# Patient Record
Sex: Male | Born: 1960 | Race: White | Hispanic: No | Smoking: Former smoker
Health system: Southern US, Community
[De-identification: ages and names within clinical notes are randomized; demographics above are authoritative.]

## PROBLEM LIST (undated history)

## (undated) DIAGNOSIS — R51 Headache: Secondary | ICD-10-CM

## (undated) DIAGNOSIS — I639 Cerebral infarction, unspecified: Secondary | ICD-10-CM

## (undated) DIAGNOSIS — M109 Gout, unspecified: Secondary | ICD-10-CM

## (undated) DIAGNOSIS — E119 Type 2 diabetes mellitus without complications: Secondary | ICD-10-CM

## (undated) DIAGNOSIS — R0600 Dyspnea, unspecified: Secondary | ICD-10-CM

## (undated) DIAGNOSIS — E039 Hypothyroidism, unspecified: Secondary | ICD-10-CM

## (undated) DIAGNOSIS — I10 Essential (primary) hypertension: Secondary | ICD-10-CM

## (undated) DIAGNOSIS — I749 Embolism and thrombosis of unspecified artery: Secondary | ICD-10-CM

## (undated) DIAGNOSIS — T8859XA Other complications of anesthesia, initial encounter: Secondary | ICD-10-CM

## (undated) DIAGNOSIS — I219 Acute myocardial infarction, unspecified: Secondary | ICD-10-CM

## (undated) DIAGNOSIS — M1990 Unspecified osteoarthritis, unspecified site: Secondary | ICD-10-CM

## (undated) DIAGNOSIS — Z9989 Dependence on other enabling machines and devices: Secondary | ICD-10-CM

## (undated) DIAGNOSIS — R519 Headache, unspecified: Secondary | ICD-10-CM

## (undated) DIAGNOSIS — G4733 Obstructive sleep apnea (adult) (pediatric): Secondary | ICD-10-CM

## (undated) DIAGNOSIS — I739 Peripheral vascular disease, unspecified: Secondary | ICD-10-CM

## (undated) DIAGNOSIS — I2699 Other pulmonary embolism without acute cor pulmonale: Secondary | ICD-10-CM

## (undated) DIAGNOSIS — T4145XA Adverse effect of unspecified anesthetic, initial encounter: Secondary | ICD-10-CM

## (undated) DIAGNOSIS — J40 Bronchitis, not specified as acute or chronic: Secondary | ICD-10-CM

## (undated) HISTORY — PX: SKIN GRAFT: SHX250

## (undated) HISTORY — PX: ROTATOR CUFF REPAIR: SHX139

---

## 2006-06-29 ENCOUNTER — Encounter: Payer: Self-pay | Admitting: Gastroenterology

## 2006-06-30 ENCOUNTER — Encounter: Payer: Self-pay | Admitting: Gastroenterology

## 2006-07-12 ENCOUNTER — Encounter: Payer: Self-pay | Admitting: Gastroenterology

## 2008-05-16 HISTORY — PX: IVC FILTER INSERTION: CATH118245

## 2009-03-06 ENCOUNTER — Encounter: Payer: Self-pay | Admitting: Gastroenterology

## 2009-07-01 ENCOUNTER — Encounter: Payer: Self-pay | Admitting: Gastroenterology

## 2009-07-20 ENCOUNTER — Encounter: Payer: Self-pay | Admitting: Gastroenterology

## 2009-07-30 ENCOUNTER — Encounter: Payer: Self-pay | Admitting: Gastroenterology

## 2009-08-13 ENCOUNTER — Encounter: Payer: Self-pay | Admitting: Gastroenterology

## 2009-08-17 ENCOUNTER — Encounter: Payer: Self-pay | Admitting: Gastroenterology

## 2009-08-20 ENCOUNTER — Encounter: Payer: Self-pay | Admitting: Gastroenterology

## 2009-08-24 ENCOUNTER — Encounter: Payer: Self-pay | Admitting: Gastroenterology

## 2009-08-24 DIAGNOSIS — R1319 Other dysphagia: Secondary | ICD-10-CM | POA: Insufficient documentation

## 2009-09-01 ENCOUNTER — Encounter: Payer: Self-pay | Admitting: Gastroenterology

## 2009-09-01 ENCOUNTER — Ambulatory Visit (HOSPITAL_COMMUNITY): Admission: RE | Admit: 2009-09-01 | Discharge: 2009-09-01 | Payer: Self-pay | Admitting: Gastroenterology

## 2009-09-02 ENCOUNTER — Encounter: Payer: Self-pay | Admitting: Gastroenterology

## 2009-09-10 ENCOUNTER — Ambulatory Visit: Payer: Self-pay | Admitting: Cardiology

## 2009-09-14 DIAGNOSIS — E049 Nontoxic goiter, unspecified: Secondary | ICD-10-CM

## 2009-09-14 DIAGNOSIS — R933 Abnormal findings on diagnostic imaging of other parts of digestive tract: Secondary | ICD-10-CM | POA: Insufficient documentation

## 2009-09-17 ENCOUNTER — Ambulatory Visit (HOSPITAL_COMMUNITY): Admission: RE | Admit: 2009-09-17 | Discharge: 2009-09-17 | Payer: Self-pay | Admitting: Gastroenterology

## 2009-09-17 ENCOUNTER — Ambulatory Visit: Payer: Self-pay | Admitting: Gastroenterology

## 2009-09-18 ENCOUNTER — Encounter: Payer: Self-pay | Admitting: Gastroenterology

## 2009-10-06 ENCOUNTER — Encounter: Payer: Self-pay | Admitting: Gastroenterology

## 2009-11-25 ENCOUNTER — Encounter: Payer: Self-pay | Admitting: Gastroenterology

## 2010-02-02 ENCOUNTER — Ambulatory Visit (HOSPITAL_COMMUNITY): Admission: RE | Admit: 2010-02-02 | Discharge: 2010-02-02 | Payer: Self-pay | Admitting: Surgery

## 2010-02-03 ENCOUNTER — Inpatient Hospital Stay (HOSPITAL_COMMUNITY): Admission: RE | Admit: 2010-02-03 | Discharge: 2010-02-06 | Payer: Self-pay | Admitting: Surgery

## 2010-03-02 ENCOUNTER — Ambulatory Visit (HOSPITAL_COMMUNITY): Admission: RE | Admit: 2010-03-02 | Discharge: 2010-03-02 | Payer: Self-pay | Admitting: Surgery

## 2010-05-16 DIAGNOSIS — I219 Acute myocardial infarction, unspecified: Secondary | ICD-10-CM

## 2010-05-16 HISTORY — DX: Acute myocardial infarction, unspecified: I21.9

## 2010-05-31 ENCOUNTER — Inpatient Hospital Stay (HOSPITAL_COMMUNITY)
Admission: EM | Admit: 2010-05-31 | Discharge: 2010-06-03 | Payer: Self-pay | Attending: Internal Medicine | Admitting: Internal Medicine

## 2010-06-01 ENCOUNTER — Encounter (INDEPENDENT_AMBULATORY_CARE_PROVIDER_SITE_OTHER): Payer: Self-pay | Admitting: Internal Medicine

## 2010-06-02 LAB — LIPID PANEL
Cholesterol: 76 mg/dL (ref 0–200)
HDL: 29 mg/dL — ABNORMAL LOW (ref >39)
LDL Cholesterol: 28 mg/dL (ref 0–99)
Total CHOL/HDL Ratio: 2.6 RATIO
Triglycerides: 94 mg/dL (ref <150)
VLDL: 19 mg/dL (ref 0–40)

## 2010-06-02 LAB — CBC
HCT: 39.9 % (ref 39.0–52.0)
HCT: 39.5 % (ref 39.0–52.0)
Hemoglobin: 13.2 g/dL (ref 13.0–17.0)
Hemoglobin: 13.4 g/dL (ref 13.0–17.0)
MCH: 31 pg (ref 26.0–34.0)
MCH: 31.5 pg (ref 26.0–34.0)
MCHC: 33.1 g/dL (ref 30.0–36.0)
MCHC: 33.9 g/dL (ref 30.0–36.0)
MCV: 93.7 fL (ref 78.0–100.0)
MCV: 92.7 fL (ref 78.0–100.0)
Platelets: 124 10*3/uL — ABNORMAL LOW (ref 150–400)
Platelets: 135 10*3/uL — ABNORMAL LOW (ref 150–400)
RBC: 4.26 MIL/uL (ref 4.22–5.81)
RBC: 4.26 MIL/uL (ref 4.22–5.81)
RDW: 13.8 % (ref 11.5–15.5)
RDW: 13.8 % (ref 11.5–15.5)
WBC: 10.1 10*3/uL (ref 4.0–10.5)
WBC: 9.3 10*3/uL (ref 4.0–10.5)

## 2010-06-02 LAB — BLOOD GAS, ARTERIAL
Acid-Base Excess: 1.5 mmol/L (ref 0.0–2.0)
Bicarbonate: 25.5 mEq/L — ABNORMAL HIGH (ref 20.0–24.0)
Drawn by: 318431
O2 Content: 6 L/min
O2 Saturation: 98.1 %
Patient temperature: 98.6
TCO2: 26.8 mmol/L (ref 0–100)
pCO2 arterial: 40.5 mmHg (ref 35.0–45.0)
pH, Arterial: 7.416 (ref 7.350–7.450)
pO2, Arterial: 102 mmHg — ABNORMAL HIGH (ref 80.0–100.0)

## 2010-06-02 LAB — COMPREHENSIVE METABOLIC PANEL
ALT: 24 U/L (ref 0–53)
AST: 21 U/L (ref 0–37)
Albumin: 3.3 g/dL — ABNORMAL LOW (ref 3.5–5.2)
Alkaline Phosphatase: 61 U/L (ref 39–117)
BUN: 10 mg/dL (ref 6–23)
CO2: 24 mEq/L (ref 19–32)
Calcium: 8.5 mg/dL (ref 8.4–10.5)
Chloride: 107 mEq/L (ref 96–112)
Creatinine, Ser: 0.9 mg/dL (ref 0.4–1.5)
GFR calc Af Amer: >60 mL/min (ref >60)
GFR calc non Af Amer: >60 mL/min (ref >60)
Glucose, Bld: 93 mg/dL (ref 70–99)
Potassium: 4.3 mEq/L (ref 3.5–5.1)
Sodium: 140 mEq/L (ref 135–145)
Total Bilirubin: 0.5 mg/dL (ref 0.3–1.2)
Total Protein: 6.1 g/dL (ref 6.0–8.3)

## 2010-06-02 LAB — HEPARIN LEVEL (UNFRACTIONATED)
Heparin Unfractionated: 0.37 IU/mL (ref 0.30–0.70)
Heparin Unfractionated: 0.25 IU/mL — ABNORMAL LOW (ref 0.30–0.70)

## 2010-06-02 LAB — CARDIAC PANEL(CRET KIN+CKTOT+MB+TROPI)
CK, MB: 1.8 ng/mL (ref 0.3–4.0)
Relative Index: INVALID (ref 0.0–2.5)
Total CK: 50 U/L (ref 7–232)
Troponin I: 0.18 ng/mL — ABNORMAL HIGH (ref 0.00–0.06)

## 2010-06-02 LAB — PROTIME-INR
INR: 1.65 — ABNORMAL HIGH (ref 0.00–1.49)
INR: 1.6 — ABNORMAL HIGH (ref 0.00–1.49)
Prothrombin Time: 19.7 seconds — ABNORMAL HIGH (ref 11.6–15.2)
Prothrombin Time: 19.2 seconds — ABNORMAL HIGH (ref 11.6–15.2)

## 2010-06-02 LAB — MRSA PCR SCREENING: MRSA by PCR: NEGATIVE

## 2010-06-02 LAB — TSH: TSH: 8.188 u[IU]/mL — ABNORMAL HIGH (ref 0.350–4.500)

## 2010-06-07 ENCOUNTER — Telehealth (INDEPENDENT_AMBULATORY_CARE_PROVIDER_SITE_OTHER): Payer: Self-pay | Admitting: *Deleted

## 2010-06-07 LAB — BASIC METABOLIC PANEL
BUN: 11 mg/dL (ref 6–23)
CO2: 27 mEq/L (ref 19–32)
Calcium: 9.1 mg/dL (ref 8.4–10.5)
Chloride: 103 mEq/L (ref 96–112)
Creatinine, Ser: 1 mg/dL (ref 0.4–1.5)
GFR calc Af Amer: >60 mL/min (ref >60)
GFR calc non Af Amer: >60 mL/min (ref >60)
Glucose, Bld: 112 mg/dL — ABNORMAL HIGH (ref 70–99)
Potassium: 4.1 mEq/L (ref 3.5–5.1)
Sodium: 142 mEq/L (ref 135–145)

## 2010-06-07 LAB — CBC
HCT: 39.2 % (ref 39.0–52.0)
Hemoglobin: 13.2 g/dL (ref 13.0–17.0)
MCH: 30.9 pg (ref 26.0–34.0)
MCHC: 33.7 g/dL (ref 30.0–36.0)
MCV: 91.8 fL (ref 78.0–100.0)
Platelets: 141 10*3/uL — ABNORMAL LOW (ref 150–400)
RBC: 4.27 MIL/uL (ref 4.22–5.81)
RDW: 13.6 % (ref 11.5–15.5)
WBC: 9.2 10*3/uL (ref 4.0–10.5)

## 2010-06-07 LAB — HEPARIN LEVEL (UNFRACTIONATED)
Heparin Unfractionated: 0.38 IU/mL (ref 0.30–0.70)
Heparin Unfractionated: 0.74 IU/mL — ABNORMAL HIGH (ref 0.30–0.70)

## 2010-06-07 LAB — PROTIME-INR
INR: 1.65 — ABNORMAL HIGH (ref 0.00–1.49)
INR: 2.24 — ABNORMAL HIGH (ref 0.00–1.49)
Prothrombin Time: 19.7 seconds — ABNORMAL HIGH (ref 11.6–15.2)
Prothrombin Time: 24.9 seconds — ABNORMAL HIGH (ref 11.6–15.2)

## 2010-06-07 NOTE — Discharge Summary (Addendum)
NAMEDWAYNE, BULKLEY                ACCOUNT NO.:  [id number]  MEDICAL RECORD NO.:  [id number]          PATIENT TYPE:  INP  LOCATION:  2009                         FACILITY:  [facility]  PHYSICIAN:  Tarry Kos, MD       DATE OF BIRTH:  1960-08-28  DATE OF ADMISSION:  05/31/2010 DATE OF DISCHARGE:  06/03/2010                              DISCHARGE SUMMARY   DISCHARGE DIAGNOSES: 1. Acute bilateral pulmonary emboli. 2. Obstructive sleep apnea, continuous positive airway pressure     dependent. 3. History of multiple ventricular tachycardias in the past since the     age of 50 years old. 4. Medical noncompliance. 5. Morbid obesity.  HOSPITAL COURSE:  Mr. 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  He was transferred here to be evaluated by Pulmonology and Hematology; however, his INR was subtherapeutic on admission at 1.5, he occasionally skips his Coumadin dosing.  Also, another issues that he did not his INR monitored for several months due to not being able to afford his $20 co-pay with lab draws.  I have stressed the importance to Mr. Araque and his wife that he should never skip a Coumadin dose and also the importance of him following up with the INR checks.  Once his dosing is optimized, he should only need mostly INR checks.  However, he recently had surgery and has had follow up with other physician and he has been having to pay multiple $20 co- pays, which he does not been able to afford, and so he just quit getting INR check, because that is also a $20 co-pay.  He normally alternates 20 mg of Coumadin with 15 mg of Coumadin.  This has been resumed here.  He was initially placed on heparin drip.  This has been converted over to Lovenox.  He is familiar with Lovenox injection.  He has been stable. He has been  weaned off of oxygen.  He does have chronic respiratory rate failure.  He has got obstructive sleep apnea, requires CPAP at night.  PHYSICAL EXAMINATION:  VITAL SIGNS:  He has been afebrile.  His vital signs have been stable. GENERAL:  His 2-D echo did not reveal any right heart strain.  He is alert and oriented x4, in no apparent distress constantly. CARDIAC:  Regular rate and rhythm without murmur or rub. CHEST:  Clear to auscultation bilaterally.  No rhonchi, wheeze, or rales. ABDOMEN:  Soft, nontender, and nondistended.  Positive bowel sounds.  No hepatosplenomegaly. EXTREMITIES:  No clubbing, cyanosis, or edema. PSYCH:  Normal affect. NEURO:  No focal neurologic deficits.  He is being discharged home.  He is to get his INR checked with his primary care physician in [place].  He is to get his INR checked tomorrow and make followup appointment with his primary care physician either tomorrow or Monday.  He is to continue his Lovenox full dosing until his INR is between 2 and 3 for 2 consecutive  days, and today is 2.6.  He will need his INR checked tomorrow, if it is between 2 and 3. Tomorrow can be his last day of Lovenox.  He will be following up with Pulmonology within next month.  Follow up again with his primary care physician and he will also need followup with Heme/Onc in the next 1-2 months.  However, I am not sure how much would be left offer.  Mr. Calandra due to the fact that this is probably the cause of some medical noncompliance.  DISCHARGE MEDICATIONS:  He is being discharged on the following medications; 1. Aspirin 81 mg a day. 2. Percocet 5/325 mg 1-2 every 6 hours as needed for pain. 3. Lovenox 160 mg subcu twice a day until his INR is between 2-3 for     two consecutive days while on Coumadin. 4. Coumadin 20 mg alternating with 15 mg, yesterday he received 20 mg,     today he received 15 mg. 5. Allopurinol 300 mg daily. 6. Meloxicam 15 mg daily as needed. 7.  Ranitidine 300 mg twice a day. 8. Fexofenadine 180 mg q.p.m. 9. Colchicine 0.6 mg q.3 hours as needed. 10.HCTZ 25 mg daily. 11.Ibuprofen 800 mg 2 times a day as needed. 12.Albuterol neb inhaler every 4 hours as needed. 13.Synthroid 100 mcg daily. 14.Nexium 40 mg twice a day. 15.ReQuip 2 mg at bedtime.  His INR today is 2.24, INR yesterday was 1.65.  INR on June 01, 2010, was 1.6.  Again INR checked tomorrow.          ______________________________ Tarry Kos, MD     RD/MEDQ  D:  06/03/2010  T:  06/04/2010  Job:  [id number]  Electronically Signed by Eldridge Dace MD on 06/07/2010 01:56:47 PM

## 2010-06-15 NOTE — Miscellaneous (Signed)
Summary: [facility] Referral  Clinical Lists Changes  Orders: Added new Test order of [facility] ([facility]) - Signed

## 2010-06-15 NOTE — Letter (Signed)
Summary: New Encounter/Doug Shiflett MD  New Encounter/Doug Shiflett MD   Imported By: Sherian Rein 09/01/2009 14:00:29  _____________________________________________________________________  External Attachment:    Type:   Image     Comment:   External Document

## 2010-06-15 NOTE — Procedures (Signed)
Summary: EGD/Dough Shiflett MD  EGD/Dough Shiflett MD   Imported By: Sherian Rein 09/01/2009 13:53:55  _____________________________________________________________________  External Attachment:    Type:   Image     Comment:   External Document

## 2010-06-15 NOTE — Medication Information (Signed)
Summary: Esophagram/Douglas Ricky Stabs MD  Esophagram/Douglas Ricky Stabs MD   Imported By: Lester Burke 09/16/2009 10:50:47  _____________________________________________________________________  External Attachment:    Type:   Image     Comment:   External Document

## 2010-06-15 NOTE — Procedures (Signed)
Summary: EGD/Doug Shiflett MD  EGD/Doug Shiflett MD   Imported By: Sherian Rein 09/01/2009 13:51:54  _____________________________________________________________________  External Attachment:    Type:   Image     Comment:   External Document

## 2010-06-15 NOTE — Letter (Signed)
Summary: GI Consult/Doug Shiflett MD  GI Consult/Doug Shiflett MD   Imported By: Sherian Rein 09/01/2009 13:55:07  _____________________________________________________________________  External Attachment:    Type:   Image     Comment:   External Document

## 2010-06-15 NOTE — Miscellaneous (Signed)
Summary: Manometry order  Clinical Lists Changes  Problems: Added new problem of OTHER DYSPHAGIA (ICD-787.29) Orders: Added new Test order of Manometry (Manometry) - Signed  Appended Document: Manometry order Manometry completed. has elevated LES pressure 66 (10-45 is normal) has high residual pressure 12.4 (<8 is normal) 92% peristaltic contraction but many of these were just barely paristaltic.  Probably has achalasia or at least early achalasia.  given weight loss a bit out of proportion to his dysphagia symptoms, fairly recent onset of symptoms I will set up CT scan chest, abd with IV and oral contast to check for pseudoachalasia (can be from malignancy).  IF that checks out ok, then will set him up with surgeon to discuss myotomy.

## 2010-06-15 NOTE — Letter (Signed)
Summary: [facility]   Imported By: Sherian Rein 12/10/2009 11:00:19  _____________________________________________________________________  External Attachment:    Type:   Image     Comment:   External Document

## 2010-06-15 NOTE — Procedures (Signed)
Summary: Endoscopic Ultrasound  Patient: Frank Shelton Note: All result statuses are Final unless otherwise noted.  Tests: (1) Endoscopic Ultrasound (EUS)  EUS Endoscopic Ultrasound                             DONE     [facility]     [address] [place], [place]  [address]           ENDOSCOPIC ULTRASOUND PROCEDURE REPORT           PATIENT:  [name], [name]  MR#:  [id number]     BIRTHDATE:  April 12, 1961  GENDER:  male     ENDOSCOPIST:  Rachael Fee, MD     PROCEDURE DATE:  09/17/2009     PROCEDURE:  Upper EUS     ASA CLASS:  Class III     INDICATIONS:  dysphagia (UGI and manometry suggest achalasia, CT     scan done to check for "pseudoachalasia" showed large goiter and     thick GE junction with nearby, small lymphnode.     MEDICATIONS:   Fentanyl 37.5 mcg IV, Versed 3.5 mg IV     DESCRIPTION OF PROCEDURE:   After the risks, benefits, and     alternatives of the procedure were thoroughly explained, informed     consent was obtained.  The  endoscope was introduced through the     mouth  and advanced to the distal stomach.           <<PROCEDUREIMAGES>>           Endoscopic findings:     1. GE junction was clearly snug but allowed passage of radial     echoendoscope with mild to moderate resistence.  The mucosa was     completely normal appearing.     2. Normal stomach.           EUS findings:     1. Normal echolayering of esophagus, and proximal stomach.  No     sign of discrete mass.     2. Irregular, indisinct, 6mm, heterogenous paraesophageal     lymphnode that appears benign, reactive.           Impression:     UGI, endosdocopy and manometry all suggest achalasia.  He will be     seeing Dr. Ardyth Harps tomorrow for large goiter.  It is unlikely     that the goiter is causing any esophageal symptoms and if Dr.     Evlyn Kanner agrees to that, then we will be setting up Lyda Jester to see a     surgeon to consider Heller myotomy.        ______________________________     Rachael Fee, MD           cc: Laurene Footman, MD           n.     Rosalie Doctor:   Rachael Fee at 09/17/2009 03:48 PM           [name], [id number]  Note: An exclamation mark (!) indicates a result that was not dispersed into the flowsheet. Document Creation Date: 09/17/2009 3:48 PM _______________________________________________________________________  (1) Order result status: Final Collection or observation date-time: 09/17/2009 15:40 Requested date-time:  Receipt date-time:  Reported date-time:  Referring Physician:   Ordering Physician: Rob Bunting [phone]) Specimen Source:  Source: Launa Grill Order Number: [id number] Lab site:   Appended  Document: Endoscopic Ultrasound received faxed note from Dr. Evlyn Kanner.  Doubtful that the goiter is causing his dysphagia (and his abnormal esophageal manometry).  Elnita Maxwell, can you set him up to see Dr. Wenda Low or Dr. Caryn Section about lap Heller myotomy.  Appended Document: Endoscopic Ultrasound Message left for [facility] to call me re: surgical consult.  Appended Document: Endoscopic Ultrasound Appt. scheduled for 11/25/09 with Dr.Martin  at 2:10 and pt. to be there at 1:45 p.m.Wife notified.

## 2010-06-15 NOTE — Miscellaneous (Signed)
Summary: CT order  Clinical Lists Changes  Orders: Added new Referral order of CT Chest/Abdomen w IV and Oral Contrast (CT CH/ABD w IV/Oral ) - Signed Added new Test order of T-CT Pelvis w/contrast ([address]) - Signed

## 2010-06-15 NOTE — Procedures (Signed)
Summary: EGD/Doug Shiflett MD  EGD/Doug Shiflett MD   Imported By: Sherian Rein 09/01/2009 13:59:02  _____________________________________________________________________  External Attachment:    Type:   Image     Comment:   External Document

## 2010-06-15 NOTE — Letter (Signed)
Summary: Dessie Coma MD  Dessie Coma MD   Imported By: Sherian Rein 09/01/2009 13:52:57  _____________________________________________________________________  External Attachment:    Type:   Image     Comment:   External Document

## 2010-06-15 NOTE — Procedures (Signed)
Summary: Manometry/[place]  Manometry/[place]   Imported By: Lester Upland 09/10/2009 07:19:20  _____________________________________________________________________  External Attachment:    Type:   Image     Comment:   External Document

## 2010-06-15 NOTE — Procedures (Signed)
Summary: Colonoscopy/Doug Shiflett MD  Colonoscopy/Doug Shiflett MD   Imported By: Sherian Rein 09/01/2009 13:57:38  _____________________________________________________________________  External Attachment:    Type:   Image     Comment:   External Document

## 2010-06-15 NOTE — Consult Note (Signed)
Summary: [facility]  [facility]   Imported By: Sherian Rein 10/13/2009 08:33:19  _____________________________________________________________________  External Attachment:    Type:   Image     Comment:   External Document

## 2010-06-17 NOTE — Progress Notes (Signed)
Summary: hfu sched 2/20 at 2:12  ---- Converted from flag ---- ---- 06/07/2010 7:06 AM, Coralyn Helling MD wrote: Can you schedule HFU for Frank Shelton in next 3 to 4 weeks.  Thanks. ------------------------------  Phone Note Outgoing Call   Call placed by: Carver Fila,  June 07, 2010 9:02 AM Call placed to: Patient Summary of Call: [email or web address] [name]  June 07, 2010 9:02 AM   Follow-up for Phone Call        pt callled back and states he will gets his wife to call and make the apt. Will await her call back. Went ahead and sheduled pt for 06/28/10 at 4:14 p.m. Pt canl reschedule if can't make this date. Carver Fila  June 07, 2010 11:22 AM   called and spoke with pt wife. Pt wife carpool's to work so due to that he couldn't come in until 2/20 at 2:15 p.m. with Dr. Craige Cotta for HFU.  Carver Fila  June 08, 2010 11:18 AM

## 2010-06-28 ENCOUNTER — Inpatient Hospital Stay: Payer: Self-pay | Admitting: Pulmonary Disease

## 2010-07-05 ENCOUNTER — Inpatient Hospital Stay: Payer: Self-pay | Admitting: Pulmonary Disease

## 2010-07-15 ENCOUNTER — Inpatient Hospital Stay: Payer: Self-pay | Admitting: Pulmonary Disease

## 2010-07-29 LAB — CBC
HCT: 37.2 % — ABNORMAL LOW (ref 39.0–52.0)
HCT: 41.1 % (ref 39.0–52.0)
HCT: 40.9 % (ref 39.0–52.0)
Hemoglobin: 13.8 g/dL (ref 13.0–17.0)
Hemoglobin: 14.7 g/dL (ref 13.0–17.0)
MCH: 32 pg (ref 26.0–34.0)
MCH: 32.1 pg (ref 26.0–34.0)
MCH: 32.3 pg (ref 26.0–34.0)
MCHC: 34.2 g/dL (ref 30.0–36.0)
MCV: 94.9 fL (ref 78.0–100.0)
MCV: 95.2 fL (ref 78.0–100.0)
MCV: 93.6 fL (ref 78.0–100.0)
Platelets: 153 10*3/uL (ref 150–400)
Platelets: 162 10*3/uL (ref 150–400)
RBC: 4.32 MIL/uL (ref 4.22–5.81)
RBC: 4.58 MIL/uL (ref 4.22–5.81)
RDW: 13.6 % (ref 11.5–15.5)
RDW: 13.8 % (ref 11.5–15.5)
WBC: 12.9 10*3/uL — ABNORMAL HIGH (ref 4.0–10.5)
WBC: 7.1 10*3/uL (ref 4.0–10.5)
WBC: 8.2 10*3/uL (ref 4.0–10.5)

## 2010-07-29 LAB — COMPREHENSIVE METABOLIC PANEL
ALT: 25 U/L (ref 0–53)
AST: 23 U/L (ref 0–37)
Albumin: 3.9 g/dL (ref 3.5–5.2)
Alkaline Phosphatase: 69 U/L (ref 39–117)
CO2: 26 mEq/L (ref 19–32)
Chloride: 105 mEq/L (ref 96–112)
Creatinine, Ser: 0.9 mg/dL (ref 0.4–1.5)
GFR calc Af Amer: >60 mL/min (ref >60)
GFR calc non Af Amer: >60 mL/min (ref >60)
Potassium: 3.9 mEq/L (ref 3.5–5.1)
Total Bilirubin: 0.9 mg/dL (ref 0.3–1.2)

## 2010-07-29 LAB — DIFFERENTIAL
Basophils Absolute: 0.1 10*3/uL (ref 0.0–0.1)
Basophils Absolute: 0 10*3/uL (ref 0.0–0.1)
Basophils Relative: 1 % (ref 0–1)
Eosinophils Absolute: 0 10*3/uL (ref 0.0–0.7)
Eosinophils Absolute: 0.2 10*3/uL (ref 0.0–0.7)
Eosinophils Absolute: 0.2 10*3/uL (ref 0.0–0.7)
Eosinophils Relative: 0 % (ref 0–5)
Eosinophils Relative: 3 % (ref 0–5)
Eosinophils Relative: 2 % (ref 0–5)
Lymphocytes Relative: 12 % (ref 12–46)
Lymphocytes Relative: 33 % (ref 12–46)
Lymphs Abs: 1.5 10*3/uL (ref 0.7–4.0)
Monocytes Absolute: 0.9 10*3/uL (ref 0.1–1.0)
Monocytes Absolute: 1.1 10*3/uL — ABNORMAL HIGH (ref 0.1–1.0)
Monocytes Absolute: 0.6 10*3/uL (ref 0.1–1.0)
Monocytes Relative: 9 % (ref 3–12)

## 2010-07-29 LAB — BASIC METABOLIC PANEL
BUN: 11 mg/dL (ref 6–23)
Chloride: 107 mEq/L (ref 96–112)
Creatinine, Ser: 0.89 mg/dL (ref 0.4–1.5)
GFR calc non Af Amer: >60 mL/min (ref >60)
Glucose, Bld: 87 mg/dL (ref 70–99)
Potassium: 4.2 mEq/L (ref 3.5–5.1)

## 2010-07-29 LAB — HEPATIC FUNCTION PANEL
Bilirubin, Direct: 0.1 mg/dL (ref 0.0–0.3)
Indirect Bilirubin: 0.5 mg/dL (ref 0.3–0.9)
Total Protein: 7.3 g/dL (ref 6.0–8.3)

## 2010-07-29 LAB — APTT: aPTT: 34 seconds (ref 24–37)

## 2010-07-29 LAB — PROTIME-INR: Prothrombin Time: 14.8 seconds (ref 11.6–15.2)

## 2010-07-29 LAB — FACTOR 5 LEIDEN

## 2010-07-29 LAB — SURGICAL PCR SCREEN
MRSA, PCR: NEGATIVE
Staphylococcus aureus: NEGATIVE

## 2010-07-29 LAB — ANTITHROMBIN III: AntiThromb III Func: 105 % (ref 76–126)

## 2010-07-30 DIAGNOSIS — J449 Chronic obstructive pulmonary disease, unspecified: Secondary | ICD-10-CM | POA: Insufficient documentation

## 2010-07-30 DIAGNOSIS — G4733 Obstructive sleep apnea (adult) (pediatric): Secondary | ICD-10-CM | POA: Insufficient documentation

## 2010-07-30 DIAGNOSIS — I10 Essential (primary) hypertension: Secondary | ICD-10-CM | POA: Insufficient documentation

## 2010-07-30 DIAGNOSIS — K219 Gastro-esophageal reflux disease without esophagitis: Secondary | ICD-10-CM | POA: Insufficient documentation

## 2010-07-30 DIAGNOSIS — M109 Gout, unspecified: Secondary | ICD-10-CM | POA: Insufficient documentation

## 2010-07-30 DIAGNOSIS — J309 Allergic rhinitis, unspecified: Secondary | ICD-10-CM | POA: Insufficient documentation

## 2010-07-30 DIAGNOSIS — E039 Hypothyroidism, unspecified: Secondary | ICD-10-CM | POA: Insufficient documentation

## 2010-07-30 DIAGNOSIS — Z86718 Personal history of other venous thrombosis and embolism: Secondary | ICD-10-CM | POA: Insufficient documentation

## 2010-07-30 DIAGNOSIS — G2581 Restless legs syndrome: Secondary | ICD-10-CM | POA: Insufficient documentation

## 2010-07-30 DIAGNOSIS — J4489 Other specified chronic obstructive pulmonary disease: Secondary | ICD-10-CM | POA: Insufficient documentation

## 2010-08-02 ENCOUNTER — Encounter: Payer: Self-pay | Admitting: Pulmonary Disease

## 2010-08-02 ENCOUNTER — Inpatient Hospital Stay (INDEPENDENT_AMBULATORY_CARE_PROVIDER_SITE_OTHER): Payer: Medicare PPO | Admitting: Pulmonary Disease

## 2010-08-02 DIAGNOSIS — G4733 Obstructive sleep apnea (adult) (pediatric): Secondary | ICD-10-CM

## 2010-08-02 DIAGNOSIS — R209 Unspecified disturbances of skin sensation: Secondary | ICD-10-CM | POA: Insufficient documentation

## 2010-08-02 DIAGNOSIS — Z86718 Personal history of other venous thrombosis and embolism: Secondary | ICD-10-CM

## 2010-08-02 DIAGNOSIS — J45909 Unspecified asthma, uncomplicated: Secondary | ICD-10-CM | POA: Insufficient documentation

## 2010-08-12 NOTE — Assessment & Plan Note (Signed)
Summary: post hosp f/u per [name]   Primary Provider/Referring Provider:  Dr. Byrd Hesselbach in [place]  CC:  HFU. Pt states he feels some better. Still gets sob w/ activity. Pt states he has very little cough w/ occas dark brown phlem. Frank Shelton  History of Present Illness: 50 yo male with OSA, PE, Morbid obesity.  His breathing better.  He is no longer having chest pain.  He gets occasional cough with clear sputum.  He has chronic leg swelling, but no worse than usual.  He has noticed tingling in his hands.  He is using his CPAP every night.  His coumadin level is followed by primary care in [place].  Preventive Screening-Counseling & Management  Alcohol-Tobacco     Smoking Status: quit  Current Medications (verified): 1)  Requip 2 Mg Tabs (Ropinirole Hcl) .Frank Shelton.. 1 Once Daily 2)  Nexium 40 Mg Cpdr (Esomeprazole Magnesium) .Frank Shelton.. 1 Two Times A Day 3)  Synthroid 50 Mcg Tabs (Levothyroxine Sodium) .... Once Daily At Breakfast 4)  Ibuprofen 800 Mg Tabs (Ibuprofen) .Frank Shelton.. 1 Three Times A Day As Needed 5)  Hydrochlorothiazide 25 Mg Tabs (Hydrochlorothiazide) .Frank Shelton.. 1 Once Daily As Needed 6)  Colcrys 0.6 Mg Tabs (Colchicine) .Frank Shelton.. 1 Every 3 Hrs As Needed For Pain 7)  Fexofenadine Hcl 180 Mg Tabs (Fexofenadine Hcl) .Frank Shelton.. 1 Once Daily 8)  Ranitidine Hcl 300 Mg Caps (Ranitidine Hcl) .Frank Shelton.. 1 Two Times A Day 9)  Allopurinol 300 Mg Tabs (Allopurinol) .Frank Shelton.. 1 Once Daily 10)  Aspirin 81 Mg Tbec (Aspirin) .Frank Shelton.. 1 Once Daily 11)  Percocet 5-325 Mg Tabs (Oxycodone-Acetaminophen) .Frank Shelton.. 1 To 2 Every 6 Hrs As Needed 12)  Meloxicam 15 Mg Tabs (Meloxicam) .Frank Shelton.. 1 Once Daily 13)  Coumadin 10 Mg Tabs (Warfarin Sodium) .... Take As Directed 14)  Albuterol Sulfate (2.5 Mg/59ml) 0.083% Nebu (Albuterol Sulfate) .... As Needed 15)  Serevent Diskus 50 Mcg/dose Aepb (Salmeterol Xinafoate) .... 2 Puffs As Needed  Allergies (verified): 1)  ! * Nabumetone 2)  ! Relafen  Past History:  Past Medical History: Asthma Chronic  Bronchitis Phlebitis GERD OSA Cellulitis DVT/PE Hypothyroidism Hypertension Gout Restless leg syndrome Rhinitis Morbid obesity  Past Surgical History: Achalasia surgery Skin grafts Tonsillectomy  Family History: Reviewed history from 07/30/2010 and no changes required. Hx of blood clots- Mother and MGM asthma: 2 sons MI: father and mother  Social History: Reviewed history from 07/30/2010 and no changes required. Married Social ETOH Patient states former smoker. quit 2010. 1/2 ppd. started age 61. occupation: disabled Smoking Status:  quit  Vital Signs:  Patient profile:   50 year old male Height:      74 inches Weight:      364.50 pounds BMI:     46.97 O2 Sat:      98 % on Room air Temp:     97.5 degrees F oral Pulse rate:   68 / minute BP sitting:   124 / 62  (left arm) Cuff size:   regular  Vitals Entered By: Carver Fila (August 02, 2010 10:39 AM)  O2 Flow:  Room air CC: HFU. Pt states he feels some better. Still gets sob w/ activity. Pt states he has very little cough w/ occas dark brown phlem.  Comments meds and allergies updated Phone number updated Carver Fila  August 02, 2010 10:40 AM    Physical Exam  General:  normal appearance, healthy appearing, and obese.   Eyes:  PERRLA and EOMI.   Nose:  no deformity, discharge, inflammation, or  lesions Mouth:  MP 4, no exudate Neck:  no JVD.   Lungs:  clear bilaterally to auscultation and percussion Heart:  regular rate and rhythm, S1, S2 without murmurs, rubs, gallops, or clicks Extremities:  chronic venous stasis changes Neurologic:  normal CN II-XII.   Cervical Nodes:  no significant adenopathy   Impression & Recommendations:  Problem # 1:  OBSTRUCTIVE SLEEP APNEA (ICD-327.23) Will get a copy of his CPAP download and call with results.  If okay, he can f/u with primary care.  Problem # 2:  PULMONARY EMBOLISM, HX OF (ICD-V12.51) Coumadin per primary care.  Problem # 3:  ASTHMA (ICD-493.90) He  is followed by primary care in [place].  Problem # 4:  NUMBNESS, HAND (ICD-782.0) He has noticed b/l hand tingling for the past several weeks.  He has normal strength.  I have advised him to discuss with primary care.  Medications Added to Medication List This Visit: 1)  Synthroid 50 Mcg Tabs (Levothyroxine sodium) .... Once daily at breakfast 2)  Ibuprofen 800 Mg Tabs (Ibuprofen) .Frank Shelton.. 1 three times a day as needed 3)  Hydrochlorothiazide 25 Mg Tabs (Hydrochlorothiazide) .Frank Shelton.. 1 once daily as needed 4)  Meloxicam 15 Mg Tabs (Meloxicam) .Frank Shelton.. 1 once daily 5)  Albuterol Sulfate (2.5 Mg/29ml) 0.083% Nebu (Albuterol sulfate) .... As needed 6)  Serevent Diskus 50 Mcg/dose Aepb (Salmeterol xinafoate) .... 2 puffs as needed  Complete Medication List: 1)  Requip 2 Mg Tabs (Ropinirole hcl) .Frank Shelton.. 1 once daily 2)  Nexium 40 Mg Cpdr (Esomeprazole magnesium) .Frank Shelton.. 1 two times a day 3)  Synthroid 50 Mcg Tabs (Levothyroxine sodium) .... Once daily at breakfast 4)  Ibuprofen 800 Mg Tabs (Ibuprofen) .Frank Shelton.. 1 three times a day as needed 5)  Hydrochlorothiazide 25 Mg Tabs (Hydrochlorothiazide) .Frank Shelton.. 1 once daily as needed 6)  Colcrys 0.6 Mg Tabs (Colchicine) .Frank Shelton.. 1 every 3 hrs as needed for pain 7)  Fexofenadine Hcl 180 Mg Tabs (Fexofenadine hcl) .Frank Shelton.. 1 once daily 8)  Ranitidine Hcl 300 Mg Caps (Ranitidine hcl) .Frank Shelton.. 1 two times a day 9)  Allopurinol 300 Mg Tabs (Allopurinol) .Frank Shelton.. 1 once daily 10)  Aspirin 81 Mg Tbec (Aspirin) .Frank Shelton.. 1 once daily 11)  Percocet 5-325 Mg Tabs (Oxycodone-acetaminophen) .Frank Shelton.. 1 to 2 every 6 hrs as needed 12)  Meloxicam 15 Mg Tabs (Meloxicam) .Frank Shelton.. 1 once daily 13)  Coumadin 10 Mg Tabs (Warfarin sodium) .... Take as directed 14)  Albuterol Sulfate (2.5 Mg/85ml) 0.083% Nebu (Albuterol sulfate) .... As needed 15)  Serevent Diskus 50 Mcg/dose Aepb (Salmeterol xinafoate) .... 2 puffs as needed  Other Orders: Est. Patient Level III (41324) DME Referral (DME)  Patient Instructions: 1)   Follow up as needed    Immunization History:  Influenza Immunization History:    Influenza:  historical (01/14/2010)  Pneumovax Immunization History:    Pneumovax:  historical (01/14/2010)

## 2012-01-11 ENCOUNTER — Encounter (HOSPITAL_COMMUNITY): Payer: Self-pay

## 2012-01-11 ENCOUNTER — Inpatient Hospital Stay (HOSPITAL_COMMUNITY)
Admission: EM | Admit: 2012-01-11 | Discharge: 2012-01-14 | DRG: 300 | Disposition: A | Discharge status: Home / Self Care | Payer: Medicare PPO | Attending: Internal Medicine | Admitting: Internal Medicine

## 2012-01-11 DIAGNOSIS — L0390 Cellulitis, unspecified: Secondary | ICD-10-CM

## 2012-01-11 DIAGNOSIS — O2230 Deep phlebothrombosis in pregnancy, unspecified trimester: Secondary | ICD-10-CM

## 2012-01-11 DIAGNOSIS — I82509 Chronic embolism and thrombosis of unspecified deep veins of unspecified lower extremity: Secondary | ICD-10-CM | POA: Diagnosis present

## 2012-01-11 DIAGNOSIS — R791 Abnormal coagulation profile: Secondary | ICD-10-CM | POA: Diagnosis present

## 2012-01-11 DIAGNOSIS — J45909 Unspecified asthma, uncomplicated: Secondary | ICD-10-CM | POA: Diagnosis present

## 2012-01-11 DIAGNOSIS — D72829 Elevated white blood cell count, unspecified: Secondary | ICD-10-CM | POA: Diagnosis present

## 2012-01-11 DIAGNOSIS — I82409 Acute embolism and thrombosis of unspecified deep veins of unspecified lower extremity: Principal | ICD-10-CM | POA: Diagnosis present

## 2012-01-11 DIAGNOSIS — L03116 Cellulitis of left lower limb: Secondary | ICD-10-CM | POA: Diagnosis present

## 2012-01-11 DIAGNOSIS — Z7901 Long term (current) use of anticoagulants: Secondary | ICD-10-CM

## 2012-01-11 DIAGNOSIS — Z86711 Personal history of pulmonary embolism: Secondary | ICD-10-CM

## 2012-01-11 DIAGNOSIS — E039 Hypothyroidism, unspecified: Secondary | ICD-10-CM | POA: Diagnosis present

## 2012-01-11 DIAGNOSIS — K219 Gastro-esophageal reflux disease without esophagitis: Secondary | ICD-10-CM | POA: Diagnosis present

## 2012-01-11 DIAGNOSIS — Z79899 Other long term (current) drug therapy: Secondary | ICD-10-CM

## 2012-01-11 DIAGNOSIS — L02419 Cutaneous abscess of limb, unspecified: Secondary | ICD-10-CM | POA: Diagnosis present

## 2012-01-11 DIAGNOSIS — M109 Gout, unspecified: Secondary | ICD-10-CM | POA: Diagnosis present

## 2012-01-11 DIAGNOSIS — I10 Essential (primary) hypertension: Secondary | ICD-10-CM | POA: Diagnosis present

## 2012-01-11 DIAGNOSIS — Z86718 Personal history of other venous thrombosis and embolism: Secondary | ICD-10-CM

## 2012-01-11 DIAGNOSIS — D6832 Hemorrhagic disorder due to extrinsic circulating anticoagulants: Secondary | ICD-10-CM

## 2012-01-11 DIAGNOSIS — Z6841 Body Mass Index (BMI) 40.0 and over, adult: Secondary | ICD-10-CM

## 2012-01-11 DIAGNOSIS — G4733 Obstructive sleep apnea (adult) (pediatric): Secondary | ICD-10-CM | POA: Diagnosis present

## 2012-01-11 HISTORY — DX: Gout, unspecified: M10.9

## 2012-01-11 HISTORY — DX: Embolism and thrombosis of unspecified artery: I74.9

## 2012-01-11 HISTORY — DX: Other pulmonary embolism without acute cor pulmonale: I26.99

## 2012-01-11 LAB — PROTIME-INR
INR: 1.25 (ref 0.00–1.49)
Prothrombin Time: 16 seconds — ABNORMAL HIGH (ref 11.6–15.2)

## 2012-01-11 LAB — CBC WITH DIFFERENTIAL/PLATELET
Basophils Absolute: 0 10*3/uL (ref 0.0–0.1)
Eosinophils Relative: 0 % (ref 0–5)
Lymphocytes Relative: 9 % — ABNORMAL LOW (ref 12–46)
Lymphs Abs: 1.3 10*3/uL (ref 0.7–4.0)
Neutro Abs: 13.1 10*3/uL — ABNORMAL HIGH (ref 1.7–7.7)
Neutrophils Relative %: 85 % — ABNORMAL HIGH (ref 43–77)
Platelets: 167 10*3/uL (ref 150–400)
RBC: 4.37 MIL/uL (ref 4.22–5.81)
RDW: 14.5 % (ref 11.5–15.5)
WBC: 15.3 10*3/uL — ABNORMAL HIGH (ref 4.0–10.5)

## 2012-01-11 LAB — BASIC METABOLIC PANEL
CO2: 28 mEq/L (ref 19–32)
Calcium: 9.3 mg/dL (ref 8.4–10.5)
Chloride: 100 mEq/L (ref 96–112)
Potassium: 3.7 mEq/L (ref 3.5–5.1)
Sodium: 135 mEq/L (ref 135–145)

## 2012-01-11 LAB — D-DIMER, QUANTITATIVE: D-Dimer, Quant: 0.89 ug/mL-FEU — ABNORMAL HIGH (ref 0.00–0.48)

## 2012-01-11 MED ORDER — VANCOMYCIN HCL IN DEXTROSE 1-5 GM/200ML-% IV SOLN
1000.0000 mg | Freq: Once | INTRAVENOUS | Status: AC
  Administered 2012-01-12: 1000 mg via INTRAVENOUS
  Filled 2012-01-11: qty 200

## 2012-01-11 MED ORDER — CEFAZOLIN SODIUM 1-5 GM-% IV SOLN
1.0000 g | Freq: Once | INTRAVENOUS | Status: AC
  Administered 2012-01-11: 1 g via INTRAVENOUS
  Filled 2012-01-11: qty 50

## 2012-01-11 MED ORDER — HYDROMORPHONE HCL PF 2 MG/ML IJ SOLN
2.0000 mg | Freq: Once | INTRAMUSCULAR | Status: AC
  Administered 2012-01-11: 2 mg via INTRAVENOUS
  Filled 2012-01-11: qty 1

## 2012-01-11 MED ORDER — ONDANSETRON HCL 4 MG/2ML IJ SOLN
4.0000 mg | Freq: Once | INTRAMUSCULAR | Status: AC
  Administered 2012-01-11: 4 mg via INTRAVENOUS
  Filled 2012-01-11: qty 2

## 2012-01-11 NOTE — ED Notes (Addendum)
Measured calf left 62.0cm & right calf 60.0cm.
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
## 2012-01-11 NOTE — ED Notes (Signed)
Left lower ext, red & hot. Pt states feels like he has one in each upper thigh also.

## 2012-01-11 NOTE — ED Notes (Signed)
Pt w/ hx of blood clots. Pt on coumadin. Last PTT/INR done 2 months ago.

## 2012-01-11 NOTE — ED Notes (Signed)
Pt now nauseated & dry heaving.

## 2012-01-11 NOTE — ED Notes (Signed)
Possible blood clots in both legs per pt. History of the same and history of PE per pt. Left lower leg red and hot to the touch per spouse. Left leg started hurting today around 2pm per pt.

## 2012-01-12 ENCOUNTER — Inpatient Hospital Stay (HOSPITAL_COMMUNITY): Payer: Medicare PPO

## 2012-01-12 ENCOUNTER — Encounter (HOSPITAL_COMMUNITY): Payer: Self-pay | Admitting: *Deleted

## 2012-01-12 DIAGNOSIS — L03116 Cellulitis of left lower limb: Secondary | ICD-10-CM | POA: Diagnosis present

## 2012-01-12 DIAGNOSIS — I82409 Acute embolism and thrombosis of unspecified deep veins of unspecified lower extremity: Principal | ICD-10-CM

## 2012-01-12 LAB — HEMOGLOBIN A1C: Mean Plasma Glucose: 128 mg/dL — ABNORMAL HIGH (ref <117)

## 2012-01-12 LAB — PROTIME-INR
INR: 1.4 (ref 0.00–1.49)
Prothrombin Time: 17.4 seconds — ABNORMAL HIGH (ref 11.6–15.2)

## 2012-01-12 MED ORDER — ALBUTEROL SULFATE HFA 108 (90 BASE) MCG/ACT IN AERS
2.0000 | INHALATION_SPRAY | Freq: Four times a day (QID) | RESPIRATORY_TRACT | Status: DC
  Administered 2012-01-12 – 2012-01-14 (×7): 2 via RESPIRATORY_TRACT
  Filled 2012-01-12: qty 6.7

## 2012-01-12 MED ORDER — LEVOTHYROXINE SODIUM 100 MCG PO TABS
100.0000 ug | ORAL_TABLET | Freq: Every day | ORAL | Status: DC
  Administered 2012-01-12 – 2012-01-14 (×3): 100 ug via ORAL
  Filled 2012-01-12 (×3): qty 1

## 2012-01-12 MED ORDER — ONDANSETRON HCL 4 MG PO TABS: 4.0000 mg | ORAL_TABLET | Freq: Four times a day (QID) | ORAL | Status: DC | PRN

## 2012-01-12 MED ORDER — WARFARIN - PHARMACIST DOSING INPATIENT: Freq: Every day | Status: DC

## 2012-01-12 MED ORDER — SODIUM CHLORIDE 0.9 % IV SOLN
INTRAVENOUS | Status: AC
  Administered 2012-01-12: 03:00:00 via INTRAVENOUS

## 2012-01-12 MED ORDER — ALUM & MAG HYDROXIDE-SIMETH 200-200-20 MG/5ML PO SUSP: 30.0000 mL | Freq: Four times a day (QID) | ORAL | Status: DC | PRN

## 2012-01-12 MED ORDER — ACETAMINOPHEN 325 MG PO TABS: 650.0000 mg | ORAL_TABLET | Freq: Four times a day (QID) | ORAL | Status: DC | PRN

## 2012-01-12 MED ORDER — OXYCODONE HCL 5 MG PO TABS
5.0000 mg | ORAL_TABLET | ORAL | Status: DC | PRN
  Administered 2012-01-14: 5 mg via ORAL
  Filled 2012-01-12: qty 1

## 2012-01-12 MED ORDER — ACETAMINOPHEN 650 MG RE SUPP: 650.0000 mg | Freq: Four times a day (QID) | RECTAL | Status: DC | PRN

## 2012-01-12 MED ORDER — VANCOMYCIN HCL IN DEXTROSE 1-5 GM/200ML-% IV SOLN
1000.0000 mg | Freq: Three times a day (TID) | INTRAVENOUS | Status: AC
  Administered 2012-01-12 – 2012-01-13 (×4): 1000 mg via INTRAVENOUS
  Filled 2012-01-12 (×4): qty 200

## 2012-01-12 MED ORDER — ONDANSETRON HCL 4 MG/2ML IJ SOLN: 4.0000 mg | Freq: Four times a day (QID) | INTRAMUSCULAR | Status: DC | PRN

## 2012-01-12 MED ORDER — ENOXAPARIN SODIUM 40 MG/0.4ML ~~LOC~~ SOLN
173.0000 mg | Freq: Once | SUBCUTANEOUS | Status: AC
  Administered 2012-01-12: 170 mg via SUBCUTANEOUS
  Filled 2012-01-12: qty 0.4

## 2012-01-12 MED ORDER — HYDROMORPHONE HCL PF 1 MG/ML IJ SOLN
0.5000 mg | INTRAMUSCULAR | Status: DC | PRN
  Administered 2012-01-12 – 2012-01-13 (×8): 0.5 mg via INTRAVENOUS
  Filled 2012-01-12 (×8): qty 1

## 2012-01-12 MED ORDER — VANCOMYCIN HCL 1000 MG IV SOLR
1500.0000 mg | Freq: Once | INTRAVENOUS | Status: AC
  Administered 2012-01-12: 1500 mg via INTRAVENOUS
  Filled 2012-01-12: qty 1500

## 2012-01-12 MED ORDER — POLYETHYLENE GLYCOL 3350 17 G PO PACK: 17.0000 g | PACK | Freq: Every day | ORAL | Status: DC | PRN

## 2012-01-12 MED ORDER — WARFARIN SODIUM 7.5 MG PO TABS
15.0000 mg | ORAL_TABLET | Freq: Once | ORAL | Status: AC
  Administered 2012-01-12: 15 mg via ORAL
  Filled 2012-01-12: qty 2

## 2012-01-12 MED ORDER — ALBUTEROL SULFATE HFA 108 (90 BASE) MCG/ACT IN AERS: 2.0000 | INHALATION_SPRAY | Freq: Four times a day (QID) | RESPIRATORY_TRACT | Status: DC

## 2012-01-12 MED ORDER — ALLOPURINOL 300 MG PO TABS
300.0000 mg | ORAL_TABLET | Freq: Every day | ORAL | Status: DC
  Administered 2012-01-12 – 2012-01-14 (×3): 300 mg via ORAL
  Filled 2012-01-12 (×3): qty 1

## 2012-01-12 NOTE — Progress Notes (Addendum)
Patient seen and examined by me.  See H&P by Dr. Phillips Odor.  Continue abx for LE cellulitis.  Pain controlled.  Says not on heart healthy diet at home- eats regular food.  Continue coumadin to goal of 2-3.  On lovenox tx dose.  Marlin Canary DO

## 2012-01-12 NOTE — Consult Note (Signed)
ANTIBIOTIC CONSULT NOTE - INITIAL  Pharmacy Consult for Vancomycin Indication: cellulitis  Allergies  Allergen Reactions  . Nabumetone Hives   Patient Measurements: Height: 6\' 4"  (193 cm) Weight: 383 lb (173.728 kg) IBW/kg (Calculated) : 86.8   Vital Signs: Temp: 99.4 F (37.4 C) (08/29 1478) Temp src: Oral (08/29 0633) BP: 133/71 mmHg (08/29 2956) Pulse Rate: 93  (08/29 0633) Intake/Output from previous day: 08/28 0701 - 08/29 0700 In: -  Out: 350 [Urine:350] Intake/Output from this shift:    Labs:  [facility] 01/11/12 2125  WBC 15.3*  HGB 13.8  PLT 167  LABCREA --  CREATININE 0.96   Estimated Creatinine Clearance: 153.1 ml/min (by C-G formula based on Cr of 0.96). No results found for this basename: VANCOTROUGH:2,VANCOPEAK:2,VANCORANDOM:2,GENTTROUGH:2,GENTPEAK:2,GENTRANDOM:2,TOBRATROUGH:2,TOBRAPEAK:2,TOBRARND:2,AMIKACINPEAK:2,AMIKACINTROU:2,AMIKACIN:2, in the last 72 hours   Microbiology: Recent Results (from the past 720 hour(s))  CULTURE, BLOOD (ROUTINE X 2)     Status: Normal (Preliminary result)   Collection Time   01/12/12  2:43 AM      Component Value Range Status Comment   Specimen Description BLOOD RIGHT HAND   Final    Special Requests BOTTLES DRAWN AEROBIC AND ANAEROBIC 5CC EACH   Final    Culture PENDING   Incomplete    Report Status PENDING   Incomplete   CULTURE, BLOOD (ROUTINE X 2)     Status: Normal (Preliminary result)   Collection Time   01/12/12  2:43 AM      Component Value Range Status Comment   Specimen Description BLOOD RIGHT ANTECUBITAL   Final    Special Requests BOTTLES DRAWN AEROBIC AND ANAEROBIC [facility] EACH   Final    Culture PENDING   Incomplete    Report Status PENDING   Incomplete     Medical History: Past Medical History  Diagnosis Date  . Pulmonary embolism   . Embolism - blood clot   . Gout   . Sleep apnea    Medications:  Scheduled:    . sodium chloride   Intravenous STAT  . albuterol  2 puff Inhalation QID  .  allopurinol  300 mg Oral Daily  . ceFAZolin  1 g Intravenous Once  . enoxaparin  170 mg Subcutaneous Once  .  HYDROmorphone (DILAUDID) injection  2 mg Intravenous Once  . levothyroxine  100 mcg Oral QAC breakfast  . ondansetron  4 mg Intravenous Once  . vancomycin  1,500 mg Intravenous Once  . vancomycin  1,000 mg Intravenous Once   Assessment: 51yo morbidly obese male c/o LE pain and swelling.  Good renal fxn. Estimated Creatinine Clearance: 153.1 ml/min (by C-G formula based on Cr of 0.96).  Goal of Therapy:  Vancomycin trough level 10-15 mcg/ml  Plan: Vancomycin 1gm iv q8hrs Check trough at steady state Monitor labs, renal fxn, and cultures per protocol  Valrie Hart A 01/12/2012,7:54 AM
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
## 2012-01-12 NOTE — Consult Note (Addendum)
ANTICOAGULATION CONSULT NOTE - Initial Consult  Pharmacy Consult for Warfarin Indication: pulmonary embolism, DVT  Allergies  Allergen Reactions  . Nabumetone Hives   Patient Measurements: Height: 6\' 4"  (193 cm) Weight: 383 lb (173.728 kg) IBW/kg (Calculated) : 86.8   Vital Signs: Temp: 99.4 F (37.4 C) (08/29 0633) Temp src: Oral (08/29 0633) BP: 133/71 mmHg (08/29 1610) Pulse Rate: 93  (08/29 0633)  Labs:  Basename 01/12/12 0243 01/11/12 2125  HGB -- 13.8  HCT -- 41.3  PLT -- 167  APTT -- 35  LABPROT 17.4* 16.0*  INR 1.40 1.25  HEPARINUNFRC -- --  CREATININE -- 0.96  CKTOTAL -- --  CKMB -- --  TROPONINI -- --   Estimated Creatinine Clearance: 153.1 ml/min (by C-G formula based on Cr of 0.96).  Medical History: Past Medical History  Diagnosis Date  . Pulmonary embolism   . Embolism - blood clot   . Gout   . Sleep apnea    Medications:  Prescriptions prior to admission  Medication Sig Dispense Refill  . albuterol (PROVENTIL HFA;VENTOLIN HFA) 108 (90 BASE) MCG/ACT inhaler Inhale 2 puffs into the lungs every 6 (six) hours as needed. FOR SHORTNESS OF BREATH      . allopurinol (ZYLOPRIM) 300 MG tablet Take 300 mg by mouth daily.      . colchicine 0.6 MG tablet Take 0.6 mg by mouth daily as needed. For gout flare/pain      . Enoxaparin Sodium (LOVENOX IJ) Inject 1 each as directed once.      . hydrochlorothiazide (HYDRODIURIL) 25 MG tablet Take 25 mg by mouth daily as needed. For fluid retention      . HYDROcodone-acetaminophen (NORCO) 7.5-325 MG per tablet Take 1 tablet by mouth daily as needed. For shoulder pain      . levothyroxine (SYNTHROID, LEVOTHROID) 100 MCG tablet Take 100 mcg by mouth daily.      Marland Kitchen warfarin (COUMADIN) 10 MG tablet Take 10-15 mg by mouth daily. Alternate taking 10 mg on one day and 15 mg on the next day as directed       Assessment: 51 yo morbidly obese male with h/o DVT, PE.  Home coumadin dose noted as above.  INR sub-therapeutic on  admission.  Goal of Therapy:  INR 2-3 Monitor platelets by anticoagulation protocol: Yes   Plan: Coumadin 15mg  po today x 1 INR daily CBC per protocol  Margo Aye, Jennifr Gaeta A 01/12/2012,9:27 AM

## 2012-01-12 NOTE — Care Management Note (Unsigned)
    Page 1 of 1   01/13/2012     11:34:03 AM   CARE MANAGEMENT NOTE 01/13/2012  Patient:  Frank Shelton, Frank Shelton   Account Number:  [id number]  Date Initiated:  01/12/2012  Documentation initiated by:  Rosemary Holms  Subjective/Objective Assessment:   Pt admitted with DVT in L. leg. Lives at home with his wife. Currently up in chair. Pt states he knows how to give himself levonox injections.     Action/Plan:   After speaking with pt and spouse, no DC needs identified.   Anticipated DC Date:  01/13/2012   Anticipated DC Plan:  HOME/SELF CARE      DC Planning Services  CM consult      Choice offered to / List presented to:             Status of service:  In process, will continue to follow Medicare Important Message given?   (If response is "NO", the following Medicare IM given date fields will be blank) Date Medicare IM given:   Date Additional Medicare IM given:    Discharge Disposition:    Per UR Regulation:    If discussed at Long Length of Stay Meetings, dates discussed:    Comments:  01/13/12 1130 Norell Brisbin RN BSN CM Xarelto savings Card left in pt's chart. Informed Tamika RN and informed pt and wife. Informed them that they would need to activate care using the instructions and take it to their pharmacy. $10 copay a month with the savings card.  01/12/12 1445 Jesper Stirewalt Leanord Hawking RN BSN CM

## 2012-01-12 NOTE — Progress Notes (Signed)
UR Chart Review Completed  

## 2012-01-12 NOTE — Progress Notes (Signed)
Patient has own home cpap unit in room in place 93% room air

## 2012-01-12 NOTE — Consult Note (Signed)
ANTIBIOTIC CONSULT NOTE-Preliminary  Pharmacy Consult for Vancomycin Indication:Cellulitis  Allergies  Allergen Reactions  . Nabumetone Hives    Patient Measurements: Height: 6\' 4"  (193 cm) Weight: 383 lb (173.728 kg) IBW/kg (Calculated) : 86.8    Vital Signs: Temp: 100.5 F (38.1 C) (08/29 0220) Temp src: Oral (08/29 0220) BP: 142/78 mmHg (08/29 0220) Pulse Rate: 90  (08/29 0220)  Labs:  Alvira Philips 01/11/12 2125  WBC 15.3*  HGB 13.8  PLT 167  LABCREA --  CREATININE 0.96    Estimated Creatinine Clearance: 153.1 ml/min (by C-G formula based on Cr of 0.96).  No results found for this basename: VANCOTROUGH:2,VANCOPEAK:2,VANCORANDOM:2,GENTTROUGH:2,GENTPEAK:2,GENTRANDOM:2,TOBRATROUGH:2,TOBRAPEAK:2,TOBRARND:2,AMIKACINPEAK:2,AMIKACINTROU:2,AMIKACIN:2, in the last 72 hours   Microbiology: No results found for this or any previous visit (from the past 720 hour(s)).  Medical History: Past Medical History  Diagnosis Date  . Pulmonary embolism   . Embolism - blood clot   . Gout   . Sleep apnea     Medications:  Vancomycin 1 Gm IV in ED Cefazolin 1 Gm IV in Ed  Assessment: This patient is a 51 yo morbidly obese male who will be given vancomycin empirically for a lower left extremity cellulitis.   Goal of Therapy:  Vancomycin troughs 10-15 mg/dl   Plan:  Preliminary review of pertinent patient information completed.  Protocol will be initiated with a one-time dose(s) of Vancomycin 1500 mg IV in addition to the initial 1000 mg dose given in the ED, for a total of 2500 Mg.  Jeani Hawking clinical pharmacist will complete review during morning rounds to assess patient and finalize treatment regimen.  Arelia Sneddon, [facility] 01/12/2012,2:44 AM

## 2012-01-13 DIAGNOSIS — E039 Hypothyroidism, unspecified: Secondary | ICD-10-CM

## 2012-01-13 DIAGNOSIS — L0390 Cellulitis, unspecified: Secondary | ICD-10-CM

## 2012-01-13 LAB — CBC
Hemoglobin: 11.9 g/dL — ABNORMAL LOW (ref 13.0–17.0)
MCH: 31.3 pg (ref 26.0–34.0)
MCHC: 33.1 g/dL (ref 30.0–36.0)
Platelets: 134 10*3/uL — ABNORMAL LOW (ref 150–400)
RBC: 3.8 MIL/uL — ABNORMAL LOW (ref 4.22–5.81)

## 2012-01-13 LAB — BASIC METABOLIC PANEL
CO2: 26 mEq/L (ref 19–32)
Calcium: 8.5 mg/dL (ref 8.4–10.5)
GFR calc non Af Amer: >90 mL/min (ref >90)
Glucose, Bld: 121 mg/dL — ABNORMAL HIGH (ref 70–99)
Potassium: 4.1 mEq/L (ref 3.5–5.1)
Sodium: 136 mEq/L (ref 135–145)

## 2012-01-13 LAB — PROTIME-INR
INR: 1.18 (ref 0.00–1.49)
Prothrombin Time: 15.3 seconds — ABNORMAL HIGH (ref 11.6–15.2)

## 2012-01-13 MED ORDER — VANCOMYCIN HCL IN DEXTROSE 1-5 GM/200ML-% IV SOLN
1000.0000 mg | Freq: Four times a day (QID) | INTRAVENOUS | Status: DC
  Administered 2012-01-13 – 2012-01-14 (×3): 1000 mg via INTRAVENOUS
  Filled 2012-01-13 (×12): qty 200

## 2012-01-13 MED ORDER — RIVAROXABAN 10 MG PO TABS: 15.0000 mg | ORAL_TABLET | Freq: Two times a day (BID) | ORAL | Status: DC

## 2012-01-13 MED ORDER — RIVAROXABAN 10 MG PO TABS
15.0000 mg | ORAL_TABLET | Freq: Two times a day (BID) | ORAL | Status: DC
  Administered 2012-01-13 – 2012-01-14 (×3): 15 mg via ORAL
  Filled 2012-01-13 (×4): qty 2

## 2012-01-13 NOTE — Progress Notes (Addendum)
[facility] PROGRESS NOTE  Frank Shelton RUE:[id number] DOB: 26-Jan-1961 DOA: 01/11/2012 PCP: No primary provider on file.  Assessment/Plan: Principal Problem:  *DVT (deep venous thrombosis) Active Problems:  HYPOTHYROIDISM  Morbid obesity  OBSTRUCTIVE SLEEP APNEA  HYPERTENSION  Cellulitis of left lower extremity  1. DVT: change from lovenox/coumadin to xarelto (15 mg PO BID x 21 days then 20 mg daily)- ask care manager to see for help with medication, has had recurrent DVT/PE, with IVC filter placement, requiring large doses of coumadin to become therapeutic, ultrasound was non-occlusive 2. Hypothyroid- synthroid 3. OSA- c-pap at night 4. HTN- stable 5. Leukocytosis- better with abx- ?change to PO tomm/sunday and D/C home    Code Status: full Family Communication: wife on phone Disposition Plan: home in a few days    HPI/Subjective: Still with pain in left LE No fevers, no chills    Objective: Filed Vitals:   01/12/12 2146 01/12/12 2200 01/13/12 0540 01/13/12 0801  BP: 159/76 117/70 110/63   Pulse: 87 82 83   Temp: 98.6 F (37 C) 99.2 F (37.3 C) 99.7 F (37.6 C)   TempSrc: Oral Oral Oral   Resp: 18 18 18    Height:      Weight:      SpO2: 93% 97% 93% 96%    Intake/Output Summary (Last 24 hours) at 01/13/12 0803 Last data filed at 01/12/12 2201  Gross per 24 hour  Intake   1333 ml  Output   1000 ml  Net    333 ml   Filed Weights   01/11/12 2009 01/12/12 0220  Weight: 173.274 kg (382 lb) 173.728 kg (383 lb)    Exam:   General:  A+Ox3, NAD  Cardiovascular: clear  Respiratory: rrr  Abdomen: +Bs, obese  Ext: redness and swelling still in left LE, pulses intact  Data Reviewed: Basic Metabolic Panel:  Lab 01/13/12 1478 01/11/12 2125  NA 136 135  K 4.1 3.7  CL 104 100  CO2 26 28  GLUCOSE 121* 164*  BUN 10 14  CREATININE 0.81 0.96  CALCIUM 8.5 9.3  MG -- --  PHOS -- --   Liver Function Tests: No results found for this basename:  AST:5,ALT:5,ALKPHOS:5,BILITOT:5,PROT:5,ALBUMIN:5 in the last 168 hours No results found for this basename: LIPASE:5,AMYLASE:5 in the last 168 hours No results found for this basename: AMMONIA:5 in the last 168 hours CBC:  Lab 01/13/12 0503 01/11/12 2125  WBC 8.5 15.3*  NEUTROABS -- 13.1*  HGB 11.9* 13.8  HCT 35.9* 41.3  MCV 94.5 94.5  PLT 134* 167   Cardiac Enzymes: No results found for this basename: CKTOTAL:5,CKMB:5,CKMBINDEX:5,TROPONINI:5 in the last 168 hours BNP (last 3 results) No results found for this basename: PROBNP:3 in the last 8760 hours CBG: No results found for this basename: GLUCAP:5 in the last 168 hours  Recent Results (from the past 240 hour(s))  CULTURE, BLOOD (ROUTINE X 2)     Status: Normal (Preliminary result)   Collection Time   01/12/12  2:43 AM      Component Value Range Status Comment   Specimen Description BLOOD RIGHT HAND   Final    Special Requests BOTTLES DRAWN AEROBIC AND ANAEROBIC 5CC EACH   Final    Culture NO GROWTH <24 HRS   Final    Report Status PENDING   Incomplete   CULTURE, BLOOD (ROUTINE X 2)     Status: Normal (Preliminary result)   Collection Time   01/12/12  2:43 AM  Component Value Range Status Comment   Specimen Description BLOOD RIGHT ANTECUBITAL   Final    Special Requests BOTTLES DRAWN AEROBIC AND ANAEROBIC 6CC EACH   Final    Culture NO GROWTH <24 HRS   Final    Report Status PENDING   Incomplete      Studies: US Venous Img Lower Unilateral Left  01/12/2012  *RADIOLOGY REPORT*  Clinical data:  Obese patient presenting with left lower extremity cellulitis.  Left lower extremity pain, edema.  Prior history of DVT and pulmonary embolus.  Indwelling IVC filter.  LEFT LOWER EXTREMITY VENOUS DOPPLER ULTRASOUND 01/12/2012:  Technique:  Gray-scale sonography with compression, as well as color and duplex Doppler ultrasound, were performed to evaluate the deep venous system from the level of the common femoral vein through the  popliteal and proximal calf veins. Evaluation also included physiologic response to augmentation.  Comparison: None.  Findings: Nonocclusive gray scale filling defect along the wall of the left popliteal vein, making this vein incompletely compressible.  Flow is present within the partially occluded vein, however.  All of the remaining deep veins in the left lower extremity demonstrate normal compressibility, normal color Doppler flow, and normal physiologic response to augmentation.  The greater saphenous vein is patent through its visualized course in the leg.  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  No evidence of DVT elsewhere.   Original Report Authenticated By: Arnell Sieving, M.D.     Scheduled Meds:   . sodium chloride   Intravenous STAT  . albuterol  2 puff Inhalation QID  . allopurinol  300 mg Oral Daily  . levothyroxine  100 mcg Oral QAC breakfast  . rivaroxaban  15 mg Oral BID  . vancomycin  1,000 mg Intravenous Q8H  . warfarin  15 mg Oral ONCE-1800  . DISCONTD: albuterol  2 puff Inhalation QID  . DISCONTD: rivaroxaban  15 mg Oral BID  . DISCONTD: Warfarin - Pharmacist Dosing Inpatient   Does not apply q1800   Continuous Infusions:   Principal Problem:  *DVT (deep venous thrombosis) Active Problems:  HYPOTHYROIDISM  Morbid obesity  OBSTRUCTIVE SLEEP APNEA  HYPERTENSION  Cellulitis of left lower extremity    Time spent: 25 min    Marlin Canary  [facility] Pager [phone] 01/13/2012, 8:03 AM  LOS: 2 days

## 2012-01-13 NOTE — Consult Note (Signed)
ANTIBIOTIC CONSULT NOTE   Pharmacy Consult for Vancomycin Indication: cellulitis  Allergies  Allergen Reactions  . Nabumetone Hives   Patient Measurements: Height: 6\' 4"  (193 cm) Weight: 383 lb (173.728 kg) IBW/kg (Calculated) : 86.8   Vital Signs: Temp: 99.7 F (37.6 C) (08/30 0540) Temp src: Oral (08/30 0540) BP: 110/63 mmHg (08/30 0540) Pulse Rate: 83  (08/30 0540) Intake/Output from previous day: 08/29 0701 - 08/30 0700 In: 1333 [P.O.:480; I.V.:853] Out: 1000 [Urine:1000] Intake/Output from this shift: Total I/O In: 360 [P.O.:360] Out: -   Labs:  Basename 01/13/12 0503 01/11/12 2125  WBC 8.5 15.3*  HGB 11.9* 13.8  PLT 134* 167  LABCREA -- --  CREATININE 0.81 0.96   Estimated Creatinine Clearance: 181.4 ml/min (by C-G formula based on Cr of 0.81).  Basename 01/13/12 0848  VANCOTROUGH 7.6*  VANCOPEAK --  Drue Dun --  GENTTROUGH --  GENTPEAK --  GENTRANDOM --  TOBRATROUGH --  Nolen Mu --  TOBRARND --  AMIKACINPEAK --  AMIKACINTROU --  AMIKACIN --    Microbiology: Recent Results (from the past 720 hour(s))  CULTURE, BLOOD (ROUTINE X 2)     Status: Normal (Preliminary result)   Collection Time   01/12/12  2:43 AM      Component Value Range Status Comment   Specimen Description BLOOD RIGHT HAND   Final    Special Requests BOTTLES DRAWN AEROBIC AND ANAEROBIC 5CC EACH   Final    Culture NO GROWTH 1 DAY   Final    Report Status PENDING   Incomplete   CULTURE, BLOOD (ROUTINE X 2)     Status: Normal (Preliminary result)   Collection Time   01/12/12  2:43 AM      Component Value Range Status Comment   Specimen Description BLOOD RIGHT ANTECUBITAL   Final    Special Requests BOTTLES DRAWN AEROBIC AND ANAEROBIC 6CC EACH   Final    Culture NO GROWTH 1 DAY   Final    Report Status PENDING   Incomplete    Medical History: Past Medical History  Diagnosis Date  . Pulmonary embolism   . Embolism - blood clot   . Gout   . Sleep apnea    Medications:    Scheduled:     . sodium chloride   Intravenous STAT  . albuterol  2 puff Inhalation QID  . allopurinol  300 mg Oral Daily  . levothyroxine  100 mcg Oral QAC breakfast  . rivaroxaban  15 mg Oral BID  . vancomycin  1,000 mg Intravenous Q8H  . warfarin  15 mg Oral ONCE-1800  . DISCONTD: albuterol  2 puff Inhalation QID  . DISCONTD: rivaroxaban  15 mg Oral BID  . DISCONTD: Warfarin - Pharmacist Dosing Inpatient   Does not apply q1800   Assessment: 51yo morbidly obese male c/o LE pain and swelling.  Good renal fxn.  Trough level below goal. Estimated Creatinine Clearance: 181.4 ml/min (by C-G formula based on Cr of 0.81). Excellent Vancomycin clearance and large Vd  Goal of Therapy:  Vancomycin trough level 10-15 mcg/ml  Plan: Change Vancomycin to 1gm iv q6hrs Check trough weekly while on Vancomycin Monitor labs, renal fxn, and cultures per protocol  Margo Aye, Ayodele Sangalang A 01/13/2012,10:11 AM

## 2012-01-13 NOTE — Progress Notes (Signed)
UR Chart Review Completed  

## 2012-01-13 NOTE — Progress Notes (Signed)
Xaeralto prescription card given to pt wife per his request to carry home.

## 2012-01-14 DIAGNOSIS — I10 Essential (primary) hypertension: Secondary | ICD-10-CM

## 2012-01-14 LAB — CBC
HCT: 36.3 % — ABNORMAL LOW (ref 39.0–52.0)
Hemoglobin: 12.3 g/dL — ABNORMAL LOW (ref 13.0–17.0)
MCHC: 33.9 g/dL (ref 30.0–36.0)
MCV: 94 fL (ref 78.0–100.0)

## 2012-01-14 LAB — BASIC METABOLIC PANEL
BUN: 9 mg/dL (ref 6–23)
Chloride: 101 mEq/L (ref 96–112)
Creatinine, Ser: 0.81 mg/dL (ref 0.50–1.35)
GFR calc non Af Amer: >90 mL/min (ref >90)
Glucose, Bld: 115 mg/dL — ABNORMAL HIGH (ref 70–99)
Potassium: 3.9 mEq/L (ref 3.5–5.1)

## 2012-01-14 LAB — PROTIME-INR: INR: 1.84 — ABNORMAL HIGH (ref 0.00–1.49)

## 2012-01-14 MED ORDER — RIVAROXABAN 15 MG PO TABS: 15.0000 mg | ORAL_TABLET | Freq: Two times a day (BID) | ORAL | Status: DC

## 2012-01-14 MED ORDER — RIVAROXABAN 20 MG PO TABS: 20.0000 mg | ORAL_TABLET | Freq: Every day | ORAL | Status: DC

## 2012-01-14 MED ORDER — FUROSEMIDE 20 MG PO TABS: 20.0000 mg | ORAL_TABLET | Freq: Every day | ORAL | Status: DC | PRN

## 2012-01-14 MED ORDER — SODIUM CHLORIDE 0.9 % IJ SOLN
INTRAMUSCULAR | Status: AC
  Administered 2012-01-14: 12:00:00
  Filled 2012-01-14: qty 3

## 2012-01-14 MED ORDER — DOXYCYCLINE HYCLATE 100 MG PO TABS: 100.0000 mg | ORAL_TABLET | Freq: Two times a day (BID) | ORAL | Status: AC

## 2012-01-14 MED ORDER — POTASSIUM CHLORIDE ER 10 MEQ PO TBCR: 10.0000 meq | EXTENDED_RELEASE_TABLET | Freq: Every day | ORAL | Status: DC | PRN

## 2012-01-14 MED ORDER — POLYETHYLENE GLYCOL 3350 17 G PO PACK: 17.0000 g | PACK | Freq: Every day | ORAL | Status: AC | PRN

## 2012-01-14 NOTE — Progress Notes (Signed)
Notified Dr. Blake Divine due to the patient stating that the [facility] prescription assistance card that was given to them yesterday is for commercial insurance and not covered by [facility] which is the insurance coverage he has. I also notified Isla Pence, Rn to ask if we could assist the patient with 2 day supply until he could speak with his PCP on Tuesday.  She gave the directions about calling [place] [facility] about the pricing.  After talking with Nehemiah Settle at [facility] she states that 4 tablets would be 56.77.  This pricing was relayed back to whinnie who wrote out a voucher.  MD gave appropriate prescription and order to send tonites dose home with him.  This information was passed on to the oncoming nurse Marijean Niemann, RN.

## 2012-01-14 NOTE — Progress Notes (Signed)
Pt given d/c instructions and new prescriptions.  Pt given the voucher to get his rivaroxaban dose as ordered.  Pt also sent home with 15mg  dose of rivaroxaban  With instructions to take tonight at ten pm.  Discussed home care with patient and his daughter and discussed home medications, patient and daughter verbalize understanding, teachback completed. F/U appointment with Dr. Byrd Hesselbach in [place], [place] to be made by the patient,  pt states he will make and keep appointment. Pt is stable at this time. Pt taken to main entrance by staff member, refused wheelchair, ambulated with no difficulty.
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
## 2012-01-14 NOTE — Progress Notes (Signed)
Notified Dr. Blake Divine about the patient not having an IV access.  She states that the patient is most likely going home and will not need the IV vancomycin.

## 2012-01-17 LAB — CULTURE, BLOOD (ROUTINE X 2)

## 2012-05-16 DIAGNOSIS — I639 Cerebral infarction, unspecified: Secondary | ICD-10-CM

## 2012-05-16 HISTORY — DX: Cerebral infarction, unspecified: I63.9

## 2012-05-16 HISTORY — PX: HELLER MYOTOMY: SHX5259

## 2014-01-09 ENCOUNTER — Emergency Department (HOSPITAL_COMMUNITY): Payer: Medicare PPO

## 2014-01-09 ENCOUNTER — Observation Stay (HOSPITAL_COMMUNITY)
Admission: EM | Admit: 2014-01-09 | Discharge: 2014-01-10 | Disposition: A | Discharge status: Home / Self Care | Payer: Medicare PPO | Attending: Internal Medicine | Admitting: Internal Medicine

## 2014-01-09 ENCOUNTER — Encounter (HOSPITAL_COMMUNITY): Payer: Self-pay | Admitting: Emergency Medicine

## 2014-01-09 DIAGNOSIS — R0789 Other chest pain: Secondary | ICD-10-CM | POA: Diagnosis not present

## 2014-01-09 DIAGNOSIS — E119 Type 2 diabetes mellitus without complications: Secondary | ICD-10-CM | POA: Insufficient documentation

## 2014-01-09 DIAGNOSIS — R079 Chest pain, unspecified: Secondary | ICD-10-CM | POA: Insufficient documentation

## 2014-01-09 DIAGNOSIS — R0609 Other forms of dyspnea: Secondary | ICD-10-CM

## 2014-01-09 DIAGNOSIS — R0602 Shortness of breath: Secondary | ICD-10-CM | POA: Diagnosis not present

## 2014-01-09 DIAGNOSIS — Z87891 Personal history of nicotine dependence: Secondary | ICD-10-CM

## 2014-01-09 DIAGNOSIS — G4730 Sleep apnea, unspecified: Secondary | ICD-10-CM | POA: Insufficient documentation

## 2014-01-09 DIAGNOSIS — I10 Essential (primary) hypertension: Secondary | ICD-10-CM | POA: Diagnosis not present

## 2014-01-09 DIAGNOSIS — Z86711 Personal history of pulmonary embolism: Secondary | ICD-10-CM | POA: Insufficient documentation

## 2014-01-09 DIAGNOSIS — M109 Gout, unspecified: Secondary | ICD-10-CM | POA: Insufficient documentation

## 2014-01-09 DIAGNOSIS — Z8249 Family history of ischemic heart disease and other diseases of the circulatory system: Secondary | ICD-10-CM

## 2014-01-09 DIAGNOSIS — J449 Chronic obstructive pulmonary disease, unspecified: Secondary | ICD-10-CM

## 2014-01-09 DIAGNOSIS — Z86718 Personal history of other venous thrombosis and embolism: Secondary | ICD-10-CM

## 2014-01-09 DIAGNOSIS — E049 Nontoxic goiter, unspecified: Secondary | ICD-10-CM | POA: Diagnosis present

## 2014-01-09 DIAGNOSIS — Z79899 Other long term (current) drug therapy: Secondary | ICD-10-CM | POA: Diagnosis not present

## 2014-01-09 DIAGNOSIS — K219 Gastro-esophageal reflux disease without esophagitis: Secondary | ICD-10-CM | POA: Diagnosis present

## 2014-01-09 DIAGNOSIS — Z91040 Latex allergy status: Secondary | ICD-10-CM | POA: Insufficient documentation

## 2014-01-09 DIAGNOSIS — G4733 Obstructive sleep apnea (adult) (pediatric): Secondary | ICD-10-CM | POA: Diagnosis present

## 2014-01-09 DIAGNOSIS — K220 Achalasia of cardia: Secondary | ICD-10-CM

## 2014-01-09 DIAGNOSIS — D6859 Other primary thrombophilia: Secondary | ICD-10-CM | POA: Insufficient documentation

## 2014-01-09 DIAGNOSIS — R112 Nausea with vomiting, unspecified: Secondary | ICD-10-CM

## 2014-01-09 DIAGNOSIS — I82409 Acute embolism and thrombosis of unspecified deep veins of unspecified lower extremity: Secondary | ICD-10-CM | POA: Diagnosis present

## 2014-01-09 DIAGNOSIS — J45909 Unspecified asthma, uncomplicated: Secondary | ICD-10-CM | POA: Diagnosis present

## 2014-01-09 HISTORY — DX: Type 2 diabetes mellitus without complications: E11.9

## 2014-01-09 HISTORY — DX: Essential (primary) hypertension: I10

## 2014-01-09 LAB — BASIC METABOLIC PANEL
ANION GAP: 13 (ref 5–15)
BUN: 16 mg/dL (ref 6–23)
CHLORIDE: 101 meq/L (ref 96–112)
CO2: 24 mEq/L (ref 19–32)
Calcium: 9.1 mg/dL (ref 8.4–10.5)
Creatinine, Ser: 0.93 mg/dL (ref 0.50–1.35)
Glucose, Bld: 118 mg/dL — ABNORMAL HIGH (ref 70–99)
POTASSIUM: 4.2 meq/L (ref 3.7–5.3)
SODIUM: 138 meq/L (ref 137–147)

## 2014-01-09 LAB — CBC
HEMATOCRIT: 41.4 % (ref 39.0–52.0)
Hemoglobin: 14 g/dL (ref 13.0–17.0)
MCH: 31.9 pg (ref 26.0–34.0)
MCHC: 33.8 g/dL (ref 30.0–36.0)
MCV: 94.3 fL (ref 78.0–100.0)
PLATELETS: 182 10*3/uL (ref 150–400)
RBC: 4.39 MIL/uL (ref 4.22–5.81)
RDW: 13.7 % (ref 11.5–15.5)
WBC: 7.9 10*3/uL (ref 4.0–10.5)

## 2014-01-09 LAB — TROPONIN I: Troponin I: <0.3 ng/mL (ref <0.30)

## 2014-01-09 LAB — PRO B NATRIURETIC PEPTIDE: PRO B NATRI PEPTIDE: 211.2 pg/mL — AB (ref 0–125)

## 2014-01-09 MED ORDER — IOHEXOL 350 MG/ML SOLN
120.0000 mL | Freq: Once | INTRAVENOUS | Status: AC | PRN
  Administered 2014-01-09: 120 mL via INTRAVENOUS

## 2014-01-09 MED ORDER — MORPHINE SULFATE 2 MG/ML IJ SOLN: 2.0000 mg | INTRAMUSCULAR | Status: DC | PRN

## 2014-01-09 MED ORDER — ACETAMINOPHEN 650 MG RE SUPP: 650.0000 mg | Freq: Four times a day (QID) | RECTAL | Status: DC | PRN

## 2014-01-09 MED ORDER — POTASSIUM CHLORIDE CRYS ER 10 MEQ PO TBCR
10.0000 meq | EXTENDED_RELEASE_TABLET | Freq: Two times a day (BID) | ORAL | Status: DC
  Administered 2014-01-09 – 2014-01-10 (×2): 10 meq via ORAL
  Filled 2014-01-09 (×2): qty 1

## 2014-01-09 MED ORDER — NEBIVOLOL HCL 2.5 MG PO TABS
5.0000 mg | ORAL_TABLET | Freq: Every day | ORAL | Status: DC
  Administered 2014-01-09: 5 mg via ORAL
  Filled 2014-01-09: qty 1
  Filled 2014-01-09 (×3): qty 2
  Filled 2014-01-09: qty 1

## 2014-01-09 MED ORDER — ACETAMINOPHEN 325 MG PO TABS: 650.0000 mg | ORAL_TABLET | Freq: Four times a day (QID) | ORAL | Status: DC | PRN

## 2014-01-09 MED ORDER — GABAPENTIN 300 MG PO CAPS
300.0000 mg | ORAL_CAPSULE | Freq: Three times a day (TID) | ORAL | Status: DC
  Administered 2014-01-09 – 2014-01-10 (×3): 300 mg via ORAL
  Filled 2014-01-09 (×3): qty 1

## 2014-01-09 MED ORDER — IBUPROFEN 800 MG PO TABS: 800.0000 mg | ORAL_TABLET | Freq: Three times a day (TID) | ORAL | Status: DC | PRN

## 2014-01-09 MED ORDER — ENOXAPARIN SODIUM 100 MG/ML ~~LOC~~ SOLN
1.0000 mg/kg | Freq: Two times a day (BID) | SUBCUTANEOUS | Status: DC
  Administered 2014-01-09: 190 mg via SUBCUTANEOUS
  Filled 2014-01-09 (×2): qty 2

## 2014-01-09 MED ORDER — HYDROCODONE-ACETAMINOPHEN 7.5-325 MG PO TABS
1.0000 | ORAL_TABLET | Freq: Four times a day (QID) | ORAL | Status: DC | PRN
  Administered 2014-01-10: 1 via ORAL
  Filled 2014-01-09: qty 1

## 2014-01-09 MED ORDER — ONDANSETRON HCL 4 MG/2ML IJ SOLN: 4.0000 mg | Freq: Four times a day (QID) | INTRAMUSCULAR | Status: DC | PRN

## 2014-01-09 MED ORDER — LEVOTHYROXINE SODIUM 100 MCG PO TABS
100.0000 ug | ORAL_TABLET | Freq: Every day | ORAL | Status: DC
  Filled 2014-01-09: qty 1

## 2014-01-09 MED ORDER — CETYLPYRIDINIUM CHLORIDE 0.05 % MT LIQD
7.0000 mL | Freq: Two times a day (BID) | OROMUCOSAL | Status: DC
  Administered 2014-01-10 (×2): 7 mL via OROMUCOSAL

## 2014-01-09 MED ORDER — FUROSEMIDE 20 MG PO TABS
20.0000 mg | ORAL_TABLET | Freq: Every day | ORAL | Status: DC
  Administered 2014-01-10: 20 mg via ORAL
  Filled 2014-01-09: qty 1

## 2014-01-09 MED ORDER — NITROGLYCERIN 0.4 MG SL SUBL
0.4000 mg | SUBLINGUAL_TABLET | SUBLINGUAL | Status: AC | PRN
  Administered 2014-01-09 (×3): 0.4 mg via SUBLINGUAL
  Filled 2014-01-09 (×3): qty 1

## 2014-01-09 MED ORDER — ASPIRIN 81 MG PO CHEW
324.0000 mg | CHEWABLE_TABLET | Freq: Every day | ORAL | Status: DC
  Filled 2014-01-09 (×2): qty 4

## 2014-01-09 MED ORDER — CHLORHEXIDINE GLUCONATE 0.12 % MT SOLN
15.0000 mL | Freq: Two times a day (BID) | OROMUCOSAL | Status: DC
  Administered 2014-01-10: 15 mL via OROMUCOSAL
  Filled 2014-01-09: qty 15

## 2014-01-09 MED ORDER — ASPIRIN 81 MG PO CHEW
324.0000 mg | CHEWABLE_TABLET | Freq: Once | ORAL | Status: AC
  Administered 2014-01-09: 324 mg via ORAL
  Filled 2014-01-09: qty 4

## 2014-01-09 MED ORDER — ZOLPIDEM TARTRATE 5 MG PO TABS: 5.0000 mg | ORAL_TABLET | Freq: Every evening | ORAL | Status: DC | PRN

## 2014-01-09 MED ORDER — ONDANSETRON HCL 4 MG PO TABS
4.0000 mg | ORAL_TABLET | Freq: Four times a day (QID) | ORAL | Status: DC | PRN
Start: 2014-01-09 — End: 2014-01-10

## 2014-01-09 MED ORDER — ALUM & MAG HYDROXIDE-SIMETH 200-200-20 MG/5ML PO SUSP: 30.0000 mL | Freq: Four times a day (QID) | ORAL | Status: DC | PRN

## 2014-01-09 MED ORDER — TADALAFIL 5 MG PO TABS: 5.0000 mg | ORAL_TABLET | Freq: Every day | ORAL | Status: DC

## 2014-01-09 MED ORDER — METFORMIN HCL ER 500 MG PO TB24
500.0000 mg | ORAL_TABLET | Freq: Two times a day (BID) | ORAL | Status: DC
  Filled 2014-01-09: qty 1

## 2014-01-09 MED ORDER — SODIUM CHLORIDE 0.9 % IJ SOLN: 3.0000 mL | Freq: Two times a day (BID) | INTRAMUSCULAR | Status: DC

## 2014-01-09 MED ORDER — BUDESONIDE-FORMOTEROL FUMARATE 160-4.5 MCG/ACT IN AERO
INHALATION_SPRAY | RESPIRATORY_TRACT | Status: AC
  Filled 2014-01-09: qty 6

## 2014-01-09 MED ORDER — BUDESONIDE-FORMOTEROL FUMARATE 160-4.5 MCG/ACT IN AERO
2.0000 | INHALATION_SPRAY | Freq: Two times a day (BID) | RESPIRATORY_TRACT | Status: DC
  Administered 2014-01-10: 2 via RESPIRATORY_TRACT
  Filled 2014-01-09: qty 6

## 2014-01-09 MED ORDER — DOCUSATE SODIUM 100 MG PO CAPS: 100.0000 mg | ORAL_CAPSULE | Freq: Every day | ORAL | Status: DC | PRN

## 2014-01-09 MED ORDER — ALLOPURINOL 300 MG PO TABS
300.0000 mg | ORAL_TABLET | Freq: Every day | ORAL | Status: DC
  Administered 2014-01-09: 300 mg via ORAL
  Filled 2014-01-09 (×2): qty 1

## 2014-01-09 MED ORDER — SODIUM CHLORIDE 0.9 % IJ SOLN
INTRAMUSCULAR | Status: AC
  Administered 2014-01-09: 18:00:00
  Filled 2014-01-09: qty 45

## 2014-01-09 NOTE — H&P (Signed)
History and Physical  Frank Shelton ZOX:[id number] DOB: Aug 03, 1960 DOA: 01/09/2014  Referring physician: Dr Elesa Massed, ED physician PCP: Vennie Homans, MD   Chief Complaint: Chest Pryor Ochoa  HPI: Frank Shelton is a 53 y.o. male  With a history of obstructive sleep apnea, thyromegaly, hypothyroidism, morbid obesity, COPD, history of 3 pulmonary embolisms with anticoagulation with Xarelto and an IVC filter due to familial hypercoagulopathy. He presents the ED with 24 hours of chest pressure and heaviness that is worse with activity and better with rest.  It has been increasing over the past 12 hours. He was intubated by shortness of breath, cough, numbness in the arms bilaterally. Pressure feels similarly to prior pulmonary embolism, although more severe. He did vomit once yesterday due to posttussive emesis.  Cardiac risk factors:  #1 diabetes #2 male #3 smoker #4 2 primary relatives with early MI  Review of Systems:   Pt complains of  dyspnea on exertion, tachypnea, coughing yesterday.  Pt denies any  palpitations, irregular heartbeat, abdominal pain, headaches, vision changes.  Review of systems are otherwise negative  Past Medical History  Diagnosis Date  . Pulmonary embolism   . Embolism - blood clot   . Gout   . Sleep apnea   . Hypertension   . Diabetes mellitus without complication    Past Surgical History  Procedure Laterality Date  . Heller myotomy    . Skin graft     Social History:  reports that he has quit smoking. He has quit using smokeless tobacco. He reports that he drinks alcohol. He reports that he does not use illicit drugs. Patient lives at  home & is able to participate in activities of daily living without assistance  Allergies  Allergen Reactions  . Nabumetone Hives    History reviewed. No pertinent family history.   Prior to Admission medications   Medication Sig Start Date End Date Taking? Authorizing Provider  allopurinol (ZYLOPRIM) 300 MG tablet  Take 300 mg by mouth daily.   Yes Historical Provider, MD  budesonide-formoterol (SYMBICORT) 160-4.5 MCG/ACT inhaler Inhale 2 puffs into the lungs 2 (two) times daily.   Yes Historical Provider, MD  furosemide (LASIX) 20 MG tablet Take 20 mg by mouth daily as needed.   Yes Historical Provider, MD  gabapentin (NEURONTIN) 300 MG capsule Take 300 mg by mouth 3 (three) times daily. 11/19/13  Yes Historical Provider, MD  ibuprofen (ADVIL,MOTRIN) 800 MG tablet Take 800 mg by mouth 3 (three) times daily as needed. 11/19/13  Yes Historical Provider, MD  levothyroxine (SYNTHROID, LEVOTHROID) 100 MCG tablet Take 100 mcg by mouth daily.   Yes Historical Provider, MD  metFORMIN (GLUCOPHAGE-XR) 500 MG 24 hr tablet Take 500 mg by mouth 2 (two) times daily. 11/19/13  Yes Historical Provider, MD  Multiple Vitamin (MULTIVITAMIN WITH MINERALS) TABS tablet Take 1 tablet by mouth daily.   Yes Historical Provider, MD  nebivolol (BYSTOLIC) 5 MG tablet Take 5 mg by mouth daily.   Yes Historical Provider, MD  potassium chloride (K-DUR,KLOR-CON) 10 MEQ tablet Take 10 mEq by mouth 2 (two) times daily. 11/19/13  Yes Historical Provider, MD  Rivaroxaban (XARELTO) 20 MG TABS Take 1 tablet (20 mg total) by mouth daily. 02/05/12  Yes Kathlen Mody, MD  tadalafil (CIALIS) 5 MG tablet Take 5 mg by mouth daily.   Yes Historical Provider, MD  colchicine 0.6 MG tablet Take 0.6 mg by mouth daily as needed. For gout flare/pain    Historical Provider, MD  furosemide (  LASIX) 20 MG tablet Take 1 tablet (20 mg total) by mouth daily as needed. 01/14/12 01/13/13  Kathlen Mody, MD  HYDROcodone-acetaminophen (NORCO) 7.5-325 MG per tablet Take 1 tablet by mouth daily as needed. For shoulder pain    Historical Provider, MD  potassium chloride (K-DUR) 10 MEQ tablet Take 1 tablet (10 mEq total) by mouth daily as needed. 01/14/12 01/13/13  Kathlen Mody, MD    Physical Exam: BP 111/62  Pulse 71  Resp 17  Ht  (1.93 m)  Wt 191.917 kg (423 lb 1.6 oz)  BMI  51.52 kg/m2  SpO2 100%  General: Middle-aged Caucasian male. Awake and alert and oriented x3. No acute cardiopulmonary distress. Nasal cannula Eyes: Pupils equal, round, reactive to light. Extraocular muscles are intact. Sclerae anicteric and noninjected.  ENT: External auditory canals are patent and tympanic membranes reflect a good cone of light. Oropharynx dry. No mucosal lesions.  Neck: Neck supple without lymphadenopathy. No carotid bruits. Thyroid quite enlarged and extends into the chest cavity  Cardiovascular: Regular rate with normal S1-S2 sounds. No murmurs, rubs, gallops auscultated. No JVD.  Respiratory: Good respiratory effort with no wheezes, rales, rhonchi. Lungs clear to auscultation bilaterally.  Abdomen: Obese, Soft, mild tenderness to ventral hernia, nondistended. Active bowel sounds. No masses or hepatosplenomegaly  Skin: Dry, warm to touch. Chronic venous stasis changes lower extremities bilaterally. Dorsalis pedis and radial pulses 2+ Musculoskeletal: No calf or leg pain. All major joints not erythematous nontender.  Psychiatric: Intact judgment and insight.  Neurologic: No focal neurological deficits.           Labs on Admission:  Basic Metabolic Panel:  Recent Labs Lab 01/09/14 1635  NA 138  K 4.2  CL 101  CO2 24  GLUCOSE 118*  BUN 16  CREATININE 0.93  CALCIUM 9.1   Liver Function Tests: No results found for this basename: AST, ALT, ALKPHOS, BILITOT, PROT, ALBUMIN,  in the last 168 hours No results found for this basename: LIPASE, AMYLASE,  in the last 168 hours No results found for this basename: AMMONIA,  in the last 168 hours CBC:  Recent Labs Lab 01/09/14 1635  WBC 7.9  HGB 14.0  HCT 41.4  MCV 94.3  PLT 182   Cardiac Enzymes:  Recent Labs Lab 01/09/14 1635  TROPONINI <0.30    BNP (last 3 results)  Recent Labs  01/09/14 1635  PROBNP 211.2*   CBG: No results found for this basename: GLUCAP,  in the last 168 hours  Radiological  Exams on Admission: Ct Angio Chest W/cm &/or Wo Cm  01/09/2014   CLINICAL DATA:  Chest pain, shortness of breath. Two episodes of hemoptysis. History of PE. IVC filter.  EXAM: CT ANGIOGRAPHY CHEST WITH CONTRAST  TECHNIQUE: Multidetector CT imaging of the chest was performed using the standard protocol during bolus administration of intravenous contrast. Multiplanar CT image reconstructions and MIPs were obtained to evaluate the vascular anatomy.  CONTRAST:  OMNIPAQUE IOHEXOL 350 MG/ML SOLN  COMPARISON:  Chest CT 09/10/2009.  FINDINGS: Markedly limited evaluation secondary to pulmonary motion artifact as well as quantum mottle artifact from body habitus. There is no evidence for central or main pulmonary arterial emboli. Evaluation of the lobar, segmental and subsegmental pulmonary arteries is markedly limited. Multiple suggestive filling defects within the segmental and subsegmental pulmonary arteries are likely secondary to quantum mottle and motion artifact.  The thyroid is massively enlarged with multiple areas of low attenuation and calcification. This exerts mass effect on the  trachea. Bilateral gynecomastia. Multiple sub cm mediastinal lymph nodes are present. Nonspecific 6 mm right hilar lymph node (image 50; series 4). Heart is enlarged. No pericardial effusion. Normal caliber aorta and main pulmonary artery.  Central airways are patent although there is mild narrowing of the trachea secondary to marked thyromegaly. Minimal dependent ground-glass opacities within the bilateral lower lobes. Small left pleural effusion.  Limited visualization of the upper abdomen demonstrates cholelithiasis. Hepatic steatosis. Small hiatal hernia.  No aggressive or acute appearing osseous lesions.  Review of the MIP images confirms the above findings.  IMPRESSION: Markedly limited evaluation secondary to pulmonary motion artifact and quantum mottle artifact from body habitus. No evidence for central, main or proximal  lobar pulmonary emboli. Evaluation of the distal lobar, segmental and subsegmental pulmonary arteries is nondiagnostic due to the above described artifacts. Multiple suggestive filling defects within the segmental and subsegmental pulmonary arteries are likely secondary to quantum mottling motion artifact. Given patient's body habitus, consider further evaluation with V/Q scan as clinically indicated.  Multiple nonspecific sub cm right inferior hilar lymph nodes.  Possibly enlarged thyroid with multiple areas of low attenuation and calcification. Recommend correlation with thyroid ultrasound.  Small left pleural effusion.  Hepatic steatosis.  Cholelithiasis.  Critical Value/emergent results were called by telephone at the time of interpretation on 01/09/2014 at 6:44 pm to Dr. Rochele Raring , who verbally acknowledged these results.   Electronically Signed   By: Annia Belt M.D.   On: 01/09/2014 18:50   Dg Chest Portable 1 View  01/09/2014   CLINICAL DATA:  Chest pain and shortness of breath with history of pulmonary embolism and diabetes and prior tobacco use  EXAM: PORTABLE CHEST - 1 VIEW  COMPARISON:  PA and lateral chest x-ray of June 03, 2010.  FINDINGS: The cardiopericardial silhouette is enlarged. The pulmonary vascularity is engorged. The pulmonary interstitial markings are mildly increased. There is no pleural effusion. The trachea is midline. There is mild tortuosity of the descending thoracic aorta. The bony thorax is unremarkable.  IMPRESSION: CHF with mild interstitial edema. There is no evidence of pneumonia.   Electronically Signed   By: David  [place]   On: 01/09/2014 17:02    EKG: Independently reviewed. Normal sinus rhythm with normal intervals. One PVC. No ST elevation or depression  Assessment/Plan Present on Admission:  . Chest pain  #1 chest pain Admit to observation Due to inadequate CTA due to movement artifact we'll obtain VQ scan We'll treat with Lovenox Continue  troponins Consult cardiology in the morning Check TSH Check lipids Continue aspirin  #2 diabetes Continue metformin  #3 hypertension Continue antihypertensives  #4 hypercoagulable state Patient will be on treatment dose of Lovenox. Hold Xarelto  #5 obstructive sleep apnea Consults respiratory therapy for CPAP at 18 cm of water   Consultants: Cardiology in the morning  Code Status: Full  Family Communication: Wife in the room   Disposition Plan: Return home following treatment  Time spent: 50 minutes  Candelaria Celeste, [place] [facility] Pager [phone]  **Disclaimer: This note may have been dictated with voice recognition software. Similar sounding words can inadvertently be transcribed and this note may contain transcription errors which may not have been corrected upon publication of note.**

## 2014-01-09 NOTE — ED Notes (Signed)
Pt states he has has pressure in his chest and SOB x2 days. Pt diaphoretic at triage. Pt has HX of blood clots per pt.

## 2014-01-09 NOTE — Progress Notes (Signed)
Radiology called inquiring about patient's VQ scan.  Radiology spoke with MD and it was decided that it was okay to do VQ scan in am.

## 2014-01-09 NOTE — ED Provider Notes (Signed)
This chart was scribed for Frank Maw Ward, DO by Marica Otter, ED Scribe. This patient was seen in room APA06/APA06 and the patient's care was started at 4:43 PM.  TIME SEEN: 4:43 PM  CHIEF COMPLAINT:  Chief Complaint  Patient presents with  . Chest Pain   HPI: PCP: Vennie Homans, MD  HPI Comments: Frank Shelton is a 53 y.o. male, with medical Hx noted below and significant for hypertension, diabetes, lipidemia, prior history of tobacco use who quit 9 years ago, pulmonary embolus on Xarelto and s/p IVC filter who presents to the Emergency Department complaining of intermittent chest pain with associated SOB and light headedness onset 3 weeks ago.  It is also had nausea, diaphoresis with these symptoms. Pain is better with rest. Pt describes his pain as feeling pressure in his chest and states "my chest feels like it weighs 10,000 pounds." Pt rates his pain an 8 out of 10. Pt states his chest pain is worse with walking. Pt reports taking  of aspirin today around 2pm for his chest pain. Pt reports he currently does not have a cardiologist and has not seen one for a couple of years. Pt further reports, that while he has had a stress test in the past, it was a many years ago.  Pt also complains of a cough and two episodes of hemoptysis (one episode yesterday and the prior episode a couple of weeks ago).  He describes it as a blood-streaked sputum. He is also had some green sputum production. No fever.   Family Hx for Heart Disease: Pt denies any Hx of MI, stent placement.  Pt reports his mother had heart disease and notes that his sister has "heart problems."     ROS: See HPI Constitutional: no fever  Eyes: no drainage  ENT: no runny nose   Cardiovascular:  Positive for chest pain  Resp: positive for SOB  GI: no vomiting GU: no dysuria Integumentary: no rash  Allergy: no hives  Musculoskeletal: no leg swelling  Neurological: no slurred speech ROS otherwise negative  PAST  MEDICAL HISTORY/PAST SURGICAL HISTORY:  Past Medical History  Diagnosis Date  . Pulmonary embolism   . Embolism - blood clot   . Gout   . Sleep apnea   . Hypertension   . Diabetes mellitus without complication     MEDICATIONS:  Prior to Admission medications   Medication Sig Start Date End Date Taking? Authorizing Provider  albuterol (PROVENTIL HFA;VENTOLIN HFA) 108 (90 BASE) MCG/ACT inhaler Inhale 2 puffs into the lungs every 6 (six) hours as needed. FOR SHORTNESS OF BREATH    Historical Provider, MD  allopurinol (ZYLOPRIM) 300 MG tablet Take 300 mg by mouth daily.    Historical Provider, MD  colchicine 0.6 MG tablet Take 0.6 mg by mouth daily as needed. For gout flare/pain    Historical Provider, MD  furosemide (LASIX) 20 MG tablet Take 1 tablet (20 mg total) by mouth daily as needed. 01/14/12 01/13/13  Kathlen Mody, MD  HYDROcodone-acetaminophen (NORCO) 7.5-325 MG per tablet Take 1 tablet by mouth daily as needed. For shoulder pain    Historical Provider, MD  levothyroxine (SYNTHROID, LEVOTHROID) 100 MCG tablet Take 100 mcg by mouth daily.    Historical Provider, MD  potassium chloride (K-DUR) 10 MEQ tablet Take 1 tablet (10 mEq total) by mouth daily as needed. 01/14/12 01/13/13  Kathlen Mody, MD  rivaroxaban (XARELTO) 15 MG TABS tablet Take 1 tablet (15 mg total) by mouth 2 (two) times  daily. 01/14/12   Kathlen Mody, MD  Rivaroxaban (XARELTO) 15 MG TABS tablet Take 1 tablet (15 mg total) by mouth 2 (two) times daily. 01/14/12   Kathlen Mody, MD  Rivaroxaban (XARELTO) 20 MG TABS Take 1 tablet (20 mg total) by mouth daily. 02/05/12   Kathlen Mody, MD    ALLERGIES:  Allergies  Allergen Reactions  . Nabumetone Hives    SOCIAL HISTORY:  History  Substance Use Topics  . Smoking status: Former Games developer  . Smokeless tobacco: Former Neurosurgeon  . Alcohol Use: Yes     Comment: occasionally    FAMILY HISTORY: History reviewed. No pertinent family history.  EXAM: Triage Vitals: BP 111/50   Pulse 79  Resp 38  Ht  (1.93 m)  Wt 410 lb (185.975 kg)  BMI 49.93 kg/m2  SpO2 100%  CONSTITUTIONAL: Alert and oriented and responds appropriately to questions. Well-appearing; well-nourished. Appears uncomfortable and obese.  HEAD: Normocephalic EYES: Conjunctivae clear, PERRL ENT: normal nose; no rhinorrhea; moist mucous membranes; pharynx without lesions noted NECK: Supple, no meningismus, no LAD  CARD: RRR; S1 and S2 appreciated; no murmurs, no clicks, no rubs, no gallops RESP: Normal chest excursion without splinting the patient is mildly tachypneic but this improves on oxygen; breath sounds clear and equal bilaterally; no wheezes, no rhonchi, no rales, exam is limited secondary to patient's morbid obesity; he is not hypoxic ABD/GI: Normal bowel sounds; non-distended; soft, non-tender, no rebound, no guarding BACK:  The back appears normal and is non-tender to palpation, there is no CVA tenderness EXT: Normal ROM in all joints; non-tender to palpation; edema in LE but not pitting; venous stasis dermatitis ; normal capillary refill; no cyanosis    SKIN: Normal color for age and race; warm NEURO: Moves all extremities equally PSYCH: The patient's mood and manner are appropriate. Grooming and personal hygiene are appropriate. LUNGS: tachypnic but speaking full sentences and no hypoxia   MEDICAL DECISION MAKING: Patient here with chest pain. He has multiple risk factors for ACS. He also has a history of pulmonary embolus. We'll obtain cardiac labs, chest x-ray and CT of his chest. He appears much more comfortable on oxygen. We'll give aspirin and nitroglycerin.  ED PROGRESS: Patient's labs are unremarkable. Troponin negative. EKG shows no ischemic changes. Chest x-ray clear. Pain is improved with nitroglycerin.   CT chest shows no large pulmonary embolus but is limited due to patient's size and motion artifact. Discuss with radiologist who recommends that patient receive a VQ scan.  Unable to obtain this imaging tonight. We'll admit for chest pain rule out. Discussed with Dr. Adrian Blackwater with hospitalist service who agrees. We'll give therapeutic Lovenox in case patient does have a new PE despite being on Xarelto.  Patient is pain-free after 3 nitroglycerin. Family updated with plan.    EKG Interpretation  Date/Time:  Thursday January 09 2014 16:33:58 EDT Ventricular Rate:  81 PR Interval:  167 QRS Duration: 86 QT Interval:  404 QTC Calculation: 469 R Axis:   30 Text Interpretation:  Sinus rhythm Ventricular premature complex Abnormal R-wave progression, early transition Artifact Confirmed by WARD,  DO, KRISTEN [phone]) on 01/09/2014 5:11:18 PM        I personally performed the services described in this documentation, which was scribed in my presence. The recorded information has been reviewed and is accurate.    Frank Maw Ward, DO 01/09/14 [phone]

## 2014-01-09 NOTE — ED Notes (Signed)
Family at bedside. Patient states he still having that feeling of a ton of bricks sitting on his chest.

## 2014-01-09 NOTE — Progress Notes (Signed)
ANTICOAGULATION CONSULT NOTE - Initial Consult  Pharmacy Consult for Lovenox Indication: pulmonary embolus  Allergies  Allergen Reactions  . Nabumetone Hives    Patient Measurements: Height:  (193 cm) Weight: 423 lb 1.6 oz (191.917 kg) IBW/kg (Calculated) : 86.8 Heparin Dosing Weight:   Vital Signs: BP: 111/62 mmHg (08/27 1900) Pulse Rate: 71 (08/27 1915)  Labs:  Recent Labs  01/09/14 1635  HGB 14.0  HCT 41.4  PLT 182  CREATININE 0.93  TROPONINI <0.30    Estimated Creatinine Clearance: 163.5 ml/min (by C-G formula based on Cr of 0.93).   Medical History: Past Medical History  Diagnosis Date  . Pulmonary embolism   . Embolism - blood clot   . Gout   . Sleep apnea   . Hypertension   . Diabetes mellitus without complication     Medications:  Scheduled:  . enoxaparin (LOVENOX) injection  1 mg/kg Subcutaneous Q12H    Assessment: Past history of PE and IVC filter, presently on Xarelto PTA medications reviewed. Patient indicates taking Xarelto 20 mg daily for 3 days, then  Xarelto 15 mg for one day with Xarelto 15 mg due tonight. Last dose yesterday per PTA, time not noted. CT scan - no large PE but limited due to patient size and motion artifact. Radiologist recommends VQ scan, unable to obtain this imaging tonight. Admitted for chest pain rule out, therapeutic Lovenox due to possibility of new PE despite being on Xarelto. Labs reviewed Obese patient, excellent renal function  Goal of Therapy:  Anti-Xa level 0.6-1 units/ml 4hrs after LMWH dose given Monitor platelets by anticoagulation protocol: Yes   Plan:  Lovenox 1 mg/kg (190 mg) SQ every 12 hours. Monitor renal function Monitor CBC, platelets Monitor for signs of bleeding  Frank Shelton, Frank Shelton 01/09/2014,7:59 PM

## 2014-01-10 ENCOUNTER — Observation Stay (HOSPITAL_COMMUNITY): Payer: Medicare PPO

## 2014-01-10 ENCOUNTER — Encounter (HOSPITAL_COMMUNITY): Payer: Self-pay

## 2014-01-10 DIAGNOSIS — R079 Chest pain, unspecified: Secondary | ICD-10-CM

## 2014-01-10 DIAGNOSIS — G4733 Obstructive sleep apnea (adult) (pediatric): Secondary | ICD-10-CM

## 2014-01-10 DIAGNOSIS — J449 Chronic obstructive pulmonary disease, unspecified: Secondary | ICD-10-CM

## 2014-01-10 DIAGNOSIS — K219 Gastro-esophageal reflux disease without esophagitis: Secondary | ICD-10-CM

## 2014-01-10 DIAGNOSIS — R0989 Other specified symptoms and signs involving the circulatory and respiratory systems: Secondary | ICD-10-CM

## 2014-01-10 DIAGNOSIS — R112 Nausea with vomiting, unspecified: Secondary | ICD-10-CM

## 2014-01-10 DIAGNOSIS — K220 Achalasia of cardia: Secondary | ICD-10-CM

## 2014-01-10 DIAGNOSIS — Z8249 Family history of ischemic heart disease and other diseases of the circulatory system: Secondary | ICD-10-CM

## 2014-01-10 DIAGNOSIS — M109 Gout, unspecified: Secondary | ICD-10-CM

## 2014-01-10 DIAGNOSIS — R0789 Other chest pain: Secondary | ICD-10-CM

## 2014-01-10 DIAGNOSIS — R0609 Other forms of dyspnea: Secondary | ICD-10-CM

## 2014-01-10 DIAGNOSIS — D6859 Other primary thrombophilia: Secondary | ICD-10-CM

## 2014-01-10 DIAGNOSIS — I82409 Acute embolism and thrombosis of unspecified deep veins of unspecified lower extremity: Secondary | ICD-10-CM

## 2014-01-10 DIAGNOSIS — Z86718 Personal history of other venous thrombosis and embolism: Secondary | ICD-10-CM

## 2014-01-10 DIAGNOSIS — I10 Essential (primary) hypertension: Secondary | ICD-10-CM

## 2014-01-10 DIAGNOSIS — Z87891 Personal history of nicotine dependence: Secondary | ICD-10-CM

## 2014-01-10 LAB — CBC
HCT: 40 % (ref 39.0–52.0)
Hemoglobin: 13.6 g/dL (ref 13.0–17.0)
MCH: 32.2 pg (ref 26.0–34.0)
MCHC: 34 g/dL (ref 30.0–36.0)
MCV: 94.6 fL (ref 78.0–100.0)
PLATELETS: 160 10*3/uL (ref 150–400)
RBC: 4.23 MIL/uL (ref 4.22–5.81)
RDW: 13.8 % (ref 11.5–15.5)
WBC: 7.6 10*3/uL (ref 4.0–10.5)

## 2014-01-10 LAB — HEMOGLOBIN A1C
HEMOGLOBIN A1C: 7.2 % — AB (ref <5.7)
MEAN PLASMA GLUCOSE: 160 mg/dL — AB (ref <117)

## 2014-01-10 LAB — BASIC METABOLIC PANEL
Anion gap: 10 (ref 5–15)
BUN: 17 mg/dL (ref 6–23)
CALCIUM: 8.6 mg/dL (ref 8.4–10.5)
CO2: 26 mEq/L (ref 19–32)
CREATININE: 0.8 mg/dL (ref 0.50–1.35)
Chloride: 104 mEq/L (ref 96–112)
GLUCOSE: 117 mg/dL — AB (ref 70–99)
Potassium: 4.2 mEq/L (ref 3.7–5.3)
Sodium: 140 mEq/L (ref 137–147)

## 2014-01-10 LAB — TROPONIN I

## 2014-01-10 LAB — LIPID PANEL
CHOL/HDL RATIO: 3.6 ratio
CHOLESTEROL: 91 mg/dL (ref 0–200)
HDL: 25 mg/dL — ABNORMAL LOW (ref >39)
LDL Cholesterol: 27 mg/dL (ref 0–99)
Triglycerides: 193 mg/dL — ABNORMAL HIGH (ref <150)
VLDL: 39 mg/dL (ref 0–40)

## 2014-01-10 LAB — TSH: TSH: 5.01 u[IU]/mL — ABNORMAL HIGH (ref 0.350–4.500)

## 2014-01-10 MED ORDER — METFORMIN HCL ER 500 MG PO TB24
500.0000 mg | ORAL_TABLET | Freq: Two times a day (BID) | ORAL | Status: DC
  Filled 2014-01-10 (×3): qty 1

## 2014-01-10 MED ORDER — LIVING WELL WITH DIABETES BOOK
Freq: Once | Status: DC
  Filled 2014-01-10: qty 1

## 2014-01-10 MED ORDER — ISOSORBIDE MONONITRATE ER 60 MG PO TB24
30.0000 mg | ORAL_TABLET | Freq: Every day | ORAL | Status: DC
  Administered 2014-01-10: 30 mg via ORAL
  Filled 2014-01-10: qty 1

## 2014-01-10 MED ORDER — LEVOTHYROXINE SODIUM 100 MCG PO TABS: 100.0000 ug | ORAL_TABLET | Freq: Every day | ORAL | Status: DC

## 2014-01-10 MED ORDER — TECHNETIUM TC 99M DIETHYLENETRIAME-PENTAACETIC ACID
40.0000 | Freq: Once | INTRAVENOUS | Status: AC | PRN
  Administered 2014-01-10: 44 via RESPIRATORY_TRACT

## 2014-01-10 MED ORDER — ALLOPURINOL 300 MG PO TABS: 300.0000 mg | ORAL_TABLET | Freq: Every day | ORAL | Status: DC

## 2014-01-10 MED ORDER — ISOSORBIDE MONONITRATE ER 30 MG PO TB24: 30.0000 mg | ORAL_TABLET | Freq: Every day | ORAL | Status: DC

## 2014-01-10 MED ORDER — RIVAROXABAN 20 MG PO TABS
20.0000 mg | ORAL_TABLET | Freq: Every day | ORAL | Status: DC
  Administered 2014-01-10: 20 mg via ORAL
  Filled 2014-01-10: qty 1

## 2014-01-10 MED ORDER — TECHNETIUM TO 99M ALBUMIN AGGREGATED
6.0000 | Freq: Once | INTRAVENOUS | Status: AC | PRN
  Administered 2014-01-10: 6.5 via INTRAVENOUS

## 2014-01-10 MED ORDER — NEBIVOLOL HCL 2.5 MG PO TABS
5.0000 mg | ORAL_TABLET | Freq: Every day | ORAL | Status: DC
  Filled 2014-01-10 (×3): qty 1

## 2014-01-10 MED ORDER — ASPIRIN EC 81 MG PO TBEC: 81.0000 mg | DELAYED_RELEASE_TABLET | Freq: Every day | ORAL | Status: DC

## 2014-01-10 NOTE — Consult Note (Signed)
CARDIOLOGY CONSULT NOTE   Patient ID: Frank Shelton MRN: [id number] DOB/AGE: 11/25/1960 53 y.o.  Admit Date: 01/09/2014 Referring Physician: PTH Primary Physician: Vennie Homans, MD Consulting Cardiologist: Prentice Docker MD Primary Cardiologist: New Reason for Consultation: Chest Pain  Clinical Summary Frank Shelton is a 53 y.o.male with no prior history of CAD, but hx of OSA, COPD, PE X3 with IVC filter, currently being treated with Xarelto, in the setting of familial hypercoagulopathy, hypothyroidism, morbid obesity, admitted from ER with complaints of chest pain. He states he has been seen by a cardiologist in [place], "years ago." At which time he had a stress test, which he was told was negative. He did not followup any more with cardiology after that.  The patient was in his usual state of health, 2 weeks ago, when he was having issues with bronchitis frequent coughing and congestion. The patient states he coughed so much that there was blood in his sputum. 3 days ago, the patient had a coughing spell and began to feel severe pressure in his chest, with some numbness and tingling in his left arm. The pressure lasted all day, he also felt during the night, pressure, became worse the following day with ongoing numbness and tingling in his left arm. He took a baby aspirin with resolution of symptoms in his left arm. Yesterday, prior to coming to the hospital, he had worsening pressure in his chest 8 over 10, worsening dyspnea. He also had episodes of vomiting.  On arrival to the emergency room he said he normotensive, O2 sat 100%, he was to get it, heart rate 85 beats per minute. He states he was feeling palpitations. Lab work was essentially unremarkable. Troponins were found to be negative, he had mildly elevated proBNP at 211.2. CT scan was negative for PE. It was a difficult study and he was followed by a VQ lung scan, which confirmed that he was negative for PE. It was noted  incidentally that he did have cholelithiasis. EKG revealed normal sinus rhythm with PVCs.   He was given 324 mg of aspirin, nitroglycerin, sublingual x3 in ER. He states that the last nitroglycerin begin to feel better and the chest pressure, decreased to a 2/10. The patient states the chest pressure is not been completely relieved, and he continues to have ongoing feelings of chest pressure, although not severe. His main complaint at this time is dyspnea on exertion. Just walking to the bathroom in the room tires him completely. He, states she has been compliant with all medications and use of CPAP.    Allergies  Allergen Reactions  . Nabumetone Hives    Medications Scheduled Medications: . allopurinol  300 mg Oral Daily  . antiseptic oral rinse  7 mL Mouth Rinse q12n4p  . aspirin  324 mg Oral Daily  . budesonide-formoterol  2 puff Inhalation BID  . chlorhexidine  15 mL Mouth Rinse BID  . furosemide  20 mg Oral Daily  . gabapentin  300 mg Oral TID  . levothyroxine  100 mcg Oral QAC breakfast  . [START ON 01/12/2014] metFORMIN  500 mg Oral BID WC  . nebivolol  5 mg Oral QHS  . potassium chloride  10 mEq Oral BID  . rivaroxaban  20 mg Oral Daily  . sodium chloride  3 mL Intravenous Q12H    Infusions:    PRN Medications: acetaminophen, acetaminophen, alum & mag hydroxide-simeth, docusate sodium, HYDROcodone-acetaminophen, ibuprofen, morphine injection, ondansetron (ZOFRAN) IV, ondansetron, zolpidem   Past  Medical History  Diagnosis Date  . Pulmonary embolism   . Embolism - blood clot   . Gout   . Sleep apnea   . Hypertension   . Diabetes mellitus without complication     Past Surgical History  Procedure Laterality Date  . Heller myotomy    . Skin graft      Family History  Problem Relation Age of Onset  . Heart attack Sister 30    Two stents  . Heart attack Mother   . Heart attack Maternal Grandmother   . Heart attack Maternal Grandfather     Social  History Frank Shelton reports that he has quit smoking. He has quit using smokeless tobacco. Frank Shelton reports that he drinks alcohol.  Review of Systems Otherwise reviewed and negative except as outlined.  Physical Examination Blood pressure 99/44, pulse 62, temperature 97.7 F (36.5 C), temperature source Oral, resp. rate 15, height  (1.93 m), weight 407 lb 3 oz (184.7 kg), SpO2 97.00%.  Intake/Output Summary (Last 24 hours) at 01/10/14 1246 Last data filed at 01/10/14 0800  Gross per 24 hour  Intake    240 ml  Output      0 ml  Net    240 ml    Telemetry: Normal sinus rhythm with PVCs  GEN: Resting comfortably, complaining of pressure in his chest. HEENT: Conjunctiva and lids normal, oropharynx clear with moist mucosa. Neck: Supple, no elevated JVP or carotid bruits, no thyromegaly. Lungs: Clear to auscultation, nonlabored breathing at rest. Cardiac: Regular rate and rhythm, occasional extrasystole, no S3 or significant systolic murmur, no pericardial rub. Abdomen: Soft, nontender, no hepatomegaly, bowel sounds present, no guarding or rebound. Extremities: No pitting edema, distal pulses 2+. Venous stasis skin discoloration bilaterally from the feet to pretibial area. Skin: Warm and dry. Musculoskeletal: No kyphosis. Neuropsychiatric: Alert and oriented x3, affect grossly appropriate.  Prior Cardiac Testing/Procedures  1.Echocardiogram 05/2010 Left ventricle: The cavity size was normal. Systolic function was normal. The estimated ejection fraction was in the range of 60% to 65%. Wall motion was normal; there were no regional wall motion abnormalities. Doppler parameters are consistent with abnormal left ventricular relaxation (grade 1 diastolic dysfunction).   Lab Results  Basic Metabolic Panel:  Recent Labs Lab 01/09/14 1635 01/10/14 0443  NA 138 140  K 4.2 4.2  CL 101 104  CO2 24 26  GLUCOSE 118* 117*  BUN 16 17  CREATININE 0.93 0.80  CALCIUM 9.1 8.6     CBC:  Recent Labs Lab 01/09/14 1635 01/10/14 0443  WBC 7.9 7.6  HGB 14.0 13.6  HCT 41.4 40.0  MCV 94.3 94.6  PLT 182 160    Cardiac Enzymes:  Recent Labs Lab 01/09/14 1635 01/09/14 2134 01/10/14 0443 01/10/14 1004  TROPONINI <0.30 <0.30 <0.30 <0.30    BNP: 211.2    Radiology: Ct Angio Chest W/cm &/or Wo Cm  01/09/2014   CLINICAL DATA:  Chest pain, shortness of breath. Two episodes of hemoptysis. History of PE. IVC filter.  EXAM: CT ANGIOGRAPHY CHEST WITH CONTRAST  TECHNIQUE: Multidetector CT imaging of the chest was performed using the standard protocol during bolus administration of intravenous contrast. Multiplanar CT image reconstructions and MIPs were obtained to evaluate the vascular anatomy.  CONTRAST:  OMNIPAQUE IOHEXOL 350 MG/ML SOLN  COMPARISON:  Chest CT 09/10/2009.  FINDINGS: Markedly limited evaluation secondary to pulmonary motion artifact as well as quantum mottle artifact from body habitus. There is no evidence for central or main  pulmonary arterial emboli. Evaluation of the lobar, segmental and subsegmental pulmonary arteries is markedly limited. Multiple suggestive filling defects within the segmental and subsegmental pulmonary arteries are likely secondary to quantum mottle and motion artifact.  The thyroid is massively enlarged with multiple areas of low attenuation and calcification. This exerts mass effect on the trachea. Bilateral gynecomastia. Multiple sub cm mediastinal lymph nodes are present. Nonspecific 6 mm right hilar lymph node (image 50; series 4). Heart is enlarged. No pericardial effusion. Normal caliber aorta and main pulmonary artery.  Central airways are patent although there is mild narrowing of the trachea secondary to marked thyromegaly. Minimal dependent ground-glass opacities within the bilateral lower lobes. Small left pleural effusion.  Limited visualization of the upper abdomen demonstrates cholelithiasis. Hepatic steatosis. Small  hiatal hernia.  No aggressive or acute appearing osseous lesions.  Review of the MIP images confirms the above findings.  IMPRESSION: Markedly limited evaluation secondary to pulmonary motion artifact and quantum mottle artifact from body habitus. No evidence for central, main or proximal lobar pulmonary emboli. Evaluation of the distal lobar, segmental and subsegmental pulmonary arteries is nondiagnostic due to the above described artifacts. Multiple suggestive filling defects within the segmental and subsegmental pulmonary arteries are likely secondary to quantum mottling motion artifact. Given patient's body habitus, consider further evaluation with V/Q scan as clinically indicated.  Multiple nonspecific sub cm right inferior hilar lymph nodes.  Possibly enlarged thyroid with multiple areas of low attenuation and calcification. Recommend correlation with thyroid ultrasound.  Small left pleural effusion.  Hepatic steatosis.  Cholelithiasis.  Critical Value/emergent results were called by telephone at the time of interpretation on 01/09/2014 at 6:44 pm to Dr. Rochele Raring , who verbally acknowledged these results.   Electronically Signed   By: Annia Belt M.D.   On: 01/09/2014 18:50   Nm Pulmonary Perf And Vent  01/10/2014   CLINICAL DATA:  Pulmonary embolism  EXAM: NUCLEAR MEDICINE VENTILATION - PERFUSION LUNG SCAN  TECHNIQUE: Ventilation images were obtained in multiple projections using inhaled aerosol technetium 99 M DTPA. Perfusion images were obtained in multiple projections after intravenous injection of Tc-69m MAA.  RADIOPHARMACEUTICALS:  Forty-four mCi Tc-2m DTPA aerosol and 6.5 mCi Tc-67m MAA  COMPARISON:  Chest x-ray 01/09/2014  FINDINGS: Ventilation: No focal ventilation defect. There is air trapping and lung bases.  Perfusion: No wedge shaped peripheral perfusion defects to suggest acute pulmonary embolism.  Chest x-ray shows mild interstitial edema.  Findings are low probability for pulmonary  embolus.  IMPRESSION: Low probability for pulmonary embolus.   Electronically Signed   By: Natasha Mead M.D.   On: 01/10/2014 08:19   Dg Chest Portable 1 View  01/09/2014   CLINICAL DATA:  Chest pain and shortness of breath with history of pulmonary embolism and diabetes and prior tobacco use  EXAM: PORTABLE CHEST - 1 VIEW  COMPARISON:  PA and lateral chest x-ray of June 03, 2010.  FINDINGS: The cardiopericardial silhouette is enlarged. The pulmonary vascularity is engorged. The pulmonary interstitial markings are mildly increased. There is no pleural effusion. The trachea is midline. There is mild tortuosity of the descending thoracic aorta. The bony thorax is unremarkable.  IMPRESSION: CHF with mild interstitial edema. There is no evidence of pneumonia.   Electronically Signed   By: David  [place]   On: 01/09/2014 17:02     ECG: NSR with PVC's. 81 bpm.    Impression and Recommendations  1. Atypical Chest Pain: Pain was described as pressure, constant, lasting 3 days,  occurring after bronchitis with frequent coughing. He states he did have some hemoptysis. He was ruled out for pulmonary emboli per CT and V/Q scan. Cardiac enzymes are found to be negative x3, EKG , negative for ACS. He is now down to be anemic. He is not found to be in CHF.  Recommend echocardiogram for evaluation of LV function and pulmonary pressures in the setting of obstructive sleep apnea, and COPD. This will help to assist in medical management. K. consider outpatient stress test with cardiovascular risk factors include family history, former smoker, hyperlipidemia,, hypertension, and diabetes. He lives in [place], and wishes to be followed in the [place] office. This can be arranged on discharge. He appears stable from a cardiac standpoint. I would recommend discharge at the echocardiogram is completed unless further workup for COPD and bronchitis as planned per hospitalist service.  2. Hypertension: Blood pressure is low  normal. Recommend removing nitroglycerin paste. Review of all medications does not have him on antihypertensives, only Lasix.  3. Familial Coagulapathy:  hstory of multiple PEs with IVC filter, and  Xarelto daily. Hemoptysis may have been related to coughing with these as a relative. He does not appear to be anemic on review of labs.  4. Diabetes: Continue outpatient treatment for PCP  5.Morbid Obesity: Weight loss and increase activity. He is limited by his lung status. Consider bariatric surgery with discussion as outpatient with primary care.  6. COPD: He is out of followed by pulmonology. Would recommend establishment with pulmonologist in [place] or [place].  Signed: Joni Reining NP  01/10/2014, 12:46 PM Co-Sign MD

## 2014-01-10 NOTE — Progress Notes (Signed)
Patient c/o of left arm numbness from shoulder to hand.  States this happened a few days ago and that he took baby aspirin to get rid of the pain.  Dr. Kerry Hough on unit and notified.  Stated to go ahead and administer Imdur and proceed with the echo.  Orders followed.

## 2014-01-10 NOTE — Progress Notes (Signed)
UR completed 

## 2014-01-10 NOTE — Care Management Note (Signed)
    Page 1 of 1   01/10/2014     3:36:27 PM CARE MANAGEMENT NOTE 01/10/2014  Patient:  ARON, INGE   Account Number:  [id number]  Date Initiated:  01/10/2014  Documentation initiated by:  Kathyrn Sheriff  Subjective/Objective Assessment:   Pt is from home with wife. Pt is independent with ADL's Pt has no HH services or medication needs prior to admission. Pt has RN from [facility] that calls to check on him monthly. Pt has CPAP at home from [facility].     Action/Plan:   Pt plans to discharge home with self care. Pt has no CM needs at this time.   Anticipated DC Date:  01/10/2014   Anticipated DC Plan:  HOME/SELF CARE      DC Planning Services  CM consult      Choice offered to / List presented to:             Status of service:  Completed, signed off Medicare Important Message given?   (If response is "NO", the following Medicare IM given date fields will be blank) Date Medicare IM given:   Medicare IM given by:   Date Additional Medicare IM given:   Additional Medicare IM given by:    Discharge Disposition:  HOME/SELF CARE  Per UR Regulation:    If discussed at Long Length of Stay Meetings, dates discussed:    Comments:  01/10/2014 1500 Kathyrn Sheriff, RN, MSN, [facility]

## 2014-01-10 NOTE — Progress Notes (Signed)
  Echocardiogram 2D Echocardiogram has been performed.  Frank Shelton 01/10/2014, 4:45 PM

## 2014-01-10 NOTE — Progress Notes (Addendum)
ANTICOAGULATION CONSULT NOTE  Indication: pulmonary embolus  Allergies  Allergen Reactions  . Nabumetone Hives   Patient Measurements: Height:  (193 cm) Weight: 407 lb 3 oz (184.7 kg) IBW/kg (Calculated) : 86.8  Vital Signs: Temp: 97.7 F (36.5 C) (08/28 0544) Temp src: Oral (08/28 0544) BP: 99/44 mmHg (08/28 0544) Pulse Rate: 62 (08/28 0544)  Labs:  Recent Labs  01/09/14 1635 01/09/14 2134 01/10/14 0443 01/10/14 1004  HGB 14.0  --  13.6  --   HCT 41.4  --  40.0  --   PLT 182  --  160  --   CREATININE 0.93  --  0.80  --   TROPONINI <0.30 <0.30 <0.30 <0.30    Estimated Creatinine Clearance: 185.9 ml/min (by C-G formula based on Cr of 0.8).   Medical History: Past Medical History  Diagnosis Date  . Pulmonary embolism   . Embolism - blood clot   . Gout   . Sleep apnea   . Hypertension   . Diabetes mellitus without complication    Medications:  Scheduled:  . allopurinol  300 mg Oral Daily  . antiseptic oral rinse  7 mL Mouth Rinse q12n4p  . aspirin  324 mg Oral Daily  . budesonide-formoterol  2 puff Inhalation BID  . chlorhexidine  15 mL Mouth Rinse BID  . furosemide  20 mg Oral Daily  . gabapentin  300 mg Oral TID  . levothyroxine  100 mcg Oral QAC breakfast  . [START ON 01/12/2014] metFORMIN  500 mg Oral BID WC  . nebivolol  5 mg Oral Daily  . potassium chloride  10 mEq Oral BID  . sodium chloride  3 mL Intravenous Q12H   Assessment: Past history of PE and IVC filter, presently on Xarelto PTA medications reviewed. Patient indicates taking Xarelto 20 mg daily. CT scan - no large PE but limited due to patient size and motion artifact. VQ scan low prob. Obese patient, excellent renal function  Goal of Therapy:  Systemic Anticoagulation.  Plan:  Discussed w/ MD, restart home Xarelto for Reduction in the Risk of Recurrence of DVT and of PE. Xarelto  Daily. Monitor for signs of bleeding  Mady Gemma 01/10/2014,10:42 AM

## 2014-01-10 NOTE — Progress Notes (Signed)
[facility] PROGRESS NOTE  Frank Shelton NFA:[id number] DOB: Mar 13, 1961 DOA: 01/09/2014 PCP: Vennie Homans, MD  Assessment/Plan: . Chest pain  #1 chest pain: at rest. Concerning for angina. Continues with "pressure" anterior chest. Only some improvement this am. Required NTG sublingual x3 last night.  VQ scan low probability for PE. Troponin negative x2. No events on tele. EKG SR with occ PVC. Lipid panel with triglycerides 193 HDL 25. TSH pending. VSS no hypoxia.  Continue aspirin. Consider statin. Await cardiology input.    #2 diabetes: await A1c. Continue metformin and carb modified diet. Monitor.   #3 hypertension: fair control. Home meds include lasix, bystolic. Will continue these and monitor.   #4 hypercoagulable state:Patient started on Lovenox. Hx DVT and PE. Will resume  Xarelto at discharge.  #5 obstructive sleep apnea:  CPAP per respiratory therapy.   #6. Hypothyroidism: await TSH. Continue synthroid  #7. Gout: stable at baseline. Continue home meds   #8. Obesity: morbid. BMI 50.0 will request nutritional consult    Code Status: full Family Communication: wife at bedside Disposition Plan: home when ready   Consultants:  cardiology  Procedures:  none  Antibiotics:  none  HPI/Subjective: Sitting up in bed conversing with wife. Continues with mild chest "pressure".   Objective: Filed Vitals:   01/10/14 0544  BP: 99/44  Pulse: 62  Temp: 97.7 F (36.5 C)  Resp: 15    Intake/Output Summary (Last 24 hours) at 01/10/14 1107 Last data filed at 01/10/14 0800  Gross per 24 hour  Intake    240 ml  Output      0 ml  Net    240 ml   Filed Weights   01/09/14 1633 01/09/14 1716 01/09/14 2104  Weight: 185.975 kg (410 lb) 191.917 kg (423 lb 1.6 oz) 184.7 kg (407 lb 3 oz)    Exam:   General:  Obese appears comfortable  Cardiovascular: RRR No MGR LE with venous stasis changes trace LE edema  Respiratory: normal effort BS clear bilaterally no  wheeze  Abdomen: morbidly obese soft +BS non-tender to palpation  Musculoskeletal:  No clubbing or cyanosis  Data Reviewed: Basic Metabolic Panel:  Recent Labs Lab 01/09/14 1635 01/10/14 0443  NA 138 140  K 4.2 4.2  CL 101 104  CO2 24 26  GLUCOSE 118* 117*  BUN 16 17  CREATININE 0.93 0.80  CALCIUM 9.1 8.6   Liver Function Tests: No results found for this basename: AST, ALT, ALKPHOS, BILITOT, PROT, ALBUMIN,  in the last 168 hours No results found for this basename: LIPASE, AMYLASE,  in the last 168 hours No results found for this basename: AMMONIA,  in the last 168 hours CBC:  Recent Labs Lab 01/09/14 1635 01/10/14 0443  WBC 7.9 7.6  HGB 14.0 13.6  HCT 41.4 40.0  MCV 94.3 94.6  PLT 182 160   Cardiac Enzymes:  Recent Labs Lab 01/09/14 1635 01/09/14 2134 01/10/14 0443 01/10/14 1004  TROPONINI <0.30 <0.30 <0.30 <0.30   BNP (last 3 results)  Recent Labs  01/09/14 1635  PROBNP 211.2*   CBG: No results found for this basename: GLUCAP,  in the last 168 hours  No results found for this or any previous visit (from the past 240 hour(s)).   Studies: Ct Angio Chest W/cm &/or Wo Cm  01/09/2014   CLINICAL DATA:  Chest pain, shortness of breath. Two episodes of hemoptysis. History of PE. IVC filter.  EXAM: CT ANGIOGRAPHY CHEST WITH CONTRAST  TECHNIQUE: Multidetector CT imaging  of the chest was performed using the standard protocol during bolus administration of intravenous contrast. Multiplanar CT image reconstructions and MIPs were obtained to evaluate the vascular anatomy.  CONTRAST:  OMNIPAQUE IOHEXOL 350 MG/ML SOLN  COMPARISON:  Chest CT 09/10/2009.  FINDINGS: Markedly limited evaluation secondary to pulmonary motion artifact as well as quantum mottle artifact from body habitus. There is no evidence for central or main pulmonary arterial emboli. Evaluation of the lobar, segmental and subsegmental pulmonary arteries is markedly limited. Multiple suggestive  filling defects within the segmental and subsegmental pulmonary arteries are likely secondary to quantum mottle and motion artifact.  The thyroid is massively enlarged with multiple areas of low attenuation and calcification. This exerts mass effect on the trachea. Bilateral gynecomastia. Multiple sub cm mediastinal lymph nodes are present. Nonspecific 6 mm right hilar lymph node (image 50; series 4). Heart is enlarged. No pericardial effusion. Normal caliber aorta and main pulmonary artery.  Central airways are patent although there is mild narrowing of the trachea secondary to marked thyromegaly. Minimal dependent ground-glass opacities within the bilateral lower lobes. Small left pleural effusion.  Limited visualization of the upper abdomen demonstrates cholelithiasis. Hepatic steatosis. Small hiatal hernia.  No aggressive or acute appearing osseous lesions.  Review of the MIP images confirms the above findings.  IMPRESSION: Markedly limited evaluation secondary to pulmonary motion artifact and quantum mottle artifact from body habitus. No evidence for central, main or proximal lobar pulmonary emboli. Evaluation of the distal lobar, segmental and subsegmental pulmonary arteries is nondiagnostic due to the above described artifacts. Multiple suggestive filling defects within the segmental and subsegmental pulmonary arteries are likely secondary to quantum mottling motion artifact. Given patient's body habitus, consider further evaluation with V/Q scan as clinically indicated.  Multiple nonspecific sub cm right inferior hilar lymph nodes.  Possibly enlarged thyroid with multiple areas of low attenuation and calcification. Recommend correlation with thyroid ultrasound.  Small left pleural effusion.  Hepatic steatosis.  Cholelithiasis.  Critical Value/emergent results were called by telephone at the time of interpretation on 01/09/2014 at 6:44 pm to Dr. Rochele Raring , who verbally acknowledged these results.    Electronically Signed   By: Annia Belt M.D.   On: 01/09/2014 18:50   Nm Pulmonary Perf And Vent  01/10/2014   CLINICAL DATA:  Pulmonary embolism  EXAM: NUCLEAR MEDICINE VENTILATION - PERFUSION LUNG SCAN  TECHNIQUE: Ventilation images were obtained in multiple projections using inhaled aerosol technetium 99 M DTPA. Perfusion images were obtained in multiple projections after intravenous injection of Tc-14m MAA.  RADIOPHARMACEUTICALS:  Forty-four mCi Tc-67m DTPA aerosol and 6.5 mCi Tc-45m MAA  COMPARISON:  Chest x-ray 01/09/2014  FINDINGS: Ventilation: No focal ventilation defect. There is air trapping and lung bases.  Perfusion: No wedge shaped peripheral perfusion defects to suggest acute pulmonary embolism.  Chest x-ray shows mild interstitial edema.  Findings are low probability for pulmonary embolus.  IMPRESSION: Low probability for pulmonary embolus.   Electronically Signed   By: Natasha Mead M.D.   On: 01/10/2014 08:19   Dg Chest Portable 1 View  01/09/2014   CLINICAL DATA:  Chest pain and shortness of breath with history of pulmonary embolism and diabetes and prior tobacco use  EXAM: PORTABLE CHEST - 1 VIEW  COMPARISON:  PA and lateral chest x-ray of June 03, 2010.  FINDINGS: The cardiopericardial silhouette is enlarged. The pulmonary vascularity is engorged. The pulmonary interstitial markings are mildly increased. There is no pleural effusion. The trachea is midline. There is  mild tortuosity of the descending thoracic aorta. The bony thorax is unremarkable.  IMPRESSION: CHF with mild interstitial edema. There is no evidence of pneumonia.   Electronically Signed   By: David  [place]   On: 01/09/2014 17:02    Scheduled Meds: . allopurinol  300 mg Oral Daily  . antiseptic oral rinse  7 mL Mouth Rinse q12n4p  . aspirin  324 mg Oral Daily  . budesonide-formoterol  2 puff Inhalation BID  . chlorhexidine  15 mL Mouth Rinse BID  . furosemide  20 mg Oral Daily  . gabapentin  300 mg Oral TID  .  levothyroxine  100 mcg Oral QAC breakfast  . [START ON 01/12/2014] metFORMIN  500 mg Oral BID WC  . nebivolol  5 mg Oral QHS  . potassium chloride  10 mEq Oral BID  . rivaroxaban  20 mg Oral Daily  . sodium chloride  3 mL Intravenous Q12H   Continuous Infusions:   Principal Problem:   Chest pain Active Problems:   THYROMEGALY   Morbid obesity   OBSTRUCTIVE SLEEP APNEA   HYPERTENSION   GERD   ASTHMA   DVT (deep venous thrombosis)    Time spent: 35 minutes    [facility] M  [facility] Pager [phone]. If 7PM-7AM, please contact night-coverage at [email or web address], password [facility] 01/10/2014, 11:07 AM  LOS: 1 day

## 2014-01-10 NOTE — Discharge Summary (Signed)
Physician Discharge Summary  Frank Shelton ZOX:[id number] DOB: May 22, 1960 DOA: 01/09/2014  PCP: Vennie Homans, MD  Admit date: 01/09/2014 Discharge date: 01/10/2014  Time spent: 40 minutes  Recommendations for Outpatient Follow-up:  1. Patient will follow up with cardiology next week for a stress test. Cardiology will arrange the study  Discharge Diagnoses:  Principal Problem:   Chest pain Active Problems:   THYROMEGALY   Morbid obesity   OBSTRUCTIVE SLEEP APNEA   HYPERTENSION   GERD   ASTHMA   DVT (deep venous thrombosis)   Discharge Condition: Improved  Diet recommendation: Low salt  Filed Weights   01/09/14 1633 01/09/14 1716 01/09/14 2104  Weight: 185.975 kg (410 lb) 191.917 kg (423 lb 1.6 oz) 184.7 kg (407 lb 3 oz)    History of present illness and hospital course:  This patient presents to the hospital with chest discomfort. He has known history of venous thromboembolism and is chronically on anticoagulation. Initial workup in the emergency room with CT angiogram did not reveal any underlying pulmonary embolus. Since this was a suboptimal study, further evaluation with VQ scan confirmed that no pulmonary embolus was detected. He was continued on his prophylactic dose of anticoagulation. He continued to have some chest discomfort and he was seen by cardiology. He reported improvement of his symptoms with nitroglycerin and was started on Imdur. He was seen by cardiology and felt that the patient may benefit from a stress test, but this can likely be pursued in the outpatient setting. The patient was eager to be discharged home on 8/28. Case was discussed with cardiology and echocardiogram was reviewed. It was felt that the patient is stable for discharge today to follow up with cardiology next week for likely a two-day stress test. He was advised to return to the hospital if his symptoms recurred/got worse.  Procedures: Echo: - Procedure narrative: Transthoracic  echocardiography. Image quality was suboptimal. The study was technically difficult, as a result of poor sound wave transmission and body habitus. - Left ventricle: The cavity size was at the upper limits of normal. Systolic function was normal. The estimated ejection fraction was in the range of 60% to 65%. Regional wall motion abnormalities cannot be excluded. However, no gross regional variation noted, particularly on apical-4 and apical-3 chamber views. Left ventricular diastolic function parameters were normal. Borderline concentric LVH. - Mitral valve: Mildly thickened leaflets . There was trivial regurgitation. - Left atrium: The atrium was moderately to severely dilated. - Right ventricle: The cavity size was mildly dilated. Wall thickness was normal. Systolic function was normal.  Consultations:  Cardiology  Discharge Exam: Filed Vitals:   01/10/14 1425  BP: 130/65  Pulse: 73  Temp: 98 F (36.7 C)  Resp: 16    General: NAd Cardiovascular: S1, S2 RRR Respiratory: CTA B  Discharge Instructions You were cared for by a hospitalist during your hospital stay. If you have any questions about your discharge medications or the care you received while you were in the hospital after you are discharged, you can call the unit and asked to speak with the hospitalist on call if the hospitalist that took care of you is not available. Once you are discharged, your primary care physician will handle any further medical issues. Please note that NO REFILLS for any discharge medications will be authorized once you are discharged, as it is imperative that you return to your primary care physician (or establish a relationship with a primary care physician if you do not have one)  for your aftercare needs so that they can reassess your need for medications and monitor your lab values.  Discharge Instructions   Call MD for:  difficulty breathing, headache or visual disturbances    Complete by:   As directed      Call MD for:  severe uncontrolled pain    Complete by:  As directed      Diet - low sodium heart healthy    Complete by:  As directed      Diet Carb Modified    Complete by:  As directed      Increase activity slowly    Complete by:  As directed             Medication List    STOP taking these medications       ibuprofen 800 MG tablet  Commonly known as:  ADVIL,MOTRIN     tadalafil 5 MG tablet  Commonly known as:  CIALIS      TAKE these medications       allopurinol 300 MG tablet  Commonly known as:  ZYLOPRIM  Take 300 mg by mouth daily.     aspirin EC 81 MG tablet  Take 1 tablet (81 mg total) by mouth daily.     budesonide-formoterol 160-4.5 MCG/ACT inhaler  Commonly known as:  SYMBICORT  Inhale 2 puffs into the lungs 2 (two) times daily.     colchicine 0.6 MG tablet  Take 0.6 mg by mouth daily as needed. For gout flare/pain     furosemide 20 MG tablet  Commonly known as:  LASIX  Take 20 mg by mouth daily as needed.     furosemide 20 MG tablet  Commonly known as:  LASIX  Take 1 tablet (20 mg total) by mouth daily as needed.     gabapentin 300 MG capsule  Commonly known as:  NEURONTIN  Take 300 mg by mouth 3 (three) times daily.     HYDROcodone-acetaminophen 7.5-325 MG per tablet  Commonly known as:  NORCO  Take 1 tablet by mouth daily as needed. For shoulder pain     isosorbide mononitrate 30 MG 24 hr tablet  Commonly known as:  IMDUR  Take 1 tablet (30 mg total) by mouth daily.     levothyroxine 100 MCG tablet  Commonly known as:  SYNTHROID, LEVOTHROID  Take 100 mcg by mouth daily.     metFORMIN 500 MG 24 hr tablet  Commonly known as:  GLUCOPHAGE-XR  Take 500 mg by mouth 2 (two) times daily.     multivitamin with minerals Tabs tablet  Take 1 tablet by mouth daily.     nebivolol 5 MG tablet  Commonly known as:  BYSTOLIC  Take 5 mg by mouth daily.     potassium chloride 10 MEQ tablet  Commonly known as:  K-DUR  Take 1  tablet (10 mEq total) by mouth daily as needed.     potassium chloride 10 MEQ tablet  Commonly known as:  K-DUR,KLOR-CON  Take 10 mEq by mouth 2 (two) times daily.     rivaroxaban 20 MG Tabs tablet  Commonly known as:  XARELTO  Take 1 tablet (20 mg total) by mouth daily.       Allergies  Allergen Reactions  . Nabumetone Hives       Follow-up Information   Follow up with Laqueta Linden, MD. (cardiology will call you for an appointment for stress test later next week)    Specialty:  Cardiology  Contact information:   [address] [place] [address] [phone]        The results of significant diagnostics from this hospitalization (including imaging, microbiology, ancillary and laboratory) are listed below for reference.    Significant Diagnostic Studies: Ct Angio Chest W/cm &/or Wo Cm  01/09/2014   CLINICAL DATA:  Chest pain, shortness of breath. Two episodes of hemoptysis. History of PE. IVC filter.  EXAM: CT ANGIOGRAPHY CHEST WITH CONTRAST  TECHNIQUE: Multidetector CT imaging of the chest was performed using the standard protocol during bolus administration of intravenous contrast. Multiplanar CT image reconstructions and MIPs were obtained to evaluate the vascular anatomy.  CONTRAST:  OMNIPAQUE IOHEXOL 350 MG/ML SOLN  COMPARISON:  Chest CT 09/10/2009.  FINDINGS: Markedly limited evaluation secondary to pulmonary motion artifact as well as quantum mottle artifact from body habitus. There is no evidence for central or main pulmonary arterial emboli. Evaluation of the lobar, segmental and subsegmental pulmonary arteries is markedly limited. Multiple suggestive filling defects within the segmental and subsegmental pulmonary arteries are likely secondary to quantum mottle and motion artifact.  The thyroid is massively enlarged with multiple areas of low attenuation and calcification. This exerts mass effect on the trachea. Bilateral gynecomastia. Multiple sub cm  mediastinal lymph nodes are present. Nonspecific 6 mm right hilar lymph node (image 50; series 4). Heart is enlarged. No pericardial effusion. Normal caliber aorta and main pulmonary artery.  Central airways are patent although there is mild narrowing of the trachea secondary to marked thyromegaly. Minimal dependent ground-glass opacities within the bilateral lower lobes. Small left pleural effusion.  Limited visualization of the upper abdomen demonstrates cholelithiasis. Hepatic steatosis. Small hiatal hernia.  No aggressive or acute appearing osseous lesions.  Review of the MIP images confirms the above findings.  IMPRESSION: Markedly limited evaluation secondary to pulmonary motion artifact and quantum mottle artifact from body habitus. No evidence for central, main or proximal lobar pulmonary emboli. Evaluation of the distal lobar, segmental and subsegmental pulmonary arteries is nondiagnostic due to the above described artifacts. Multiple suggestive filling defects within the segmental and subsegmental pulmonary arteries are likely secondary to quantum mottling motion artifact. Given patient's body habitus, consider further evaluation with V/Q scan as clinically indicated.  Multiple nonspecific sub cm right inferior hilar lymph nodes.  Possibly enlarged thyroid with multiple areas of low attenuation and calcification. Recommend correlation with thyroid ultrasound.  Small left pleural effusion.  Hepatic steatosis.  Cholelithiasis.  Critical Value/emergent results were called by telephone at the time of interpretation on 01/09/2014 at 6:44 pm to Dr. Rochele Raring , who verbally acknowledged these results.   Electronically Signed   By: Annia Belt M.D.   On: 01/09/2014 18:50   Nm Pulmonary Perf And Vent  01/10/2014   CLINICAL DATA:  Pulmonary embolism  EXAM: NUCLEAR MEDICINE VENTILATION - PERFUSION LUNG SCAN  TECHNIQUE: Ventilation images were obtained in multiple projections using inhaled aerosol technetium 99 M  DTPA. Perfusion images were obtained in multiple projections after intravenous injection of Tc-54m MAA.  RADIOPHARMACEUTICALS:  Forty-four mCi Tc-53m DTPA aerosol and 6.5 mCi Tc-2m MAA  COMPARISON:  Chest x-ray 01/09/2014  FINDINGS: Ventilation: No focal ventilation defect. There is air trapping and lung bases.  Perfusion: No wedge shaped peripheral perfusion defects to suggest acute pulmonary embolism.  Chest x-ray shows mild interstitial edema.  Findings are low probability for pulmonary embolus.  IMPRESSION: Low probability for pulmonary embolus.   Electronically Signed   By: Lanette Hampshire.D.  On: 01/10/2014 08:19   Dg Chest Portable 1 View  01/09/2014   CLINICAL DATA:  Chest pain and shortness of breath with history of pulmonary embolism and diabetes and prior tobacco use  EXAM: PORTABLE CHEST - 1 VIEW  COMPARISON:  PA and lateral chest x-ray of June 03, 2010.  FINDINGS: The cardiopericardial silhouette is enlarged. The pulmonary vascularity is engorged. The pulmonary interstitial markings are mildly increased. There is no pleural effusion. The trachea is midline. There is mild tortuosity of the descending thoracic aorta. The bony thorax is unremarkable.  IMPRESSION: CHF with mild interstitial edema. There is no evidence of pneumonia.   Electronically Signed   By: David  [place]   On: 01/09/2014 17:02    Microbiology: No results found for this or any previous visit (from the past 240 hour(s)).   Labs: Basic Metabolic Panel:  Recent Labs Lab 01/09/14 1635 01/10/14 0443  NA 138 140  K 4.2 4.2  CL 101 104  CO2 24 26  GLUCOSE 118* 117*  BUN 16 17  CREATININE 0.93 0.80  CALCIUM 9.1 8.6   Liver Function Tests: No results found for this basename: AST, ALT, ALKPHOS, BILITOT, PROT, ALBUMIN,  in the last 168 hours No results found for this basename: LIPASE, AMYLASE,  in the last 168 hours No results found for this basename: AMMONIA,  in the last 168 hours CBC:  Recent Labs Lab  01/09/14 1635 01/10/14 0443  WBC 7.9 7.6  HGB 14.0 13.6  HCT 41.4 40.0  MCV 94.3 94.6  PLT 182 160   Cardiac Enzymes:  Recent Labs Lab 01/09/14 1635 01/09/14 2134 01/10/14 0443 01/10/14 1004  TROPONINI <0.30 <0.30 <0.30 <0.30   BNP: BNP (last 3 results)  Recent Labs  01/09/14 1635  PROBNP 211.2*   CBG: No results found for this basename: GLUCAP,  in the last 168 hours     Signed:  Evian Derringer  [facility] 01/10/2014, 8:32 PM

## 2014-01-10 NOTE — Consult Note (Signed)
53 yr old male with history of tobacco use in past (quit 8 yrs ago), COPD, familial hypercoagulopathy, multiple PE's s/p IVC filter and on Xarelto, and morbid obesity admitted for recalcitrant chest tightness while sitting in his recliner, associated with bilateral arm/hand numbness, severe shortness of breath (markedly increased from baseline) and nausea/vomiting. He said, "It felt like there were 10,000 lbs on my chest". Took baby ASA with minimal relief but pain alleviated with three SL nitro, now rating it at 2-3/10.  Had coughing spells three weeks ago associated with hemoptysis. CT angio and VQ negative for PE (elevated d-dimer). Troponins were normal and ECG unremarkable. He also has a h/o GERD, achalasia cardia, and underwent myotomy in the past. Wife is a GI Engineer, civil (consulting) for [employer]. Pertinent family history includes sister having two stents placed in her 74's and mother with stent in early 40's.  RECS: Given his multiple cardiovascular risk factors and strong family h/o premature CAD, his symptoms are concerning for a potential cardiac etiology. As pain was relieved with nitro, I will start Imdur 30 mg daily. I have spoken with our echo tech and described his clinical scenario, and plan to obtain an echocardiogram to assess LV systolic function and regional wall motion. If his symptoms resolve within the next 24 hours, he can be discharged with a plan for outpatient Lexiscan Cardiolite stress test with subsequent follow up in our clinic. Patient is in agreement with this plan, and was discussed with Dr. Kerry Hough.

## 2014-01-10 NOTE — Progress Notes (Signed)
See discharge summary from later today  Frank Shelton  

## 2014-01-10 NOTE — Discharge Instructions (Signed)

## 2014-01-10 NOTE — Progress Notes (Signed)
Patient's IV removed.  Site WNL.  AVS reviewed with patient and patient's wife.  Verbalized understanding of discharge instructions, physician follow-up, medication, and stress test.  Instructed to stop taking Cialis while on Imdur, per Dr. Kerry Hough.  Verbalized understanding.  Patient's O2 sats after getting dressed and up to wheelchair were 95% on RA.  Patient reports all belonging intact and in possession at time of discharge.  Patient transported via w/c to main entrance for discharge.  Patient stable at time of discharge.

## 2014-01-10 NOTE — Plan of Care (Signed)
Problem: Food- and Nutrition-Related Knowledge Deficit (NB-1.1) Goal: Nutrition education Formal process to instruct or train a patient/client in a skill or to impart knowledge to help patients/clients voluntarily manage or modify food choices and eating behavior to maintain or improve health. Outcome: Adequate for Discharge Diet education consult received. Pt to be d/c home with spouse later today per nursing. Provided handouts including ,"sample meal plan, healthy grocery shopping list and eating healthy on a budget"  and booklet entitled "Nutrition in the fastlane". He is having numbness in his left arm currently and nursing is evaluating as well as cardiology has ordered test which is also being set up during my visit. If his discharge is delayed will follow-up to provide additional diet education and answer questions.    Wt Readings from Last 15 Encounters:  01/09/14 407 lb 3 oz (184.7 kg)  01/12/12 383 lb (173.728 kg)  08/02/10 364 lb 8 oz (165.336 kg)    Body mass index is 49.59 kg/(m^2). Patient meets criteria for obesity class III based on current BMI.   Current diet order is Heart Healthy, patient is consuming approximately 75-100% of meals at this time. Labs and medications reviewed.   RD contact provided and pt encouraged to follow up for additional nutrition education. Recommend he be refereed to [facility] as part of discharge planning.   Royann Shivers MS,RD,CSG,LDN Office: [phone] Pager: [phone]

## 2014-01-13 ENCOUNTER — Telehealth: Payer: Self-pay

## 2014-01-13 DIAGNOSIS — D689 Coagulation defect, unspecified: Secondary | ICD-10-CM

## 2014-01-13 DIAGNOSIS — R0789 Other chest pain: Secondary | ICD-10-CM

## 2014-01-13 NOTE — Telephone Encounter (Signed)
Apt sched for 9/1 ,register main entrance at 8:30 am  For 2 day protocol.  HOLD Metformin at 11 pm dose the night before and do not take 11 am on day of test  Pt has to arrange childs drop off at school for tomorrow,will call us back to confirm for tomorrow  I read instructions for lexi scan to pt since there is no time to mail instructions to him(see letter)  Kathlen Brunswick on Wed 9/23 at 10:40 in [place] office

## 2014-01-13 NOTE — Telephone Encounter (Signed)
Message copied by Nori Riis on Mon Jan 13, 2014  7:59 AM ------      Message from: Prentice Docker A      Created: Sat Jan 11, 2014  9:16 AM       Cathey,      Please schedule for a 2-day Lexiscan for chest pain and coagulopathy and have him f/u with me within the next 2 weeks. Thanks.            Darliss Ridgel            ----- Message -----         From: Erick Blinks, MD         Sent: 01/10/2014   8:36 PM           To: Laqueta Linden, MD            Hey man,            Friendly reminder: We discussed this patient  on 8/28 when I discharged him for chest pain and you had planned on scheduling him for a 2 day stress test on Monday.            Thanks.            Jehanzeb       ------

## 2014-01-14 ENCOUNTER — Encounter (HOSPITAL_COMMUNITY)
Admission: RE | Admit: 2014-01-14 | Discharge: 2014-01-14 | Disposition: A | Discharge status: Home / Self Care | Payer: Medicare PPO | Source: Ambulatory Visit | Attending: Cardiovascular Disease | Admitting: Cardiovascular Disease

## 2014-01-14 ENCOUNTER — Ambulatory Visit (HOSPITAL_COMMUNITY)
Admission: RE | Admit: 2014-01-14 | Discharge: 2014-01-14 | Disposition: A | Discharge status: Home / Self Care | Payer: Medicare PPO | Source: Ambulatory Visit | Attending: Cardiovascular Disease | Admitting: Cardiovascular Disease

## 2014-01-14 ENCOUNTER — Other Ambulatory Visit (HOSPITAL_COMMUNITY): Payer: Medicare PPO

## 2014-01-14 ENCOUNTER — Encounter (HOSPITAL_COMMUNITY): Payer: Self-pay

## 2014-01-14 DIAGNOSIS — D689 Coagulation defect, unspecified: Secondary | ICD-10-CM | POA: Insufficient documentation

## 2014-01-14 DIAGNOSIS — Z86711 Personal history of pulmonary embolism: Secondary | ICD-10-CM | POA: Insufficient documentation

## 2014-01-14 DIAGNOSIS — J449 Chronic obstructive pulmonary disease, unspecified: Secondary | ICD-10-CM | POA: Diagnosis not present

## 2014-01-14 DIAGNOSIS — R079 Chest pain, unspecified: Secondary | ICD-10-CM | POA: Diagnosis not present

## 2014-01-14 DIAGNOSIS — J4489 Other specified chronic obstructive pulmonary disease: Secondary | ICD-10-CM | POA: Insufficient documentation

## 2014-01-14 DIAGNOSIS — R0789 Other chest pain: Secondary | ICD-10-CM | POA: Insufficient documentation

## 2014-01-14 MED ORDER — TECHNETIUM TC 99M SESTAMIBI GENERIC - CARDIOLITE
30.0000 | Freq: Once | INTRAVENOUS | Status: AC | PRN
  Administered 2014-01-14: 30 via INTRAVENOUS

## 2014-01-14 MED ORDER — REGADENOSON 0.4 MG/5ML IV SOLN
0.4000 mg | Freq: Once | INTRAVENOUS | Status: AC | PRN
  Administered 2014-01-14: 0.4 mg via INTRAVENOUS

## 2014-01-14 MED ORDER — REGADENOSON 0.4 MG/5ML IV SOLN
INTRAVENOUS | Status: AC
  Administered 2014-01-14: 0.4 mg via INTRAVENOUS
  Filled 2014-01-14: qty 5

## 2014-01-14 MED ORDER — SODIUM CHLORIDE 0.9 % IJ SOLN
INTRAMUSCULAR | Status: AC
  Administered 2014-01-14: 10 mL via INTRAVENOUS
  Filled 2014-01-14: qty 10

## 2014-01-14 MED ORDER — TECHNETIUM TC 99M SESTAMIBI - CARDIOLITE: 25.0000 | Freq: Once | INTRAVENOUS | Status: DC | PRN

## 2014-01-14 MED ORDER — SODIUM CHLORIDE 0.9 % IJ SOLN
10.0000 mL | INTRAMUSCULAR | Status: DC | PRN
  Administered 2014-01-14: 10 mL via INTRAVENOUS

## 2014-01-14 NOTE — Progress Notes (Signed)
Stress Lab Nurses Notes - Frank Shelton  Frank Shelton 01/14/2014 Reason for doing test: Chest Pain Type of test: [name] Nurse performing test: Parke Poisson, RN Nuclear Medicine Tech: Lyndel Pleasure Echo Tech: Not Applicable MD performing test: Branch/K.Lyman Bishop NP Family MD: Byrd Hesselbach Test explained and consent signed: Yes.   IV started: Saline lock flushed, No redness or edema and Saline lock started in radiology Symptoms: nausea & Flushed Treatment/Intervention: None Reason test stopped: protocol completed After recovery IV was: Discontinued via X-ray tech and No redness or edema Patient to return to Nuc. Med at : 10:15 Patient discharged: Home Patient's Condition upon discharge was: stable Comments: During test BP 118/46 & HR 82.  Recovery BP 114/53 & HR 72.  Symptoms resolved in recovery.  Erskine Speed T

## 2014-01-15 ENCOUNTER — Encounter (HOSPITAL_COMMUNITY)
Admission: RE | Admit: 2014-01-15 | Discharge: 2014-01-15 | Disposition: A | Discharge status: Home / Self Care | Payer: Medicare PPO | Source: Ambulatory Visit | Attending: Cardiovascular Disease | Admitting: Cardiovascular Disease

## 2014-01-15 ENCOUNTER — Encounter (HOSPITAL_COMMUNITY): Payer: Self-pay

## 2014-01-15 DIAGNOSIS — R079 Chest pain, unspecified: Secondary | ICD-10-CM

## 2014-01-15 DIAGNOSIS — D689 Coagulation defect, unspecified: Secondary | ICD-10-CM | POA: Diagnosis present

## 2014-01-15 DIAGNOSIS — R0789 Other chest pain: Secondary | ICD-10-CM | POA: Diagnosis not present

## 2014-01-15 MED ORDER — TECHNETIUM TC 99M SESTAMIBI GENERIC - CARDIOLITE
25.0000 | Freq: Once | INTRAVENOUS | Status: AC | PRN
  Administered 2014-01-15: 26 via INTRAVENOUS

## 2014-01-21 ENCOUNTER — Other Ambulatory Visit (HOSPITAL_COMMUNITY): Payer: Medicare PPO

## 2014-01-30 ENCOUNTER — Ambulatory Visit (INDEPENDENT_AMBULATORY_CARE_PROVIDER_SITE_OTHER): Payer: Medicare PPO | Admitting: Cardiovascular Disease

## 2014-01-30 ENCOUNTER — Encounter: Payer: Self-pay | Admitting: Cardiovascular Disease

## 2014-01-30 ENCOUNTER — Other Ambulatory Visit: Payer: Self-pay | Admitting: *Deleted

## 2014-01-30 VITALS — BP 120/64 | HR 77 | Ht 76.0 in | Wt >= 6400 oz

## 2014-01-30 DIAGNOSIS — R079 Chest pain, unspecified: Secondary | ICD-10-CM

## 2014-01-30 DIAGNOSIS — E1149 Type 2 diabetes mellitus with other diabetic neurological complication: Secondary | ICD-10-CM

## 2014-01-30 DIAGNOSIS — Z87898 Personal history of other specified conditions: Secondary | ICD-10-CM

## 2014-01-30 DIAGNOSIS — I2699 Other pulmonary embolism without acute cor pulmonale: Secondary | ICD-10-CM

## 2014-01-30 DIAGNOSIS — I10 Essential (primary) hypertension: Secondary | ICD-10-CM

## 2014-01-30 DIAGNOSIS — E1140 Type 2 diabetes mellitus with diabetic neuropathy, unspecified: Secondary | ICD-10-CM

## 2014-01-30 DIAGNOSIS — Z136 Encounter for screening for cardiovascular disorders: Secondary | ICD-10-CM

## 2014-01-30 DIAGNOSIS — J438 Other emphysema: Secondary | ICD-10-CM

## 2014-01-30 DIAGNOSIS — E1142 Type 2 diabetes mellitus with diabetic polyneuropathy: Secondary | ICD-10-CM

## 2014-01-30 DIAGNOSIS — IMO0001 Reserved for inherently not codable concepts without codable children: Secondary | ICD-10-CM

## 2014-01-30 DIAGNOSIS — D689 Coagulation defect, unspecified: Secondary | ICD-10-CM

## 2014-01-30 DIAGNOSIS — Z9289 Personal history of other medical treatment: Secondary | ICD-10-CM

## 2014-01-30 MED ORDER — ISOSORBIDE MONONITRATE ER 30 MG PO TB24: 30.0000 mg | ORAL_TABLET | Freq: Every day | ORAL | Status: DC

## 2014-01-30 NOTE — Patient Instructions (Signed)
Your physician wants you to follow-up in: 6 months with Dr. Reggy Eye will receive a reminder letter in the mail two months in advance. If you don't receive a letter, please call our office to schedule the follow-up appointment.  Your physician recommends that you continue on your current medications as directed. Please refer to the Current Medication list given to you today.  I have refilled your Imdur for 90 day supply at [facility]  Thank you for choosing [facility]!!

## 2014-01-30 NOTE — Progress Notes (Signed)
Patient ID: Frank Shelton, male   DOB: 12/13/1960, 53 y.o.   MRN: [id number]      SUBJECTIVE: The patient presents for post-hospitalization follow up for chest pain.  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  He said, "It felt like there were 10,000 lbs on my chest". Took baby ASA with minimal relief but pain alleviated with three SL nitro. Had coughing spells three weeks prior associated with hemoptysis.  CT angio and VQ were negative for PE (elevated d-dimer). Troponins were normal and ECG was unremarkable.  He also has a h/o GERD, achalasia cardia, and underwent myotomy in the past. Wife is a GI Engineer, civil (consulting) for [employer].  Pertinent family history includes sister having two stents placed in her 12's and mother with stent in early 74's.   I obtained an echocardiogram which demonstrated normal LV systolic and diastolic function, normal regional wall motion, EF 60-65%, moderate to severe left atrial enlargement, and mild right ventricular enlargement.  He recently completed (9/1) an outpatient Lexiscan Cardiolite which did not show any evidence of myocardial ischemia or scar.  Since starting Imdur, he has had no further episodes of chest pain. His shortness of breath has also improved. He said, "I feel like I can go play with my kids again".  Review of Systems: As per "subjective", otherwise negative.  Allergies  Allergen Reactions  . Nabumetone Hives    Current Outpatient Prescriptions  Medication Sig Dispense Refill  . allopurinol (ZYLOPRIM) 300 MG tablet Take 300 mg by mouth daily.      Marland Kitchen aspirin EC 81 MG tablet Take 1 tablet (81  mg total) by mouth daily.  30 tablet  1  . budesonide-formoterol (SYMBICORT) 160-4.5 MCG/ACT inhaler Inhale 2 puffs into the lungs 2 (two) times daily.      . colchicine 0.6 MG tablet Take 0.6 mg by mouth daily as needed. For gout flare/pain      . furosemide (LASIX) 20 MG tablet Take 1 tablet (20 mg total) by mouth daily as needed.  30 tablet  0  . furosemide (LASIX) 20 MG tablet Take 20 mg by mouth daily as needed.      . gabapentin (NEURONTIN) 300 MG capsule Take 300 mg by mouth 3 (three) times daily.      Marland Kitchen HYDROcodone-acetaminophen (NORCO) 7.5-325 MG per tablet Take 1 tablet by mouth daily as needed. For shoulder pain      . isosorbide mononitrate (IMDUR) 30 MG 24 hr tablet Take 1 tablet (30 mg total) by mouth daily.  30 tablet  1  . levothyroxine (SYNTHROID, LEVOTHROID) 100 MCG tablet Take 100 mcg by mouth daily.      . metFORMIN (GLUCOPHAGE-XR) 500 MG 24 hr tablet Take 500 mg by mouth 2 (two) times daily.      . Multiple Vitamin (MULTIVITAMIN WITH MINERALS) TABS tablet Take 1 tablet by mouth daily.      . nebivolol (BYSTOLIC) 5 MG tablet Take 5 mg by mouth daily.      . potassium chloride (K-DUR) 10 MEQ tablet Take 1 tablet (10 mEq total) by mouth daily as needed.  30 tablet  0  . potassium chloride (K-DUR,KLOR-CON)  10 MEQ tablet Take 10 mEq by mouth 2 (two) times daily.      . Rivaroxaban (XARELTO) 20 MG TABS Take 1 tablet (20 mg total) by mouth daily.  30 tablet  1   No current facility-administered medications for this visit.    Past Medical History  Diagnosis Date  . Pulmonary embolism   . Embolism - blood clot   . Gout   . Sleep apnea   . Hypertension   . Diabetes mellitus without complication     Past Surgical History  Procedure Laterality Date  . Heller myotomy    . Skin graft      History   Social History  . Marital Status: Married    Spouse Name: N/A    Number of Children: N/A  . Years of Education: N/A   Occupational History  . Not on file.   Social  History Main Topics  . Smoking status: Former Smoker -- 1.00 packs/day for 30 years  . Smokeless tobacco: Former Neurosurgeon  . Alcohol Use: Yes     Comment: occasionally  . Drug Use: No  . Sexual Activity: Yes    Birth Control/ Protection: None   Other Topics Concern  . Not on file   Social History Narrative  . No narrative on file    BP 120/64 Pulse 77 Resp Temp Temp src SpO2 92% Weight 413 lb (187.336 kg) Height  (1.93 m)    PHYSICAL EXAM General: NAD, morbidly obese HEENT: Normal. Neck: No JVD, no thyromegaly. Lungs: Clear to auscultation bilaterally with normal respiratory effort. CV: Nondisplaced PMI.  Regular rate and rhythm, normal S1/S2, no S3/S4, no murmur. Trace pretibial and periankle edema.  No carotid bruit.  Normal pedal pulses.  Abdomen: Morbidly obese  Neurologic: Alert and oriented x 3.  Psych: Normal affect. Skin: Chronic lower extremity stasis dermatitis. Musculoskeletal: Normal range of motion, no gross deformities. Extremities: No clubbing or cyanosis.   ECG: Most recent ECG reviewed.      ASSESSMENT AND PLAN: 1. Chest pain: Given symptom stability and normal nuclear stress test with normal LV systolic function and regional wall motion, there is no indication for additional testing (invasive). Prior symptoms may have been secondary to microvascular angina. Continue Imdur 30 mg daily. 2. Bilateral PE s/p IVC filter: Continue Xarelto. No evidence of PE on recent CT angiography. 3. COPD: Stable. 4. Type 2 diabetes: On metformin. 5. Essential HTN: Controlled on Bystolic 5 mg daily.  Dispo: f/u 6 months.  Prentice Docker, M.D., F.A.C.C.

## 2014-02-05 ENCOUNTER — Ambulatory Visit: Payer: Medicare PPO | Admitting: Cardiovascular Disease

## 2014-02-12 ENCOUNTER — Telehealth: Payer: Self-pay | Admitting: *Deleted

## 2014-02-12 NOTE — Telephone Encounter (Signed)
error 

## 2014-02-27 ENCOUNTER — Encounter: Payer: Self-pay | Admitting: Orthopedic Surgery

## 2014-02-27 ENCOUNTER — Ambulatory Visit (INDEPENDENT_AMBULATORY_CARE_PROVIDER_SITE_OTHER): Payer: Medicare PPO

## 2014-02-27 ENCOUNTER — Ambulatory Visit (INDEPENDENT_AMBULATORY_CARE_PROVIDER_SITE_OTHER): Payer: Medicare PPO | Admitting: Orthopedic Surgery

## 2014-02-27 VITALS — BP 129/67 | Ht 76.0 in | Wt >= 6400 oz

## 2014-02-27 DIAGNOSIS — M180 Bilateral primary osteoarthritis of first carpometacarpal joints: Secondary | ICD-10-CM | POA: Insufficient documentation

## 2014-02-27 DIAGNOSIS — M79646 Pain in unspecified finger(s): Secondary | ICD-10-CM

## 2014-02-27 NOTE — Patient Instructions (Signed)
Wear braces x 6 weeks

## 2014-02-27 NOTE — Progress Notes (Signed)
New Patient  Chief Complaint  Patient presents with  . Hand Pain    This is a new patient to our practice. He is 53 years old is disabled because of frequent blood clots. He presents with bilateral thumb pain for 3-4 years right worse than left. He's received 3 injections in the right thumb and one on the left.  He complains of pain and stiffness with aching sharp activity related discomfort at the base of both thumbs  His review of systems Review of Systems  HENT: Positive for tinnitus.   Respiratory: Positive for shortness of breath.   Cardiovascular: Positive for chest pain.  Gastrointestinal: Positive for diarrhea and constipation.  Genitourinary: Positive for urgency and frequency.  Skin:       Chronic leg ulcers with open wounds  Neurological: Positive for dizziness and tingling.  Psychiatric/Behavioral: Positive for depression. The patient is nervous/anxious.   All other systems reviewed and are negative.  The patient is allergic to Relafen causes a rash  His family history diabetes asthma and reflux heart disease hypertension heart attack she blood clots and cancer  Social history she is disabled he does not smoke or drink  Medical history known for diabetes, asthma, GERD, hypertension, blood clots, peripheral vascular disease, depression,  arthritis and thyroid disease.  Previous surgery for achalasia. Right shoulder surgery. Skin graft. He did not list or brain any medications.  BP 129/67  Ht 6\' 4"  (1.93 m)  Wt 413 lb (187.336 kg)  BMI 50.29 kg/m2  General appearance is normal. Excellent grooming and hygiene. He is oriented x3. His mood is pleasant his affect is normal. He ambulates normally.  The right and left thumb are tender at the base of the thumb has painful range of motion of the [facility] joint he can still reach his small finger is no instability he has a positive grind test is no weakness in pinch skin is intact. Good pulse and perfusion in the right and left  hands no lymphangitis or lymphadenitis. There is no positive palpable lymph nodes in the epitrochlear region in his sensation is normal in both hands  Impression x-rays my interpretation is that he has mild basal joint arthritis  I injected both thumbs at his request in agreement  He should continue bracing. We gave him a left wrist brace ER he had a right wrist brace  Again he had 3 injections on the right 1 on the left. I did tell him that if he needs more than injection and bracing he would have to send him to a hand surgeon as I do not do that surgery.  Procedure note  Injection  Verbal consent was obtained to inject the  [facility] joint of the right and left thumb  Timeout procedure was completed to confirm injection site  Diagnosis CMC arthritis  Medications used Depo-Medrol 40 mg 1 cc Lidocaine 1% plain 3 cc  Anesthesia was provided by ethyl chloride spray  Prep was performed with alcohol  Technique of injection  after alcohol prep and ethyl chloride the medication was injected with a 25-gauge needle at the base of the thumb on the right and then repeated on the left   No complications were noted

## 2014-04-24 ENCOUNTER — Encounter (HOSPITAL_BASED_OUTPATIENT_CLINIC_OR_DEPARTMENT_OTHER): Payer: Medicare PPO | Attending: Internal Medicine

## 2014-08-26 ENCOUNTER — Telehealth: Payer: Self-pay | Admitting: Cardiovascular Disease

## 2014-08-26 NOTE — Telephone Encounter (Signed)
I have spoken with patient, he will stop by office to pick up [employer] patient assistance form for xarelto. I have also provided a 1 month supply of xarelto samples

## 2014-09-30 ENCOUNTER — Encounter (HOSPITAL_BASED_OUTPATIENT_CLINIC_OR_DEPARTMENT_OTHER): Payer: Medicare PPO

## 2014-10-01 ENCOUNTER — Telehealth: Payer: Self-pay | Admitting: Cardiovascular Disease

## 2014-10-01 NOTE — Telephone Encounter (Signed)
Patient is asking for Xarelto samples / tg

## 2014-10-09 ENCOUNTER — Encounter: Payer: Self-pay | Admitting: Cardiovascular Disease

## 2014-10-09 ENCOUNTER — Ambulatory Visit (INDEPENDENT_AMBULATORY_CARE_PROVIDER_SITE_OTHER): Payer: Medicare PPO | Admitting: Cardiovascular Disease

## 2014-10-09 VITALS — BP 132/64 | HR 77 | Ht 76.0 in | Wt >= 6400 oz

## 2014-10-09 DIAGNOSIS — I10 Essential (primary) hypertension: Secondary | ICD-10-CM | POA: Diagnosis not present

## 2014-10-09 DIAGNOSIS — R079 Chest pain, unspecified: Secondary | ICD-10-CM | POA: Diagnosis not present

## 2014-10-09 DIAGNOSIS — J438 Other emphysema: Secondary | ICD-10-CM

## 2014-10-09 DIAGNOSIS — R0602 Shortness of breath: Secondary | ICD-10-CM

## 2014-10-09 DIAGNOSIS — E1140 Type 2 diabetes mellitus with diabetic neuropathy, unspecified: Secondary | ICD-10-CM

## 2014-10-09 DIAGNOSIS — D689 Coagulation defect, unspecified: Secondary | ICD-10-CM

## 2014-10-09 DIAGNOSIS — I2699 Other pulmonary embolism without acute cor pulmonale: Secondary | ICD-10-CM

## 2014-10-09 NOTE — Progress Notes (Signed)
Patient ID: Frank Shelton, male   DOB: Apr 28, 1961, 54 y.o.   MRN: [id number]      SUBJECTIVE: The patient returns for follow up of chest pain. He denies any exertional chest pain whatsoever. He has chronic exertional dyspnea which is no worse than it has been for at least the past 2 years. He is treating himself for venous stasis ulcers bilaterally and showed me pictures of what they used to look like on his phone. He said they have now essentially healed and he has been bandaging his legs. He quit smoking years ago. He does not follow with a pulmonologist.   Review of Systems: As per "subjective", otherwise negative.  Allergies  Allergen Reactions  . Nabumetone Hives    Current Outpatient Prescriptions  Medication Sig Dispense Refill  . allopurinol (ZYLOPRIM) 300 MG tablet Take 300 mg by mouth daily.    Marland Kitchen. aspirin EC 81 MG tablet Take 1 tablet (81 mg total) by mouth daily. 30 tablet 1  . budesonide-formoterol (SYMBICORT) 160-4.5 MCG/ACT inhaler Inhale 2 puffs into the lungs 2 (two) times daily.    . colchicine 0.6 MG tablet Take 0.6 mg by mouth daily as needed. For gout flare/pain    . furosemide (LASIX) 20 MG tablet Take 20 mg by mouth daily as needed.    . gabapentin (NEURONTIN) 300 MG capsule Take 300 mg by mouth 3 (three) times daily.    . isosorbide mononitrate (IMDUR) 30 MG 24 hr tablet Take 1 tablet (30 mg total) by mouth daily. 90 tablet 3  . levothyroxine (SYNTHROID, LEVOTHROID) 100 MCG tablet Take 100 mcg by mouth daily.    . metFORMIN (GLUCOPHAGE-XR) 500 MG 24 hr tablet Take 500 mg by mouth 2 (two) times daily.    . Multiple Vitamin (MULTIVITAMIN WITH MINERALS) TABS tablet Take 1 tablet by mouth daily.    . nebivolol (BYSTOLIC) 5 MG tablet Take 5 mg by mouth daily.    . potassium chloride (K-DUR,KLOR-CON) 10 MEQ tablet Take 10 mEq by mouth 2 (two) times daily.    . Rivaroxaban (XARELTO) 20 MG TABS Take 1 tablet (20 mg total) by mouth daily. 30 tablet 1   No current  facility-administered medications for this visit.    Past Medical History  Diagnosis Date  . Pulmonary embolism   . Embolism - blood clot   . Gout   . Sleep apnea   . Hypertension   . Diabetes mellitus without complication     Past Surgical History  Procedure Laterality Date  . Heller myotomy    . Skin graft      History   Social History  . Marital Status: Married    Spouse Name: N/A  . Number of Children: N/A  . Years of Education: N/A   Occupational History  . Not on file.   Social History Main Topics  . Smoking status: Former Smoker -- 1.00 packs/day for 30 years    Start date: 01/30/1970    Quit date: 01/30/2006  . Smokeless tobacco: Former NeurosurgeonUser     Comment: 9/17 On and off since age 54- AJ  . Alcohol Use: No  . Drug Use: No  . Sexual Activity: Yes    Birth Control/ Protection: None   Other Topics Concern  . Not on file   Social History Narrative     Filed Vitals:   10/09/14 1047  BP: 132/64  Pulse: 77  Height: 6\' 4"  (1.93 m)  Weight: 409 lb (185.521 kg)  SpO2: 96%    PHYSICAL EXAM General: NAD, morbidly obese HEENT: Normal. Neck: No JVD, no thyromegaly. Lungs: Clear to auscultation bilaterally with normal respiratory effort. CV: Nondisplaced PMI. Regular rate and rhythm, normal S1/S2, no S3/S4, no murmur. Legs are bandaged. Abdomen: Morbidly obese  Neurologic: Alert and oriented x 3.  Psych: Normal affect. Musculoskeletal: Normal range of motion.   ECG: Most recent ECG reviewed.      ASSESSMENT AND PLAN: 1. Chest pain: No recurrences. Given symptom stability and normal nuclear stress test with normal LV systolic function and regional wall motion in 2015, there is no indication for additional testing (invasive). Prior symptoms may have been secondary to microvascular angina. Continue Imdur 30 mg daily.  2. Bilateral PE s/p IVC filter: Continue Xarelto. No evidence of PE on CT angiography in 2015.  3. COPD: Stable. Will make  pulmonary referral.  4. Type 2 diabetes: On metformin.  5. Essential HTN: Controlled on Bystolic 5 mg daily. No changes.  Dispo: f/u 1 year.  Prentice Docker, M.D., F.A.C.C.

## 2014-10-09 NOTE — Patient Instructions (Signed)
Your physician wants you to follow-up in: 1 year with Dr Reggy EyeKoneswaran You will receive a reminder letter in the mail two months in advance. If you don't receive a letter, please call our office to schedule the follow-up appointment.    Your physician recommends that you continue on your current medications as directed. Please refer to the Current Medication list given to you today.     You have been referred to pulmonary      Thank you for choosing [facility] !

## 2014-11-03 ENCOUNTER — Telehealth: Payer: Self-pay | Admitting: Cardiovascular Disease

## 2014-11-03 NOTE — Telephone Encounter (Signed)
Xarelto samples given,await Pt Assistance Program

## 2014-11-03 NOTE — Telephone Encounter (Signed)
Please call patient back regarding Xarelto / tg

## 2014-11-20 ENCOUNTER — Other Ambulatory Visit: Payer: Self-pay | Admitting: Cardiovascular Disease

## 2014-12-09 ENCOUNTER — Other Ambulatory Visit: Payer: Self-pay

## 2014-12-09 ENCOUNTER — Telehealth: Payer: Self-pay | Admitting: Cardiovascular Disease

## 2014-12-09 MED ORDER — RIVAROXABAN 20 MG PO TABS: 20.0000 mg | ORAL_TABLET | Freq: Every day | ORAL | Status: DC

## 2014-12-09 NOTE — Telephone Encounter (Signed)
Patient called back asking for nurse to contact him again

## 2014-12-09 NOTE — Telephone Encounter (Signed)
Patient has not if he was approved for Xarelto.  He may need samples he has 3 days

## 2014-12-09 NOTE — Telephone Encounter (Signed)
Not approved for assistance ,makes too much money. As pt is medicare/medicaid eligible we will run through [facility] in [place] to see cost to pt    LM for pt to call back

## 2015-08-10 ENCOUNTER — Other Ambulatory Visit: Payer: Self-pay | Admitting: Cardiovascular Disease

## 2015-10-07 ENCOUNTER — Other Ambulatory Visit: Payer: Self-pay | Admitting: Cardiovascular Disease

## 2016-01-29 ENCOUNTER — Other Ambulatory Visit: Payer: Self-pay | Admitting: Cardiovascular Disease

## 2016-04-21 ENCOUNTER — Other Ambulatory Visit: Payer: Self-pay | Admitting: Cardiovascular Disease

## 2016-06-03 ENCOUNTER — Other Ambulatory Visit: Payer: Self-pay | Admitting: Cardiovascular Disease

## 2016-06-14 ENCOUNTER — Telehealth: Payer: Self-pay | Admitting: Cardiovascular Disease

## 2016-06-14 NOTE — Telephone Encounter (Signed)
Pt is unable to afford his XARELTO 20 MG TABS tablet [[id number]][[id number]] XARELTO 20 MG TABS tablet [[id number]][[id number]] he'd like to know if he can get some samples.

## 2016-06-15 NOTE — Telephone Encounter (Signed)
Will fill out for pt assistance with his xarelto. Will provide samples.

## 2016-08-09 ENCOUNTER — Telehealth: Payer: Self-pay | Admitting: Cardiovascular Disease

## 2016-08-09 NOTE — Telephone Encounter (Signed)
Patient is asking for status of Xarelto Patient Assistance program. / tg

## 2016-08-10 NOTE — Telephone Encounter (Signed)
Assistance forms faxed to [employer]. Patient notified. 2 sample bottles placed at desk for pick up.

## 2016-08-30 ENCOUNTER — Telehealth: Payer: Self-pay | Admitting: *Deleted

## 2016-08-30 NOTE — Telephone Encounter (Signed)
Patient given 4 sample bottles of Xarelto  

## 2016-09-15 ENCOUNTER — Telehealth: Payer: Self-pay | Admitting: *Deleted

## 2016-09-15 NOTE — Telephone Encounter (Signed)
Spoke with [employer] patient assistance. Approval notice will be sent after review. Patient will have to pay 640 before assistance states.

## 2016-09-15 NOTE — Telephone Encounter (Signed)
Patient understands he has to pay OOP before medication is provided by drug assistance.He is going to get his OOP lost from his pharmacies and bring to [facility] [employer] LPN

## 2016-09-28 ENCOUNTER — Ambulatory Visit (INDEPENDENT_AMBULATORY_CARE_PROVIDER_SITE_OTHER): Payer: Medicare PPO | Admitting: Cardiovascular Disease

## 2016-09-28 ENCOUNTER — Encounter: Payer: Self-pay | Admitting: Cardiovascular Disease

## 2016-09-28 VITALS — BP 142/82 | HR 87 | Ht 76.0 in | Wt 392.0 lb

## 2016-09-28 DIAGNOSIS — E079 Disorder of thyroid, unspecified: Secondary | ICD-10-CM | POA: Diagnosis not present

## 2016-09-28 DIAGNOSIS — I2782 Chronic pulmonary embolism: Secondary | ICD-10-CM | POA: Diagnosis not present

## 2016-09-28 DIAGNOSIS — J438 Other emphysema: Secondary | ICD-10-CM | POA: Diagnosis not present

## 2016-09-28 DIAGNOSIS — I879 Disorder of vein, unspecified: Secondary | ICD-10-CM | POA: Diagnosis not present

## 2016-09-28 DIAGNOSIS — R072 Precordial pain: Secondary | ICD-10-CM | POA: Diagnosis not present

## 2016-09-28 DIAGNOSIS — I10 Essential (primary) hypertension: Secondary | ICD-10-CM

## 2016-09-28 DIAGNOSIS — E1140 Type 2 diabetes mellitus with diabetic neuropathy, unspecified: Secondary | ICD-10-CM | POA: Diagnosis not present

## 2016-09-28 DIAGNOSIS — D689 Coagulation defect, unspecified: Secondary | ICD-10-CM

## 2016-09-28 DIAGNOSIS — I83893 Varicose veins of bilateral lower extremities with other complications: Secondary | ICD-10-CM | POA: Diagnosis not present

## 2016-09-28 NOTE — Patient Instructions (Signed)
Medication Instructions:  STOP ASPIRIN   Labwork: NONE  Testing/Procedures: You have been referred to [facility] You have been referred to DR. NIDA FOR THYROID DISEASE     Follow-Up: Your physician wants you to follow-up in: 1 YEAR .  You will receive a reminder letter in the mail two months in advance. If you don't receive a letter, please call our office to schedule the follow-up appointment.   Any Other Special Instructions Will Be Listed Below (If Applicable).     If you need a refill on your cardiac medications before your next appointment, please call your pharmacy.

## 2016-09-28 NOTE — Progress Notes (Signed)
SUBJECTIVE: The patient presents for follow-up of chest pain and bilateral pulmonary embolism status post IVC filter. He also has COPD, type 2 diabetes, and hypertension. I last saw him in May 2016.  He has chronic venous stasis and is morbidly obese. He apparently went to see someone at [facility] and they recommended venous ablation but told him it would be $400 per procedure and he needed for procedures. He is not able to afford this. He takes Lasix 20 mg as needed when his legs feel tight. If he takes it daily it gives him significant cramps and he does not take it daily.  He denies chest pain. He has chronic exertional dyspnea. I previously referred him to pulmonology but he did not follow-up.  He also has thyroid disease but no longer follows with an endocrinologist.  ECG performed in the office today which I ordered and personally interpreted demonstrates normal sinus rhythm with no ischemic ST segment or T-wave abnormalities, nor any arrhythmias.    Review of Systems: As per "subjective", otherwise negative.  Allergies  Allergen Reactions  . Nabumetone Hives    Current Outpatient Prescriptions  Medication Sig Dispense Refill  . allopurinol (ZYLOPRIM) 300 MG tablet Take 300 mg by mouth daily.    Marland Kitchen aspirin EC 81 MG tablet Take 1 tablet (81 mg total) by mouth daily. 30 tablet 1  . budesonide-formoterol (SYMBICORT) 160-4.5 MCG/ACT inhaler Inhale 2 puffs into the lungs 2 (two) times daily.    . colchicine 0.6 MG tablet Take 0.6 mg by mouth daily as needed. For gout flare/pain    . furosemide (LASIX) 20 MG tablet Take 20 mg by mouth daily as needed.    . gabapentin (NEURONTIN) 300 MG capsule Take 300 mg by mouth 3 (three) times daily.    Marland Kitchen ibuprofen (ADVIL,MOTRIN) 800 MG tablet Take 800 mg by mouth every 8 (eight) hours as needed.    . isosorbide mononitrate (IMDUR) 30 MG 24 hr tablet TAKE 1 TABLET EVERY DAY 90 tablet 3  . levothyroxine (SYNTHROID, LEVOTHROID)  100 MCG tablet Take 100 mcg by mouth daily.    . metFORMIN (GLUCOPHAGE-XR) 500 MG 24 hr tablet Take 500 mg by mouth 2 (two) times daily.    . Multiple Vitamin (MULTIVITAMIN WITH MINERALS) TABS tablet Take 1 tablet by mouth daily.    . nebivolol (BYSTOLIC) 5 MG tablet Take 5 mg by mouth daily.    . potassium chloride (K-DUR,KLOR-CON) 10 MEQ tablet Take 10 mEq by mouth 2 (two) times daily.    Carlena Hurl 20 MG TABS tablet TAKE ONE TABLET BY MOUTH DAILY 30 tablet 3  . XARELTO 20 MG TABS tablet TAKE 1 TABLET (20 MG TOTAL) BY MOUTH DAILY. 30 tablet 0  . XARELTO 20 MG TABS tablet TAKE 1 TABLET (20 MG TOTAL) BY MOUTH DAILY. MUST MAKE FOLLOW UP APPOINTMENT FOR REFILLS 15 tablet 0  . XARELTO 20 MG TABS tablet TAKE 1 TABLET (20 MG TOTAL) BY MOUTH DAILY. 90 tablet 3   No current facility-administered medications for this visit.     Past Medical History:  Diagnosis Date  . Diabetes mellitus without complication (HCC)   . Embolism - blood clot   . Gout   . Hypertension   . Pulmonary embolism (HCC)   . Sleep apnea     Past Surgical History:  Procedure Laterality Date  . HELLER MYOTOMY    . SKIN GRAFT      Social History  Social History  . Marital status: Married    Spouse name: N/A  . Number of children: N/A  . Years of education: N/A   Occupational History  . Not on file.   Social History Main Topics  . Smoking status: Former Smoker    Packs/day: 1.00    Years: 30.00    Start date: 01/30/1970    Quit date: 01/30/2006  . Smokeless tobacco: Former NeurosurgeonUser     Comment: 9/17 On and off since age 56- AJ  . Alcohol use No  . Drug use: No  . Sexual activity: Yes    Birth control/ protection: None   Other Topics Concern  . Not on file   Social History Narrative  . No narrative on file     Vitals:   09/28/16 1138  BP: (!) 142/82  Pulse: 87  SpO2: 95%  Weight: (!) 392 lb (177.8 kg)  Height: 6\' 4"  (1.93 m)    Wt Readings from Last 3 Encounters:  09/28/16 (!) 392 lb (177.8 kg)   10/09/14 (!) 409 lb (185.5 kg)  02/27/14 (!) 413 lb (187.3 kg)     PHYSICAL EXAM General: NAD HEENT: Normal. Neck: No JVD, no thyromegaly. Lungs: Diminished throughout, no crackles or wheezes. CV: Nondisplaced PMI.  Regular rate and rhythm, normal S1/S2, no S3/S4, no murmur. Marked bilateral lower extremity edema with chronic venous stasis. Abdomen: Morbidly obese.  Neurologic: Alert and oriented.  Psych: Normal affect. Skin: Chronic venous stasis of lower extremities. Musculoskeletal: No gross deformities.    ECG: Most recent ECG reviewed.   Labs: Lab Results  Component Value Date/Time   K 4.2 01/10/2014 04:43 AM   BUN 17 01/10/2014 04:43 AM   CREATININE 0.80 01/10/2014 04:43 AM   ALT 24 06/01/2010 09:20 AM   TSH 5.010 (H) 01/10/2014 04:43 AM   HGB 13.6 01/10/2014 04:43 AM     Lipids: Lab Results  Component Value Date/Time   LDLCALC 27 01/10/2014 04:43 AM   CHOL 91 01/10/2014 04:43 AM   TRIG 193 (H) 01/10/2014 04:43 AM   HDL 25 (L) 01/10/2014 04:43 AM       ASSESSMENT AND PLAN: 1. Chest pain: Symptomatically stable on Imdur 30 mg daily. Previous stress testing was normal. No changes.  2. Bilateral PE s/p IVC filter: Continue Xarelto. No evidence of PE on CT angiography in 2015.  3. COPD: I previously made a pulmonary referral. He did not follow-up.  4. Type 2 diabetes: On metformin.  5. Essential HTN: Mildly elevated on Bystolic 5 mg. No changes for now. Needs weight loss.  6. Chronic lower extremity venous disease: He was told he needed several surgeries which were too expensive for him. He would like a second opinion. I will make a referral to vascular surgery in [place].  7. Thyroid disease with hypothyroidism: Currently on levothyroxine 100 g daily. I will make a referral to endocrinology, Dr.Nida.     Disposition: Follow up 1 year  Prentice DockerSuresh Vy Badley, M.D., F.A.C.C.

## 2016-10-03 ENCOUNTER — Other Ambulatory Visit: Payer: Self-pay | Admitting: *Deleted

## 2016-10-03 DIAGNOSIS — I872 Venous insufficiency (chronic) (peripheral): Secondary | ICD-10-CM

## 2016-10-14 ENCOUNTER — Encounter: Payer: Self-pay | Admitting: "Endocrinology

## 2016-10-20 ENCOUNTER — Encounter: Payer: Self-pay | Admitting: Vascular Surgery

## 2016-10-28 ENCOUNTER — Ambulatory Visit (HOSPITAL_COMMUNITY): Payer: Medicare PPO

## 2016-10-28 ENCOUNTER — Encounter: Payer: Medicare PPO | Admitting: Vascular Surgery

## 2016-11-22 ENCOUNTER — Other Ambulatory Visit: Payer: Self-pay | Admitting: Cardiovascular Disease

## 2016-12-02 ENCOUNTER — Encounter: Payer: Self-pay | Admitting: Surgery

## 2016-12-12 ENCOUNTER — Ambulatory Visit (HOSPITAL_COMMUNITY)
Admission: RE | Admit: 2016-12-12 | Discharge: 2016-12-12 | Disposition: A | Discharge status: Home / Self Care | Payer: Medicare PPO | Source: Ambulatory Visit | Attending: Surgery | Admitting: Surgery

## 2016-12-12 ENCOUNTER — Encounter: Payer: Self-pay | Admitting: Surgery

## 2016-12-12 ENCOUNTER — Ambulatory Visit (INDEPENDENT_AMBULATORY_CARE_PROVIDER_SITE_OTHER): Payer: Medicare PPO | Admitting: Surgery

## 2016-12-12 VITALS — BP 129/76 | HR 85 | Temp 97.0°F | Resp 20 | Ht 76.0 in | Wt 395.7 lb

## 2016-12-12 DIAGNOSIS — I83893 Varicose veins of bilateral lower extremities with other complications: Secondary | ICD-10-CM | POA: Diagnosis not present

## 2016-12-12 DIAGNOSIS — I872 Venous insufficiency (chronic) (peripheral): Secondary | ICD-10-CM | POA: Insufficient documentation

## 2016-12-12 NOTE — Progress Notes (Addendum)
HISTORY AND PHYSICAL     CC:  Leg swelling Requesting Provider:  Vennie Homans, MD  HPI: This is a 56 y.o. male who presents today for evaluation for bilateral leg swelling.  He states that in 1986, he was hit with a forklift in the left leg and several months later he developed DVT in that leg and began to have swelling after that.  In 1988, he got a DVT in the right leg and developed swelling in that leg as well.  He has had swelling ever since in both legs with hx of ulcerations over the years.  He does have an ulceration on the right anterior shin that has been present for about 4 days that he continues to treat with compression and elevation as well as a topical agent that he has received from the wound care center.   He states that he had been evaluated in [place] for his leg swelling and was told that he was be in the hospital and a procedure every other day and would be $500 per procedure up from.  He states he is on disability and does not have that to pay.    He states that he wears knee high compression stockings most of the day and night.  He takes them off for a couple of hours a day.    He has a remote tobacco hx and quit in 2007.   He is on Metformin for diabetes.  He does not know his A1c.    He is on synthroid for thyroid disease.    He is on Xarelto for hx of DVT.   He also has hx of PE and IVC filter placement.    He is followed by Dr. Purvis Sheffield for hx of chest pain.  He is on Bystolic for essential HTN.   He does have COPD and Dr. Purvis Sheffield made referral for a pulmonologist to follow him.    There is a family hx of varicose veins & diabetes.  in his maternal GF.  His mother and sister have a hx of heart disease before the age of 54.  His maternal GF and GM had hx of DVT.  Past Medical History:  Diagnosis Date  . Diabetes mellitus without complication (HCC)   . Embolism - blood clot   . Gout   . Hypertension   . Pulmonary embolism (HCC)   . Sleep apnea      Past Surgical History:  Procedure Laterality Date  . HELLER MYOTOMY    . ROTATOR CUFF REPAIR Right   . SKIN GRAFT      Allergies  Allergen Reactions  . Nabumetone Hives    Relafen    Current Outpatient Prescriptions  Medication Sig Dispense Refill  . allopurinol (ZYLOPRIM) 300 MG tablet Take 300 mg by mouth daily.    . budesonide-formoterol (SYMBICORT) 160-4.5 MCG/ACT inhaler Inhale 2 puffs into the lungs 2 (two) times daily.    . colchicine 0.6 MG tablet Take 0.6 mg by mouth daily as needed. For gout flare/pain    . Cyanocobalamin (B-12) 5000 MCG CAPS Take by mouth.    . furosemide (LASIX) 20 MG tablet Take 20 mg by mouth daily as needed.    . gabapentin (NEURONTIN) 300 MG capsule Take 600 mg by mouth 3 (three) times daily.     Marland Kitchen ibuprofen (ADVIL,MOTRIN) 800 MG tablet Take 800 mg by mouth every 8 (eight) hours as needed.    . isosorbide mononitrate (IMDUR) 30 MG 24  hr tablet TAKE 1 TABLET EVERY DAY 90 tablet 3  . levothyroxine (SYNTHROID, LEVOTHROID) 100 MCG tablet Take 100 mcg by mouth daily.    . metFORMIN (GLUCOPHAGE-XR) 500 MG 24 hr tablet Take 500 mg by mouth 2 (two) times daily.    . Multiple Vitamin (MULTIVITAMIN WITH MINERALS) TABS tablet Take 1 tablet by mouth daily.    . nebivolol (BYSTOLIC) 5 MG tablet Take 5 mg by mouth daily.    . potassium chloride (K-DUR,KLOR-CON) 10 MEQ tablet Take 10 mEq by mouth 2 (two) times daily.    . Vitamin D, Ergocalciferol, (DRISDOL) 50000 units CAPS capsule Take 10,000 Units by mouth every 7 (seven) days.    Carlena Hurl. XARELTO 20 MG TABS tablet TAKE ONE TABLET BY MOUTH DAILY 30 tablet 3   No current facility-administered medications for this visit.     Family History  Problem Relation Age of Onset  . Heart attack Sister 30       Two stents  . Heart attack Mother   . Heart attack Maternal Grandmother   . Heart attack Maternal Grandfather     Social History   Social History  . Marital status: Married    Spouse name: N/A  . Number  of children: N/A  . Years of education: N/A   Occupational History  . Not on file.   Social History Main Topics  . Smoking status: Former Smoker    Packs/day: 1.00    Years: 30.00    Start date: 01/30/1970    Quit date: 01/30/2006  . Smokeless tobacco: Former NeurosurgeonUser     Comment: 9/17 On and off since age 56- AJ  . Alcohol use No  . Drug use: No  . Sexual activity: Yes    Birth control/ protection: None   Other Topics Concern  . Not on file   Social History Narrative  . No narrative on file     REVIEW OF SYSTEMS:   [X]  denotes positive finding, [ ]  denotes negative finding Cardiac  Comments:  Chest pain or chest pressure:    Shortness of breath upon exertion: x   Short of breath when lying flat:    Irregular heart rhythm:        Vascular    Pain in calf, thigh, or hip brought on by ambulation: x   Pain in feet at night that wakes you up from your sleep:  x   Blood clot in your veins: x   Leg swelling:  x       Pulmonary    Oxygen at home:    Productive cough:     Wheezing:         Neurologic    Sudden weakness in arms or legs:     Sudden numbness in arms or legs:     Sudden onset of difficulty speaking or slurred speech:    Temporary loss of vision in one eye:     Problems with dizziness:         Gastrointestinal    Blood in stool:     Vomited blood:         Genitourinary    Burning when urinating:     Blood in urine:        Psychiatric    Major depression:         Hematologic    Bleeding problems:    Problems with blood clotting too easily:        Skin    Rashes  or ulcers:        Constitutional    Fever or chills:      PHYSICAL EXAMINATION:  Vitals:   12/12/16 1340  BP: 129/76  Pulse: 85  Resp: 20  Temp: (!) 97 F (36.1 C)   Body mass index is 48.17 kg/m.  General:  WDWN in NAD; vital signs documented above Gait: Not observed HENT: WNL, normocephalic Pulmonary: normal non-labored breathing , without Rales, rhonchi,   wheezing Cardiac: regular HR, without  Murmurs, rubs or gallops; without carotid bruits Abdomen: soft, NT, no masses Skin: without rashes Vascular Exam/Pulses:  Right Left  Radial 2+ (normal) 2+ (normal)  Ulnar 2+ (normal) 2+ (normal)  Popliteal Unable to palpate  Unable to palpate  DP 2+ (normal) 2+ (normal)  PT Unable to palpate  Unable to palpate    Extremities: superficial varicosities present bilateral lower extremities with hemosiderin changes bilaterally.      Musculoskeletal: no muscle wasting or atrophy  Neurologic: A&O X 3;  No focal weakness or paresthesias are detected Psychiatric:  The pt has Normal affect.   Non-Invasive Vascular Imaging:   Lower extremity venous duplex reflux evaluation 12/12/16: 1.  Evidence of chronic superficial thrombus bilateral great superficial veins 2.  No evidence of DVT 3.  Venous incompetence noted in the bilateral femoral, popliteal, saphenofemoral junctions, GSV, SSV, and CFV's.    Measurements of GSV: Right:  0.35cm (Mid Calf) - 1.14 (distal thigh) Left:  0.50cm (Mid Calf) - 1.01cm (distal thigh)  Measurements of SSV: Right:  0.25cm-0.56cm Left:  0.35cm-0.37cm  Pt meds includes: Statin:  No. Beta Blocker:  Yes.   Aspirin:  No. ACEI:  No. ARB:  No. CCB use:  No Other Antiplatelet/Anticoagulant:  Yes Xarelto   ASSESSMENT/PLAN:: 56 y.o. male with chronic bilateral leg swelling   -pt with chronic venous stasis changes and hx of venous ulcerations on bilateral lower extremities.  Presently, he keeps his swelling in check with knee high compression stockings and elevation. -he does have a hx of bilateral DVT/PE with IVC filter placement and is on Xarelto. -he will need a laser ablation in the future.  He has been measured today for thigh high 20-6430mmHg compression stockings and given an Rx.  He will continue leg elevation. -he is also given a pamphlet for Elastic Therapy -we will see him back in 3 months   Doreatha MassedSamantha  Jaquavius Hudler, PA-C [facility] [phone]  Clinic MD:  Pt seen and examined with Dr. Myra GianottiBrabham   I agree with the above.  I have seen and evaluated the patient.  He has a history of bilateral lower extremity DVT as well as chronic leg swelling, CEAP class V.  He has recently healed ulcers with eschar on the right lateral lower leg and hyperpigmentation.  He does have some prominent varicosities around the knee bilaterally.  Reflux evaluation today shows significant reflux in the deep system as well as the superficial system, within the bilateral great saphenous veins with diameter measurements greater than 1 bilaterally.  There is also reflux in the small saphenous vein bilaterally.  I think the patient would benefit from endovenous laser ablation of bilateral great saphenous vein.  First, we will try him and 20-30 thigh-high compression stockings to see how much relief he gets.  He'll follow up with us in 3 months for further discussions.  Durene CalWells Brabham

## 2017-01-10 ENCOUNTER — Encounter: Payer: Self-pay | Admitting: "Endocrinology

## 2017-01-10 ENCOUNTER — Ambulatory Visit (INDEPENDENT_AMBULATORY_CARE_PROVIDER_SITE_OTHER): Payer: Medicare PPO | Admitting: "Endocrinology

## 2017-01-10 VITALS — BP 140/79 | HR 76 | Ht 76.0 in | Wt 393.0 lb

## 2017-01-10 DIAGNOSIS — E1159 Type 2 diabetes mellitus with other circulatory complications: Secondary | ICD-10-CM

## 2017-01-10 DIAGNOSIS — E039 Hypothyroidism, unspecified: Secondary | ICD-10-CM | POA: Diagnosis not present

## 2017-01-10 DIAGNOSIS — E049 Nontoxic goiter, unspecified: Secondary | ICD-10-CM | POA: Diagnosis not present

## 2017-01-10 DIAGNOSIS — E119 Type 2 diabetes mellitus without complications: Secondary | ICD-10-CM | POA: Insufficient documentation

## 2017-01-10 LAB — RENAL FUNCTION PANEL
Albumin: 3.8 g/dL (ref 3.6–5.1)
BUN: 13 mg/dL (ref 7–25)
CHLORIDE: 105 mmol/L (ref 98–110)
CO2: 23 mmol/L (ref 20–32)
CREATININE: 0.84 mg/dL (ref 0.70–1.33)
Calcium: 8.6 mg/dL (ref 8.6–10.3)
GLUCOSE: 191 mg/dL — AB (ref 65–99)
Phosphorus: 2.8 mg/dL (ref 2.5–4.5)
Potassium: 4.2 mmol/L (ref 3.5–5.3)
Sodium: 137 mmol/L (ref 135–146)

## 2017-01-10 NOTE — Progress Notes (Signed)
Subjective:    Patient ID: Frank Shelton, male    DOB: 1960/06/09,  Past Medical History:  Diagnosis Date  . Diabetes mellitus without complication (HCC)   . Embolism - blood clot   . Gout   . Hypertension   . Pulmonary embolism (HCC)   . Sleep apnea    Past Surgical History:  Procedure Laterality Date  . HELLER MYOTOMY    . ROTATOR CUFF REPAIR Right   . SKIN GRAFT     Social History   Social History  . Marital status: Married    Spouse name: N/A  . Number of children: N/A  . Years of education: N/A   Social History Main Topics  . Smoking status: Former Smoker    Packs/day: 1.00    Years: 30.00    Start date: 01/30/1970    Quit date: 01/30/2006  . Smokeless tobacco: Former Neurosurgeon     Comment: 9/17 On and off since age 56- AJ  . Alcohol use No  . Drug use: No  . Sexual activity: Yes    Birth control/ protection: None   Other Topics Concern  . None   Social History Narrative  . None   Outpatient Encounter Prescriptions as of 01/10/2017  Medication Sig  . allopurinol (ZYLOPRIM) 300 MG tablet Take 300 mg by mouth daily.  . budesonide-formoterol (SYMBICORT) 160-4.5 MCG/ACT inhaler Inhale 2 puffs into the lungs 2 (two) times daily.  . colchicine 0.6 MG tablet Take 0.6 mg by mouth daily as needed. For gout flare/pain  . Cyanocobalamin (B-12) 5000 MCG CAPS Take by mouth.  . furosemide (LASIX) 20 MG tablet Take 20 mg by mouth daily as needed.  . gabapentin (NEURONTIN) 300 MG capsule Take 600 mg by mouth 3 (three) times daily.   Marland Kitchen ibuprofen (ADVIL,MOTRIN) 800 MG tablet Take 800 mg by mouth every 8 (eight) hours as needed.  . isosorbide mononitrate (IMDUR) 30 MG 24 hr tablet TAKE 1 TABLET EVERY DAY  . levothyroxine (SYNTHROID, LEVOTHROID) 100 MCG tablet Take 100 mcg by mouth daily.  . metFORMIN (GLUCOPHAGE-XR) 500 MG 24 hr tablet Take 500 mg by mouth 2 (two) times daily.  . Multiple Vitamin (MULTIVITAMIN WITH MINERALS) TABS tablet Take 1 tablet by mouth daily.  .  nebivolol (BYSTOLIC) 5 MG tablet Take 5 mg by mouth daily.  . potassium chloride (K-DUR,KLOR-CON) 10 MEQ tablet Take 10 mEq by mouth 2 (two) times daily.  . Vitamin D, Ergocalciferol, (DRISDOL) 50000 units CAPS capsule Take 10,000 Units by mouth every 7 (seven) days.  Carlena Hurl 20 MG TABS tablet TAKE ONE TABLET BY MOUTH DAILY   No facility-administered encounter medications on file as of 01/10/2017.    ALLERGIES: Allergies  Allergen Reactions  . Nabumetone Hives    Relafen    VACCINATION STATUS: Immunization History  Administered Date(s) Administered  . Influenza Whole 01/14/2010  . Pneumococcal Polysaccharide-23 01/14/2010    HPI Frank Shelton is 56 y.o. male who presents today with a medical history as above. he is being seen in consultation for Long-standing history of hypothyroidism requested by his cardiologist Dr. Purvis Sheffield.  - He reports that he was diagnosed with hypothyroidism at approximate age of 38 years, treated with various doses of levothyroxine over the years currently on levothyroxine 100 g by mouth every morning. He reports compliance to this medication, taking it always  in the morning on  empty stomach with water. - He does not have recent thyroid function test to review.  He also reports long-standing problem with goiter, no recent thyroid sonograms to review.   he has been dealing with symptoms of  dysphagia, choking,  shortness of breath intermittently several years. - He denies any significant voice change recently. -He denies family history of thyroid dysfunction, no family history of thyroid cancer. - He has history of type 2 diabetes diagnosed at approximate age of 56 years, completed by coronary artery disease, stroke, obesity for most of his adult life. - He does not know his recent A1c, he is on metformin 500 mg by mouth twice a day. - He complains of inability to lose weight, intermittent constipation. He denies heat/cold intolerance.   Review of  Systems  Constitutional: +  Overweight/obese most of his adult life, + fatigue, no subjective hyperthermia, no subjective hypothermia Eyes: no blurry vision, no xerophthalmia ENT: no sore throat, + nodules palpated in throat, + dysphagia, +odynophagia, no hoarseness Cardiovascular: no Chest Pain, no Shortness of Breath, no palpitations, no leg swelling Respiratory: + cough, no SOB Gastrointestinal: no Nausea/Vomiting/Diarhhea Musculoskeletal: no muscle/joint aches Skin: no rashes Neurological: no tremors, no numbness, no tingling, no dizziness Psychiatric: no depression, no anxiety  Objective:    BP 140/79   Pulse 76   Ht 6\' 4"  (1.93 m)   Wt (!) 393 lb (178.3 kg)   BMI 47.84 kg/m   Wt Readings from Last 3 Encounters:  01/10/17 (!) 393 lb (178.3 kg)  12/12/16 (!) 395 lb 11.2 oz (179.5 kg)  09/28/16 (!) 392 lb (177.8 kg)    Physical Exam  Constitutional: + obese, not in acute distress, normal state of mind Eyes: PERRLA, EOMI, no exophthalmos ENT: moist mucous membranes, + huge thyromegaly, no cervical lymphadenopathy Cardiovascular: normal precordial activity,  +  distant heart sounds  , no Murmur/Rubs/Gallops Respiratory:  adequate breathing efforts, no gross chest deformity, Clear to auscultation bilaterally Gastrointestinal: +abdomen is obese, soft, Non -tender, No distension, Bowel Sounds present Musculoskeletal: no gross deformities, strength intact in all four extremities Skin: moist, warm, no rashes, + skin signs of chronic venous insufficiency on bilateral lower extremities which are large and non- pitting.  Neurological: no tremor with outstretched hands, Deep tendon reflexes normal in all four extremities.  CMP ( most recent) CMP     Component Value Date/Time   NA 140 01/10/2014 0443   K 4.2 01/10/2014 0443   CL 104 01/10/2014 0443   CO2 26 01/10/2014 0443   GLUCOSE 117 (H) 01/10/2014 0443   BUN 17 01/10/2014 0443   CREATININE 0.80 01/10/2014 0443   CALCIUM  8.6 01/10/2014 0443   PROT 6.1 06/01/2010 0920   ALBUMIN 3.3 (L) 06/01/2010 0920   AST 21 06/01/2010 0920   ALT 24 06/01/2010 0920   ALKPHOS 61 06/01/2010 0920   BILITOT 0.5 06/01/2010 0920   GFRNONAA >90 01/10/2014 0443   GFRAA >90 01/10/2014 0443     Diabetic Labs (most recent): Lab Results  Component Value Date   HGBA1C 7.2 (H) 01/10/2014   HGBA1C 6.1 (H) 01/12/2012     Lipid Panel ( most recent) Lipid Panel     Component Value Date/Time   CHOL 91 01/10/2014 0443   TRIG 193 (H) 01/10/2014 0443   HDL 25 (L) 01/10/2014 0443   CHOLHDL 3.6 01/10/2014 0443   VLDL 39 01/10/2014 0443   LDLCALC 27 01/10/2014 0443      Assessment & Plan:   1. Hypothyroidism, unspecified type - Patient with long-standing hypothyroidism at least since age of 38 years.  He is currently on 100 mg and levothyroxine.  - I will proceed to obtain new set of thyroid function tests including TSH, free T4, thyroglobulin antibodies, TPO antibodies. - In the meantime, I advised him to continue levothyroxine 100 g by mouth every morning.  - We discussed about correct intake of levothyroxine, at fasting, with water, separated by at least 30 minutes from breakfast, and separated by more than 4 hours from calcium, iron, multivitamins, acid reflux medications (PPIs). -Patient is made aware of the fact that thyroid hormone replacement is needed for life, dose to be adjusted by periodic monitoring of thyroid function tests.  2. Morbid obesity (HCC) -  Complicated by Type 2 diabetes since at least age 75, currently on metformin 500 mg by mouth twice a day. - no recent A1c to review. I advised him to continue metformin 500 mg by mouth twice a day. Suggestion is made for him to avoid simple carbohydrates  from his diet including Cakes, Sweet Desserts, Ice Cream, Soda (diet and regular), Sweet Tea, Candies, Chips, Cookies, Store Bought Juices, Alcohol in Excess of  1-2 drinks a day, Artificial Sweeteners, and  "Sugar-free" Products. This will help patient to have stable blood glucose profile and potentially avoid unintended weight gain.  3. DM type 2 causing vascular disease (HCC) - See #2 above - I'll proceed to obtain new labs for renal functioning A1c as well as 25 hydroxy vitamin D.   4. Goiter - Physical exam is significant for massive goiter changing the anatomy of his neck. - I'll proceed to obtain baseline thyroid ultrasound. Due to the big size of the thyroid, he may benefit from thyroidectomy with will be discussed during his next visit.   - I did not initiate any new prescriptions today. He'll return in 2 weeks with his ultrasound and lab work for reevaluation and treatment adjustment if necessary.   - Time spent with the patient: 45 minutes, of which >50% was spent in obtaining information about his symptoms, reviewing his previous labs, evaluations, and treatments, counseling him about his  hypothyroidism, type 2 diabetes, goiter, and developing a plan for long term treatment.  - I advised patient to maintain close follow up with his cardiologist and his PCP for primary care needs. Follow up plan: Return in about 2 weeks (around 01/24/2017) for Thyroid / Neck Ultrasound, labs today.  Marquis Lunch, MD [facility] Phone: [phone]  Fax: [phone]   01/10/2017, 1:16 PM This note was partially dictated with voice recognition software. Similar sounding words can be transcribed inadequately or may not  be corrected upon review.

## 2017-01-10 NOTE — Patient Instructions (Signed)

## 2017-01-11 LAB — TSH: TSH: 4.04 m[IU]/L (ref 0.40–4.50)

## 2017-01-11 LAB — T4, FREE: Free T4: 1.1 ng/dL (ref 0.8–1.8)

## 2017-01-11 LAB — HEMOGLOBIN A1C
Hgb A1c MFr Bld: 7.5 % — ABNORMAL HIGH (ref <5.7)
Mean Plasma Glucose: 169 mg/dL

## 2017-01-11 LAB — VITAMIN D 25 HYDROXY (VIT D DEFICIENCY, FRACTURES): Vit D, 25-Hydroxy: 54 ng/mL (ref 30–100)

## 2017-01-11 LAB — THYROGLOBULIN ANTIBODY

## 2017-01-11 LAB — THYROID PEROXIDASE ANTIBODY: Thyroperoxidase Ab SerPl-aCnc: 4 IU/mL (ref <9)

## 2017-01-20 ENCOUNTER — Ambulatory Visit (HOSPITAL_COMMUNITY)
Admission: RE | Admit: 2017-01-20 | Discharge: 2017-01-20 | Disposition: A | Discharge status: Home / Self Care | Payer: Medicare PPO | Source: Ambulatory Visit | Attending: "Endocrinology | Admitting: "Endocrinology

## 2017-01-20 DIAGNOSIS — E042 Nontoxic multinodular goiter: Secondary | ICD-10-CM | POA: Diagnosis not present

## 2017-01-20 DIAGNOSIS — E039 Hypothyroidism, unspecified: Secondary | ICD-10-CM

## 2017-01-26 ENCOUNTER — Ambulatory Visit (INDEPENDENT_AMBULATORY_CARE_PROVIDER_SITE_OTHER): Payer: Medicare PPO | Admitting: "Endocrinology

## 2017-01-26 ENCOUNTER — Encounter: Payer: Self-pay | Admitting: "Endocrinology

## 2017-01-26 VITALS — BP 140/81 | HR 72 | Ht 76.0 in | Wt 391.0 lb

## 2017-01-26 DIAGNOSIS — E042 Nontoxic multinodular goiter: Secondary | ICD-10-CM

## 2017-01-26 DIAGNOSIS — E1159 Type 2 diabetes mellitus with other circulatory complications: Secondary | ICD-10-CM | POA: Diagnosis not present

## 2017-01-26 DIAGNOSIS — E039 Hypothyroidism, unspecified: Secondary | ICD-10-CM | POA: Diagnosis not present

## 2017-01-26 MED ORDER — LEVOTHYROXINE SODIUM 125 MCG PO TABS: 125.0000 ug | ORAL_TABLET | Freq: Every day | ORAL | 3 refills | Status: DC

## 2017-01-26 NOTE — Progress Notes (Signed)
Subjective:    Patient ID: Frank Shelton, male    DOB: Oct 13, 1960,  Past Medical History:  Diagnosis Date  . Diabetes mellitus without complication (HCC)   . Embolism - blood clot   . Gout   . Hypertension   . Pulmonary embolism (HCC)   . Sleep apnea    Past Surgical History:  Procedure Laterality Date  . HELLER MYOTOMY    . ROTATOR CUFF REPAIR Right   . SKIN GRAFT     Social History   Social History  . Marital status: Married    Spouse name: N/A  . Number of children: N/A  . Years of education: N/A   Social History Main Topics  . Smoking status: Former Smoker    Packs/day: 1.00    Years: 30.00    Start date: 01/30/1970    Quit date: 01/30/2006  . Smokeless tobacco: Former Neurosurgeon     Comment: 9/17 On and off since age 29- AJ  . Alcohol use No  . Drug use: No  . Sexual activity: Yes    Birth control/ protection: None   Other Topics Concern  . None   Social History Narrative  . None   Outpatient Encounter Prescriptions as of 01/26/2017  Medication Sig  . allopurinol (ZYLOPRIM) 300 MG tablet Take 300 mg by mouth daily.  . budesonide-formoterol (SYMBICORT) 160-4.5 MCG/ACT inhaler Inhale 2 puffs into the lungs 2 (two) times daily.  . colchicine 0.6 MG tablet Take 0.6 mg by mouth daily as needed. For gout flare/pain  . Cyanocobalamin (B-12) 5000 MCG CAPS Take by mouth.  . furosemide (LASIX) 20 MG tablet Take 20 mg by mouth daily as needed.  . gabapentin (NEURONTIN) 300 MG capsule Take 600 mg by mouth 3 (three) times daily.   Marland Kitchen ibuprofen (ADVIL,MOTRIN) 800 MG tablet Take 800 mg by mouth every 8 (eight) hours as needed.  . isosorbide mononitrate (IMDUR) 30 MG 24 hr tablet TAKE 1 TABLET EVERY DAY  . levothyroxine (SYNTHROID, LEVOTHROID) 125 MCG tablet Take 1 tablet (125 mcg total) by mouth daily.  . metFORMIN (GLUCOPHAGE-XR) 500 MG 24 hr tablet Take 500 mg by mouth 2 (two) times daily.  . Multiple Vitamin (MULTIVITAMIN WITH MINERALS) TABS tablet Take 1 tablet by mouth  daily.  . nebivolol (BYSTOLIC) 5 MG tablet Take 5 mg by mouth daily.  . potassium chloride (K-DUR,KLOR-CON) 10 MEQ tablet Take 10 mEq by mouth 2 (two) times daily.  . Vitamin D, Ergocalciferol, (DRISDOL) 50000 units CAPS capsule Take 10,000 Units by mouth every 7 (seven) days.  Carlena Hurl 20 MG TABS tablet TAKE ONE TABLET BY MOUTH DAILY  . [DISCONTINUED] levothyroxine (SYNTHROID, LEVOTHROID) 100 MCG tablet Take 100 mcg by mouth daily.   No facility-administered encounter medications on file as of 01/26/2017.    ALLERGIES: Allergies  Allergen Reactions  . Nabumetone Hives    Relafen    VACCINATION STATUS: Immunization History  Administered Date(s) Administered  . Influenza Whole 01/14/2010  . Pneumococcal Polysaccharide-23 01/14/2010    HPI Frank Shelton is 56 y.o. male who presents today with a medical history as above. he is being seen in f/u for Long-standing history of hypothyroidism.  - He reports that he was diagnosed with hypothyroidism at approximate age of 38 years, treated with various doses of levothyroxine over the years currently on levothyroxine 100 g by mouth every morning.   He reports compliance to this medication, taking it always  in the morning on  empty  stomach with water. -  He also reports long-standing problem with goiter, for which she was sent for baseline thyroid ultrasound. See below.   he has been dealing with symptoms of  dysphagia, choking,  shortness of breath intermittently for several years. - He denies any significant voice change recently. -He denies family history of thyroid dysfunction, no family history of thyroid cancer. - He has history of type 2 diabetes diagnosed at approximate age of 48 years, complicated by coronary artery disease, stroke, obesity for most of his adult life. - His labs show A1c of 7.5%, he is on metformin 500 mg by mouth twice a day. - He complains of inability to lose weight, intermittent constipation. He denies  heat/cold intolerance.   Review of Systems  Constitutional: +  Overweight/obese most of his adult life, + fatigue, no subjective hyperthermia, no subjective hypothermia Eyes: no blurry vision, no xerophthalmia ENT: no sore throat, + nodules palpated in throat, + dysphagia, +odynophagia, no hoarseness Cardiovascular: no Chest Pain, no Shortness of Breath, no palpitations, no leg swelling Respiratory: + cough, no SOB Gastrointestinal: no Nausea/Vomiting/Diarhhea Musculoskeletal: no muscle/joint aches Skin: no rashes Neurological: no tremors, no numbness, no tingling, no dizziness Psychiatric: no depression, no anxiety  Objective:    BP 140/81   Pulse 72   Ht 6\' 4"  (1.93 m)   Wt (!) 391 lb (177.4 kg)   BMI 47.59 kg/m   Wt Readings from Last 3 Encounters:  01/26/17 (!) 391 lb (177.4 kg)  01/10/17 (!) 393 lb (178.3 kg)  12/12/16 (!) 395 lb 11.2 oz (179.5 kg)    Physical Exam  Constitutional: + obese, not in acute distress, normal state of mind Eyes: PERRLA, EOMI, no exophthalmos ENT: moist mucous membranes, + huge thyromegaly, no cervical lymphadenopathy Cardiovascular: normal precordial activity,  +  distant heart sounds  , no Murmur/Rubs/Gallops Respiratory:  adequate breathing efforts, no gross chest deformity, Clear to auscultation bilaterally Gastrointestinal: +abdomen is obese, soft, Non -tender, No distension, Bowel Sounds present Musculoskeletal: strength intact in all four extremities Skin: +dry skin, warm, no rashes, + skin signs of chronic venous insufficiency on bilateral lower extremities which are large and non- pitting. Pulses are 0 on dorsalis pedis and posterior tibial arteries on bilateral lower extremities.  Neurological: no tremor with outstretched hands, Deep tendon reflexes normal in all four extremities.  CMP ( most recent) CMP     Component Value Date/Time   NA 137 01/10/2017 1007   K 4.2 01/10/2017 1007   CL 105 01/10/2017 1007   CO2 23 01/10/2017  1007   GLUCOSE 191 (H) 01/10/2017 1007   BUN 13 01/10/2017 1007   CREATININE 0.84 01/10/2017 1007   CALCIUM 8.6 01/10/2017 1007   PROT 6.1 06/01/2010 0920   ALBUMIN 3.8 01/10/2017 1007   AST 21 06/01/2010 0920   ALT 24 06/01/2010 0920   ALKPHOS 61 06/01/2010 0920   BILITOT 0.5 06/01/2010 0920   GFRNONAA >90 01/10/2014 0443   GFRAA >90 01/10/2014 0443     Diabetic Labs (most recent): Lab Results  Component Value Date   HGBA1C 7.5 (H) 01/10/2017   HGBA1C 7.2 (H) 01/10/2014   HGBA1C 6.1 (H) 01/12/2012     Lipid Panel ( most recent) Lipid Panel     Component Value Date/Time   CHOL 91 01/10/2014 0443   TRIG 193 (H) 01/10/2014 0443   HDL 25 (L) 01/10/2014 0443   CHOLHDL 3.6 01/10/2014 0443   VLDL 39 01/10/2014 0443   LDLCALC 27 01/10/2014  1610      Assessment & Plan:   1. Hypothyroidism - Patient with long-standing hypothyroidism at least since age of 38 years.  - Based on his repeat thyroid function tests, he would benefit from a higher dose of levothyroxine. I discussed and initiated levothyroxine 125 g by mouth every morning.  - We discussed about correct intake of levothyroxine, at fasting, with water, separated by at least 30 minutes from breakfast, and separated by more than 4 hours from calcium, iron, multivitamins, acid reflux medications (PPIs). -Patient is made aware of the fact that thyroid hormone replacement is needed for life, dose to be adjusted by periodic monitoring of thyroid function tests.  2. Morbid obesity (HCC) -  Complicated by Type 2 diabetes since at least age 43, currently on metformin 500 mg by mouth twice a day. His A1c is 7.5%.  I advised him to continue metformin 500 mg by mouth twice a day.  Suggestion is made for him to avoid simple carbohydrates  from his diet including Cakes, Sweet Desserts, Ice Cream, Soda (diet and regular), Sweet Tea, Candies, Chips, Cookies, Store Bought Juices, Alcohol in Excess of  1-2 drinks a day, Artificial  Sweeteners, and "Sugar-free" Products. This will help patient to have stable blood glucose profile and potentially avoid unintended weight gain.  - We set a goal of 10% of his body weight loss which is approximately 40 pounds in 1-2 years time to impact his type 2 diabetes, hypertension, sleep apnea.  3. DM type 2 causing vascular disease (HCC) - See #2 above - His 25-hydroxy vitamin D is 54. He will not need any vitamin D supplements.  - He has requested for a prescription for diabetic shoes, which was given to him. He would benefit from a pair of diabetic shoes.  4. Goiter - Physical exam is significant for massive goiter changing the anatomy of his neck. -His  thyroid ultrasound is remarkable for massive multinodular goiter. 6-10 nodules ranging in size from subcentimeter up to 4.2 cm with suggestion of fine needle aspiration of multiple nodules.   - However, due to the sheer size of his goiter with potential compromise of his neck function ( he has sleep apnea on CPAP machine) , he will benefit from direct  thyroidectomy. He is already on full dose thyroid hormone.  - I discussed and referred him to Dr. Lovell Sheehan at [facility].   - I advised patient to maintain close follow up with his cardiologist and his PCP for primary care needs. Follow up plan: Return in about 3 months (around 04/27/2017) for follow up with labs after surgery.  Marquis Lunch, MD [facility] Phone: [phone]  Fax: [phone]   01/26/2017, 12:55 PM This note was partially dictated with voice recognition software. Similar sounding words can be transcribed inadequately or may not  be corrected upon review.

## 2017-01-31 ENCOUNTER — Other Ambulatory Visit: Payer: Self-pay

## 2017-01-31 MED ORDER — LEVOTHYROXINE SODIUM 125 MCG PO TABS: 125.0000 ug | ORAL_TABLET | Freq: Every day | ORAL | 1 refills | Status: DC

## 2017-02-02 ENCOUNTER — Encounter: Payer: Self-pay | Admitting: General Surgery

## 2017-02-02 ENCOUNTER — Encounter (HOSPITAL_COMMUNITY): Payer: Self-pay

## 2017-02-02 ENCOUNTER — Encounter (HOSPITAL_COMMUNITY)
Admission: RE | Admit: 2017-02-02 | Discharge: 2017-02-02 | Disposition: A | Discharge status: Home / Self Care | Payer: Medicare PPO | Source: Ambulatory Visit | Attending: General Surgery | Admitting: General Surgery

## 2017-02-02 ENCOUNTER — Telehealth: Payer: Self-pay | Admitting: Cardiovascular Disease

## 2017-02-02 ENCOUNTER — Ambulatory Visit (INDEPENDENT_AMBULATORY_CARE_PROVIDER_SITE_OTHER): Payer: Medicare PPO | Admitting: General Surgery

## 2017-02-02 VITALS — BP 140/76 | HR 83 | Temp 98.5°F | Resp 18 | Ht 76.0 in | Wt 390.0 lb

## 2017-02-02 DIAGNOSIS — E049 Nontoxic goiter, unspecified: Secondary | ICD-10-CM

## 2017-02-02 DIAGNOSIS — E042 Nontoxic multinodular goiter: Secondary | ICD-10-CM | POA: Diagnosis not present

## 2017-02-02 DIAGNOSIS — Z01812 Encounter for preprocedural laboratory examination: Secondary | ICD-10-CM | POA: Insufficient documentation

## 2017-02-02 HISTORY — DX: Acute myocardial infarction, unspecified: I21.9

## 2017-02-02 HISTORY — DX: Cerebral infarction, unspecified: I63.9

## 2017-02-02 HISTORY — DX: Hypothyroidism, unspecified: E03.9

## 2017-02-02 LAB — COMPREHENSIVE METABOLIC PANEL
ALBUMIN: 3.6 g/dL (ref 3.5–5.0)
ALT: 29 U/L (ref 17–63)
ANION GAP: 10 (ref 5–15)
AST: 26 U/L (ref 15–41)
Alkaline Phosphatase: 53 U/L (ref 38–126)
BILIRUBIN TOTAL: 0.5 mg/dL (ref 0.3–1.2)
BUN: 14 mg/dL (ref 6–20)
CALCIUM: 8.8 mg/dL — AB (ref 8.9–10.3)
CHLORIDE: 103 mmol/L (ref 101–111)
CO2: 24 mmol/L (ref 22–32)
Creatinine, Ser: 0.8 mg/dL (ref 0.61–1.24)
GFR calc Af Amer: >60 mL/min (ref >60)
GLUCOSE: 132 mg/dL — AB (ref 65–99)
POTASSIUM: 4 mmol/L (ref 3.5–5.1)
Sodium: 137 mmol/L (ref 135–145)
TOTAL PROTEIN: 6.9 g/dL (ref 6.5–8.1)

## 2017-02-02 LAB — CBC WITH DIFFERENTIAL/PLATELET
BASOS ABS: 0 10*3/uL (ref 0.0–0.1)
BASOS PCT: 0 %
EOS PCT: 2 %
Eosinophils Absolute: 0.2 10*3/uL (ref 0.0–0.7)
HEMATOCRIT: 40.1 % (ref 39.0–52.0)
Hemoglobin: 13.7 g/dL (ref 13.0–17.0)
Lymphocytes Relative: 35 %
Lymphs Abs: 2.6 10*3/uL (ref 0.7–4.0)
MCH: 32.2 pg (ref 26.0–34.0)
MCHC: 34.2 g/dL (ref 30.0–36.0)
MCV: 94.1 fL (ref 78.0–100.0)
MONO ABS: 0.6 10*3/uL (ref 0.1–1.0)
MONOS PCT: 8 %
NEUTROS ABS: 3.9 10*3/uL (ref 1.7–7.7)
Neutrophils Relative %: 55 %
PLATELETS: 186 10*3/uL (ref 150–400)
RBC: 4.26 MIL/uL (ref 4.22–5.81)
RDW: 13.7 % (ref 11.5–15.5)
WBC: 7.2 10*3/uL (ref 4.0–10.5)

## 2017-02-02 MED ORDER — CALCIUM CARBONATE-VITAMIN D 500-200 MG-UNIT PO TABS: 1.0000 | ORAL_TABLET | Freq: Two times a day (BID) | ORAL | 1 refills | Status: AC

## 2017-02-02 NOTE — Telephone Encounter (Signed)
Pt wanted to let Dr. Purvis Sheffield know that he's having surgery to have his Thyroid removed

## 2017-02-02 NOTE — Patient Instructions (Signed)
Frank Shelton  02/02/2017     @   Your procedure is scheduled on  02/10/2017 .  Report to [facility] at  900  A.M.  Call this number if you have problems the morning of surgery:  [phone]   Remember:  Do not eat food or drink liquids after midnight.  Take these medicines the morning of surgery with A SIP OF WATER  Alopurinol, neurontin, levothyroxine, bystoloic. Use your symbicort before you come. DO NOT take anything for diabetes the morning of your surgery.   Do not wear jewelry, make-up or nail polish.  Do not wear lotions, powders, or perfumes, or deoderant.  Do not shave 48 hours prior to surgery.  Men may shave face and neck.  Do not bring valuables to the hospital.  [facility] is not responsible for any belongings or valuables.  Contacts, dentures or bridgework may not be worn into surgery.  Leave your suitcase in the car.  After surgery it may be brought to your room.  For patients admitted to the hospital, discharge time will be determined by your treatment team.  Patients discharged the day of surgery will not be allowed to drive home.   Name and phone number of your driver:   family Special instructions:  None  Please read over the following fact sheets that you were given. Anesthesia Post-op Instructions and Care and Recovery After Surgery       Thyroidectomy A thyroidectomy is a surgery that is done to remove all (total thyroidectomy) or part (subtotal thyroidectomy) of your thyroid gland. The thyroid is a butterfly-shaped gland that is located at the lower front of your neck. It produces a substance that helps to control certain body processes (thyroid hormone). The amount of thyroid gland tissue that is removed during your thyroidectomy depends on the reason you need the procedure. You may have a thyroidectomy to treat conditions including:  Thyroid nodules.  Thyroid cancer.  Benign thyroid  tumors.  Goiter.  Overactive thyroid gland (hyperthyroidism).  There are different ways to do a thyroidectomy:  Conventional thyroidectomy (open thyroidectomy). This procedure is the most common. In this procedure, the thyroid gland is removed through one surgical cut (incision) in the neck.  Endoscopic thyroidectomy. This procedure is less invasive. In this procedure, there may be several smaller incisions in the neck, chest, or armpit. The surgeon uses a tiny camera and other assistive tools to remove the thyroid gland.  Tell a health care provider about:  Any allergies you have.  All medicines you are taking, including vitamins, herbs, eye drops, creams, and over-the-counter medicines.  Any problems you or family members have had with anesthetic medicines.  Any blood disorders you have.  Any surgeries you have had.  Any medical conditions you have. What are the risks? Generally, this is a safe procedure. However, problems can occur and include:  A decrease in parathyroid hormone levels (hypoparathyroidism). Your parathyroid glands are located behind your thyroid gland, and they maintain the calcium level in your body. If these glands are damaged during surgery, your calcium level will drop. This will make your nerves irritable and cause muscle spasms.  An increase in thyroid hormone.  Damage to the nerves of your voice box (larynx).  Bleeding.  Infection at the site of the incision or incisions.  Temporary breathing difficulties. This is a very rare complication. It usually goes away within weeks.  What happens before the  procedure?  Your health care provider will perform a physical exam and assess your voice for vocal changes.  Ask your health care provider about: ? Changing or stopping your regular medicines. This is especially important if you are taking diabetes medicines or blood thinners. ? Taking medicines such as aspirin and ibuprofen. These medicines can thin  your blood. Do not take these medicines before your procedure if your health care provider instructs you not to.  Follow instructions from your health care provider about eating or drinking restrictions. What happens during the procedure? You will be given a medicine that makes you go to sleep (general anesthetic). Depending on which type of thyroidectomy you have, this is what may happen during the procedure: Conventional Thyroidectomy  The surgeon will make an incision in the center of your lower neck.  The muscles in your neck will be separated to reveal your thyroid gland.  Part or all of your thyroid gland will be removed.  You may need a tube (catheter) at the incision site to drain blood and fluids that accumulate under the skin after the procedure.The catheter may have to stay in place for a day or two after the procedure.  The incision will be closed with stitches (sutures). Endoscopic Thyroidectomy  The surgeon will make several small incisions in your neck, chest, or armpit.  The surgeon will use a narrow tube with a light and camera at the end (endoscope). The surgeon will insert the endoscope into an incision.  Part or all of your thyroid gland will be removed.  You may need a catheter at the incision site to drain blood and fluids that accumulate under the skin after the procedure.The catheter may have to stay in place for a day or two after the procedure.  The incision will be closed with sutures. What happens after the procedure?  Your blood pressure, heart rate, breathing rate, and blood oxygen level will be monitored often until the medicines you were given have worn off.  Depending on the type of thyroidectomy you had, you may have: ? A swollen neck. ? Some mild neck pain. ? A slightly sore throat. ? A weak voice.  You will not be able to eat or drink until your health care provider says it is okay.  You may have a blood test to check the level of calcium  in your body.  If you had a catheter put in during the procedure, it will usually be removed the next day. This information is not intended to replace advice given to you by your health care provider. Make sure you discuss any questions you have with your health care provider. Document Released: 10/26/2000 Document Revised: 01/03/2016 Document Reviewed: 10/02/2013 Elsevier Interactive Patient Education  2018 [employer] Thyroidectomy, Care After Refer to this sheet in the next few weeks. These instructions provide you with information about caring for yourself after your procedure. Your health care provider may also give you more specific instructions. Your treatment has been planned according to current medical practices, but problems sometimes occur. Call your health care provider if you have any problems or questions after your procedure. What can I expect after the procedure? After your procedure, it is typical to have:  Mild pain in the neck or upper body, especially when swallowing.  A sore throat.  A weak voice.  Follow these instructions at home:  Take medicines only as directed by your health care provider.  If your entire thyroid gland was removed, you  may need to take thyroid hormone medicine from now on.  Do not take medicines that contain aspirin and ibuprofen until your health care provider says that you can. These medicines can increase your risk of bleeding.  Some pain medicines cause constipation. Drink enough fluid to keep your urine clear or pale yellow. This can help to prevent constipation.  Start slowly with eating. You may need to have only liquids and soft foods for a few days or as directed by your health care provider.  Do not take baths, swim, or use a hot tub until your health care provider approves.  There are many different ways to close and cover an incision, including stitches (sutures), skin glue, and adhesive strips. Follow your health care  provider's instructions for: ? Incision care. ? Bandage (dressing) changes and removal. ? Incision closure removal.  Resume your usual activities as directed by your health care provider.  For the first 10 days after the procedure or as instructed by your health care provider: ? Do not lift anything heavier than 20 lb (9.1 kg). ? Do not jog, swim, or do other strenuous exercises. ? Do not play contact sports.  Keep all follow-up visits as directed by your health care provider. This is important. Contact a health care provider if:  The soreness in your throat gets worse.  You have increased pain at your incision or incisions.  You have increased bleeding from an incision.  Your incision becomes infected. Watch for: ? Swelling. ? Redness. ? Warmth. ? Pus.  You notice a bad smell coming from an incision or dressing.  You have a fever.  You feel lightheaded or faint.  You have numbness, tingling, or muscle spasms in your: ? Arms. ? Hands. ? Feet. ? Face.  You have trouble swallowing. Get help right away if:  You develop a rash.  You have difficulty breathing.  You hear whistling noises coming from your chest.  You develop a cough that gets worse.  Your speech changes, or you have hoarseness that gets worse. This information is not intended to replace advice given to you by your health care provider. Make sure you discuss any questions you have with your health care provider. Document Released: 11/19/2004 Document Revised: 01/03/2016 Document Reviewed: 10/02/2013 Elsevier Interactive Patient Education  2018 [employer].  General Anesthesia, Adult General anesthesia is the use of medicines to make a person "go to sleep" (be unconscious) for a medical procedure. General anesthesia is often recommended when a procedure:  Is long.  Requires you to be still or in an unusual position.  Is major and can cause you to lose blood.  Is impossible to do without  general anesthesia.  The medicines used for general anesthesia are called general anesthetics. In addition to making you sleep, the medicines:  Prevent pain.  Control your blood pressure.  Relax your muscles.  Tell a health care provider about:  Any allergies you have.  All medicines you are taking, including vitamins, herbs, eye drops, creams, and over-the-counter medicines.  Any problems you or family members have had with anesthetic medicines.  Types of anesthetics you have had in the past.  Any bleeding disorders you have.  Any surgeries you have had.  Any medical conditions you have.  Any history of heart or lung conditions, such as heart failure, sleep apnea, or chronic obstructive pulmonary disease (COPD).  Whether you are pregnant or may be pregnant.  Whether you use tobacco, alcohol, marijuana, or street drugs.  Any history of Financial planner.  Any history of depression or anxiety. What are the risks? Generally, this is a safe procedure. However, problems may occur, including:  Allergic reaction to anesthetics.  Lung and heart problems.  Inhaling food or liquids from your stomach into your lungs (aspiration).  Injury to nerves.  Waking up during your procedure and being unable to move (rare).  Extreme agitation or a state of mental confusion (delirium) when you wake up from the anesthetic.  Air in the bloodstream, which can lead to stroke.  These problems are more likely to develop if you are having a major surgery or if you have an advanced medical condition. You can prevent some of these complications by answering all of your health care provider's questions thoroughly and by following all pre-procedure instructions. General anesthesia can cause side effects, including:  Nausea or vomiting  A sore throat from the breathing tube.  Feeling cold or shivery.  Feeling tired, washed out, or achy.  Sleepiness or drowsiness.  Confusion or  agitation.  What happens before the procedure? Staying hydrated Follow instructions from your health care provider about hydration, which may include:  Up to 2 hours before the procedure - you may continue to drink clear liquids, such as water, clear fruit juice, black coffee, and plain tea.  Eating and drinking restrictions Follow instructions from your health care provider about eating and drinking, which may include:  8 hours before the procedure - stop eating heavy meals or foods such as meat, fried foods, or fatty foods.  6 hours before the procedure - stop eating light meals or foods, such as toast or cereal.  6 hours before the procedure - stop drinking milk or drinks that contain milk.  2 hours before the procedure - stop drinking clear liquids.  Medicines  Ask your health care provider about: ? Changing or stopping your regular medicines. This is especially important if you are taking diabetes medicines or blood thinners. ? Taking medicines such as aspirin and ibuprofen. These medicines can thin your blood. Do not take these medicines before your procedure if your health care provider instructs you not to. ? Taking new dietary supplements or medicines. Do not take these during the week before your procedure unless your health care provider approves them.  If you are told to take a medicine or to continue taking a medicine on the day of the procedure, take the medicine with sips of water. General instructions   Ask if you will be going home the same day, the following day, or after a longer hospital stay. ? Plan to have someone take you home. ? Plan to have someone stay with you for the first 24 hours after you leave the hospital or clinic.  For 3-6 weeks before the procedure, try not to use any tobacco products, such as cigarettes, chewing tobacco, and e-cigarettes.  You may brush your teeth on the morning of the procedure, but make sure to spit out the toothpaste. What  happens during the procedure?  You will be given anesthetics through a mask and through an IV tube in one of your veins.  You may receive medicine to help you relax (sedative).  As soon as you are asleep, a breathing tube may be used to help you breathe.  An anesthesia specialist will stay with you throughout the procedure. He or she will help keep you comfortable and safe by continuing to give you medicines and adjusting the amount of medicine that you  get. He or she will also watch your blood pressure, pulse, and oxygen levels to make sure that the anesthetics do not cause any problems.  If a breathing tube was used to help you breathe, it will be removed before you wake up. The procedure may vary among health care providers and hospitals. What happens after the procedure?  You will wake up, often slowly, after the procedure is complete, usually in a recovery area.  Your blood pressure, heart rate, breathing rate, and blood oxygen level will be monitored until the medicines you were given have worn off.  You may be given medicine to help you calm down if you feel anxious or agitated.  If you will be going home the same day, your health care provider may check to make sure you can stand, drink, and urinate.  Your health care providers will treat your pain and side effects before you go home.  Do not drive for 24 hours if you received a sedative.  You may: ? Feel nauseous and vomit. ? Have a sore throat. ? Have mental slowness. ? Feel cold or shivery. ? Feel sleepy. ? Feel tired. ? Feel sore or achy, even in parts of your body where you did not have surgery. This information is not intended to replace advice given to you by your health care provider. Make sure you discuss any questions you have with your health care provider. Document Released: 08/09/2007 Document Revised: 10/13/2015 Document Reviewed: 04/16/2015 Elsevier Interactive Patient Education  2018 [employer]. General  Anesthesia, Adult, Care After These instructions provide you with information about caring for yourself after your procedure. Your health care provider may also give you more specific instructions. Your treatment has been planned according to current medical practices, but problems sometimes occur. Call your health care provider if you have any problems or questions after your procedure. What can I expect after the procedure? After the procedure, it is common to have:  Vomiting.  A sore throat.  Mental slowness.  It is common to feel:  Nauseous.  Cold or shivery.  Sleepy.  Tired.  Sore or achy, even in parts of your body where you did not have surgery.  Follow these instructions at home: For at least 24 hours after the procedure:  Do not: ? Participate in activities where you could fall or become injured. ? Drive. ? Use heavy machinery. ? Drink alcohol. ? Take sleeping pills or medicines that cause drowsiness. ? Make important decisions or sign legal documents. ? Take care of children on your own.  Rest. Eating and drinking  If you vomit, drink water, juice, or soup when you can drink without vomiting.  Drink enough fluid to keep your urine clear or pale yellow.  Make sure you have little or no nausea before eating solid foods.  Follow the diet recommended by your health care provider. General instructions  Have a responsible adult stay with you until you are awake and alert.  Return to your normal activities as told by your health care provider. Ask your health care provider what activities are safe for you.  Take over-the-counter and prescription medicines only as told by your health care provider.  If you smoke, do not smoke without supervision.  Keep all follow-up visits as told by your health care provider. This is important. Contact a health care provider if:  You continue to have nausea or vomiting at home, and medicines are not helpful.  You cannot  drink fluids or start  eating again.  You cannot urinate after 8-12 hours.  You develop a skin rash.  You have fever.  You have increasing redness at the site of your procedure. Get help right away if:  You have difficulty breathing.  You have chest pain.  You have unexpected bleeding.  You feel that you are having a life-threatening or urgent problem. This information is not intended to replace advice given to you by your health care provider. Make sure you discuss any questions you have with your health care provider. Document Released: 08/08/2000 Document Revised: 10/05/2015 Document Reviewed: 04/16/2015 Elsevier Interactive Patient Education  [employer].

## 2017-02-02 NOTE — Telephone Encounter (Signed)
Will forward to Dr. Abigail Butts.

## 2017-02-02 NOTE — Progress Notes (Signed)
Frank Shelton; [id number]; [birth year]   HPI Patient is a 56 year old white male who is referred to my care by Dr. Fransico Him for evaluation and treatment of a multinodular goiter. It is been present for some time now. He has been on suppressive therapy without adequate results. He has noted recently enlargement of the thyroid with difficulty sleeping on his back. He does have sleep apnea. He has noted some dysphagia. He denies any family history of thyroid cancer, neck irradiation, heat intolerance, or heart palpitations. He does have some pain, but it is not particularly localized to his neck. He has had no voice changes. 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 Past Medical History:  Diagnosis Date  . Diabetes mellitus without complication (HCC)   . Embolism - blood clot   . Gout   . Hypertension   . Hypothyroidism   . Myocardial infarction (HCC) 2012  . Pulmonary embolism (HCC)   . Sleep apnea   . Stroke [facility]) 2014    Past Surgical History:  Procedure Laterality Date  . HELLER MYOTOMY    . IVC FILTER INSERTION    . ROTATOR CUFF REPAIR Right   . SKIN GRAFT     x3- to face.    Family History  Problem Relation Age of Onset  . Heart attack Sister 30       Two stents  . Heart attack Mother   . Heart attack Maternal Grandmother   . Heart attack Maternal Grandfather     Current Outpatient Prescriptions on File Prior to Visit  Medication Sig Dispense Refill  . allopurinol (ZYLOPRIM) 300 MG tablet Take 300 mg by mouth daily.    . budesonide-formoterol (SYMBICORT) 160-4.5 MCG/ACT inhaler Inhale 2 puffs into the lungs 2 (two) times daily.    . colchicine 0.6 MG tablet Take 0.6 mg by mouth daily as needed. For gout flare/pain    . Cyanocobalamin (B-12) 5000 MCG CAPS Take by mouth.    . furosemide (LASIX) 20 MG tablet Take 20 mg by mouth daily as needed.    . gabapentin (NEURONTIN) 300 MG capsule Take 600 mg by mouth 3 (three) times daily.     Marland Kitchen  ibuprofen (ADVIL,MOTRIN) 800 MG tablet Take 800 mg by mouth every 8 (eight) hours as needed.    . isosorbide mononitrate (IMDUR) 30 MG 24 hr tablet TAKE 1 TABLET EVERY DAY 90 tablet 3  . levothyroxine (SYNTHROID, LEVOTHROID) 125 MCG tablet Take 1 tablet (125 mcg total) by mouth daily. 90 tablet 1  . metFORMIN (GLUCOPHAGE-XR) 500 MG 24 hr tablet Take 500 mg by mouth 2 (two) times daily.    . Multiple Vitamin (MULTIVITAMIN WITH MINERALS) TABS tablet Take 1 tablet by mouth daily.    . nebivolol (BYSTOLIC) 5 MG tablet Take 5 mg by mouth daily.    . potassium chloride (K-DUR,KLOR-CON) 10 MEQ tablet Take 10 mEq by mouth 2 (two) times daily.    Marland Kitchen VITAMIN D, ERGOCALCIFEROL, PO Take 10,000 Units by mouth daily.     Carlena Hurl 20 MG TABS tablet TAKE ONE TABLET BY MOUTH DAILY 30 tablet 3   No current facility-administered medications on file prior to visit.     Allergies  Allergen Reactions  . Nabumetone Hives    Relafen    History  Alcohol Use  . 0.0 oz/week    Comment: occasional    History  Smoking Status  . Former Smoker  . Packs/day: 1.00  .  Years: 30.00  . Start date: 01/30/1970  . Quit date: 01/30/2006  Smokeless Tobacco  . Former User    Comment: 9/17 On and off since age 45- AJ    Review of Systems  Constitutional: Negative.   HENT: Negative.   Eyes: Negative.   Respiratory: Positive for cough and shortness of breath.   Cardiovascular: Negative.   Gastrointestinal: Negative.   Genitourinary: Negative.   Musculoskeletal: Positive for joint pain.  Skin: Negative.   Neurological: Negative.   Endo/Heme/Allergies: Bruises/bleeds easily.  Psychiatric/Behavioral: Negative.     Objective   Vitals:   02/02/17 0849  BP: 140/76  Pulse: 83  Resp: 18  Temp: 98.5 F (36.9 C)    Physical Exam  Constitutional: He is oriented to person, place, and time and well-developed, well-nourished, and in no distress.  HENT:  Head: Normocephalic and atraumatic.  Neck: Neck supple.  No tracheal deviation present. Thyromegaly present.  Enlarged thyroid gland in both lobes. Difficult to discern specific dominant nodules. No lymphadenopathy is noted. No tracheal deviation noted.  Cardiovascular: Normal rate, regular rhythm and normal heart sounds.  Exam reveals no gallop and no friction rub.   No murmur heard. Pulmonary/Chest: Effort normal and breath sounds normal. No respiratory distress. He has no wheezes. He has no rales.  Lymphadenopathy:    He has no cervical adenopathy.  Neurological: He is alert and oriented to person, place, and time.  Skin: Skin is warm and dry.  Vitals reviewed.  Dr. Isidoro Donning note reviewed.  U/S report reviewed. Assessment  Multinodular goiter with compressive symptoms present. Plan   Patient is scheduled for a total thyroidectomy on 02/10/2017. The risks and benefits of the procedure including bleeding, infection, voice changes, and the possibility of malignancy were fully explained to the patient, who gave informed consent. He was instructed to stop his Xeralto 2 days prior to the procedure.  Addendum: While at his preoperative interview, anesthesia at [facility] found that he was listed as a difficult intubation by [facility] Thus, they do not feel comfortable in proceeding with surgery here at [facility].  Will refer patient to Dr. Gerrit Friends in [place] for further evaluation and treatment.

## 2017-02-02 NOTE — Patient Instructions (Signed)
Thyroidectomy A thyroidectomy is a surgery that is done to remove all (total thyroidectomy) or part (subtotal thyroidectomy) of your thyroid gland. The thyroid is a butterfly-shaped gland that is located at the lower front of your neck. It produces a substance that helps to control certain body processes (thyroid hormone). The amount of thyroid gland tissue that is removed during your thyroidectomy depends on the reason you need the procedure. You may have a thyroidectomy to treat conditions including:  Thyroid nodules.  Thyroid cancer.  Benign thyroid tumors.  Goiter.  Overactive thyroid gland (hyperthyroidism).  There are different ways to do a thyroidectomy:  Conventional thyroidectomy (open thyroidectomy). This procedure is the most common. In this procedure, the thyroid gland is removed through one surgical cut (incision) in the neck.  Endoscopic thyroidectomy. This procedure is less invasive. In this procedure, there may be several smaller incisions in the neck, chest, or armpit. The surgeon uses a tiny camera and other assistive tools to remove the thyroid gland.  Tell a health care provider about:  Any allergies you have.  All medicines you are taking, including vitamins, herbs, eye drops, creams, and over-the-counter medicines.  Any problems you or family members have had with anesthetic medicines.  Any blood disorders you have.  Any surgeries you have had.  Any medical conditions you have. What are the risks? Generally, this is a safe procedure. However, problems can occur and include:  A decrease in parathyroid hormone levels (hypoparathyroidism). Your parathyroid glands are located behind your thyroid gland, and they maintain the calcium level in your body. If these glands are damaged during surgery, your calcium level will drop. This will make your nerves irritable and cause muscle spasms.  An increase in thyroid hormone.  Damage to the nerves of your voice box  (larynx).  Bleeding.  Infection at the site of the incision or incisions.  Temporary breathing difficulties. This is a very rare complication. It usually goes away within weeks.  What happens before the procedure?  Your health care provider will perform a physical exam and assess your voice for vocal changes.  Ask your health care provider about: ? Changing or stopping your regular medicines. This is especially important if you are taking diabetes medicines or blood thinners. ? Taking medicines such as aspirin and ibuprofen. These medicines can thin your blood. Do not take these medicines before your procedure if your health care provider instructs you not to.  Follow instructions from your health care provider about eating or drinking restrictions. What happens during the procedure? You will be given a medicine that makes you go to sleep (general anesthetic). Depending on which type of thyroidectomy you have, this is what may happen during the procedure: Conventional Thyroidectomy  The surgeon will make an incision in the center of your lower neck.  The muscles in your neck will be separated to reveal your thyroid gland.  Part or all of your thyroid gland will be removed.  You may need a tube (catheter) at the incision site to drain blood and fluids that accumulate under the skin after the procedure.The catheter may have to stay in place for a day or two after the procedure.  The incision will be closed with stitches (sutures). Endoscopic Thyroidectomy  The surgeon will make several small incisions in your neck, chest, or armpit.  The surgeon will use a narrow tube with a light and camera at the end (endoscope). The surgeon will insert the endoscope into an incision.  Part or   all of your thyroid gland will be removed.  You may need a catheter at the incision site to drain blood and fluids that accumulate under the skin after the procedure.The catheter may have to stay in  place for a day or two after the procedure.  The incision will be closed with sutures. What happens after the procedure?  Your blood pressure, heart rate, breathing rate, and blood oxygen level will be monitored often until the medicines you were given have worn off.  Depending on the type of thyroidectomy you had, you may have: ? A swollen neck. ? Some mild neck pain. ? A slightly sore throat. ? A weak voice.  You will not be able to eat or drink until your health care provider says it is okay.  You may have a blood test to check the level of calcium in your body.  If you had a catheter put in during the procedure, it will usually be removed the next day. This information is not intended to replace advice given to you by your health care provider. Make sure you discuss any questions you have with your health care provider. Document Released: 10/26/2000 Document Revised: 01/03/2016 Document Reviewed: 10/02/2013 Elsevier Interactive Patient Education  2018 Elsevier Inc.  

## 2017-02-10 ENCOUNTER — Ambulatory Visit (HOSPITAL_COMMUNITY): Admission: RE | Admit: 2017-02-10 | Payer: Medicare PPO | Source: Ambulatory Visit | Admitting: General Surgery

## 2017-02-10 ENCOUNTER — Encounter (HOSPITAL_COMMUNITY): Admission: RE | Payer: Self-pay | Source: Ambulatory Visit

## 2017-02-10 SURGERY — THYROIDECTOMY
Anesthesia: General

## 2017-02-20 ENCOUNTER — Other Ambulatory Visit: Payer: Self-pay | Admitting: "Endocrinology

## 2017-02-20 ENCOUNTER — Telehealth: Payer: Self-pay

## 2017-02-20 MED ORDER — LEVOTHYROXINE SODIUM 150 MCG PO TABS
150.0000 ug | ORAL_TABLET | Freq: Every day | ORAL | 2 refills | Status: DC
Start: 2017-02-20 — End: 2017-05-04

## 2017-02-20 NOTE — Telephone Encounter (Signed)
Pt states that when he started seeing Dr Fransico Him he told us that he was taking Levothyroxine . He has realized that he has been taking the whole time. At last office visit was sent in.What dosage should pt be on since he has been taking the whole time.

## 2017-02-22 ENCOUNTER — Ambulatory Visit: Payer: Self-pay | Admitting: Surgery

## 2017-02-22 NOTE — H&P (Signed)
General Surgery [facility]  JUDSON TSAN 02/22/2017 9:44 AM Location: [facility] Patient #: [id number] DOB: Dec 09, 1960 Single / Language: [place] / Race: White Male   History of Present Illness Velora Heckler MD; 02/22/2017 10:18 AM) The patient is a 56 year old male who presents with a complaint of Enlarged thyroid.  CC: thyroid goiter with compressive symptoms  Patient is referred by Dr. [name] for evaluation and management of multinodular thyroid goiter with compressive symptoms. Patient's endocrinologist is Dr. Maryella Shivers. Patient has had a long-standing history of thyroid disease. He has been on thyroid medication for at least 15 years. He has had gradual enlargement of his thyroid gland. Recent ultrasound shows multiple thyroid nodules of which some are mildly suspicious and biopsy was recommended by the radiologist. Patient has mild compressive symptoms with inability to sleep recumbent, sleep apnea, and dysphagia for both solids and liquids. He has been evaluated by his endocrinologist. They discussed percutaneous biopsy of thyroid nodules versus proceeding with thyroidectomy. After evaluation, the patient had decided to proceed with total thyroidectomy and avoid preoperative biopsies. Patient was seen and evaluated by Dr. [name]. However, the patient has a history of a difficult airway at the time of the procedure for rotator cuff repair at [facility] 14 years ago. Dr. Lovell Sheehan did not think it was appropriate for this patient to be taken care of in [place]. Therefore the patient was referred to [facility] for surgical evaluation and anesthesia evaluation preoperatively with surgery to be done here. Patient has no significant family history of endocrine disease. There is no history of endocrine malignancy. Patient does have a significant past medical history of deep venous thrombosis. He has also had  cardiac disease and a mild stroke. He is on chronic anticoagulation. For procedures, he stops the anticoagulations 3 days prior to his procedure. Patient presents today to discuss options for management of his multinodular thyroid goiter.   Past Surgical History Michel Bickers, LPN; 04/54/0981 10:13 AM) Shoulder Surgery  Right.  Diagnostic Studies History Michel Bickers, LPN; 19/14/7829 10:13 AM) Colonoscopy  1-5 years ago  Medication History Judithann Sauger, [place]; 02/22/2017 9:52 AM) Levothyroxine Sodium ( Tablet, Oral) Active. B Complex-B12 (Oral) Active. IBU (  Tablet, Oral) Active. Vitamin D (Ergocalciferol) (50000UNIT Capsule, BID Oral) Active. Synthroid ( Tablet, Oral) Active. Isosorbide (100GM/220ML Solution, Oral) Active. Xarelto (  Tablet, Oral) Active. Budesonide (Inhalation) (200MCG/INH Statistician, Inhalation) Active. Lasix (  Tablet, Oral) Active. Multiple Vitamin (Oral) Active. Bystolic (  Tablet, Oral) Active. Allopurinol (  Tablet, Oral) Active. Colchicine (0.6MG  Tablet, Oral) Active. Gabapentin (  Tablet, Oral) Active. MetFORMIN HCl (  Tablet, Oral) Active. Medications Reconciled  Social History Michel Bickers, LPN; 56/21/3086 10:13 AM) Alcohol use  Occasional alcohol use. No caffeine use  Tobacco use  Former smoker.  Family History Michel Bickers, LPN; 57/84/6962 10:13 AM) Alcohol Abuse  Father, Son. Cerebrovascular Accident  Mother. Diabetes Mellitus  Father, Sister. Heart Disease  Mother. Heart disease in male family member before age 63  Heart disease in male family member before age 76  Hypertension  Mother. Migraine Headache  Mother.  Other Problems Michel Bickers, LPN; 95/28/4132 10:13 AM) Alcohol Abuse  Anxiety Disorder  Chest pain  Congestive Heart Failure  Depression  Diabetes Mellitus  Gastroesophageal Reflux Disease  High blood pressure  Myocardial infarction   Pulmonary Embolism / Blood Clot in Legs  Sleep Apnea  Thyroid Disease     Review of Systems Tresa Endo [facility] LPN; 44/05/270 10:13  AM) General Not Present- Appetite Loss, Chills, Fatigue, Fever, Night Sweats, Weight Gain and Weight Loss. Skin Present- Dryness, New Lesions and Non-Healing Wounds. Not Present- Change in Wart/Mole, Hives, Jaundice, Rash and Ulcer. HEENT Present- Seasonal Allergies and Wears glasses/contact lenses. Not Present- Earache, Hearing Loss, Hoarseness, Nose Bleed, Oral Ulcers, Ringing in the Ears, Sinus Pain, Sore Throat, Visual Disturbances and Yellow Eyes. Respiratory Present- Difficulty Breathing and Snoring. Not Present- Bloody sputum, Chronic Cough and Wheezing. Breast Not Present- Breast Mass, Breast Pain, Nipple Discharge and Skin Changes. Cardiovascular Present- Difficulty Breathing Lying Down, Leg Cramps, Shortness of Breath and Swelling of Extremities. Not Present- Chest Pain, Palpitations and Rapid Heart Rate. Gastrointestinal Present- Constipation, Difficulty Swallowing, Excessive gas and Gets full quickly at meals. Not Present- Abdominal Pain, Bloating, Bloody Stool, Change in Bowel Habits, Chronic diarrhea, Hemorrhoids, Indigestion, Nausea, Rectal Pain and Vomiting. Male Genitourinary Present- Frequency, Impotence, Nocturia and Urgency. Not Present- Blood in Urine, Change in Urinary Stream, Painful Urination and Urine Leakage.  Vitals Judithann Sauger RMA; 02/22/2017 9:46 AM) 02/22/2017 9:45 AM Weight: 390.2 lb Height: 76in Body Surface Area: 2.94 m Body Mass Index: 47.5 kg/m  Temp.: 98.30F  Pulse: 88 (Regular)  BP: 135/72 (Sitting, Left Arm, Standard)       Physical Exam Velora Heckler MD; 02/22/2017 10:19 AM) The physical exam findings are as follows: Note:CONSTITUTIONAL See vital signs recorded above  GENERAL APPEARANCE Development: normal Nutritional status: normal Gross deformities: none  SKIN Rash, lesions, ulcers:  none Induration, erythema: none Nodules: none palpable  EYES Conjunctiva and lids: normal Pupils: equal and reactive Iris: normal bilaterally  EARS, NOSE, MOUTH, THROAT External ears: no lesion or deformity External nose: no lesion or deformity Hearing: grossly normal Lips: no lesion or deformity Dentition: normal for age Oral mucosa: moist  NECK Symmetric: yes Trachea: midline Thyroid: Enlarged thyroid gland situated relatively high and anterior in the neck, firm, multinodular, nontender; mobile with swallowing  CHEST Respiratory effort: normal Retraction or accessory muscle use: no Breath sounds: normal bilaterally Rales, rhonchi, wheeze: none  CARDIOVASCULAR Auscultation: regular rhythm, normal rate Murmurs: none Pulses: carotid and radial pulse 2+ palpable Lower extremity edema: marked bilateral  MUSCULOSKELETAL Station and gait: normal Digits and nails: no clubbing or cyanosis Muscle strength: grossly normal all extremities Range of motion: grossly normal all extremities Deformity: none  LYMPHATIC Cervical: none palpable Supraclavicular: none palpable  PSYCHIATRIC Oriented to person, place, and time: yes Mood and affect: normal for situation Judgment and insight: appropriate for situation    Assessment & Plan Velora Heckler MD; 02/22/2017 10:22 AM) THYROID GOITER (E04.9) MULTIPLE THYROID NODULES (E04.2) Current Plans Pt Education - Pamphlet Given - The Thyroid Book: discussed with patient and provided information. Patient presents on referral from his general surgeon and his endocrinologist in [place]. Patient is provided with written literature on thyroid surgery to review at home.  Patient has an enlarged multinodular thyroid goiter. On ultrasound, some of the nodules are mildly suspicious and biopsy has been recommended but not performed. Patient does have some mild compressive symptoms including dysphagia, sleep apnea, and shortness of  breath. Patient is on thyroid medication and taking Synthroid 150 g daily.  We discussed options for management. Either the patient needs to undergo percutaneous needle biopsy of his suspicious thyroid nodules with plan of continued observation if these are benign, or the patient needs to consider total thyroidectomy for definitive diagnosis and for management of his compressive symptoms. Patient has been recommended to have surgery by his endocrinologist  and his Careers adviser. He now presents to discuss total thyroidectomy. We discussed the procedure in detail. We discussed the risk and benefits including the risk of injury to recurrent laryngeal nerves and parathyroid glands. We discussed the location and size of the surgical incision. We discussed the fact that the patient's overall risk for surgery was increased due to his history of anticoagulation, deep venous thrombosis, cardiac disease, and stroke. Patient will need to see anesthesiology preoperatively for evaluation of a potentially difficult airway. We would plan an overnight stay following surgery for observation. Patient will of course be on lifelong thyroid hormone replacement following surgery. Patient understands all these issues and wishes to proceed with surgery in the near future.  The risks and benefits of the procedure have been discussed at length with the patient. The patient understands the proposed procedure, potential alternative treatments, and the course of recovery to be expected. All of the patient's questions have been answered at this time. The patient wishes to proceed with surgery.  Darnell Level, MD [facility] Surgery Office: [phone]

## 2017-03-06 NOTE — Pre-Procedure Instructions (Signed)
HURBERT DURAN  03/06/2017      [facility] - [place], [place] - [address] [place] [address] Phone: [phone] Fax: [phone]  [facility] [address] [place] - [place] [address] [place] [address] Phone: [phone] Fax: [phone]    Your procedure is scheduled on October 25  Report to [facility] Admitting at [employer] A.M.  Call this number if you have problems the morning of surgery:  [phone]   Remember:  Do not eat food or drink liquids after midnight.  Continue all other medications as directed by your physician except follow these medication instructions before surgery    Take these medicines the morning of surgery with A SIP OF WATER  acetaminophen (TYLENOL)  budesonide-formoterol (SYMBICORT) cetirizine (ZYRTEC) gabapentin (NEURONTIN) isosorbide mononitrate (IMDUR) levothyroxine (SYNTHROID, LEVOTHROID)  nebivolol (BYSTOLIC)  potassium chloride (K-DUR,KLOR-CON)  7 days prior to surgery STOP taking any Aspirin (unless otherwise instructed by your surgeon), Aleve, Naproxen, Ibuprofen, Motrin, Advil, Goody's, BC's, all herbal medications, fish oil, and all vitamins  FOLLOW PHYSICIANS INSTRUCTIONS ABOUT XARELTO  WHAT DO I DO ABOUT MY DIABETES MEDICATION?   Marland Kitchen Do not take oral diabetes medicines (pills) the morning of surgery. metFORMIN (GLUCOPHAGE-XR)   How to Manage Your Diabetes Before and After Surgery  Why is it important to control my blood sugar before and after surgery? . Improving blood sugar levels before and after surgery helps healing and can limit problems. . A way of improving blood sugar control is eating a healthy diet by: o  Eating less sugar and carbohydrates o  Increasing activity/exercise o  Talking with your doctor about reaching your blood sugar goals . High blood sugars (greater than 180 mg/dL) can raise your risk of infections and slow your  recovery, so you will need to focus on controlling your diabetes during the weeks before surgery. . Make sure that the doctor who takes care of your diabetes knows about your planned surgery including the date and location.  How do I manage my blood sugar before surgery? . Check your blood sugar at least 4 times a day, starting 2 days before surgery, to make sure that the level is not too high or low. o Check your blood sugar the morning of your surgery when you wake up and every 2 hours until you get to the Short Stay unit. . If your blood sugar is less than 70 mg/dL, you will need to treat for low blood sugar: o Do not take insulin. o Treat a low blood sugar (less than 70 mg/dL) with  cup of clear juice (cranberry or apple), 4 glucose tablets, OR glucose gel. o Recheck blood sugar in 15 minutes after treatment (to make sure it is greater than 70 mg/dL). If your blood sugar is not greater than 70 mg/dL on recheck, call [phone] for further instructions. . Report your blood sugar to the short stay nurse when you get to Short Stay.  . If you are admitted to the hospital after surgery: o Your blood sugar will be checked by the staff and you will probably be given insulin after surgery (instead of oral diabetes medicines) to make sure you have good blood sugar levels. o The goal for blood sugar control after surgery is 80-180 mg/dL.    Do not wear jewelry, make-up or nail polish.  Do not wear lotions, powders, or perfumes, or deoderant.  Do not  shave 48 hours prior to surgery.  Men may shave face and neck.  Do not bring valuables to the hospital.  [facility] is not responsible for any belongings or valuables.  Contacts, dentures or bridgework may not be worn into surgery.  Leave your suitcase in the car.  After surgery it may be brought to your room.  For patients admitted to the hospital, discharge time will be determined by your treatment team.  Patients discharged the day of surgery  will not be allowed to drive home.    Special instructions:   [place]- Preparing For Surgery  Before surgery, you can play an important role. Because skin is not sterile, your skin needs to be as free of germs as possible. You can reduce the number of germs on your skin by washing with CHG (chlorahexidine gluconate) Soap before surgery.  CHG is an antiseptic cleaner which kills germs and bonds with the skin to continue killing germs even after washing.  Please do not use if you have an allergy to CHG or antibacterial soaps. If your skin becomes reddened/irritated stop using the CHG.  Do not shave (including legs and underarms) for at least 48 hours prior to first CHG shower. It is OK to shave your face.  Please follow these instructions carefully.   1. Shower the NIGHT BEFORE SURGERY and the MORNING OF SURGERY with CHG.   2. If you chose to wash your hair, wash your hair first as usual with your normal shampoo.  3. After you shampoo, rinse your hair and body thoroughly to remove the shampoo.  4. Use CHG as you would any other liquid soap. You can apply CHG directly to the skin and wash gently with a scrungie or a clean washcloth.   5. Apply the CHG Soap to your body ONLY FROM THE NECK DOWN.  Do not use on open wounds or open sores. Avoid contact with your eyes, ears, mouth and genitals (private parts). Wash Face and genitals (private parts)  with your normal soap.  6. Wash thoroughly, paying special attention to the area where your surgery will be performed.  7. Thoroughly rinse your body with warm water from the neck down.  8. DO NOT shower/wash with your normal soap after using and rinsing off the CHG Soap.  9. Pat yourself dry with a CLEAN TOWEL.  10. Wear CLEAN PAJAMAS to bed the night before surgery, wear comfortable clothes the morning of surgery  11. Place CLEAN SHEETS on your bed the night of your first shower and DO NOT SLEEP WITH PETS.    Day of Surgery: Do not  apply any deodorants/lotions. Please wear clean clothes to the hospital/surgery center.      Please read over the following fact sheets that you were given.

## 2017-03-07 ENCOUNTER — Encounter (HOSPITAL_COMMUNITY)
Admission: RE | Admit: 2017-03-07 | Discharge: 2017-03-07 | Disposition: A | Discharge status: Home / Self Care | Payer: Medicare PPO | Source: Ambulatory Visit | Attending: Surgery | Admitting: Surgery

## 2017-03-07 ENCOUNTER — Encounter (HOSPITAL_COMMUNITY)
Admission: RE | Admit: 2017-03-07 | Discharge: 2017-03-07 | Disposition: A | Discharge status: Home / Self Care | Payer: Medicare PPO | Source: Ambulatory Visit | Attending: Anesthesiology | Admitting: Anesthesiology

## 2017-03-07 ENCOUNTER — Encounter (HOSPITAL_COMMUNITY): Payer: Self-pay

## 2017-03-07 DIAGNOSIS — Z01818 Encounter for other preprocedural examination: Secondary | ICD-10-CM

## 2017-03-07 DIAGNOSIS — Z7901 Long term (current) use of anticoagulants: Secondary | ICD-10-CM | POA: Diagnosis not present

## 2017-03-07 DIAGNOSIS — E042 Nontoxic multinodular goiter: Secondary | ICD-10-CM | POA: Diagnosis present

## 2017-03-07 DIAGNOSIS — Z23 Encounter for immunization: Secondary | ICD-10-CM | POA: Diagnosis not present

## 2017-03-07 DIAGNOSIS — Z7984 Long term (current) use of oral hypoglycemic drugs: Secondary | ICD-10-CM | POA: Diagnosis not present

## 2017-03-07 DIAGNOSIS — Z87891 Personal history of nicotine dependence: Secondary | ICD-10-CM | POA: Diagnosis not present

## 2017-03-07 DIAGNOSIS — E119 Type 2 diabetes mellitus without complications: Secondary | ICD-10-CM | POA: Diagnosis not present

## 2017-03-07 DIAGNOSIS — J449 Chronic obstructive pulmonary disease, unspecified: Secondary | ICD-10-CM | POA: Diagnosis not present

## 2017-03-07 DIAGNOSIS — K219 Gastro-esophageal reflux disease without esophagitis: Secondary | ICD-10-CM | POA: Diagnosis not present

## 2017-03-07 DIAGNOSIS — Z79899 Other long term (current) drug therapy: Secondary | ICD-10-CM | POA: Diagnosis not present

## 2017-03-07 DIAGNOSIS — I10 Essential (primary) hypertension: Secondary | ICD-10-CM | POA: Diagnosis not present

## 2017-03-07 HISTORY — DX: Unspecified osteoarthritis, unspecified site: M19.90

## 2017-03-07 HISTORY — DX: Other complications of anesthesia, initial encounter: T88.59XA

## 2017-03-07 HISTORY — DX: Bronchitis, not specified as acute or chronic: J40

## 2017-03-07 HISTORY — DX: Headache: R51

## 2017-03-07 HISTORY — DX: Headache, unspecified: R51.9

## 2017-03-07 HISTORY — DX: Adverse effect of unspecified anesthetic, initial encounter: T41.45XA

## 2017-03-07 HISTORY — DX: Peripheral vascular disease, unspecified: I73.9

## 2017-03-07 HISTORY — DX: Dyspnea, unspecified: R06.00

## 2017-03-07 LAB — CBC
HCT: 43.2 % (ref 39.0–52.0)
HEMOGLOBIN: 14.5 g/dL (ref 13.0–17.0)
MCH: 31.8 pg (ref 26.0–34.0)
MCHC: 33.6 g/dL (ref 30.0–36.0)
MCV: 94.7 fL (ref 78.0–100.0)
Platelets: 201 10*3/uL (ref 150–400)
RBC: 4.56 MIL/uL (ref 4.22–5.81)
RDW: 13.8 % (ref 11.5–15.5)
WBC: 8.1 10*3/uL (ref 4.0–10.5)

## 2017-03-07 LAB — GLUCOSE, CAPILLARY: Glucose-Capillary: 261 mg/dL — ABNORMAL HIGH (ref 65–99)

## 2017-03-07 LAB — COMPREHENSIVE METABOLIC PANEL
ALK PHOS: 66 U/L (ref 38–126)
ALT: 27 U/L (ref 17–63)
ANION GAP: 8 (ref 5–15)
AST: 31 U/L (ref 15–41)
Albumin: 3.7 g/dL (ref 3.5–5.0)
BILIRUBIN TOTAL: 0.9 mg/dL (ref 0.3–1.2)
BUN: 11 mg/dL (ref 6–20)
CALCIUM: 8.8 mg/dL — AB (ref 8.9–10.3)
CO2: 21 mmol/L — ABNORMAL LOW (ref 22–32)
Chloride: 107 mmol/L (ref 101–111)
Creatinine, Ser: 0.9 mg/dL (ref 0.61–1.24)
GLUCOSE: 232 mg/dL — AB (ref 65–99)
Potassium: 4.6 mmol/L (ref 3.5–5.1)
Sodium: 136 mmol/L (ref 135–145)
TOTAL PROTEIN: 7.1 g/dL (ref 6.5–8.1)

## 2017-03-07 NOTE — Progress Notes (Signed)
Pt. Seen today by A. Kabbe,DNP and also Dr. Michelle Piperssey for anesth. Consult.  Pt. Instructed to remove all facial hair prior to surgery, he reluctantly agrees. Pt. Denies chest or flu concerns today. Pt. holding Xarelto.  Pt. Remarks that his PCP has been dismissed from practice & he is looking for a new one.  Pt. Followed by Dr. Fransico HimNida for the thyroid & also followed by Dr. Darl HouseholderKoneswaren for cardiac.

## 2017-03-07 NOTE — Progress Notes (Signed)
Pt. Took last dose of Xarelto on 10/21, knows not to take anymore until after surgery.

## 2017-03-08 ENCOUNTER — Encounter (HOSPITAL_COMMUNITY): Payer: Self-pay | Admitting: Surgery

## 2017-03-08 MED ORDER — DEXTROSE 5 % IV SOLN
3.0000 g | INTRAVENOUS | Status: AC
  Administered 2017-03-09: 3 g via INTRAVENOUS
  Filled 2017-03-08: qty 3000

## 2017-03-08 NOTE — Progress Notes (Signed)
Anesthesia Chart Review:  Pt is a 56 year old male scheduled for total thyroidectomy on 03/09/2017 with Darnell Levelodd Gerkin, MD  - No PCP  - Cardiologist is Prentice DockerSuresh Koneswaran, MD - Endocrinologist is Purcell NailsGebreselassie Nida, MD  PMH includes:  HTN, DM, stroke (2014), OSA, COPD, PE (s/p IVC filter 2010), hypothyroidism, PVD. Former smoker. BMI 47  Anesthesia history:  - Pt [facility] in [facility] (notes in care everywhere).  Anesthesia records document pt with difficult airway.  Awake intubation using fiberoptic scope. Initial attempt with 7.5 ETT unsuccessful, 2nd attempt with 7.0 ETT successful  Dr.Ossey evaluated pt during pre-admission testing appointment.  Instructed pt to shave off facial hair prior to surgery.  Thyroid is massive.   Medications include: symbicort, lasix, imdur, levothyroxine, metformin, nebivolol, potassium, xarelto.  Last dose xarelto 03/05/17.   BP (!) 151/61   Pulse 79   Temp (!) 36.3 C   Resp 20   Ht 6\' 4"  (1.93 m)   Wt (!) 388 lb 4.8 oz (176.1 kg)   SpO2 99%   BMI 47.27 kg/m   Preoperative labs reviewed.  Glucose 232. HbA1c was 7.5 on 01/10/17  CXR 03/07/17: No active cardiopulmonary disease.  EKG 09/28/16: sinus rhythm  Nuclear stress test 01/14/14:  1. No reversible ischemia or infarction. 2. Normal left ventricular wall motion. 3. Left ventricular ejection fraction 67% 4. Low-risk stress test findings  Echo 01/10/14:  - Procedure narrative: Transthoracic echocardiography. Image quality was suboptimal. The study was technically difficult, as aresult of poor sound wave transmission and body habitus. - Left ventricle: The cavity size was at the upper limits of normal. Systolic function was normal. The estimated ejectionfraction was in the range of 60% to 65%. Regional wall motionabnormalities cannot be excluded. However, no gross regionalvariation noted, particularly on apical-4 and apical-3 chamberviews. Left ventricular diastolic function  parameters werenormal. Borderline concentric LVH. - Mitral valve: Mildly thickened leaflets . There was trivial regurgitation. - Left atrium: The atrium was moderately to severely dilated. - Right ventricle: The cavity size was mildly dilated. Wall thickness was normal. Systolic function was normal.  If glucose acceptable day of surgery, I anticipate pt can proceed with surgery as scheduled.   Rica Mastngela Latash Nouri, FNP-BC [facility]/Anesthesiology Phone: [phone] 03/08/2017 12:36 PM

## 2017-03-09 ENCOUNTER — Ambulatory Visit (HOSPITAL_COMMUNITY): Payer: Medicare PPO | Admitting: Anesthesiology

## 2017-03-09 ENCOUNTER — Ambulatory Visit (HOSPITAL_COMMUNITY): Payer: Medicare PPO | Admitting: Emergency Medicine

## 2017-03-09 ENCOUNTER — Encounter (HOSPITAL_COMMUNITY)
Admission: RE | Disposition: A | Discharge status: Home / Self Care | Payer: Self-pay | Source: Ambulatory Visit | Attending: Surgery

## 2017-03-09 ENCOUNTER — Observation Stay (HOSPITAL_COMMUNITY)
Admission: RE | Admit: 2017-03-09 | Discharge: 2017-03-11 | Disposition: A | Discharge status: Home / Self Care | Payer: Medicare PPO | Source: Ambulatory Visit | Attending: Surgery | Admitting: Surgery

## 2017-03-09 ENCOUNTER — Encounter (HOSPITAL_COMMUNITY): Payer: Self-pay | Admitting: Urology

## 2017-03-09 DIAGNOSIS — Z7901 Long term (current) use of anticoagulants: Secondary | ICD-10-CM | POA: Insufficient documentation

## 2017-03-09 DIAGNOSIS — E042 Nontoxic multinodular goiter: Secondary | ICD-10-CM | POA: Diagnosis not present

## 2017-03-09 DIAGNOSIS — Z87891 Personal history of nicotine dependence: Secondary | ICD-10-CM | POA: Insufficient documentation

## 2017-03-09 DIAGNOSIS — Z79899 Other long term (current) drug therapy: Secondary | ICD-10-CM | POA: Insufficient documentation

## 2017-03-09 DIAGNOSIS — I10 Essential (primary) hypertension: Secondary | ICD-10-CM | POA: Insufficient documentation

## 2017-03-09 DIAGNOSIS — K219 Gastro-esophageal reflux disease without esophagitis: Secondary | ICD-10-CM | POA: Insufficient documentation

## 2017-03-09 DIAGNOSIS — Z23 Encounter for immunization: Secondary | ICD-10-CM | POA: Diagnosis not present

## 2017-03-09 DIAGNOSIS — Z7984 Long term (current) use of oral hypoglycemic drugs: Secondary | ICD-10-CM | POA: Diagnosis not present

## 2017-03-09 DIAGNOSIS — E119 Type 2 diabetes mellitus without complications: Secondary | ICD-10-CM | POA: Insufficient documentation

## 2017-03-09 DIAGNOSIS — E049 Nontoxic goiter, unspecified: Secondary | ICD-10-CM

## 2017-03-09 DIAGNOSIS — J449 Chronic obstructive pulmonary disease, unspecified: Secondary | ICD-10-CM | POA: Insufficient documentation

## 2017-03-09 HISTORY — DX: Obstructive sleep apnea (adult) (pediatric): G47.33

## 2017-03-09 HISTORY — DX: Dependence on other enabling machines and devices: Z99.89

## 2017-03-09 HISTORY — PX: TOTAL THYROIDECTOMY: SHX2547

## 2017-03-09 HISTORY — PX: THYROIDECTOMY: SHX17

## 2017-03-09 LAB — PROTIME-INR
INR: 1.06
PROTHROMBIN TIME: 13.7 s (ref 11.4–15.2)

## 2017-03-09 LAB — GLUCOSE, CAPILLARY
GLUCOSE-CAPILLARY: 158 mg/dL — AB (ref 65–99)
GLUCOSE-CAPILLARY: 177 mg/dL — AB (ref 65–99)
GLUCOSE-CAPILLARY: 154 mg/dL — AB (ref 65–99)
Glucose-Capillary: 161 mg/dL — ABNORMAL HIGH (ref 65–99)

## 2017-03-09 SURGERY — THYROIDECTOMY
Anesthesia: General | Site: Neck

## 2017-03-09 MED ORDER — ROCURONIUM BROMIDE 100 MG/10ML IV SOLN
INTRAVENOUS | Status: DC | PRN
  Administered 2017-03-09: 30 mg via INTRAVENOUS
  Administered 2017-03-09: 20 mg via INTRAVENOUS
  Administered 2017-03-09 (×2): 10 mg via INTRAVENOUS
  Administered 2017-03-09: 30 mg via INTRAVENOUS

## 2017-03-09 MED ORDER — LACTATED RINGERS IV SOLN
INTRAVENOUS | Status: DC
  Administered 2017-03-09 (×2): via INTRAVENOUS

## 2017-03-09 MED ORDER — MEPERIDINE HCL 25 MG/ML IJ SOLN: 6.2500 mg | INTRAMUSCULAR | Status: DC | PRN

## 2017-03-09 MED ORDER — PHENYLEPHRINE HCL 10 MG/ML IJ SOLN
INTRAMUSCULAR | Status: DC | PRN
  Administered 2017-03-09 (×3): 80 ug via INTRAVENOUS
  Administered 2017-03-09: 120 ug via INTRAVENOUS
  Administered 2017-03-09 (×2): 80 ug via INTRAVENOUS
  Administered 2017-03-09: 40 ug via INTRAVENOUS
  Administered 2017-03-09 (×5): 80 ug via INTRAVENOUS

## 2017-03-09 MED ORDER — POTASSIUM CHLORIDE CRYS ER 10 MEQ PO TBCR
10.0000 meq | EXTENDED_RELEASE_TABLET | Freq: Two times a day (BID) | ORAL | Status: DC
  Administered 2017-03-09 – 2017-03-11 (×4): 10 meq via ORAL
  Filled 2017-03-09 (×4): qty 1

## 2017-03-09 MED ORDER — ROCURONIUM BROMIDE 10 MG/ML (PF) SYRINGE
PREFILLED_SYRINGE | INTRAVENOUS | Status: AC
  Filled 2017-03-09: qty 5

## 2017-03-09 MED ORDER — ISOSORBIDE MONONITRATE ER 30 MG PO TB24
30.0000 mg | ORAL_TABLET | Freq: Every day | ORAL | Status: DC
  Administered 2017-03-10 – 2017-03-11 (×2): 30 mg via ORAL
  Filled 2017-03-09 (×2): qty 1

## 2017-03-09 MED ORDER — NEBIVOLOL HCL 10 MG PO TABS
5.0000 mg | ORAL_TABLET | Freq: Every day | ORAL | Status: DC
  Administered 2017-03-10 – 2017-03-11 (×2): 5 mg via ORAL
  Filled 2017-03-09 (×2): qty 1

## 2017-03-09 MED ORDER — INFLUENZA VAC SPLIT QUAD 0.5 ML IM SUSY
0.5000 mL | PREFILLED_SYRINGE | INTRAMUSCULAR | Status: AC
  Administered 2017-03-10: 0.5 mL via INTRAMUSCULAR
  Filled 2017-03-09: qty 0.5

## 2017-03-09 MED ORDER — ONDANSETRON HCL 4 MG/2ML IJ SOLN: 4.0000 mg | Freq: Once | INTRAMUSCULAR | Status: DC | PRN

## 2017-03-09 MED ORDER — INSULIN ASPART 100 UNIT/ML ~~LOC~~ SOLN: 0.0000 [IU] | Freq: Every day | SUBCUTANEOUS | Status: DC

## 2017-03-09 MED ORDER — FENTANYL CITRATE (PF) 250 MCG/5ML IJ SOLN
INTRAMUSCULAR | Status: AC
  Filled 2017-03-09: qty 5

## 2017-03-09 MED ORDER — LEVOTHYROXINE SODIUM 75 MCG PO TABS
150.0000 ug | ORAL_TABLET | Freq: Every day | ORAL | Status: DC
  Administered 2017-03-10 – 2017-03-11 (×2): 150 ug via ORAL
  Filled 2017-03-09 (×2): qty 2

## 2017-03-09 MED ORDER — FENTANYL CITRATE (PF) 100 MCG/2ML IJ SOLN
INTRAMUSCULAR | Status: DC | PRN
  Administered 2017-03-09: 50 ug via INTRAVENOUS
  Administered 2017-03-09: 100 ug via INTRAVENOUS
  Administered 2017-03-09: 50 ug via INTRAVENOUS
  Administered 2017-03-09: 100 ug via INTRAVENOUS
  Administered 2017-03-09: 50 ug via INTRAVENOUS
  Administered 2017-03-09: 100 ug via INTRAVENOUS
  Administered 2017-03-09: 50 ug via INTRAVENOUS

## 2017-03-09 MED ORDER — SUCCINYLCHOLINE CHLORIDE 200 MG/10ML IV SOSY
PREFILLED_SYRINGE | INTRAVENOUS | Status: AC
  Filled 2017-03-09: qty 10

## 2017-03-09 MED ORDER — SUGAMMADEX SODIUM 500 MG/5ML IV SOLN
INTRAVENOUS | Status: AC
  Filled 2017-03-09: qty 5

## 2017-03-09 MED ORDER — ONDANSETRON HCL 4 MG/2ML IJ SOLN
INTRAMUSCULAR | Status: AC
  Filled 2017-03-09: qty 2

## 2017-03-09 MED ORDER — ONDANSETRON HCL 4 MG/2ML IJ SOLN: 4.0000 mg | Freq: Four times a day (QID) | INTRAMUSCULAR | Status: DC | PRN

## 2017-03-09 MED ORDER — HYDROMORPHONE HCL 1 MG/ML IJ SOLN
INTRAMUSCULAR | Status: AC
  Administered 2017-03-09: 0.25 mg via INTRAVENOUS
  Filled 2017-03-09: qty 1

## 2017-03-09 MED ORDER — PHENYLEPHRINE 40 MCG/ML (10ML) SYRINGE FOR IV PUSH (FOR BLOOD PRESSURE SUPPORT)
PREFILLED_SYRINGE | INTRAVENOUS | Status: AC
  Filled 2017-03-09: qty 10

## 2017-03-09 MED ORDER — MIDAZOLAM HCL 2 MG/2ML IJ SOLN
INTRAMUSCULAR | Status: AC
  Filled 2017-03-09: qty 2

## 2017-03-09 MED ORDER — ACETAMINOPHEN 650 MG RE SUPP: 650.0000 mg | Freq: Four times a day (QID) | RECTAL | Status: DC | PRN

## 2017-03-09 MED ORDER — MOMETASONE FURO-FORMOTEROL FUM 200-5 MCG/ACT IN AERO
2.0000 | INHALATION_SPRAY | Freq: Two times a day (BID) | RESPIRATORY_TRACT | Status: DC
  Filled 2017-03-09: qty 8.8

## 2017-03-09 MED ORDER — METFORMIN HCL ER 500 MG PO TB24
500.0000 mg | ORAL_TABLET | Freq: Two times a day (BID) | ORAL | Status: DC
  Administered 2017-03-09 – 2017-03-11 (×4): 500 mg via ORAL
  Filled 2017-03-09 (×4): qty 1

## 2017-03-09 MED ORDER — FUROSEMIDE 20 MG PO TABS: 20.0000 mg | ORAL_TABLET | Freq: Every day | ORAL | Status: DC | PRN

## 2017-03-09 MED ORDER — HYDROMORPHONE HCL 1 MG/ML IJ SOLN
1.0000 mg | INTRAMUSCULAR | Status: DC | PRN
  Administered 2017-03-09 – 2017-03-10 (×4): 1 mg via INTRAVENOUS
  Filled 2017-03-09 (×4): qty 1

## 2017-03-09 MED ORDER — HYDROMORPHONE HCL 1 MG/ML IJ SOLN
INTRAMUSCULAR | Status: AC
  Administered 2017-03-09: 1 mg
  Filled 2017-03-09: qty 1

## 2017-03-09 MED ORDER — CHLORHEXIDINE GLUCONATE CLOTH 2 % EX PADS: 6.0000 | MEDICATED_PAD | Freq: Once | CUTANEOUS | Status: DC

## 2017-03-09 MED ORDER — ALLOPURINOL 300 MG PO TABS
300.0000 mg | ORAL_TABLET | Freq: Every day | ORAL | Status: DC
  Administered 2017-03-10 – 2017-03-11 (×2): 300 mg via ORAL
  Filled 2017-03-09 (×2): qty 1

## 2017-03-09 MED ORDER — CALCIUM CARBONATE 1250 (500 CA) MG PO TABS
2.0000 | ORAL_TABLET | Freq: Three times a day (TID) | ORAL | Status: DC
  Administered 2017-03-09 – 2017-03-10 (×2): 1000 mg via ORAL
  Filled 2017-03-09 (×2): qty 1

## 2017-03-09 MED ORDER — ONDANSETRON HCL 4 MG/2ML IJ SOLN
INTRAMUSCULAR | Status: DC | PRN
  Administered 2017-03-09: 4 mg via INTRAVENOUS

## 2017-03-09 MED ORDER — HYDROCODONE-ACETAMINOPHEN 5-325 MG PO TABS
1.0000 | ORAL_TABLET | ORAL | Status: DC | PRN
  Administered 2017-03-10: 2 via ORAL
  Administered 2017-03-10: 1 via ORAL
  Administered 2017-03-10 – 2017-03-11 (×3): 2 via ORAL
  Filled 2017-03-09 (×5): qty 2

## 2017-03-09 MED ORDER — INSULIN ASPART 100 UNIT/ML ~~LOC~~ SOLN
0.0000 [IU] | Freq: Three times a day (TID) | SUBCUTANEOUS | Status: DC
  Administered 2017-03-09 – 2017-03-10 (×2): 2 [IU] via SUBCUTANEOUS
  Administered 2017-03-10: 1 [IU] via SUBCUTANEOUS
  Administered 2017-03-10: 2 [IU] via SUBCUTANEOUS
  Administered 2017-03-11: 1 [IU] via SUBCUTANEOUS

## 2017-03-09 MED ORDER — PROPOFOL 10 MG/ML IV BOLUS
INTRAVENOUS | Status: AC
  Filled 2017-03-09: qty 40

## 2017-03-09 MED ORDER — HEMOSTATIC AGENTS (NO CHARGE) OPTIME
TOPICAL | Status: DC | PRN
  Administered 2017-03-09: 1 via TOPICAL

## 2017-03-09 MED ORDER — SUGAMMADEX SODIUM 500 MG/5ML IV SOLN
INTRAVENOUS | Status: DC | PRN
  Administered 2017-03-09: 352 mg via INTRAVENOUS

## 2017-03-09 MED ORDER — LIDOCAINE HCL (CARDIAC) 20 MG/ML IV SOLN
INTRAVENOUS | Status: DC | PRN
  Administered 2017-03-09: 100 mg via INTRAVENOUS

## 2017-03-09 MED ORDER — LIDOCAINE 2% (20 MG/ML) 5 ML SYRINGE
INTRAMUSCULAR | Status: AC
  Filled 2017-03-09: qty 5

## 2017-03-09 MED ORDER — MIDAZOLAM HCL 5 MG/5ML IJ SOLN
INTRAMUSCULAR | Status: DC | PRN
  Administered 2017-03-09: 2 mg via INTRAVENOUS

## 2017-03-09 MED ORDER — 0.9 % SODIUM CHLORIDE (POUR BTL) OPTIME
TOPICAL | Status: DC | PRN
  Administered 2017-03-09: 1000 mL

## 2017-03-09 MED ORDER — ONDANSETRON 4 MG PO TBDP: 4.0000 mg | ORAL_TABLET | Freq: Four times a day (QID) | ORAL | Status: DC | PRN

## 2017-03-09 MED ORDER — ACETAMINOPHEN 325 MG PO TABS: 650.0000 mg | ORAL_TABLET | Freq: Four times a day (QID) | ORAL | Status: DC | PRN

## 2017-03-09 MED ORDER — PROPOFOL 10 MG/ML IV BOLUS
INTRAVENOUS | Status: DC | PRN
  Administered 2017-03-09: 150 mg via INTRAVENOUS
  Administered 2017-03-09: 50 mg via INTRAVENOUS

## 2017-03-09 MED ORDER — KCL IN DEXTROSE-NACL 20-5-0.45 MEQ/L-%-% IV SOLN
INTRAVENOUS | Status: DC
  Administered 2017-03-09 – 2017-03-10 (×2): via INTRAVENOUS
  Filled 2017-03-09 (×2): qty 1000

## 2017-03-09 MED ORDER — HYDROMORPHONE HCL 1 MG/ML IJ SOLN
0.2500 mg | INTRAMUSCULAR | Status: DC | PRN
  Administered 2017-03-09 (×2): 0.25 mg via INTRAVENOUS

## 2017-03-09 SURGICAL SUPPLY — 53 items
ATTRACTOMAT 16X20 MAGNETIC DRP (DRAPES) ×3 IMPLANT
BLADE CLIPPER SURG (BLADE) IMPLANT
BLADE SURG 10 STRL SS (BLADE) ×3 IMPLANT
BLADE SURG 15 STRL LF DISP TIS (BLADE) ×1 IMPLANT
BLADE SURG 15 STRL SS (BLADE) ×2
CANISTER SUCT 3000ML PPV (MISCELLANEOUS) ×3 IMPLANT
CHLORAPREP W/TINT 10.5 ML (MISCELLANEOUS) ×3 IMPLANT
CLIP VESOCCLUDE MED 24/CT (CLIP) ×9 IMPLANT
CLIP VESOCCLUDE SM WIDE 24/CT (CLIP) ×3 IMPLANT
CLOSURE WOUND 1/2 X4 (GAUZE/BANDAGES/DRESSINGS) ×1
COVER SURGICAL LIGHT HANDLE (MISCELLANEOUS) ×3 IMPLANT
CRADLE DONUT ADULT HEAD (MISCELLANEOUS) ×3 IMPLANT
DRAIN CHANNEL 7F 3/4 FLAT (WOUND CARE) ×3 IMPLANT
DRAIN JACKSON RD 7FR 3/32 (WOUND CARE) ×3 IMPLANT
DRAPE LAPAROTOMY 100X72 PEDS (DRAPES) ×3 IMPLANT
DRAPE UTILITY XL STRL (DRAPES) ×3 IMPLANT
ELECT CAUTERY BLADE 6.4 (BLADE) ×3 IMPLANT
ELECT REM PT RETURN 9FT ADLT (ELECTROSURGICAL) ×3
ELECTRODE REM PT RTRN 9FT ADLT (ELECTROSURGICAL) ×1 IMPLANT
EVACUATOR SILICONE 100CC (DRAIN) ×3 IMPLANT
GAUZE SPONGE 4X4 12PLY STRL (GAUZE/BANDAGES/DRESSINGS) ×3 IMPLANT
GAUZE SPONGE 4X4 16PLY XRAY LF (GAUZE/BANDAGES/DRESSINGS) ×9 IMPLANT
GLOVE SURG ORTHO 8.0 STRL STRW (GLOVE) ×3 IMPLANT
GOWN STRL REUS W/ TWL LRG LVL3 (GOWN DISPOSABLE) ×1 IMPLANT
GOWN STRL REUS W/ TWL XL LVL3 (GOWN DISPOSABLE) ×1 IMPLANT
GOWN STRL REUS W/TWL LRG LVL3 (GOWN DISPOSABLE) ×2
GOWN STRL REUS W/TWL XL LVL3 (GOWN DISPOSABLE) ×2
HEMOSTAT SURGICEL 2X4 FIBR (HEMOSTASIS) ×3 IMPLANT
ILLUMINATOR WAVEGUIDE N/F (MISCELLANEOUS) IMPLANT
KIT BASIN OR (CUSTOM PROCEDURE TRAY) ×3 IMPLANT
KIT ROOM TURNOVER OR (KITS) ×3 IMPLANT
LIGHT WAVEGUIDE WIDE FLAT (MISCELLANEOUS) ×3 IMPLANT
NS IRRIG 1000ML POUR BTL (IV SOLUTION) ×3 IMPLANT
PACK SURGICAL SETUP 50X90 (CUSTOM PROCEDURE TRAY) ×3 IMPLANT
PAD ARMBOARD 7.5X6 YLW CONV (MISCELLANEOUS) ×3 IMPLANT
PENCIL BUTTON HOLSTER BLD 10FT (ELECTRODE) ×3 IMPLANT
SHEARS HARMONIC 9CM CVD (BLADE) ×3 IMPLANT
SPECIMEN JAR MEDIUM (MISCELLANEOUS) ×3 IMPLANT
SPONGE INTESTINAL PEANUT (DISPOSABLE) ×3 IMPLANT
SPONGE LAP 18X18 X RAY DECT (DISPOSABLE) ×3 IMPLANT
STRIP CLOSURE SKIN 1/2X4 (GAUZE/BANDAGES/DRESSINGS) ×2 IMPLANT
SUT ETHILON 2 0 FS 18 (SUTURE) ×3 IMPLANT
SUT MNCRL AB 4-0 PS2 18 (SUTURE) ×3 IMPLANT
SUT SILK 2 0 (SUTURE) ×2
SUT SILK 2 0 SH (SUTURE) ×3 IMPLANT
SUT SILK 2 0 SH CR/8 (SUTURE) ×3 IMPLANT
SUT SILK 2-0 18XBRD TIE 12 (SUTURE) ×1 IMPLANT
SUT VIC AB 3-0 SH 18 (SUTURE) ×6 IMPLANT
SYR BULB 3OZ (MISCELLANEOUS) ×3 IMPLANT
TOWEL OR 17X24 6PK STRL BLUE (TOWEL DISPOSABLE) ×3 IMPLANT
TOWEL OR 17X26 10 PK STRL BLUE (TOWEL DISPOSABLE) ×3 IMPLANT
TUBE CONNECTING 12'X1/4 (SUCTIONS) ×1
TUBE CONNECTING 12X1/4 (SUCTIONS) ×2 IMPLANT

## 2017-03-09 NOTE — Anesthesia Procedure Notes (Cosign Needed)
Procedure Name: Intubation Date/Time: 03/09/2017 8:36 AM Performed by: Arta BruceSSEY, KEVIN Pre-anesthesia Checklist: Patient identified, Emergency Drugs available, Suction available, Patient being monitored and Timeout performed Patient Re-evaluated:Patient Re-evaluated prior to induction Oxygen Delivery Method: Circle system utilized Preoxygenation: Pre-oxygenation with 100% oxygen Induction Type: IV induction Laryngoscope Size: Glidescope and 4 Grade View: Grade II Tube type: Oral Tube size: 8.0 mm Airway Equipment and Method: Patient positioned with wedge pillow and Stylet Placement Confirmation: ETT inserted through vocal cords under direct vision,  positive ETCO2 and breath sounds checked- equal and bilateral

## 2017-03-09 NOTE — Interval H&P Note (Signed)
History and Physical Interval Note:  03/09/2017 8:01 AM  Frank Shelton  has presented today for surgery, with the diagnosis of multinoduler thyroid goiter   The various methods of treatment have been discussed with the patient and family. After consideration of risks, benefits and other options for treatment, the patient has consented to    Procedure(s): TOTAL THYROIDECTOMY (N/A) as a surgical intervention .    The patient's history has been reviewed, patient examined, no change in status, stable for surgery.  I have reviewed the patient's chart and labs.  Questions were answered to the patient's satisfaction.    [name], MD [facility]  Surgery Office: [phone]    [name] MJudie Petit

## 2017-03-09 NOTE — H&P (View-Only) (Signed)
General Surgery - [facility]  Frank Shelton 02/22/2017 9:44 AM Location: [facility] Patient #: [id number] DOB: 12/27/1960 Single / Language: English / Race: White Male   History of Present Illness (Frank Shelton M. Frank Derocher MD; 02/22/2017 10:18 AM) The patient is a 56 year old male who presents with a complaint of Enlarged thyroid.  CC: thyroid goiter with compressive symptoms  Patient is referred by Dr. Mark Shelton for evaluation and management of multinodular thyroid goiter with compressive symptoms. Patient's endocrinologist is Dr. G. Shelton. Patient has had a long-standing history of thyroid disease. He has been on thyroid medication for at least 15 years. He has had gradual enlargement of his thyroid gland. Recent ultrasound shows multiple thyroid nodules of which some are mildly suspicious and biopsy was recommended by the radiologist. Patient has mild compressive symptoms with inability to sleep recumbent, sleep apnea, and dysphagia for both solids and liquids. He has been evaluated by his endocrinologist. They discussed percutaneous biopsy of thyroid nodules versus proceeding with thyroidectomy. After evaluation, the patient had decided to proceed with total thyroidectomy and avoid preoperative biopsies. Patient was seen and evaluated by Dr. Mark Shelton. However, the patient has a history of a difficult airway at the time of the procedure for rotator cuff repair at [facility] 14 years ago. Dr. Jenkins did not think it was appropriate for this patient to be taken care of in [place]. Therefore the patient was referred to [place] for surgical evaluation and anesthesia evaluation preoperatively with surgery to be done here. Patient has no significant family history of endocrine disease. There is no history of endocrine malignancy. Patient does have a significant past medical history of deep venous thrombosis. He has also had  cardiac disease and a mild stroke. He is on chronic anticoagulation. For procedures, he stops the anticoagulations 3 days prior to his procedure. Patient presents today to discuss options for management of his multinodular thyroid goiter.   Past Surgical History ([name], LPN; 02/22/2017 10:13 AM) Shoulder Surgery  Right.  Diagnostic Studies History ([name], LPN; 02/22/2017 10:13 AM) Colonoscopy  1-5 years ago  Medication History ([name], RMA; 02/22/2017 9:52 AM) Levothyroxine Sodium (125MCG Tablet, Oral) Active. B Complex-B12 (Oral) Active. IBU (800MG Tablet, Oral) Active. Vitamin D (Ergocalciferol) (50000UNIT Capsule, BID Oral) Active. Synthroid (125MCG Tablet, Oral) Active. Isosorbide (100GM/220ML Solution, Oral) Active. Xarelto (20MG Tablet, Oral) Active. Budesonide (Inhalation) (200MCG/INH Aerosol Powder, Inhalation) Active. Lasix (20MG Tablet, Oral) Active. Multiple Vitamin (Oral) Active. Bystolic (5MG Tablet, Oral) Active. Allopurinol (300MG Tablet, Oral) Active. Colchicine (0.6MG Tablet, Oral) Active. Gabapentin (600MG Tablet, Oral) Active. MetFORMIN HCl (500MG Tablet, Oral) Active. Medications Reconciled  Social History ([name], LPN; 02/22/2017 10:13 AM) Alcohol use  Occasional alcohol use. No caffeine use  Tobacco use  Former smoker.  Family History ([name], LPN; 02/22/2017 10:13 AM) Alcohol Abuse  Father, Son. Cerebrovascular Accident  Mother. Diabetes Mellitus  Father, Sister. Heart Disease  Mother. Heart disease in male family member before age 65  Heart disease in male family member before age 55  Hypertension  Mother. Migraine Headache  Mother.  Other Problems ([name], LPN; 02/22/2017 10:13 AM) Alcohol Abuse  Anxiety Disorder  Chest pain  Congestive Heart Failure  Depression  Diabetes Mellitus  Gastroesophageal Reflux Disease  High blood pressure  Myocardial infarction   Pulmonary Embolism / Blood Clot in Legs  Sleep Apnea  Thyroid Disease     Review of Systems ([name] LPN; 02/22/2017 10:13   AM) General Not Present- Appetite Loss, Chills, Fatigue, Fever, Night Sweats, Weight Gain and Weight Loss. Skin Present- Dryness, New Lesions and Non-Healing Wounds. Not Present- Change in Wart/Mole, Hives, Jaundice, Rash and Ulcer. HEENT Present- Seasonal Allergies and Wears glasses/contact lenses. Not Present- Earache, Hearing Loss, Hoarseness, Nose Bleed, Oral Ulcers, Ringing in the Ears, Sinus Pain, Sore Throat, Visual Disturbances and Yellow Eyes. Respiratory Present- Difficulty Breathing and Snoring. Not Present- Bloody sputum, Chronic Cough and Wheezing. Breast Not Present- Breast Mass, Breast Pain, Nipple Discharge and Skin Changes. Cardiovascular Present- Difficulty Breathing Lying Down, Leg Cramps, Shortness of Breath and Swelling of Extremities. Not Present- Chest Pain, Palpitations and Rapid Heart Rate. Gastrointestinal Present- Constipation, Difficulty Swallowing, Excessive gas and Gets full quickly at meals. Not Present- Abdominal Pain, Bloating, Bloody Stool, Change in Bowel Habits, Chronic diarrhea, Hemorrhoids, Indigestion, Nausea, Rectal Pain and Vomiting. Male Genitourinary Present- Frequency, Impotence, Nocturia and Urgency. Not Present- Blood in Urine, Change in Urinary Stream, Painful Urination and Urine Leakage.  Vitals ([name]; 02/22/2017 9:46 AM) 02/22/2017 9:45 AM Weight: 390.2 lb Height: 76in Body Surface Area: 2.94 m Body Mass Index: 47.5 kg/m  Temp.: 98.4F  Pulse: 88 (Regular)  BP: 135/72 (Sitting, Left Arm, Standard)       Physical Exam ([name] MD; 02/22/2017 10:19 AM) The physical exam findings are as follows: Note:CONSTITUTIONAL See vital signs recorded above  GENERAL APPEARANCE Development: normal Nutritional status: normal Gross deformities: none  SKIN Rash, lesions, ulcers:  none Induration, erythema: none Nodules: none palpable  EYES Conjunctiva and lids: normal Pupils: equal and reactive Iris: normal bilaterally  EARS, NOSE, MOUTH, THROAT External ears: no lesion or deformity External nose: no lesion or deformity Hearing: grossly normal Lips: no lesion or deformity Dentition: normal for age Oral mucosa: moist  NECK Symmetric: yes Trachea: midline Thyroid: Enlarged thyroid gland situated relatively high and anterior in the neck, firm, multinodular, nontender; mobile with swallowing  CHEST Respiratory effort: normal Retraction or accessory muscle use: no Breath sounds: normal bilaterally Rales, rhonchi, wheeze: none  CARDIOVASCULAR Auscultation: regular rhythm, normal rate Murmurs: none Pulses: carotid and radial pulse 2+ palpable Lower extremity edema: marked bilateral  MUSCULOSKELETAL Station and gait: normal Digits and nails: no clubbing or cyanosis Muscle strength: grossly normal all extremities Range of motion: grossly normal all extremities Deformity: none  LYMPHATIC Cervical: none palpable Supraclavicular: none palpable  PSYCHIATRIC Oriented to person, place, and time: yes Mood and affect: normal for situation Judgment and insight: appropriate for situation    Assessment & Plan (Avayah Raffety M. Garielle Mroz MD; 02/22/2017 10:22 AM) THYROID GOITER (E04.9) MULTIPLE THYROID NODULES (E04.2) Current Plans Pt Education - Pamphlet Given - The Thyroid Book: discussed with patient and provided information. Patient presents on referral from his general surgeon and his endocrinologist in [place]. Patient is provided with written literature on thyroid surgery to review at home.  Patient has an enlarged multinodular thyroid goiter. On ultrasound, some of the nodules are mildly suspicious and biopsy has been recommended but not performed. Patient does have some mild compressive symptoms including dysphagia, sleep apnea, and shortness of  breath. Patient is on thyroid medication and taking Synthroid 150 g daily.  We discussed options for management. Either the patient needs to undergo percutaneous needle biopsy of his suspicious thyroid nodules with plan of continued observation if these are benign, or the patient needs to consider total thyroidectomy for definitive diagnosis and for management of his compressive symptoms. Patient has been recommended to have surgery by his endocrinologist   and his surgeon. He now presents to discuss total thyroidectomy. We discussed the procedure in detail. We discussed the risk and benefits including the risk of injury to recurrent laryngeal nerves and parathyroid glands. We discussed the location and size of the surgical incision. We discussed the fact that the patient's overall risk for surgery was increased due to his history of anticoagulation, deep venous thrombosis, cardiac disease, and stroke. Patient will need to see anesthesiology preoperatively for evaluation of a potentially difficult airway. We would plan an overnight stay following surgery for observation. Patient will of course be on lifelong thyroid hormone replacement following surgery. Patient understands all these issues and wishes to proceed with surgery in the near future.  The risks and benefits of the procedure have been discussed at length with the patient. The patient understands the proposed procedure, potential alternative treatments, and the course of recovery to be expected. All of the patient's questions have been answered at this time. The patient wishes to proceed with surgery.  Ules Marsala, MD [facility] Office: [phone]   

## 2017-03-09 NOTE — Anesthesia Preprocedure Evaluation (Signed)
Anesthesia Evaluation  Patient identified by MRN, date of birth, ID band Patient awake    Reviewed: Allergy & Precautions, NPO status , Patient's Chart, lab work & pertinent test results  Airway Mallampati: I  TM Distance: >3 FB Neck ROM: Full    Dental   Pulmonary COPD, former smoker,    Pulmonary exam normal        Cardiovascular hypertension, Pt. on medications Normal cardiovascular exam     Neuro/Psych    GI/Hepatic GERD  Medicated and Controlled,  Endo/Other  diabetes, Type 2, Oral Hypoglycemic Agents  Renal/GU      Musculoskeletal   Abdominal   Peds  Hematology   Anesthesia Other Findings   Reproductive/Obstetrics                             Anesthesia Physical Anesthesia Plan  ASA: III  Anesthesia Plan: General   Post-op Pain Management:    Induction: Intravenous  PONV Risk Score and Plan: 2 and Ondansetron and Dexamethasone  Airway Management Planned: Video Laryngoscope Planned  Additional Equipment:   Intra-op Plan:   Post-operative Plan: Extubation in OR  Informed Consent: I have reviewed the patients History and Physical, chart, labs and discussed the procedure including the risks, benefits and alternatives for the proposed anesthesia with the patient or authorized representative who has indicated his/her understanding and acceptance.     Plan Discussed with: CRNA and Surgeon  Anesthesia Plan Comments:         Anesthesia Quick Evaluation

## 2017-03-09 NOTE — Transfer of Care (Cosign Needed)
Immediate Anesthesia Transfer of Care Note  Patient: Frank Shelton  Procedure(s) Performed: TOTAL THYROIDECTOMY (N/A Neck)  Patient Location: PACU  Anesthesia Type:General  Level of Consciousness: awake, alert  and oriented  Airway & Oxygen Therapy: Patient Spontanous Breathing and Patient connected to face mask oxygen  Post-op Assessment: Report given to RN and Post -op Vital signs reviewed and stable  Post vital signs: Reviewed and stable  Last Vitals:  Vitals:   03/09/17 0807 03/09/17 1124  BP: (!) 166/78 (!) 148/72  Pulse: 91 91  Resp: 20 17  Temp: (!) 36.4 C 36.9 C  SpO2: 98% 97%    Last Pain:  Vitals:   03/09/17 0807  TempSrc: Oral         Complications: No apparent anesthesia complications

## 2017-03-09 NOTE — Brief Op Note (Signed)
03/09/2017  11:10 AM  PATIENT:  Frank Shelton  56 y.o. male  PRE-OPERATIVE DIAGNOSIS:  multinoduler thyroid goiter   POST-OPERATIVE DIAGNOSIS:  multinoduler thyroid goiter   PROCEDURE:  Procedure(s): TOTAL THYROIDECTOMY (N/A)  SURGEON:  Surgeon(s) and Role:    * Darnell LevelGerkin, Unika Nazareno, MD - Primary  PHYSICIAN ASSISTANT: Magnus IvanSheila Bowman, RNFA  ANESTHESIA:   general  EBL:  400 mL   BLOOD ADMINISTERED:none  DRAINS: (7 mm) Jackson-Pratt drain(s) with closed bulb suction in the thyroid bed   LOCAL MEDICATIONS USED:  NONE  SPECIMEN:  Excision  DISPOSITION OF SPECIMEN:  PATHOLOGY  COUNTS:  YES  TOURNIQUET:  * No tourniquets in log *  DICTATION: .Other Dictation: Dictation Number [id number]  PLAN OF CARE: Admit to inpatient   PATIENT DISPOSITION:  PACU - hemodynamically stable.   Delay start of Pharmacological VTE agent (>24hrs) due to surgical blood loss or risk of bleeding: yes  [name], MD [facility] Office: [phone]

## 2017-03-09 NOTE — Anesthesia Postprocedure Evaluation (Signed)
Anesthesia Post Note  Patient: Frank Shelton  Procedure(s) Performed: TOTAL THYROIDECTOMY (N/A Neck)     Patient location during evaluation: PACU Anesthesia Type: General Level of consciousness: awake and alert Pain management: pain level controlled Vital Signs Assessment: post-procedure vital signs reviewed and stable Respiratory status: spontaneous breathing, nonlabored ventilation, respiratory function stable and patient connected to nasal cannula oxygen Cardiovascular status: blood pressure returned to baseline and stable Postop Assessment: no apparent nausea or vomiting Anesthetic complications: no    Last Vitals:  Vitals:   03/09/17 1245 03/09/17 1300  BP: 132/77 135/73  Pulse: 89 87  Resp: (!) 22 (!) 21  Temp:    SpO2: 92% 94%    Last Pain:  Vitals:   03/09/17 1245  TempSrc:   PainSc: [name]                 [name]

## 2017-03-09 NOTE — Progress Notes (Signed)
RT called to PACU due to patient wanting to be placed on CPAP machine. Patient has home unit. When RT arrived RN had placed patient on CPAP. RT ensured unit plugged into red outlet and inspected cord for frays/damage. Patient is resting comfortably. Vitals are stable and sats are 92%. RT will continue to monitor.

## 2017-03-09 NOTE — Progress Notes (Signed)
Patient has home CPAP and mask at bedside.  The CPAP has no frayed or exposed wires.  Patient family is in the room to assist with placement of the mask once patient is ready.

## 2017-03-10 ENCOUNTER — Encounter (HOSPITAL_COMMUNITY): Payer: Self-pay | Admitting: Surgery

## 2017-03-10 DIAGNOSIS — E042 Nontoxic multinodular goiter: Secondary | ICD-10-CM | POA: Diagnosis not present

## 2017-03-10 LAB — BASIC METABOLIC PANEL
ANION GAP: 8 (ref 5–15)
BUN: 14 mg/dL (ref 6–20)
CHLORIDE: 100 mmol/L — AB (ref 101–111)
CO2: 27 mmol/L (ref 22–32)
Calcium: 8.3 mg/dL — ABNORMAL LOW (ref 8.9–10.3)
Creatinine, Ser: 0.9 mg/dL (ref 0.61–1.24)
Glucose, Bld: 163 mg/dL — ABNORMAL HIGH (ref 65–99)
POTASSIUM: 3.9 mmol/L (ref 3.5–5.1)
SODIUM: 135 mmol/L (ref 135–145)

## 2017-03-10 LAB — GLUCOSE, CAPILLARY
GLUCOSE-CAPILLARY: 144 mg/dL — AB (ref 65–99)
GLUCOSE-CAPILLARY: 145 mg/dL — AB (ref 65–99)
GLUCOSE-CAPILLARY: 146 mg/dL — AB (ref 65–99)
GLUCOSE-CAPILLARY: 179 mg/dL — AB (ref 65–99)
Glucose-Capillary: 170 mg/dL — ABNORMAL HIGH (ref 65–99)
Glucose-Capillary: 173 mg/dL — ABNORMAL HIGH (ref 65–99)

## 2017-03-10 MED ORDER — GABAPENTIN 300 MG PO CAPS
600.0000 mg | ORAL_CAPSULE | Freq: Three times a day (TID) | ORAL | Status: DC
  Administered 2017-03-10 – 2017-03-11 (×3): 600 mg via ORAL
  Filled 2017-03-10 (×2): qty 2

## 2017-03-10 MED ORDER — CALCIUM CARBONATE 1250 (500 CA) MG PO TABS
2.0000 | ORAL_TABLET | Freq: Four times a day (QID) | ORAL | Status: DC
  Administered 2017-03-10 – 2017-03-11 (×4): 1000 mg via ORAL
  Filled 2017-03-10 (×4): qty 1

## 2017-03-10 MED ORDER — SODIUM CHLORIDE 0.9 % IV SOLN
2.0000 g | Freq: Once | INTRAVENOUS | Status: AC
  Administered 2017-03-10: 2 g via INTRAVENOUS
  Filled 2017-03-10: qty 20

## 2017-03-10 NOTE — Progress Notes (Signed)
  General Surgery [facility]  Assessment & Plan: POD#1 - status post total thyroidectomy  Tolerating solid diet  IV pain Rx - will ask patient to switch to oral today  Increase oral calcium to QID  IV calcium gluconate for low calcium this AM  Continue drain today - likely remove in AM 10/27  Probably home tomorrow        Frank Hecklerodd M. Kamie Korber, MD, [facility]       [facility]       Office: [phone]    Chief Complaint: Neck pain.  Tolerating diet.  Subjective: Patient in bed.  Relatively comfortable. Tolerating solid diet.  Taking po meds.  Objective: Vital signs in last 24 hours: Temp:  [97.7 F (36.5 C)-98.6 F (37 C)] 98 F (36.7 C) (10/26 0552) Pulse Rate:  [74-94] 76 (10/26 0552) Resp:  [17-23] 18 (10/26 0552) BP: (121-153)/(60-82) 137/63 (10/26 0552) SpO2:  [92 %-98 %] 95 % (10/26 0552) Last BM Date:  (pta)  Intake/Output from previous day: 10/25 0701 - 10/26 0700 In: 2152.5 [I.V.:2152.5] Out: 2575 [Urine:2100; Drains:75; Blood:400] Intake/Output this shift: No intake/output data recorded.  Physical Exam: HEENT - sclerae clear, mucous membranes moist Neck - wound dry and intact; mild STS; voice with slight hoarseness; JP drain with dark serosanguinous output Chest - clear bilaterally Cor - RRR Ext - no edema, non-tender Neuro - alert & oriented, no focal deficits  Lab Results:   Recent Labs  03/07/17 1043  WBC 8.1  HGB 14.5  HCT 43.2  PLT 201   BMET  Recent Labs  03/07/17 1043 03/10/17 0258  NA 136 135  K 4.6 3.9  CL 107 100*  CO2 21* 27  GLUCOSE 232* 163*  BUN 11 14  CREATININE 0.90 0.90  CALCIUM 8.8* 8.3*   PT/INR  Recent Labs  03/09/17 0645  LABPROT 13.7  INR 1.06   Comprehensive Metabolic Panel:    Component Value Date/Time   NA 135 03/10/2017 0258   NA 136 03/07/2017 1043   K 3.9 03/10/2017 0258   K 4.6 03/07/2017 1043   CL 100 (L) 03/10/2017 0258   CL 107 03/07/2017 1043   CO2 27  03/10/2017 0258   CO2 21 (L) 03/07/2017 1043   BUN 14 03/10/2017 0258   BUN 11 03/07/2017 1043   CREATININE 0.90 03/10/2017 0258   CREATININE 0.90 03/07/2017 1043   CREATININE 0.84 01/10/2017 1007   GLUCOSE 163 (H) 03/10/2017 0258   GLUCOSE 232 (H) 03/07/2017 1043   CALCIUM 8.3 (L) 03/10/2017 0258   CALCIUM 8.8 (L) 03/07/2017 1043   AST 31 03/07/2017 1043   AST 26 02/02/2017 1010   ALT 27 03/07/2017 1043   ALT 29 02/02/2017 1010   ALKPHOS 66 03/07/2017 1043   ALKPHOS 53 02/02/2017 1010   BILITOT 0.9 03/07/2017 1043   BILITOT 0.5 02/02/2017 1010   PROT 7.1 03/07/2017 1043   PROT 6.9 02/02/2017 1010   ALBUMIN 3.7 03/07/2017 1043   ALBUMIN 3.6 02/02/2017 1010    Studies/Results: No results found.    Frank Shelton M 03/10/2017  Patient ID: Frank Shelton, male   DOB: 1961-03-14, 56 y.o.   MRN: [id number]

## 2017-03-10 NOTE — Op Note (Signed)
NAME:  Frank Shelton, Frank Shelton                     ACCOUNT NO.:  MEDICAL RECORD NO.:  [id number]  LOCATION:                                 FACILITY:  PHYSICIAN:  Frank Heckler, MD           DATE OF BIRTH:  DATE OF PROCEDURE:  03/09/2017                              OPERATIVE REPORT   PREOPERATIVE DIAGNOSIS:  Thyroid goiter with compressive symptoms.  POSTOPERATIVE DIAGNOSIS:  Thyroid goiter with compressive symptoms.  PROCEDURE:  Total thyroidectomy.  SURGEON:  Frank Heckler, MD.  ASSISTANT:  Frank Shelton, RNFA.  ANESTHESIA:  General per Dr. Arta Shelton.  ESTIMATED BLOOD LOSS:  400 mL.  PREPARATION:  ChloraPrep.  COMPLICATIONS:  None.  INDICATIONS:  The patient is a 56 year old male with a longstanding multinodular thyroid goiter.  He is followed by Dr. Fransico Shelton from Endocrinology.  He was evaluated by Dr. Franky Shelton at [facility] [place].  He was referred to my practice due to his history of sleep apnea, dysphagia of both solids and liquids, and history of difficult airway at the time of rotator cuff repair at [facility].  The patient now comes to Surgery for total thyroidectomy.  BODY OF REPORT:  Procedure was done in OR #1 at the [facility].  The patient was brought to the operating room and placed in supine position on the operating room table.  Following administration of general anesthesia, the patient was positioned and then prepped and draped in the usual aseptic fashion.  After ascertaining that an adequate level of anesthesia had been achieved, a Kocher incision was made with a #10 blade.  Dissection was carried through subcutaneous tissues and platysma.  Skin flaps were elevated cephalad and caudad from the thyroid notch to the sternal notch.  The Mahorner self-retaining retractor was placed for exposure.  Strap muscles were incised in the midline.  Dissection was begun on  the left side.  Strap muscles were reflected laterally.  Left thyroid lobe was markedly enlarged and multinodular.  It extends mainly into the upper mediastinum inferiorly.  The entire gland was hypertrophied.  Strap muscles were reflected laterally exposing venous tributaries which were divided between medium Ligaclips with the Harmonic scalpel.  With some difficulty, the superior pole was mobilized and superior pole vessels divided individually between medium Ligaclips with the Harmonic scalpel. Superior parathyroid gland was identified and preserved on its vascular pedicle.  Inferior pole was mobilized by dividing venous tributaries. The entire lobe was then mobilized with gentle blunt dissection and rolled anteriorly.  Branches of the inferior thyroid artery were divided between medium Ligaclips with the Harmonic scalpel.  The tissue was swept posteriorly.  The recurrent nerve was never positively identified, but dissection was maintained on the thyroid capsule in order to avoid nerve injury.  Dissection was carried down to the ligament of Frank Sabal which was transected with the electrocautery and the gland was mobilized up and onto the anterior trachea.  Isthmus was mobilized across the midline.  There was no evidence of a pyramidal lobe being present.  Dry packs  were placed in the left neck.  Next, we turned our attention to the right thyroid lobe.  Right thyroid lobe was even larger than the left thyroid lobe and is multinodular. Some of the nodules appeared to be calcified.  On the right side, the superior pole was markedly hypertrophied and extends well cephalad.  The inferior pole does not enter the anterior mediastinum.  The gland was gently mobilized and strap muscles reflected laterally.  Venous tributaries were divided between medium Ligaclips with the Harmonic scalpel.  With some difficulty, the superior pole was mobilized.  Strap muscles were divided slightly to provide  exposure.  The vascular tributaries were divided between medium Ligaclips with the Harmonic scalpel.  Again, a superior parathyroid gland was identified and maintained on its vascular pedicle.  Inferior venous tributaries were divided with the Harmonic scalpel between medium Ligaclips.  Gland was rolled anteriorly.  Again, branches of the inferior thyroid artery were divided between medium Ligaclips with the Harmonic scalpel as the gland was rolled further anteriorly.  Dissection was carried on the surface of the thyroid in hopes of avoiding injury to the recurrent nerve. Recurrent nerve was again not positively identified.  Dissection was carried down to the ligament of Frank Shelton which was transected with electrocautery and the gland was rolled up and onto the anterior trachea from which it was completely excised with the electrocautery.  Both lobes were placed on the back table and representative photographs were taken.  They were then submitted to Pathology for permanent review.  The neck was irrigated with warm saline and good hemostasis was achieved throughout the operative field.  Fibrillar was placed throughout the operative field.  A 7 mm flat Jackson-Pratt type drain was brought in from the left lateral neck stab wound and placed into the thyroid bed bilaterally.  It was secured to the skin with a 3-0 nylon suture and placed to bulb suction.  Strap muscles were reapproximated in the midline with interrupted 3-0 Vicryl sutures.  Platysma was closed with interrupted 3-0 Vicryl sutures.  Skin was closed with a running 4-0 Monocryl subcuticular suture.  Wound was washed and dried and Steri- Strips were applied.  Sterile dressings were applied.  The patient was awakened from anesthesia and brought to the recovery room.  The patient tolerated the procedure well.   Frank Shelton Gentle, MD [facility] Office: [phone]    TMG/MEDQ  D:  03/09/2017  T:  03/09/2017  Job:   [id number]  cc:   Frank NailsGEBRESELASSIE NIDA, MD Frank MachoMark Jenkins, MD

## 2017-03-11 DIAGNOSIS — E042 Nontoxic multinodular goiter: Secondary | ICD-10-CM | POA: Diagnosis not present

## 2017-03-11 LAB — GLUCOSE, CAPILLARY
GLUCOSE-CAPILLARY: 138 mg/dL — AB (ref 65–99)
Glucose-Capillary: 146 mg/dL — ABNORMAL HIGH (ref 65–99)
Glucose-Capillary: 158 mg/dL — ABNORMAL HIGH (ref 65–99)

## 2017-03-11 LAB — BASIC METABOLIC PANEL
ANION GAP: 9 (ref 5–15)
BUN: 12 mg/dL (ref 6–20)
CO2: 26 mmol/L (ref 22–32)
Calcium: 8.5 mg/dL — ABNORMAL LOW (ref 8.9–10.3)
Chloride: 100 mmol/L — ABNORMAL LOW (ref 101–111)
Creatinine, Ser: 0.85 mg/dL (ref 0.61–1.24)
GFR calc Af Amer: >60 mL/min (ref >60)
GLUCOSE: 136 mg/dL — AB (ref 65–99)
POTASSIUM: 3.9 mmol/L (ref 3.5–5.1)
Sodium: 135 mmol/L (ref 135–145)

## 2017-03-11 MED ORDER — CALCIUM CARBONATE ANTACID 500 MG PO CHEW: 2.0000 | CHEWABLE_TABLET | Freq: Four times a day (QID) | ORAL | 1 refills | Status: DC

## 2017-03-11 MED ORDER — HYDROCODONE-ACETAMINOPHEN 5-325 MG PO TABS: 1.0000 | ORAL_TABLET | ORAL | 0 refills | Status: DC | PRN

## 2017-03-11 MED ORDER — LEVOTHYROXINE SODIUM 75 MCG PO TABS: 150.0000 ug | ORAL_TABLET | Freq: Every day | ORAL | Status: DC

## 2017-03-11 NOTE — Discharge Summary (Signed)
Physician Discharge Summary [facility]  Patient ID: Frank Shelton MRN: [id number] DOB/AGE: [birth year] 56 y.o.  Admit date: 03/09/2017 Discharge date: 03/11/2017  Admission Diagnoses:  Thyroid goiter, multiple thyroid nodules  Discharge Diagnoses:  Principal Problem:   Thyroid goiter Active Problems:   Multiple thyroid nodules   Discharged Condition: good  Hospital Course: Patient was admitted for observation following thyroid surgery.  Post op course was uncomplicated.  Pain was well controlled.  Tolerated diet.  Post op calcium level on morning following surgery was 8.3 mg/dl.  IV calcium gluconate was given and supplemental calcium increased to QID.  Patient was prepared for discharge home on POD#2.  Consults: None  Treatments: surgery: total thyroidectomy  Discharge Exam: Blood pressure (!) 111/59, pulse 74, temperature 98.1 F (36.7 C), temperature source Oral, resp. rate 18, weight (!) 176 kg (388 lb), SpO2 97 %. HEENT - clear Neck - wound clear and dry and intact; mild STS; voice normal; drain with serous - removed Chest - clear bilaterally Cor - RRR  Disposition: Home  Discharge Instructions    Diet - low sodium heart healthy    Complete by:  As directed    Discharge instructions    Complete by:  As directed    [facility]  THYROID & PARATHYROID SURGERY:  POST-OP INSTRUCTIONS  Always review your discharge instruction sheet from the facility where your surgery was performed.  A prescription for pain medication may be given to you upon discharge.  Take your pain medication as prescribed.  If narcotic pain medicine is not needed, then you may take acetaminophen (Tylenol) or ibuprofen (Advil) as needed.  Take your usually prescribed medications unless otherwise directed.  If you need a refill on your pain medication, please contact our office during regular business hours.  Prescriptions will not be processed by our office  after 5 pm or on weekends.  Start with a light diet upon arrival home, such as soup and crackers or toast.  Be sure to drink plenty of fluids daily.  Resume your normal diet the day after surgery.  Most patients will experience some swelling and bruising on the chest and neck area.  Ice packs will help.  Swelling and bruising can take several days to resolve.   It is common to experience some constipation after surgery.  Increasing fluid intake and taking a stool softener (Colace) will usually help or prevent this problem.  A mild laxative (Milk of Magnesia or Miralax) should be taken according to package directions if there has been no bowel movement after 48 hours.  You have steri-strips and a gauze dressing over your incision.  You may remove the gauze bandage on the second day after surgery, and you may shower at that time.  Leave your steri-strips (small skin tapes) in place directly over the incision.  These strips should remain on the skin for 5-7 days and then be removed.  You may get them wet in the shower and pat them dry.  You may resume regular (light) daily activities beginning the next day - such as daily self-care, walking, climbing stairs - gradually increasing activities as tolerated.  You may have sexual intercourse when it is comfortable.  Refrain from any heavy lifting or straining until approved by your doctor.  You may drive when you no longer are taking prescription pain medication, you can comfortably wear a seatbelt, and you can safely maneuver your car and apply brakes.  You should see  your doctor in the office for a follow-up appointment approximately three weeks after your surgery.  Make sure that you call for this appointment within a day or two after you arrive home to insure a convenient appointment time.  WHEN TO CALL YOUR DOCTOR: -- Fever greater than 101.5 -- Inability to urinate -- Nausea and/or vomiting - persistent -- Extreme swelling or bruising -- Continued  bleeding from incision -- Increased pain, redness, or drainage from the incision -- Difficulty swallowing or breathing -- Muscle cramping or spasms -- Numbness or tingling in hands or around lips  The clinic staff is available to answer your questions during regular business hours.  Please don't hesitate to call and ask to speak to one of the nurses if you have concerns.  Velora Hecklerodd M. Dottie Vaquerano, MD, FACS General & Endocrine Surgery [facility] Office: [phone]  Website: [email or web address]   Ice pack    Complete by:  As directed    Increase activity slowly    Complete by:  As directed    Remove dressing in 24 hours    Complete by:  As directed      Allergies as of 03/11/2017      Reactions   Nabumetone Hives      Medication List    TAKE these medications   acetaminophen 325 MG tablet Commonly known as:  TYLENOL Take 650 mg by mouth every 6 (six) hours as needed for moderate pain or headache.   allopurinol 300 MG tablet Commonly known as:  ZYLOPRIM Take 300 mg by mouth daily.   B-12 5000 MCG Caps Take 5,000 mcg by mouth daily.   budesonide-formoterol 160-4.5 MCG/ACT inhaler Commonly known as:  SYMBICORT Inhale 2 puffs into the lungs 2 (two) times daily as needed (for shortness of breath).   calcium carbonate 500 MG chewable tablet Commonly known as:  TUMS Chew 2 tablets (400 mg of elemental calcium total) by mouth 4 (four) times daily.   calcium-vitamin D 500-200 MG-UNIT tablet Commonly known as:  OSCAL 500/200 D-3 Take 1 tablet by mouth 2 (two) times daily.   cetirizine 10 MG tablet Commonly known as:  ZYRTEC Take 10 mg by mouth daily as needed for allergies.   colchicine 0.6 MG tablet Take 0.6 mg by mouth daily as needed (for gout flare/pain).   furosemide 20 MG tablet Commonly known as:  LASIX Take 20 mg by mouth daily as needed.   gabapentin 300 MG capsule Commonly known as:  NEURONTIN Take 600 mg by mouth 3 (three) times  daily.   HYDROcodone-acetaminophen 5-325 MG tablet Commonly known as:  NORCO/VICODIN Take 1-2 tablets by mouth every 4 (four) hours as needed for moderate pain.   ibuprofen 800 MG tablet Commonly known as:  ADVIL,MOTRIN Take 800 mg by mouth every 8 (eight) hours as needed for headache or moderate pain.   isosorbide mononitrate 30 MG 24 hr tablet Commonly known as:  IMDUR TAKE 1 TABLET EVERY DAY What changed:  See the new instructions.   levothyroxine 150 MCG tablet Commonly known as:  SYNTHROID, LEVOTHROID Take 1 tablet (150 mcg total) by mouth daily.   metFORMIN 500 MG 24 hr tablet Commonly known as:  GLUCOPHAGE-XR Take 500 mg by mouth 2 (two) times daily.   multivitamin with minerals Tabs tablet Take 1 tablet by mouth daily.   nebivolol 5 MG tablet Commonly known as:  BYSTOLIC Take 5 mg by mouth daily.   OVER THE COUNTER MEDICATION Take 1 capsule by mouth daily.  Hydroxycut Supplement   potassium chloride 10 MEQ tablet Commonly known as:  K-DUR,KLOR-CON Take 10 mEq by mouth 2 (two) times daily.   Vitamin D3 5000 units Caps Take 5,000 Units by mouth 2 (two) times daily.   XARELTO 20 MG Tabs tablet Generic drug:  rivaroxaban TAKE ONE TABLET BY MOUTH DAILY What changed:  See the new instructions.        Velora Heckler, MD, [facility] Office: [phone]   Signed: Velora Heckler 03/11/2017, 11:11 AM

## 2017-03-14 ENCOUNTER — Ambulatory Visit: Payer: Medicare PPO | Admitting: Vascular Surgery

## 2017-03-14 NOTE — Progress Notes (Signed)
Please contact patient and notify of benign pathology results.  Jarquavious Fentress M. Candler Ginsberg, MD, FACS [facility] Office: [phone]   

## 2017-04-11 ENCOUNTER — Encounter: Payer: Self-pay | Admitting: Vascular Surgery

## 2017-04-11 ENCOUNTER — Ambulatory Visit: Payer: Medicare PPO | Admitting: Vascular Surgery

## 2017-04-11 VITALS — BP 134/77 | HR 82 | Temp 98.0°F | Resp 18 | Ht 76.0 in | Wt 395.0 lb

## 2017-04-11 DIAGNOSIS — I83893 Varicose veins of bilateral lower extremities with other complications: Secondary | ICD-10-CM | POA: Diagnosis not present

## 2017-04-11 NOTE — Progress Notes (Signed)
[facility] [place]  Patient name: Frank Shelton [place] MRN: [id number] DOB: 08-16-1960 Sex: male  REASON FOR VISIT: Follow-up severe bilateral lower extremity venous hypertension  HPI: Frank Shelton Pidcock is a 56 y.o. male here today for follow-up.  He is extremely compliant with her graduated compression garments and has been for years.  He saw Dr. Myra GianottiBrabham in the office 3 months ago and it was changed from knee-high or thigh-high compression and has been wearing these as well.  Spite this he continues to have swelling and discomfort in both lower extremities and is developed a new ulcer aspect of his right distal calf above his ankle.  Past Medical History:  Diagnosis Date  . Arthritis    L shoulder & fingers   . Bronchitis    during enviromental changes   . Complication of anesthesia    Report of airway problem  . Diabetes mellitus without complication (HCC)   . Dyspnea   . Embolism - blood clot   . Gout   . Headache    uses ibuprofen & tylenol & its relieved   . Hypertension   . Hypothyroidism   . Myocardial infarction (HCC) 2012  . OSA on CPAP   . Peripheral vascular disease (HCC)   . Pulmonary embolism (HCC)   . Stroke [facility](HCC) 2014    Family History  Problem Relation Age of Onset  . Heart attack Sister 30       Two stents  . Heart attack Mother   . Heart attack Maternal Grandmother   . Heart attack Maternal Grandfather     SOCIAL HISTORY: Social History   Tobacco Use  . Smoking status: Former Smoker    Packs/day: 1.00    Years: 30.00    Pack years: 30.00    Start date: 01/30/1970    Last attempt to quit: 01/30/2006    Years since quitting: 11.2  . Smokeless tobacco: Former NeurosurgeonUser  . Tobacco comment: 9/17 On and off since age 56- AJ  Substance Use Topics  . Alcohol use: Yes    Alcohol/week: 0.0 oz    Comment: occasionally- sometimes goes 3 months without anything    Allergies  Allergen Reactions  .  Nabumetone Hives    Current Outpatient Medications  Medication Sig Dispense Refill  . acetaminophen (TYLENOL) 325 MG tablet Take 650 mg by mouth every 6 (six) hours as needed for moderate pain or headache.    . allopurinol (ZYLOPRIM) 300 MG tablet Take 300 mg by mouth daily.    . budesonide-formoterol (SYMBICORT) 160-4.5 MCG/ACT inhaler Inhale 2 puffs into the lungs 2 (two) times daily as needed (for shortness of breath).     . calcium carbonate (TUMS) 500 MG chewable tablet Chew 2 tablets (400 mg of elemental calcium total) by mouth 4 (four) times daily. 120 tablet 1  . calcium-vitamin D (OSCAL 500/200 D-3) 500-200 MG-UNIT tablet Take 1 tablet by mouth 2 (two) times daily. (Patient not taking: Reported on 03/01/2017) 60 tablet 1  . cetirizine (ZYRTEC) 10 MG tablet Take 10 mg by mouth daily as needed for allergies.    . Cholecalciferol (VITAMIN D3) 5000 units CAPS Take 5,000 Units by mouth 2 (two) times daily.    . colchicine 0.6 MG tablet Take 0.6 mg by mouth daily as needed (for gout flare/pain).     . Cyanocobalamin (B-12) 5000 MCG CAPS Take 5,000 mcg by mouth daily.     . furosemide (LASIX) 20 MG tablet Take  20 mg by mouth daily as needed.     . gabapentin (NEURONTIN) 300 MG capsule Take 600 mg by mouth 3 (three) times daily.     Marland Kitchen. HYDROcodone-acetaminophen (NORCO/VICODIN) 5-325 MG tablet Take 1-2 tablets by mouth every 4 (four) hours as needed for moderate pain. 20 tablet 0  . ibuprofen (ADVIL,MOTRIN) 800 MG tablet Take 800 mg by mouth every 8 (eight) hours as needed for headache or moderate pain.     . isosorbide mononitrate (IMDUR) 30 MG 24 hr tablet TAKE 1 TABLET EVERY DAY (Patient taking differently: Take 30 mg by mouth every day) 90 tablet 3  . levothyroxine (SYNTHROID, LEVOTHROID) 150 MCG tablet Take 1 tablet (150 mcg total) by mouth daily. 30 tablet 2  . metFORMIN (GLUCOPHAGE-XR) 500 MG 24 hr tablet Take 500 mg by mouth 2 (two) times daily.    . Multiple Vitamin (MULTIVITAMIN WITH  MINERALS) TABS tablet Take 1 tablet by mouth daily.    . nebivolol (BYSTOLIC) 5 MG tablet Take 5 mg by mouth daily.    Marland Kitchen. OVER THE COUNTER MEDICATION Take 1 capsule by mouth daily. Hydroxycut Supplement    . potassium chloride (K-DUR,KLOR-CON) 10 MEQ tablet Take 10 mEq by mouth 2 (two) times daily.    Carlena Hurl. XARELTO 20 MG TABS tablet TAKE ONE TABLET BY MOUTH DAILY (Patient taking differently: Take 20 mg by mouth daily) 30 tablet 3   No current facility-administered medications for this visit.     REVIEW OF SYSTEMS:  [X]  denotes positive finding, [ ]  denotes negative finding Cardiac  Comments:  Chest pain or chest pressure:    Shortness of breath upon exertion:    Short of breath when lying flat:    Irregular heart rhythm:        Vascular    Pain in calf, thigh, or hip brought on by ambulation:    Pain in feet at night that wakes you up from your sleep:     Blood clot in your veins:    Leg swelling:  x         PHYSICAL EXAM: Vitals:   04/11/17 1120  BP: 134/77  Pulse: 82  Resp: 18  Temp: 98 F (36.7 C)  SpO2: 97%  Weight: (!) 395 lb (179.2 kg)  Height: 6\' 4"  (1.93 m)    GENERAL: The patient is a well-nourished male, in no acute distress. The vital signs are documented above. CARDIOVASCULAR: Marked changes of venous hypertension bilaterally.  Several scattered varicosities bilaterally. PULMONARY: There is good air exchange  MUSCULOSKELETAL: There are no major deformities or cyanosis. NEUROLOGIC: No focal weakness or paresthesias are detected. SKIN: Superficial ulceration over his distal lateral anterior right ankle.  Extensive circumferential hemosiderin deposits bilaterally from his calf down to his ankles. PSYCHIATRIC: The patient has a normal affect.  DATA:  I reviewed his prior formal venous duplex and reimaged his veins with SonoSite ultrasound.  He has markedly dilated great saphenous vein throughout its course with reflux throughout his great saphenous vein.  He does  have some reflux in the small saphenous vein but minimal dilatation  MEDICAL ISSUES: I explained that he does have deep venous reflux as well but can have improvement in his overall venous hypertension with correction of his marked saphenous vein reflux.  Have recommended staged bilateral laser ablation of his great would recommend right leg first since he is currently with ulceration.  Will schedule this at his earliest convenience    Larina Earthlyodd F. Heath Badon, MD [facility] Tel [phone] Pager [phone]

## 2017-04-13 ENCOUNTER — Ambulatory Visit: Payer: Medicare PPO | Admitting: "Endocrinology

## 2017-04-18 ENCOUNTER — Other Ambulatory Visit: Payer: Self-pay | Admitting: *Deleted

## 2017-04-18 DIAGNOSIS — L97221 Non-pressure chronic ulcer of left calf limited to breakdown of skin: Principal | ICD-10-CM

## 2017-04-18 DIAGNOSIS — I83022 Varicose veins of left lower extremity with ulcer of calf: Secondary | ICD-10-CM

## 2017-04-18 DIAGNOSIS — I83012 Varicose veins of right lower extremity with ulcer of calf: Secondary | ICD-10-CM

## 2017-04-18 DIAGNOSIS — L97211 Non-pressure chronic ulcer of right calf limited to breakdown of skin: Principal | ICD-10-CM

## 2017-04-27 ENCOUNTER — Other Ambulatory Visit: Payer: Self-pay | Admitting: "Endocrinology

## 2017-04-28 LAB — RENAL FUNCTION PANEL
Albumin: 4 g/dL (ref 3.5–5.5)
BUN/Creatinine Ratio: 18 (ref 9–20)
BUN: 16 mg/dL (ref 6–24)
CALCIUM: 9.4 mg/dL (ref 8.7–10.2)
CO2: 24 mmol/L (ref 20–29)
Chloride: 101 mmol/L (ref 96–106)
Creatinine, Ser: 0.9 mg/dL (ref 0.76–1.27)
GFR, EST AFRICAN AMERICAN: 108 mL/min/{1.73_m2} (ref >59)
GFR, EST NON AFRICAN AMERICAN: 94 mL/min/{1.73_m2} (ref >59)
Glucose: 184 mg/dL — ABNORMAL HIGH (ref 65–99)
PHOSPHORUS: 2.9 mg/dL (ref 2.5–4.5)
Potassium: 4.6 mmol/L (ref 3.5–5.2)
Sodium: 138 mmol/L (ref 134–144)

## 2017-04-28 LAB — T4, FREE: Free T4: 0.78 ng/dL — ABNORMAL LOW (ref 0.82–1.77)

## 2017-04-28 LAB — HGB A1C W/O EAG: HEMOGLOBIN A1C: 8.2 % — AB (ref 4.8–5.6)

## 2017-04-28 LAB — VITAMIN D 25 HYDROXY (VIT D DEFICIENCY, FRACTURES): VIT D 25 HYDROXY: 42.2 ng/mL (ref 30.0–100.0)

## 2017-04-28 LAB — MICROALBUMIN / CREATININE URINE RATIO
Creatinine, Urine: 110.9 mg/dL
MICROALB/CREAT RATIO: 136.4 mg/g{creat} — AB (ref 0.0–30.0)
MICROALBUM., U, RANDOM: 151.3 ug/mL

## 2017-04-28 LAB — TSH: TSH: 50.74 u[IU]/mL — AB (ref 0.450–4.500)

## 2017-05-04 ENCOUNTER — Encounter: Payer: Self-pay | Admitting: "Endocrinology

## 2017-05-04 ENCOUNTER — Ambulatory Visit (INDEPENDENT_AMBULATORY_CARE_PROVIDER_SITE_OTHER): Payer: Medicare PPO | Admitting: "Endocrinology

## 2017-05-04 VITALS — BP 148/92 | HR 65 | Ht 76.0 in | Wt >= 6400 oz

## 2017-05-04 DIAGNOSIS — E890 Postprocedural hypothyroidism: Secondary | ICD-10-CM

## 2017-05-04 DIAGNOSIS — E039 Hypothyroidism, unspecified: Secondary | ICD-10-CM | POA: Insufficient documentation

## 2017-05-04 DIAGNOSIS — E1159 Type 2 diabetes mellitus with other circulatory complications: Secondary | ICD-10-CM | POA: Diagnosis not present

## 2017-05-04 MED ORDER — LEVOTHYROXINE SODIUM 200 MCG PO TABS: 200.0000 ug | ORAL_TABLET | Freq: Every day | ORAL | 6 refills | Status: DC

## 2017-05-04 NOTE — Patient Instructions (Signed)

## 2017-05-04 NOTE — Progress Notes (Signed)
Subjective:    Patient ID: Frank Shelton, male    DOB: 12/13/1960,  Past Medical History:  Diagnosis Date  . Arthritis    L shoulder & fingers   . Bronchitis    during enviromental changes   . Complication of anesthesia    Report of airway problem  . Diabetes mellitus without complication (HCC)   . Dyspnea   . Embolism - blood clot   . Gout   . Headache    uses ibuprofen & tylenol & its relieved   . Hypertension   . Hypothyroidism   . Myocardial infarction (HCC) 2012  . OSA on CPAP   . Peripheral vascular disease (HCC)   . Pulmonary embolism (HCC)   . Stroke [facility](HCC) 2014   Past Surgical History:  Procedure Laterality Date  . HELLER MYOTOMY  2014   [facility]  . IVC FILTER INSERTION  2010  . ROTATOR CUFF REPAIR Right   . SKIN GRAFT     x3- to face., child   . THYROIDECTOMY N/A 03/09/2017   Procedure: TOTAL THYROIDECTOMY;  Surgeon: Darnell LevelGerkin, Todd, MD;  Location: [facility] OR;  Service: General;  Laterality: N/A;  . TOTAL THYROIDECTOMY  03/09/2017   Social History   Socioeconomic History  . Marital status: Divorced    Spouse name: None  . Number of children: None  . Years of education: None  . Highest education level: None  Social Needs  . Financial resource strain: None  . Food insecurity - worry: None  . Food insecurity - inability: None  . Transportation needs - medical: None  . Transportation needs - non-medical: None  Occupational History  . None  Tobacco Use  . Smoking status: Former Smoker    Packs/day: 1.00    Years: 30.00    Pack years: 30.00    Start date: 01/30/1970    Last attempt to quit: 01/30/2006    Years since quitting: 11.2  . Smokeless tobacco: Former NeurosurgeonUser  . Tobacco comment: 9/17 On and off since age 56- AJ  Substance and Sexual Activity  . Alcohol use: Yes    Alcohol/week: 0.0 oz    Comment: occasionally- sometimes goes 3 months without anything  . Drug use: No  . Sexual activity: Yes    Birth control/protection: None  Other Topics  Concern  . None  Social History Narrative  . None   Outpatient Encounter Medications as of 05/04/2017  Medication Sig  . acetaminophen (TYLENOL) 325 MG tablet Take 650 mg by mouth every 6 (six) hours as needed for moderate pain or headache.  . allopurinol (ZYLOPRIM) 300 MG tablet Take 300 mg by mouth daily.  . budesonide-formoterol (SYMBICORT) 160-4.5 MCG/ACT inhaler Inhale 2 puffs into the lungs 2 (two) times daily as needed (for shortness of breath).   . calcium carbonate (TUMS) 500 MG chewable tablet Chew 2 tablets (400 mg of elemental calcium total) by mouth 4 (four) times daily.  . calcium-vitamin D (OSCAL 500/200 D-3) 500-200 MG-UNIT tablet Take 1 tablet by mouth 2 (two) times daily. (Patient not taking: Reported on 03/01/2017)  . cetirizine (ZYRTEC) 10 MG tablet Take 10 mg by mouth daily as needed for allergies.  . Cholecalciferol (VITAMIN D3) 5000 units CAPS Take 5,000 Units by mouth 2 (two) times daily.  . colchicine 0.6 MG tablet Take 0.6 mg by mouth daily as needed (for gout flare/pain).   . Cyanocobalamin (B-12) 5000 MCG CAPS Take 5,000 mcg by mouth daily.   . furosemide (  LASIX) 20 MG tablet Take 20 mg by mouth daily as needed.   . gabapentin (NEURONTIN) 300 MG capsule Take 600 mg by mouth 3 (three) times daily.   Marland Kitchen. HYDROcodone-acetaminophen (NORCO/VICODIN) 5-325 MG tablet Take 1-2 tablets by mouth every 4 (four) hours as needed for moderate pain.  Marland Kitchen. ibuprofen (ADVIL,MOTRIN) 800 MG tablet Take 800 mg by mouth every 8 (eight) hours as needed for headache or moderate pain.   . isosorbide mononitrate (IMDUR) 30 MG 24 hr tablet TAKE 1 TABLET EVERY DAY (Patient taking differently: Take 30 mg by mouth every day)  . levothyroxine (SYNTHROID, LEVOTHROID) 200 MCG tablet Take 1 tablet (200 mcg total) by mouth daily.  . metFORMIN (GLUCOPHAGE-XR) 500 MG 24 hr tablet Take 500 mg by mouth 2 (two) times daily.  . Multiple Vitamin (MULTIVITAMIN WITH MINERALS) TABS tablet Take 1 tablet by mouth  daily.  . nebivolol (BYSTOLIC) 5 MG tablet Take 5 mg by mouth daily.  Marland Kitchen. OVER THE COUNTER MEDICATION Take 1 capsule by mouth daily. Hydroxycut Supplement  . potassium chloride (K-DUR,KLOR-CON) 10 MEQ tablet Take 10 mEq by mouth 2 (two) times daily.  Carlena Hurl. XARELTO 20 MG TABS tablet TAKE ONE TABLET BY MOUTH DAILY (Patient taking differently: Take 20 mg by mouth daily)  . [DISCONTINUED] levothyroxine (SYNTHROID, LEVOTHROID) 150 MCG tablet Take 1 tablet (150 mcg total) by mouth daily.   No facility-administered encounter medications on file as of 05/04/2017.    ALLERGIES: Allergies  Allergen Reactions  . Nabumetone Hives    VACCINATION STATUS: Immunization History  Administered Date(s) Administered  . Influenza Whole 01/14/2010  . Influenza,inj,Quad PF,6+ Mos 03/10/2017  . Pneumococcal Polysaccharide-23 01/14/2010    HPI Frank KernCurtis R Peerson is 56 y.o. male who presents today with a medical history as above. he is being seen in f/u for after recent total thyroidectomy for long-standing large multinodular goiter with benign findings.  - He did have diagnosis of hypothyroidism for which he was taking levothyroxine for the last 18 years. After his surgery the dose of his levothyroxine was increased to 150 g by mouth every morning. He reports compliance to this medication.  - He feels relieved off of neck pressure he had before surgery.  - He reports he is sleeping better and has better energy.     he has been dealing with symptoms of  dysphagia, choking,  shortness of breath intermittently for several years- which are largely subsided.  -He denies family history of thyroid dysfunction, no family history of thyroid cancer. - He has history of type 2 diabetes diagnosed at approximate age of 48 years, complicated by coronary artery disease, stroke, obesity for most of his adult life. - His labs show A1c of 8.2% increasing from  7.5%, he is on metformin 500 mg by mouth twice a day. - He complains of  inability to lose weight.  Review of Systems  Constitutional: + Overweight/obese most of his adult life, + fatigue, no subjective hyperthermia, no subjective hypothermia Eyes: no blurry vision, no xerophthalmia ENT: no sore throat, + nodules palpated in throat, + dysphagia, +odynophagia, no hoarseness Cardiovascular: no Chest Pain, no Shortness of Breath, no palpitations, no leg swelling Respiratory: + cough, no SOB Gastrointestinal: no Nausea/Vomiting/Diarhhea Musculoskeletal: no muscle/joint aches Skin: no rashes Neurological: no tremors, no numbness, no tingling, no dizziness Psychiatric: no depression, no anxiety  Objective:    BP (!) 148/92   Pulse 65   Ht 6\' 4"  (1.93 m)   Wt (!) 400 lb (181.4 kg)  BMI 48.69 kg/m   Wt Readings from Last 3 Encounters:  05/04/17 (!) 400 lb (181.4 kg)  04/11/17 (!) 395 lb (179.2 kg)  03/09/17 (!) 388 lb (176 kg)    Physical Exam  Constitutional: + obese, not in acute distress, normal state of mind Eyes: PERRLA, EOMI, no exophthalmos ENT: moist mucous membranes, + healing post thyroidectomy scar on anterior lower neck , no cervical lymphadenopathy Cardiovascular: normal precordial activity,  +  distant heart sounds  , no Murmur/Rubs/Gallops Respiratory:  adequate breathing efforts, no gross chest deformity, Clear to auscultation bilaterally Gastrointestinal: +abdomen is obese, soft, Non -tender, No distension, Bowel Sounds present Musculoskeletal: strength intact in all four extremities Skin: +dry skin, warm, no rashes, + skin signs of chronic venous insufficiency on bilateral lower extremities which are large and non- pitting. Pulses are 0 on dorsalis pedis and posterior tibial arteries on bilateral lower extremities.  Neurological: no tremor with outstretched hands, Deep tendon reflexes normal in all four extremities.  CMP ( most recent) CMP     Component Value Date/Time   NA 138 04/27/2017 1539   K 4.6 04/27/2017 1539   CL 101  04/27/2017 1539   CO2 24 04/27/2017 1539   GLUCOSE 184 (H) 04/27/2017 1539   GLUCOSE 136 (H) 03/11/2017 0710   BUN 16 04/27/2017 1539   CREATININE 0.90 04/27/2017 1539   CREATININE 0.84 01/10/2017 1007   CALCIUM 9.4 04/27/2017 1539   PROT 7.1 03/07/2017 1043   ALBUMIN 4.0 04/27/2017 1539   AST 31 03/07/2017 1043   ALT 27 03/07/2017 1043   ALKPHOS 66 03/07/2017 1043   BILITOT 0.9 03/07/2017 1043   GFRNONAA 94 04/27/2017 1539   GFRAA 108 04/27/2017 1539     Diabetic Labs (most recent): Lab Results  Component Value Date   HGBA1C 8.2 (H) 04/27/2017   HGBA1C 7.5 (H) 01/10/2017   HGBA1C 7.2 (H) 01/10/2014     Lipid Panel ( most recent) Lipid Panel     Component Value Date/Time   CHOL 91 01/10/2014 0443   TRIG 193 (H) 01/10/2014 0443   HDL 25 (L) 01/10/2014 0443   CHOLHDL 3.6 01/10/2014 0443   VLDL 39 01/10/2014 0443   LDLCALC 27 01/10/2014 0443    Results for ATOM, SOLIVAN (MRN [id number]) as of 05/04/2017 17:54  Ref. Range 04/27/2017 15:39  Glucose Latest Ref Range: 65 - 99 mg/dL 409 (H)  Hemoglobin W1X Latest Ref Range: 4.8 - 5.6 % 8.2 (H)  TSH Latest Ref Range: 0.450 - 4.500 uIU/mL 50.740 (H)  T4,Free(Direct) Latest Ref Range: 0.82 - 1.77 ng/dL 9.14 (L)    Assessment & Plan:   1. Postsurgical Hypothyroidism - He is status post total thyroidectomy for long-standing large multinodular goiter with benign outcomes. He is recovering from his surgery very well.  - Based on his recent thyroid function tests and his body size, he would benefit from a higher dose of levothyroxine.  - I will increase his levothyroxine to 200 g by mouth every morning.   - We discussed about correct intake of levothyroxine, at fasting, with water, separated by at least 30 minutes from breakfast, and separated by more than 4 hours from calcium, iron, multivitamins, acid reflux medications (PPIs). -Patient is made aware of the fact that thyroid hormone replacement is needed for life, dose to be  adjusted by periodic monitoring of thyroid function tests.  2. Morbid obesity (HCC) with type 2 diabetes -  Complicated by Type 2 diabetes since at least age 25,  currently on metformin ER 500 mg by mouth twice a day. His A1c is 8.2% increasing from  7.5%.  I advised him to continue metformin ER  500 mg by mouth twice a day.  Suggestion is made for him to avoid simple carbohydrates  from his diet including Cakes, Sweet Desserts, Ice Cream, Soda (diet and regular), Sweet Tea, Candies, Chips, Cookies, Store Bought Juices, Alcohol in Excess of  1-2 drinks a day, Artificial Sweeteners, and "Sugar-free" Products. This will help patient to have stable blood glucose profile and potentially avoid unintended weight gain. - Will be considered for weekly incretin therapy if he does not achieve control of diabetes by next visit.  - We set a goal of 10% of his body weight loss which is approximately 40 pounds in 1-2 years time to impact his type 2 diabetes, hypertension, sleep apnea.   3. Goiter- resolved status post total thyroidectomy.  - I advised patient to maintain close follow up with his cardiologist and his PCP for primary care needs. Follow up plan: Return in about 3 months (around 08/02/2017) for follow up with pre-visit labs.  Marquis Lunch, MD [facility] Phone: [phone]  Fax: [phone]   05/04/2017, 5:49 PM This note was partially dictated with voice recognition software. Similar sounding words can be transcribed inadequately or may not  be corrected upon review.

## 2017-06-01 ENCOUNTER — Encounter: Payer: Self-pay | Admitting: Vascular Surgery

## 2017-06-01 ENCOUNTER — Ambulatory Visit: Payer: Medicare PPO | Admitting: Vascular Surgery

## 2017-06-01 VITALS — BP 138/79 | HR 80 | Temp 98.0°F | Resp 18 | Ht 76.0 in | Wt 382.0 lb

## 2017-06-01 DIAGNOSIS — I83893 Varicose veins of bilateral lower extremities with other complications: Secondary | ICD-10-CM | POA: Diagnosis not present

## 2017-06-01 NOTE — Progress Notes (Signed)
     Laser Ablation Procedure    Date: 06/01/2017   Frank Shelton R Charnley DOB:07-18-1960  Consent signed: Yes    Surgeon:  Dr. Tawanna Coolerodd Sumiye Hirth  Procedure: Laser Ablation: right Greater Saphenous Vein  BP 138/79   Pulse 80   Temp 98 F (36.7 C)   Resp 18   Ht 6\' 4"  (1.93 m)   Wt (!) 382 lb (173.3 kg)   SpO2 98%   BMI 46.50 kg/m   Tumescent Anesthesia: 500 cc 0.9% NaCl with 50 cc Lidocaine HCL with 1% Epi and 15 cc 8.4% NaHCO3  Local Anesthesia: 4 cc Lidocaine HCL and NaHCO3 (ratio 2:1)  17 watts continuous mode        Total energy: 2579   Total time: 2:51    Patient tolerated procedure well  Notes:   Description of Procedure:  After marking the course of the secondary varicosities, the patient was placed on the operating table in the supine position, and the right leg was prepped and draped in sterile fashion.   Local anesthetic was administered and under ultrasound guidance the saphenous vein was accessed with a micro needle and guide wire; then the mirco puncture sheath was placed.  A guide wire was inserted saphenofemoral junction , followed by a 5 french sheath.  The position of the sheath and then the laser fiber below the junction was confirmed using the ultrasound.  Tumescent anesthesia was administered along the course of the saphenous vein using ultrasound guidance. The patient was placed in Trendelenburg position and protective laser glasses were placed on patient and staff, and the laser was fired at 15 watts continuous mode advancing 1-442mm/second for a total of 2579 joules.     Steri strips were applied to the stab wounds and ABD pads and thigh high compression stockings were applied.  Ace wrap bandages were applied over the phlebectomy sites and at the top of the saphenofemoral junction. Blood loss was less than 15 cc.  The patient ambulated out of the operating room having tolerated the procedure well.  Uneventful ablation from mid calf to just below the saphenofemoral  junction.  He did have a very dilated vein therefore we used 17 W continuous mode.  Will be seen by Dr. Hart RochesterLawson next week and.  Tentatively scheduled for left leg treatment on June 15, 2017

## 2017-06-06 ENCOUNTER — Ambulatory Visit (HOSPITAL_COMMUNITY)
Admission: RE | Admit: 2017-06-06 | Discharge: 2017-06-06 | Disposition: A | Discharge status: Home / Self Care | Payer: Medicare PPO | Source: Ambulatory Visit | Attending: Vascular Surgery | Admitting: Vascular Surgery

## 2017-06-06 ENCOUNTER — Encounter: Payer: Self-pay | Admitting: Vascular Surgery

## 2017-06-06 ENCOUNTER — Ambulatory Visit (INDEPENDENT_AMBULATORY_CARE_PROVIDER_SITE_OTHER): Payer: Medicare PPO | Admitting: Vascular Surgery

## 2017-06-06 VITALS — BP 141/79 | HR 75 | Temp 97.0°F | Resp 20 | Ht 76.0 in | Wt 382.0 lb

## 2017-06-06 DIAGNOSIS — I83012 Varicose veins of right lower extremity with ulcer of calf: Secondary | ICD-10-CM | POA: Diagnosis present

## 2017-06-06 DIAGNOSIS — L97211 Non-pressure chronic ulcer of right calf limited to breakdown of skin: Secondary | ICD-10-CM | POA: Diagnosis present

## 2017-06-06 DIAGNOSIS — I83893 Varicose veins of bilateral lower extremities with other complications: Secondary | ICD-10-CM

## 2017-06-06 NOTE — Progress Notes (Signed)
Subjective:     Patient ID: Frank Shelton, male   DOB: 1961/01/05, 57 y.o.   MRN: [id number]  HPI This 57 year old male returns 1 week post-laser ablation right great saphenous vein by Dr. early for gross reflux with history of venous ulcer right leg. He has worn his elastic compression stocking and taken ibuprofen as instructed. He has had multiple ulcers in the right leg in the past which she has healed on his unknown with compression stockings. He has had a few ulcers on the left side as well.  Past Medical History:  Diagnosis Date  . Arthritis    L shoulder & fingers   . Bronchitis    during enviromental changes   . Complication of anesthesia    Report of airway problem  . Diabetes mellitus without complication (HCC)   . Dyspnea   . Embolism - blood clot   . Gout   . Headache    uses ibuprofen & tylenol & its relieved   . Hypertension   . Hypothyroidism   . Myocardial infarction (HCC) 2012  . OSA on CPAP   . Peripheral vascular disease (HCC)   . Pulmonary embolism (HCC)   . Stroke [facility](HCC) 2014    Social History   Tobacco Use  . Smoking status: Former Smoker    Packs/day: 1.00    Years: 30.00    Pack years: 30.00    Start date: 01/30/1970    Last attempt to quit: 01/30/2006    Years since quitting: 11.3  . Smokeless tobacco: Former NeurosurgeonUser  . Tobacco comment: 9/17 On and off since age 57- AJ  Substance Use Topics  . Alcohol use: Yes    Alcohol/week: 0.0 oz    Comment: occasionally- sometimes goes 3 months without anything    Family History  Problem Relation Age of Onset  . Heart attack Sister 30       Two stents  . Heart attack Mother   . Heart attack Maternal Grandmother   . Heart attack Maternal Grandfather     Allergies  Allergen Reactions  . Nabumetone Hives     Current Outpatient Medications:  .  acetaminophen (TYLENOL) 325 MG tablet, Take 650 mg by mouth every 6 (six) hours as needed for moderate pain or headache., Disp: , Rfl:  .  allopurinol  (ZYLOPRIM) 300 MG tablet, Take 300 mg by mouth daily., Disp: , Rfl:  .  budesonide-formoterol (SYMBICORT) 160-4.5 MCG/ACT inhaler, Inhale 2 puffs into the lungs 2 (two) times daily as needed (for shortness of breath). , Disp: , Rfl:  .  calcium carbonate (TUMS) 500 MG chewable tablet, Chew 2 tablets (400 mg of elemental calcium total) by mouth 4 (four) times daily., Disp: 120 tablet, Rfl: 1 .  cetirizine (ZYRTEC) 10 MG tablet, Take 10 mg by mouth daily as needed for allergies., Disp: , Rfl:  .  Cholecalciferol (VITAMIN D3) 5000 units CAPS, Take 5,000 Units by mouth 2 (two) times daily., Disp: , Rfl:  .  colchicine 0.6 MG tablet, Take 0.6 mg by mouth daily as needed (for gout flare/pain). , Disp: , Rfl:  .  Cyanocobalamin (B-12) 5000 MCG CAPS, Take 5,000 mcg by mouth daily. , Disp: , Rfl:  .  furosemide (LASIX) 20 MG tablet, Take 20 mg by mouth daily as needed. , Disp: , Rfl:  .  gabapentin (NEURONTIN) 300 MG capsule, Take 600 mg by mouth 3 (three) times daily. , Disp: , Rfl:  .  HYDROcodone-acetaminophen (NORCO/VICODIN) 5-325 MG  tablet, Take 1-2 tablets by mouth every 4 (four) hours as needed for moderate pain., Disp: 20 tablet, Rfl: 0 .  ibuprofen (ADVIL,MOTRIN) 800 MG tablet, Take 800 mg by mouth every 8 (eight) hours as needed for headache or moderate pain. , Disp: , Rfl:  .  isosorbide mononitrate (IMDUR) 30 MG 24 hr tablet, TAKE 1 TABLET EVERY DAY (Patient taking differently: Take 30 mg by mouth every day), Disp: 90 tablet, Rfl: 3 .  levothyroxine (SYNTHROID, LEVOTHROID) 200 MCG tablet, Take 1 tablet (200 mcg total) by mouth daily., Disp: 30 tablet, Rfl: 6 .  metFORMIN (GLUCOPHAGE-XR) 500 MG 24 hr tablet, Take 500 mg by mouth 2 (two) times daily., Disp: , Rfl:  .  Multiple Vitamin (MULTIVITAMIN WITH MINERALS) TABS tablet, Take 1 tablet by mouth daily., Disp: , Rfl:  .  nebivolol (BYSTOLIC) 5 MG tablet, Take 5 mg by mouth daily., Disp: , Rfl:  .  OVER THE COUNTER MEDICATION, Take 1 capsule by  mouth daily. Hydroxycut Supplement, Disp: , Rfl:  .  potassium chloride (K-DUR,KLOR-CON) 10 MEQ tablet, Take 10 mEq by mouth 2 (two) times daily., Disp: , Rfl:  .  XARELTO 20 MG TABS tablet, TAKE ONE TABLET BY MOUTH DAILY (Patient taking differently: Take 20 mg by mouth daily), Disp: 30 tablet, Rfl: 3 .  calcium-vitamin D (OSCAL 500/200 D-3) 500-200 MG-UNIT tablet, Take 1 tablet by mouth 2 (two) times daily. (Patient not taking: Reported on 03/01/2017), Disp: 60 tablet, Rfl: 1  Vitals:   06/06/17 1540  BP: (!) 141/79  Pulse: 75  Resp: 20  Temp: (!) 97 F (36.1 C)  TempSrc: Oral  SpO2: 98%  Weight: (!) 382 lb (173.3 kg)  Height: 6\' 4"  (1.93 m)    Body mass index is 46.5 kg/m.         Review of Systems Denies chest pain but does have mild dyspnea on exertion with chronic obesity. No hemoptysis or claudication    Objective:   Physical Exam BP (!) 141/79 (BP Location: Right Leg, Patient Position: Bed low/side rails up, Cuff Size: Large)   Pulse 75   Temp (!) 97 F (36.1 C) (Oral)   Resp 20   Ht 6\' 4"  (1.93 m)   Wt (!) 382 lb (173.3 kg)   SpO2 98%   BMI 46.50 kg/m   General morbidly obese male no apparent distress alert and oriented 3 Lungs no rhonchi or wheezing Cardiovascular regular rhythm no murmurs Right leg with mild discomfort to deep palpation over great saphenous vein from the knee to the inguinal crease. Chronic hyperpigmentation lower half of right leg with chronic 1+ edema and evidence of healed ulcer laterally but no active ulcer.  Today I ordered a venous duplex exam the right leg which I reviewed and interpreted. There is no DVT. 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 early

## 2017-06-15 ENCOUNTER — Ambulatory Visit (INDEPENDENT_AMBULATORY_CARE_PROVIDER_SITE_OTHER): Payer: Medicare PPO | Admitting: Vascular Surgery

## 2017-06-15 ENCOUNTER — Encounter: Payer: Self-pay | Admitting: Vascular Surgery

## 2017-06-15 VITALS — BP 143/81 | HR 70 | Temp 98.0°F | Resp 18 | Ht 76.0 in | Wt 382.0 lb

## 2017-06-15 DIAGNOSIS — I83893 Varicose veins of bilateral lower extremities with other complications: Secondary | ICD-10-CM

## 2017-06-15 NOTE — Progress Notes (Signed)
     Laser Ablation Procedure    Date: 06/15/2017   Frank Shelton DOB:1961/04/24  Consent signed: Yes    Surgeon:  Dr. Tawanna Coolerodd Jaylon Boylen  Procedure: Laser Ablation: left Greater Saphenous Vein  BP (!) 143/81   Pulse 70   Temp 98 F (36.7 C)   Resp 18   Ht 6\' 4"  (1.93 m)   Wt (!) 382 lb (173.3 kg)   SpO2 99%   BMI 46.50 kg/m   Tumescent Anesthesia: 500 cc 0.9% NaCl with 50 cc Lidocaine HCL with 1% Epi and 15 cc 8.4% NaHCO3  Local Anesthesia: 7 cc Lidocaine HCL and NaHCO3 (ratio 2:1)  15 watts continuous mode        Total energy: 3404   Total time: 3:19    Patient tolerated procedure well  Notes:   Description of Procedure:  After marking the course of the secondary varicosities, the patient was placed on the operating table in the supine position, and the left leg was prepped and draped in sterile fashion.   Local anesthetic was administered and under ultrasound guidance the saphenous vein was accessed with a micro needle and guide wire; then the mirco puncture sheath was placed.  A guide wire was inserted saphenofemoral junction , followed by a 5 french sheath.  The position of the sheath and then the laser fiber below the junction was confirmed using the ultrasound.  Tumescent anesthesia was administered along the course of the saphenous vein using ultrasound guidance. The patient was placed in Trendelenburg position and protective laser glasses were placed on patient and staff, and the laser was fired at 15 watts continuous mode advancing 1-832mm/second for a total of 3404 joules.     Steri strips were applied to the stab wounds and ABD pads and thigh high compression stockings were applied.  Ace wrap bandages were applied over the phlebectomy sites and at the top of the saphenofemoral junction. Blood loss was less than 15 cc.  The patient ambulated out of the operating room having tolerated the procedure well.  Uneventful ablation from mid calf to just below the saphenofemoral  junction.  Duplex follow-up in 1 week

## 2017-06-16 ENCOUNTER — Encounter: Payer: Self-pay | Admitting: Vascular Surgery

## 2017-06-22 ENCOUNTER — Ambulatory Visit (HOSPITAL_COMMUNITY)
Admission: RE | Admit: 2017-06-22 | Discharge: 2017-06-22 | Disposition: A | Discharge status: Home / Self Care | Payer: Medicare PPO | Source: Ambulatory Visit | Attending: Vascular Surgery | Admitting: Vascular Surgery

## 2017-06-22 ENCOUNTER — Other Ambulatory Visit: Payer: Self-pay

## 2017-06-22 ENCOUNTER — Encounter: Payer: Self-pay | Admitting: Vascular Surgery

## 2017-06-22 ENCOUNTER — Ambulatory Visit (INDEPENDENT_AMBULATORY_CARE_PROVIDER_SITE_OTHER): Payer: Medicare PPO | Admitting: Vascular Surgery

## 2017-06-22 VITALS — BP 119/74 | HR 69 | Temp 98.6°F | Resp 18 | Ht 76.0 in | Wt 382.0 lb

## 2017-06-22 DIAGNOSIS — I83893 Varicose veins of bilateral lower extremities with other complications: Secondary | ICD-10-CM | POA: Diagnosis not present

## 2017-06-22 DIAGNOSIS — L97221 Non-pressure chronic ulcer of left calf limited to breakdown of skin: Secondary | ICD-10-CM | POA: Diagnosis not present

## 2017-06-22 DIAGNOSIS — I83022 Varicose veins of left lower extremity with ulcer of calf: Secondary | ICD-10-CM | POA: Insufficient documentation

## 2017-06-22 NOTE — Progress Notes (Signed)
[facility] [place]  Patient name: Frank Shelton MRN: [id number] DOB: [birth year] Sex: male  REASON FOR VISIT: Follow-up ablation left great saphenous vein on 06/15/2017  HPI: Frank Shelton is a 57 y.o. male here today for follow-up.  He underwent uneventful ablation of his left great saphenous vein 1 week ago.  He had a similar treatment to his right leg approximately 1 month ago.  He has had more discomfort on this leg than he did on the initial leg.  Reports this is mainly in the level of his medial calf up to his knee.  Actually reports this is better with walking.  He has been very compliant with his thigh-high compression.  Past Medical History:  Diagnosis Date  . Arthritis    L shoulder & fingers   . Bronchitis    during enviromental changes   . Complication of anesthesia    Report of airway problem  . Diabetes mellitus without complication (HCC)   . Dyspnea   . Embolism - blood clot   . Gout   . Headache    uses ibuprofen & tylenol & its relieved   . Hypertension   . Hypothyroidism   . Myocardial infarction (HCC) 2012  . OSA on CPAP   . Peripheral vascular disease (HCC)   . Pulmonary embolism (HCC)   . Stroke [facility]) 2014    Family History  Problem Relation Age of Onset  . Heart attack Sister 30       Two stents  . Heart attack Mother   . Heart attack Maternal Grandmother   . Heart attack Maternal Grandfather     SOCIAL HISTORY: Social History   Tobacco Use  . Smoking status: Former Smoker    Packs/day: 1.00    Years: 30.00    Pack years: 30.00    Start date: 01/30/1970    Last attempt to quit: 01/30/2006    Years since quitting: 11.4  . Smokeless tobacco: Former Neurosurgeon  . Tobacco comment: 9/17 On and off since age 42- AJ  Substance Use Topics  . Alcohol use: Yes    Alcohol/week: 0.0 oz    Comment: occasionally- sometimes goes 3 months without anything    Allergies  Allergen Reactions  .  Nabumetone Hives    Current Outpatient Medications  Medication Sig Dispense Refill  . acetaminophen (TYLENOL) 325 MG tablet Take 650 mg by mouth every 6 (six) hours as needed for moderate pain or headache.    . allopurinol (ZYLOPRIM) 300 MG tablet Take 300 mg by mouth daily.    . budesonide-formoterol (SYMBICORT) 160-4.5 MCG/ACT inhaler Inhale 2 puffs into the lungs 2 (two) times daily as needed (for shortness of breath).     . calcium carbonate (TUMS) 500 MG chewable tablet Chew 2 tablets (400 mg of elemental calcium total) by mouth 4 (four) times daily. 120 tablet 1  . calcium-vitamin D (OSCAL 500/200 D-3) 500-200 MG-UNIT tablet Take 1 tablet by mouth 2 (two) times daily. 60 tablet 1  . cetirizine (ZYRTEC) 10 MG tablet Take 10 mg by mouth daily as needed for allergies.    . Cholecalciferol (VITAMIN D3) 5000 units CAPS Take 5,000 Units by mouth 2 (two) times daily.    . colchicine 0.6 MG tablet Take 0.6 mg by mouth daily as needed (for gout flare/pain).     . Cyanocobalamin (B-12) 5000 MCG CAPS Take 5,000 mcg by mouth daily.     . furosemide (LASIX) 20 MG  tablet Take 20 mg by mouth daily as needed.     . gabapentin (NEURONTIN) 300 MG capsule Take 600 mg by mouth 3 (three) times daily.     Marland Kitchen. HYDROcodone-acetaminophen (NORCO/VICODIN) 5-325 MG tablet Take 1-2 tablets by mouth every 4 (four) hours as needed for moderate pain. 20 tablet 0  . ibuprofen (ADVIL,MOTRIN) 800 MG tablet Take 800 mg by mouth every 8 (eight) hours as needed for headache or moderate pain.     . isosorbide mononitrate (IMDUR) 30 MG 24 hr tablet TAKE 1 TABLET EVERY DAY (Patient taking differently: Take 30 mg by mouth every day) 90 tablet 3  . levothyroxine (SYNTHROID, LEVOTHROID) 200 MCG tablet Take 1 tablet (200 mcg total) by mouth daily. 30 tablet 6  . metFORMIN (GLUCOPHAGE-XR) 500 MG 24 hr tablet Take 500 mg by mouth 2 (two) times daily.    . Multiple Vitamin (MULTIVITAMIN WITH MINERALS) TABS tablet Take 1 tablet by mouth  daily.    . nebivolol (BYSTOLIC) 5 MG tablet Take 5 mg by mouth daily.    Marland Kitchen. OVER THE COUNTER MEDICATION Take 1 capsule by mouth daily. Hydroxycut Supplement    . potassium chloride (K-DUR,KLOR-CON) 10 MEQ tablet Take 10 mEq by mouth 2 (two) times daily.    Carlena Hurl. XARELTO 20 MG TABS tablet TAKE ONE TABLET BY MOUTH DAILY (Patient taking differently: Take 20 mg by mouth daily) 30 tablet 3   No current facility-administered medications for this visit.     REVIEW OF SYSTEMS:  [X]  denotes positive finding, [ ]  denotes negative finding Cardiac  Comments:  Chest pain or chest pressure:    Shortness of breath upon exertion:    Short of breath when lying flat:    Irregular heart rhythm:        Vascular    Pain in calf, thigh, or hip brought on by ambulation:    Pain in feet at night that wakes you up from your sleep:     Blood clot in your veins:    Leg swelling:  x         PHYSICAL EXAM: Vitals:   06/22/17 0935  BP: 119/74  Pulse: 69  Resp: 18  Temp: 98.6 F (37 C)  SpO2: 98%  Weight: (!) 382 lb (173.3 kg)  Height: 6\' 4"  (1.93 m)    GENERAL: The patient is a well-nourished male, in no acute distress. The vital signs are documented above. CARDIOVASCULAR: Thickening with thrombosed great saphenous vein at the level of the calf on the left.  No skin irritation PULMONARY: There is good air exchange  MUSCULOSKELETAL: There are no major deformities or cyanosis. NEUROLOGIC: No focal weakness or paresthesias are detected. SKIN: There are no ulcers or rashes noted.  Circumferential hemosiderin deposit with changes consistent with severe chronic venous hypertension PSYCHIATRIC: The patient has a normal affect.  DATA:  Duplex today showed closure of the great saphenous vein from mid calf to 2.7 cm from the saphenofemoral junction  MEDICAL ISSUES: Successful staged bilateral laser ablation of left great saphenous vein.  We will continue his compression garments for 1 additional week.  He has  been very compliant with thigh-high compression and reports that knee-high compression give him the sensation of cutting into his proximal calves.  He was encouraged to continue his due to his severe deep venous hypertension as well.  We will see him again on an as-needed basis    Larina Earthlyodd F. Tymesha Ditmore, MD FACS [facility]  Tel [phone] Pager [phone]

## 2017-06-28 ENCOUNTER — Other Ambulatory Visit: Payer: Self-pay | Admitting: Cardiovascular Disease

## 2017-08-03 ENCOUNTER — Ambulatory Visit: Payer: Medicare PPO | Admitting: "Endocrinology

## 2017-09-28 ENCOUNTER — Other Ambulatory Visit: Payer: Self-pay | Admitting: "Endocrinology

## 2017-10-03 ENCOUNTER — Telehealth: Payer: Self-pay | Admitting: Cardiovascular Disease

## 2017-10-03 NOTE — Telephone Encounter (Signed)
Patient states that he needs Tier exception for Xarelto sent to [facility]. States that pharmacy told him to contact our office/tg

## 2017-10-11 NOTE — Telephone Encounter (Signed)
Spoke with Frank Shelton from [facility] pharmacy who states that pt needs to reapply to [employer] program in order to get tier exception for Xarelto. Pt notified he currently has to pay $ 8.55 at [facility]. Pt notified and voiced understanding

## 2017-12-07 ENCOUNTER — Other Ambulatory Visit: Payer: Self-pay | Admitting: Cardiovascular Disease

## 2018-06-07 ENCOUNTER — Other Ambulatory Visit: Payer: Self-pay | Admitting: "Endocrinology

## 2018-08-31 ENCOUNTER — Telehealth: Payer: Self-pay

## 2018-08-31 NOTE — Telephone Encounter (Signed)
[employer] approved tier exception for xarelto through 05/16/2019 to be covered at tier 1 cost

## 2018-10-09 ENCOUNTER — Other Ambulatory Visit: Payer: Self-pay | Admitting: Cardiovascular Disease

## 2020-05-16 ENCOUNTER — Encounter (HOSPITAL_COMMUNITY): Payer: Self-pay | Admitting: Emergency Medicine

## 2020-05-16 ENCOUNTER — Emergency Department (HOSPITAL_COMMUNITY): Payer: Medicare PPO

## 2020-05-16 ENCOUNTER — Other Ambulatory Visit: Payer: Self-pay

## 2020-05-16 ENCOUNTER — Observation Stay (HOSPITAL_COMMUNITY)
Admission: EM | Admit: 2020-05-16 | Discharge: 2020-05-18 | Disposition: A | Discharge status: Home / Self Care | Payer: Medicare PPO | Attending: Internal Medicine | Admitting: Internal Medicine

## 2020-05-16 DIAGNOSIS — E1165 Type 2 diabetes mellitus with hyperglycemia: Secondary | ICD-10-CM | POA: Diagnosis not present

## 2020-05-16 DIAGNOSIS — Z23 Encounter for immunization: Secondary | ICD-10-CM | POA: Insufficient documentation

## 2020-05-16 DIAGNOSIS — U071 COVID-19: Principal | ICD-10-CM | POA: Insufficient documentation

## 2020-05-16 DIAGNOSIS — Z87891 Personal history of nicotine dependence: Secondary | ICD-10-CM | POA: Insufficient documentation

## 2020-05-16 DIAGNOSIS — Z7984 Long term (current) use of oral hypoglycemic drugs: Secondary | ICD-10-CM | POA: Diagnosis not present

## 2020-05-16 DIAGNOSIS — E890 Postprocedural hypothyroidism: Secondary | ICD-10-CM | POA: Diagnosis present

## 2020-05-16 DIAGNOSIS — I200 Unstable angina: Secondary | ICD-10-CM

## 2020-05-16 DIAGNOSIS — G4733 Obstructive sleep apnea (adult) (pediatric): Secondary | ICD-10-CM

## 2020-05-16 DIAGNOSIS — R0789 Other chest pain: Secondary | ICD-10-CM

## 2020-05-16 DIAGNOSIS — E039 Hypothyroidism, unspecified: Secondary | ICD-10-CM | POA: Diagnosis not present

## 2020-05-16 DIAGNOSIS — Z79899 Other long term (current) drug therapy: Secondary | ICD-10-CM | POA: Insufficient documentation

## 2020-05-16 DIAGNOSIS — R739 Hyperglycemia, unspecified: Secondary | ICD-10-CM

## 2020-05-16 DIAGNOSIS — Z8673 Personal history of transient ischemic attack (TIA), and cerebral infarction without residual deficits: Secondary | ICD-10-CM | POA: Insufficient documentation

## 2020-05-16 DIAGNOSIS — Z7901 Long term (current) use of anticoagulants: Secondary | ICD-10-CM | POA: Diagnosis not present

## 2020-05-16 DIAGNOSIS — Z86718 Personal history of other venous thrombosis and embolism: Secondary | ICD-10-CM

## 2020-05-16 DIAGNOSIS — I10 Essential (primary) hypertension: Secondary | ICD-10-CM | POA: Diagnosis not present

## 2020-05-16 DIAGNOSIS — R079 Chest pain, unspecified: Secondary | ICD-10-CM | POA: Diagnosis present

## 2020-05-16 DIAGNOSIS — I25119 Atherosclerotic heart disease of native coronary artery with unspecified angina pectoris: Secondary | ICD-10-CM | POA: Insufficient documentation

## 2020-05-16 DIAGNOSIS — M109 Gout, unspecified: Secondary | ICD-10-CM | POA: Diagnosis present

## 2020-05-16 LAB — CBC
HCT: 45.8 % (ref 39.0–52.0)
Hemoglobin: 14.9 g/dL (ref 13.0–17.0)
MCH: 32 pg (ref 26.0–34.0)
MCHC: 32.5 g/dL (ref 30.0–36.0)
MCV: 98.3 fL (ref 80.0–100.0)
Platelets: 194 10*3/uL (ref 150–400)
RBC: 4.66 MIL/uL (ref 4.22–5.81)
RDW: 13.6 % (ref 11.5–15.5)
WBC: 9.6 10*3/uL (ref 4.0–10.5)
nRBC: 0 % (ref 0.0–0.2)

## 2020-05-16 LAB — BASIC METABOLIC PANEL
Anion gap: 13 (ref 5–15)
BUN: 28 mg/dL — ABNORMAL HIGH (ref 8–23)
CO2: 20 mmol/L — ABNORMAL LOW (ref 22–32)
Calcium: 8.9 mg/dL (ref 8.9–10.3)
Chloride: 102 mmol/L (ref 98–111)
Creatinine, Ser: 1.03 mg/dL (ref 0.61–1.24)
GFR, Estimated: >60 mL/min (ref >60)
Glucose, Bld: 313 mg/dL — ABNORMAL HIGH (ref 70–99)
Potassium: 4.3 mmol/L (ref 3.5–5.1)
Sodium: 135 mmol/L (ref 135–145)

## 2020-05-16 LAB — TROPONIN I (HIGH SENSITIVITY)
Troponin I (High Sensitivity): 3 ng/L (ref <18)
Troponin I (High Sensitivity): 2 ng/L (ref <18)

## 2020-05-16 LAB — RESP PANEL BY RT-PCR (FLU A&B, COVID) ARPGX2
Influenza A by PCR: NEGATIVE
Influenza B by PCR: NEGATIVE
SARS Coronavirus 2 by RT PCR: POSITIVE — AB

## 2020-05-16 MED ORDER — ISOSORBIDE MONONITRATE ER 60 MG PO TB24
30.0000 mg | ORAL_TABLET | Freq: Every day | ORAL | Status: DC
  Administered 2020-05-17 – 2020-05-18 (×2): 30 mg via ORAL
  Filled 2020-05-16 (×4): qty 1

## 2020-05-16 MED ORDER — VITAMIN D 25 MCG (1000 UNIT) PO TABS
5000.0000 [IU] | ORAL_TABLET | Freq: Two times a day (BID) | ORAL | Status: DC
  Administered 2020-05-17 – 2020-05-18 (×4): 5000 [IU] via ORAL
  Filled 2020-05-16 (×4): qty 5

## 2020-05-16 MED ORDER — RIVAROXABAN 20 MG PO TABS
20.0000 mg | ORAL_TABLET | Freq: Every day | ORAL | Status: DC
  Administered 2020-05-17 – 2020-05-18 (×2): 20 mg via ORAL
  Filled 2020-05-16 (×2): qty 1

## 2020-05-16 MED ORDER — MOMETASONE FURO-FORMOTEROL FUM 200-5 MCG/ACT IN AERO
2.0000 | INHALATION_SPRAY | Freq: Two times a day (BID) | RESPIRATORY_TRACT | Status: DC
  Administered 2020-05-17 – 2020-05-18 (×3): 2 via RESPIRATORY_TRACT
  Filled 2020-05-16: qty 8.8

## 2020-05-16 MED ORDER — INSULIN ASPART 100 UNIT/ML ~~LOC~~ SOLN
0.0000 [IU] | Freq: Three times a day (TID) | SUBCUTANEOUS | Status: DC
  Administered 2020-05-17: 3 [IU] via SUBCUTANEOUS
  Administered 2020-05-17 – 2020-05-18 (×3): 5 [IU] via SUBCUTANEOUS
  Administered 2020-05-18: 3 [IU] via SUBCUTANEOUS

## 2020-05-16 MED ORDER — INSULIN ASPART 100 UNIT/ML ~~LOC~~ SOLN: 0.0000 [IU] | Freq: Every day | SUBCUTANEOUS | Status: DC

## 2020-05-16 MED ORDER — NITROGLYCERIN 0.4 MG SL SUBL
0.4000 mg | SUBLINGUAL_TABLET | SUBLINGUAL | Status: DC | PRN
  Administered 2020-05-17 (×4): 0.4 mg via SUBLINGUAL
  Filled 2020-05-16 (×2): qty 1

## 2020-05-16 MED ORDER — COLCHICINE 0.6 MG PO TABS: 0.6000 mg | ORAL_TABLET | Freq: Every day | ORAL | Status: DC | PRN

## 2020-05-16 MED ORDER — ASPIRIN 81 MG PO CHEW
243.0000 mg | CHEWABLE_TABLET | Freq: Once | ORAL | Status: AC
  Administered 2020-05-16: 243 mg via ORAL
  Filled 2020-05-16: qty 3

## 2020-05-16 MED ORDER — LEVOTHYROXINE SODIUM 100 MCG PO TABS
200.0000 ug | ORAL_TABLET | Freq: Every day | ORAL | Status: DC
  Administered 2020-05-17 – 2020-05-18 (×2): 200 ug via ORAL
  Filled 2020-05-16 (×2): qty 2

## 2020-05-16 MED ORDER — ALLOPURINOL 300 MG PO TABS
300.0000 mg | ORAL_TABLET | Freq: Every day | ORAL | Status: DC
  Administered 2020-05-17 – 2020-05-18 (×2): 300 mg via ORAL
  Filled 2020-05-16 (×4): qty 1

## 2020-05-16 MED ORDER — CALCIUM CARBONATE-VITAMIN D 500-200 MG-UNIT PO TABS
1.0000 | ORAL_TABLET | Freq: Two times a day (BID) | ORAL | Status: DC
  Administered 2020-05-17 – 2020-05-18 (×4): 1 via ORAL
  Filled 2020-05-16 (×8): qty 1

## 2020-05-16 MED ORDER — ASPIRIN EC 81 MG PO TBEC
81.0000 mg | DELAYED_RELEASE_TABLET | Freq: Every day | ORAL | Status: DC
  Administered 2020-05-17 – 2020-05-18 (×2): 81 mg via ORAL
  Filled 2020-05-16 (×2): qty 1

## 2020-05-16 MED ORDER — OXYCODONE HCL 5 MG PO TABS
5.0000 mg | ORAL_TABLET | Freq: Once | ORAL | Status: AC
  Administered 2020-05-16: 5 mg via ORAL
  Filled 2020-05-16: qty 1

## 2020-05-16 MED ORDER — NEBIVOLOL HCL 10 MG PO TABS
5.0000 mg | ORAL_TABLET | Freq: Every day | ORAL | Status: DC
  Administered 2020-05-17 – 2020-05-18 (×2): 5 mg via ORAL
  Filled 2020-05-16 (×4): qty 1

## 2020-05-16 MED ORDER — IOHEXOL 350 MG/ML SOLN
100.0000 mL | Freq: Once | INTRAVENOUS | Status: AC | PRN
  Administered 2020-05-16: 100 mL via INTRAVENOUS

## 2020-05-16 MED ORDER — LORAZEPAM 1 MG PO TABS: 1.0000 mg | ORAL_TABLET | Freq: Once | ORAL | Status: DC

## 2020-05-16 NOTE — H&P (Signed)
History and Physical  SRIRAM FEBLES ZOX:[id number] DOB: [birth year] DOA: 05/16/2020  Referring physician: Sabas Sous, MD PCP: Patient, No Pcp Per  Patient coming from: Home  Chief Complaint: Chest pain  HPI: Frank Shelton is a 60 y.o. male with medical history significant for CAD, hypothyroidism, type 2 diabetes mellitus, PE, gout and obesity who presents to the emergency department due to 1 day onset of chest pain.  Chest pain was intermittent and was described as tightness in center of chest with radiation to right arm, chest pain worsens with ambulation and patient will barely walk 5-6 steps without needing to stop to rest.  Chest pain as reproducible and nonreproducible component.  This was associated with occasional lightheadedness and excessive sweating.  He denies leg swelling, nausea, vomiting.  Patient endorsed recent missing of few doses of Xarelto.  Patient has never received any Covid vaccine.  ED Course: In the emergency department, stable.  Work-up in the ED showed normal CBC and BMP except for elevated BUN at 28 and hyperglycemia.  Troponin x2 was negative.  SARS coronavirus 2 was positive. CT angiography chest with contrast showed no definite evidence of pulmonary embolus, but showed hepatic steatosis.  Chest x-ray showed no active disease.  Aspirin was given, oxycodone 5 Mg x1 was given. Cardiology on call (Dr. Thelma Barge) was consulted and recommended hospitalist admission and cardiology consult in the morning per ED physician.  Review of Systems Constitutional: Negative for chills and fever.  HENT: Negative for ear pain and sore throat.   Eyes: Negative for pain and visual disturbance.  Respiratory: Negative for cough, chest tightness and shortness of breath.   Cardiovascular: Positive for chest pain.  Negative for palpitations.  Gastrointestinal: Negative for abdominal pain and vomiting.  Endocrine: Negative for polyphagia and polyuria.  Genitourinary: Negative for  decreased urine volume, dysuria, enuresis Musculoskeletal: Negative for arthralgias and back pain.  Skin: Negative for color change and rash.  Allergic/Immunologic: Negative for immunocompromised state.  Neurological: Negative for tremors, syncope, speech difficulty, weakness, light-headedness and headaches.  Hematological: Does not bruise/bleed easily.  All other systems reviewed and are negative    Past Medical History:  Diagnosis Date  . Arthritis    L shoulder & fingers   . Bronchitis    during enviromental changes   . Complication of anesthesia    Report of airway problem  . Diabetes mellitus without complication (HCC)   . Dyspnea   . Embolism - blood clot   . Gout   . Headache    uses ibuprofen & tylenol & its relieved   . Hypertension   . Hypothyroidism   . Myocardial infarction (HCC) 2012  . OSA on CPAP   . Peripheral vascular disease (HCC)   . Pulmonary embolism (HCC)   . Stroke [facility]) 2014   Past Surgical History:  Procedure Laterality Date  . HELLER MYOTOMY  2014   [employer]  . IVC FILTER INSERTION  2010  . ROTATOR CUFF REPAIR Right   . SKIN GRAFT     x3- to face., child   . THYROIDECTOMY N/A 03/09/2017   Procedure: TOTAL THYROIDECTOMY;  Surgeon: Darnell Level, MD;  Location: [facility] OR;  Service: General;  Laterality: N/A;  . TOTAL THYROIDECTOMY  03/09/2017    Social History:  reports that he quit smoking about 14 years ago. He started smoking about 50 years ago. He has a 30.00 pack-year smoking history. He has quit using smokeless tobacco. He reports current alcohol use. He  reports that he does not use drugs.   Allergies  Allergen Reactions  . Nabumetone Hives    Family History  Problem Relation Age of Onset  . Heart attack Sister 30       Two stents  . Heart attack Mother   . Heart attack Maternal Grandmother   . Heart attack Maternal Grandfather      Prior to Admission medications   Medication Sig Start Date End Date Taking? Authorizing  Provider  acetaminophen (TYLENOL) 325 MG tablet Take 650 mg by mouth every 6 (six) hours as needed for moderate pain or headache.    [provider]  allopurinol (ZYLOPRIM) 300 MG tablet Take 300 mg by mouth daily.    [provider]  budesonide-formoterol (SYMBICORT) 160-4.5 MCG/ACT inhaler Inhale 2 puffs into the lungs 2 (two) times daily as needed (for shortness of breath).     [provider]  calcium carbonate (TUMS) 500 MG chewable tablet Chew 2 tablets (400 mg of elemental calcium total) by mouth 4 (four) times daily. 03/11/17   Darnell Level, MD  calcium-vitamin D (OSCAL 500/200 D-3) 500-200 MG-UNIT tablet Take 1 tablet by mouth 2 (two) times daily. 02/02/17   [name], MD  cetirizine (ZYRTEC) 10 MG tablet Take 10 mg by mouth daily as needed for allergies.    [provider]  Cholecalciferol (VITAMIN D3) 5000 units CAPS Take 5,000 Units by mouth 2 (two) times daily.    [provider]  colchicine 0.6 MG tablet Take 0.6 mg by mouth daily as needed (for gout flare/pain).     [provider]  Cyanocobalamin (B-12) 5000 MCG CAPS Take 5,000 mcg by mouth daily.     [provider]  furosemide (LASIX) 20 MG tablet Take 20 mg by mouth daily as needed.     [provider]  gabapentin (NEURONTIN) 300 MG capsule Take 600 mg by mouth 3 (three) times daily.  11/19/13   [provider]  HYDROcodone-acetaminophen (NORCO/VICODIN) 5-325 MG tablet Take 1-2 tablets by mouth every 4 (four) hours as needed for moderate pain. 03/11/17   Darnell Level, MD  ibuprofen (ADVIL,MOTRIN) 800 MG tablet Take 800 mg by mouth every 8 (eight) hours as needed for headache or moderate pain.     [provider]  isosorbide mononitrate (IMDUR) 30 MG 24 hr tablet Take 30 mg by mouth every day 12/07/17   Laqueta Linden, MD  levothyroxine (SYNTHROID, LEVOTHROID) 200 MCG tablet TAKE 1 TABLET EVERY DAY 09/28/17   Roma Kayser, MD   metFORMIN (GLUCOPHAGE-XR) 500 MG 24 hr tablet Take 500 mg by mouth 2 (two) times daily. 11/19/13   [provider]  Multiple Vitamin (MULTIVITAMIN WITH MINERALS) TABS tablet Take 1 tablet by mouth daily.    [provider]  nebivolol (BYSTOLIC) 5 MG tablet Take 5 mg by mouth daily.    [provider]  OVER THE COUNTER MEDICATION Take 1 capsule by mouth daily. Hydroxycut Supplement    [provider]  potassium chloride (K-DUR,KLOR-CON) 10 MEQ tablet Take 10 mEq by mouth 2 (two) times daily. 11/19/13   [provider]  XARELTO 20 MG TABS tablet TAKE 1 TABLET EVERY DAY 10/10/18   Laqueta Linden, MD    Physical Exam: BP 136/62   Pulse 78   Temp 98 F (36.7 C) (Oral)   Resp (!) 25   Ht 6\' 4"  (1.93 m)   Wt (!) 145.2 kg   SpO2 96%  BMI 38.95 kg/m   . General: 60 y.o. year-old male well developed well nourished in no acute distress.  Alert and oriented x3. . Cardiovascular: Regular rate and rhythm with no rubs or gallops.  No thyromegaly or JVD noted.  No lower extremity edema. 2/4 pulses in all 4 extremities. Marland Kitchen Respiratory: Clear to auscultation with no wheezes or rales. Good inspiratory effort. . Abdomen: Soft nontender nondistended with normal bowel sounds x4 quadrants. . Muskuloskeletal: No cyanosis, clubbing or edema noted bilaterally . Neuro: CN II-XII intact, strength, sensation, reflexes . Skin: No ulcerative lesions noted or rashes . Psychiatry: Judgement and insight appear normal. Mood is appropriate for condition and setting          Labs on Admission:  Basic Metabolic Panel: Recent Labs  Lab 05/16/20 1507 05/17/20 0513  NA 135 137  K 4.3 3.7  CL 102 104  CO2 20* 24  GLUCOSE 313* 186*  BUN 28* 23  CREATININE 1.03 0.94  CALCIUM 8.9 8.6*  MG  --  2.1  PHOS  --  3.2   Liver Function Tests: Recent Labs  Lab 05/17/20 0513  AST 23  ALT 22  ALKPHOS 48  BILITOT 0.7  PROT 7.0  ALBUMIN 3.7   No results for input(s):  LIPASE, AMYLASE in the last 168 hours. No results for input(s): AMMONIA in the last 168 hours. CBC: Recent Labs  Lab 05/16/20 1507 05/17/20 0513  WBC 9.6 10.9*  HGB 14.9 13.5  HCT 45.8 41.0  MCV 98.3 97.4  PLT 194 170   Cardiac Enzymes: No results for input(s): CKTOTAL, CKMB, CKMBINDEX, TROPONINI in the last 168 hours.  BNP (last 3 results) No results for input(s): BNP in the last 8760 hours.  ProBNP (last 3 results) No results for input(s): PROBNP in the last 8760 hours.  CBG: Recent Labs  Lab 05/17/20 0024  GLUCAP 199*    Radiological Exams on Admission: CT ANGIO CHEST PE W OR WO CONTRAST  Result Date: 05/16/2020 CLINICAL DATA:  Chest pain. EXAM: CT ANGIOGRAPHY CHEST WITH CONTRAST TECHNIQUE: Multidetector CT imaging of the chest was performed using the standard protocol during bolus administration of intravenous contrast. Multiplanar CT image reconstructions and MIPs were obtained to evaluate the vascular anatomy. CONTRAST:  80 mL OMNIPAQUE IOHEXOL 350 MG/ML SOLN COMPARISON:  January 09, 2014. FINDINGS: Cardiovascular: Satisfactory opacification of the pulmonary arteries to the segmental level. No evidence of pulmonary embolism. Normal heart size. No pericardial effusion. Atherosclerosis of thoracic aorta is noted without aneurysm or dissection. Mediastinum/Nodes: Status post thyroidectomy. No adenopathy is noted. The esophagus is unremarkable. Lungs/Pleura: Lungs are clear. No pleural effusion or pneumothorax. Upper Abdomen: Hepatic steatosis is noted. Musculoskeletal: No chest wall abnormality. No acute or significant osseous findings. Review of the MIP images confirms the above findings. IMPRESSION: 1. No definite evidence of pulmonary embolus. 2. Hepatic steatosis. 3. Aortic atherosclerosis. Aortic Atherosclerosis (ICD10-I70.0). Electronically Signed   By: Lupita Raider M.D.   On: 05/16/2020 19:07   DG Chest Portable 1 View  Result Date: 05/16/2020 CLINICAL DATA:  Chest pain  EXAM: PORTABLE CHEST 1 VIEW COMPARISON:  03/07/2017 FINDINGS: No focal consolidation or effusion. Borderline cardiomegaly. No edema or pneumothorax. Possible faint nodule in the left upper lung. IMPRESSION: No active disease. Possible faint nodule in the left upper lung, consider CT chest for further evaluation. Electronically Signed   By: Jasmine Pang M.D.   On: 05/16/2020 16:54    EKG: I independently viewed the EKG done and my  findings are as followed: EKG shows normal sinus rhythm at a rate of 78 bpm  Assessment/Plan Present on Admission: . Postsurgical hypothyroidism . GOUT . OBSTRUCTIVE SLEEP APNEA  Principal Problem:   Atypical chest pain Active Problems:   GOUT   OBSTRUCTIVE SLEEP APNEA   PULMONARY EMBOLISM, HX OF   Postsurgical hypothyroidism   Hyperglycemia   COVID-19 virus infection   Atypical chest pain r/o unstable angina Cardiovascular risk factors include T2 DM,  obesity, CAD Chest pain has reproducible and nonreproducible complaint Continue telemetry Toponins x2 - 3 > 2 EKG showed normal sinus rhythm at a rate of 78 bpm  Cardiology on call was consulted by ED physician recommended cardiology consult in the morning.  Cardiology will be consulted to help decide if stress test is needed in am Versus other diagnostic modalities.   Aspirin was given in the ED Continue aspirin, nitroglycerin Continue Imdur and Bystolic per home regimen    COVID-19 virus infection SARS coronavirus 2 was positive Patient denies shortness of breath, O2 sat was 96-99% on room air  No indication for remdesivir or steroid at this time Continue vitamin-C 500 mg p.o. Daily Continue zinc 220 mg p.o. Daily Continue Tylenol p.r.n. for fever Continue airborne isolation precaution Continue monitoring daily inflammatory markers Physician PPE:  Surgical mask with face shield, N-95, nonsterile gloves, disposable gown, head and shoe cover s Patient PPE:  Face mask   History of PE Continue  home Xarelto  Hypothyroidism Continue Synthroid  Hyperglycemia secondary to type 2 diabetes mellitus Continue insulin sliding scale and hypoglycemic protocol Metformin will be held at this time  Obesity (BMI 38.95) Patient was counseled on diet and lifestyle modification  Gout Continue and colchicine  COPD Continue Symbicort  OSA on CPAP Continue CPAP  DVT prophylaxis: Xarelto  Code Status: Full code  Family Communication: None at bedside  Disposition Plan:  Patient is from:                        home Anticipated DC to:                   home Anticipated DC date:               1 day Anticipated DC barriers:           Patient is unstable to be discharged at this time due to chest pain which require cardiology evaluation in the morning   Consults called: Cardiology  Admission status: Observation    Bernadette Hoit MD [facility]  05/17/2020, 6:32 AM

## 2020-05-16 NOTE — ED Notes (Signed)
Date and time results received: 05/16/20 2240  Test: COVID Critical Value: positive Name of Provider Notified: Thomes Dinning, MD  Orders Received? Or Actions Taken?: acknowledged

## 2020-05-16 NOTE — H&P (Incomplete)
History and Physical  Frank Shelton LOV:[id number] DOB: 11-Jul-1960 DOA: 05/16/2020  Referring physician: Maudie Flakes, MD PCP: Patient, No Pcp Per  Patient coming from: Home  Chief Complaint: Chest pain  HPI: Frank Shelton is a 60 y.o. male with medical history significant for CAD, hypothyroidism, type 2 diabetes mellitus, PE, gout and obesity who presents to the emergency department due to 1 day onset of chest pain.   ED Course: In the emergency department, stable.  Work-up in the ED showed normal CBC and BMP except for elevated BUN at 28 and hyperglycemia.  Troponin x2 was negative. CT angiography chest with contrast showed no definite evidence of pulmonary embolus, but showed hepatic steatosis.  Chest x-ray showed no active disease.  Aspirin was given, oxycodone 5 Mg x1 was given. Cardiology on call (Dr. Dub Mikes) was consulted and recommended hospitalist admission and cardiology consult in the morning per ED physician.  Review of Systems Constitutional: Negative for chills and fever.  HENT: Negative for ear pain and sore throat.   Eyes: Negative for pain and visual disturbance.  Respiratory: Negative for cough, chest tightness and shortness of breath.   Cardiovascular: Positive for chest pain.  Negative for palpitations.  Gastrointestinal: Negative for abdominal pain and vomiting.  Endocrine: Negative for polyphagia and polyuria.  Genitourinary: Negative for decreased urine volume, dysuria, enuresis Musculoskeletal: Negative for arthralgias and back pain.  Skin: Negative for color change and rash.  Allergic/Immunologic: Negative for immunocompromised state.  Neurological: Negative for tremors, syncope, speech difficulty, weakness, light-headedness and headaches.  Hematological: Does not bruise/bleed easily.  All other systems reviewed and are negative    Past Medical History:  Diagnosis Date  . Arthritis    L shoulder & fingers   . Bronchitis    during enviromental  changes   . Complication of anesthesia    Report of airway problem  . Diabetes mellitus without complication ([place])   . Dyspnea   . Embolism - blood clot   . Gout   . Headache    uses ibuprofen & tylenol & its relieved   . Hypertension   . Hypothyroidism   . Myocardial infarction ([place]) 2012  . OSA on CPAP   . Peripheral vascular disease ([place])   . Pulmonary embolism ([place])   . Stroke [facility]) 2014   Past Surgical History:  Procedure Laterality Date  . HELLER MYOTOMY  2014   [employer]  . IVC FILTER INSERTION  2010  . ROTATOR CUFF REPAIR Right   . SKIN GRAFT     x3- to face., child   . THYROIDECTOMY N/A 03/09/2017   Procedure: TOTAL THYROIDECTOMY;  Surgeon: Armandina Gemma, MD;  Location: [place];  Service: General;  Laterality: N/A;  . TOTAL THYROIDECTOMY  03/09/2017    Social History:  reports that he quit smoking about 14 years ago. He started smoking about 50 years ago. He has a 30.00 pack-year smoking history. He has quit using smokeless tobacco. He reports current alcohol use. He reports that he does not use drugs.   Allergies  Allergen Reactions  . Nabumetone Hives    Family History  Problem Relation Age of Onset  . Heart attack Sister 30       Two stents  . Heart attack Mother   . Heart attack Maternal Grandmother   . Heart attack Maternal Grandfather     ***  Prior to Admission medications   Medication Sig Start Date End Date Taking? Authorizing Provider  acetaminophen (TYLENOL)  325 MG tablet Take 650 mg by mouth every 6 (six) hours as needed for moderate pain or headache.    [provider]  allopurinol (ZYLOPRIM) 300 MG tablet Take 300 mg by mouth daily.    [provider]  budesonide-formoterol (SYMBICORT) 160-4.5 MCG/ACT inhaler Inhale 2 puffs into the lungs 2 (two) times daily as needed (for shortness of breath).     [provider]  calcium carbonate (TUMS) 500 MG chewable tablet Chew 2 tablets (400 mg of elemental calcium total) by  mouth 4 (four) times daily. 03/11/17   Darnell Level, MD  calcium-vitamin D (OSCAL 500/200 D-3) 500-200 MG-UNIT tablet Take 1 tablet by mouth 2 (two) times daily. 02/02/17   [name], MD  cetirizine (ZYRTEC) 10 MG tablet Take 10 mg by mouth daily as needed for allergies.    [provider]  Cholecalciferol (VITAMIN D3) 5000 units CAPS Take 5,000 Units by mouth 2 (two) times daily.    [provider]  colchicine 0.6 MG tablet Take 0.6 mg by mouth daily as needed (for gout flare/pain).     [provider]  Cyanocobalamin (B-12) 5000 MCG CAPS Take 5,000 mcg by mouth daily.     [provider]  furosemide (LASIX) 20 MG tablet Take 20 mg by mouth daily as needed.     [provider]  gabapentin (NEURONTIN) 300 MG capsule Take 600 mg by mouth 3 (three) times daily.  11/19/13   [provider]  HYDROcodone-acetaminophen (NORCO/VICODIN) 5-325 MG tablet Take 1-2 tablets by mouth every 4 (four) hours as needed for moderate pain. 03/11/17   Darnell Level, MD  ibuprofen (ADVIL,MOTRIN) 800 MG tablet Take 800 mg by mouth every 8 (eight) hours as needed for headache or moderate pain.     [provider]  isosorbide mononitrate (IMDUR) 30 MG 24 hr tablet Take 30 mg by mouth every day 12/07/17   Laqueta Linden, MD  levothyroxine (SYNTHROID, LEVOTHROID) 200 MCG tablet TAKE 1 TABLET EVERY DAY 09/28/17   Roma Kayser, MD  metFORMIN (GLUCOPHAGE-XR) 500 MG 24 hr tablet Take 500 mg by mouth 2 (two) times daily. 11/19/13   [provider]  Multiple Vitamin (MULTIVITAMIN WITH MINERALS) TABS tablet Take 1 tablet by mouth daily.    [provider]  nebivolol (BYSTOLIC) 5 MG tablet Take 5 mg by mouth daily.    [provider]  OVER THE COUNTER MEDICATION Take 1 capsule by mouth daily. Hydroxycut Supplement    [provider]  potassium chloride (K-DUR,KLOR-CON) 10 MEQ tablet Take 10 mEq by mouth 2 (two) times daily.  11/19/13   [provider]  XARELTO 20 MG TABS tablet TAKE 1 TABLET EVERY DAY 10/10/18   Laqueta Linden, MD    Physical Exam: BP 123/69   Pulse 76   Temp 98.2 F (36.8 C) (Oral)   Resp 16   Ht 6\' 4"  (1.93 m)   Wt (!) 145.2 kg   SpO2 99%   BMI 38.95 kg/m   . General: 60 y.o. year-old male well developed well nourished in no acute distress.  Alert and oriented x3. . Cardiovascular: Regular rate and rhythm with no rubs or gallops.  No thyromegaly or JVD noted.  No lower extremity edema. 2/4 pulses in all 4 extremities. 77 Respiratory: Clear to auscultation with no wheezes or rales. Good inspiratory effort. . Abdomen: Soft nontender nondistended with normal bowel sounds x4 quadrants. . Muskuloskeletal: No cyanosis, clubbing or edema noted bilaterally .  Neuro: CN II-XII intact, strength, sensation, reflexes . Skin: No ulcerative lesions noted or rashes . Psychiatry: Judgement and insight appear normal. Mood is appropriate for condition and setting          Labs on Admission:  Basic Metabolic Panel: Recent Labs  Lab 05/16/20 1507  NA 135  K 4.3  CL 102  CO2 20*  GLUCOSE 313*  BUN 28*  CREATININE 1.03  CALCIUM 8.9   Liver Function Tests: No results for input(s): AST, ALT, ALKPHOS, BILITOT, PROT, ALBUMIN in the last 168 hours. No results for input(s): LIPASE, AMYLASE in the last 168 hours. No results for input(s): AMMONIA in the last 168 hours. CBC: Recent Labs  Lab 05/16/20 1507  WBC 9.6  HGB 14.9  HCT 45.8  MCV 98.3  PLT 194   Cardiac Enzymes: No results for input(s): CKTOTAL, CKMB, CKMBINDEX, TROPONINI in the last 168 hours.  BNP (last 3 results) No results for input(s): BNP in the last 8760 hours.  ProBNP (last 3 results) No results for input(s): PROBNP in the last 8760 hours.  CBG: No results for input(s): GLUCAP in the last 168 hours.  Radiological Exams on Admission: CT ANGIO CHEST PE W OR WO CONTRAST  Result Date: 05/16/2020 CLINICAL  DATA:  Chest pain. EXAM: CT ANGIOGRAPHY CHEST WITH CONTRAST TECHNIQUE: Multidetector CT imaging of the chest was performed using the standard protocol during bolus administration of intravenous contrast. Multiplanar CT image reconstructions and MIPs were obtained to evaluate the vascular anatomy. CONTRAST:  80 mL OMNIPAQUE IOHEXOL 350 MG/ML SOLN COMPARISON:  January 09, 2014. FINDINGS: Cardiovascular: Satisfactory opacification of the pulmonary arteries to the segmental level. No evidence of pulmonary embolism. Normal heart size. No pericardial effusion. Atherosclerosis of thoracic aorta is noted without aneurysm or dissection. Mediastinum/Nodes: Status post thyroidectomy. No adenopathy is noted. The esophagus is unremarkable. Lungs/Pleura: Lungs are clear. No pleural effusion or pneumothorax. Upper Abdomen: Hepatic steatosis is noted. Musculoskeletal: No chest wall abnormality. No acute or significant osseous findings. Review of the MIP images confirms the above findings. IMPRESSION: 1. No definite evidence of pulmonary embolus. 2. Hepatic steatosis. 3. Aortic atherosclerosis. Aortic Atherosclerosis (ICD10-I70.0). Electronically Signed   By: Lupita Raider M.D.   On: 05/16/2020 19:07   DG Chest Portable 1 View  Result Date: 05/16/2020 CLINICAL DATA:  Chest pain EXAM: PORTABLE CHEST 1 VIEW COMPARISON:  03/07/2017 FINDINGS: No focal consolidation or effusion. Borderline cardiomegaly. No edema or pneumothorax. Possible faint nodule in the left upper lung. IMPRESSION: No active disease. Possible faint nodule in the left upper lung, consider CT chest for further evaluation. Electronically Signed   By: Jasmine Pang M.D.   On: 05/16/2020 16:54    EKG: I independently viewed the EKG done and my findings are as followed: EKG shows normal sinus rhythm at a rate of 78 bpm  Assessment/Plan Present on Admission: . Postsurgical hypothyroidism . GOUT . Chest pain  Principal Problem:   Chest pain Active  Problems:   GOUT   PULMONARY EMBOLISM, HX OF   Postsurgical hypothyroidism   Chest pain r/o unstable angina Cardiovascular risk factors include type II there is mellitus, obesity, CAD Continue telemetry Toponins x2 - 3 > 2 EKG showed normal sinus rhythm at a rate of 78 bpm  Cardiology on call was consulted by ED physician recommended cardiology consult in the morning.  Cardiology will be consulted to help decide if Stress test is needed in am Versus other diagnostic modalities.   Aspirin was given in  the ED Continue aspirin, nitroglycerin Continue Imdur and Bystolic per home regimen    History of PE Continue home Xarelto  Hypothyroidism Continue Synthroid  Hyperglycemia secondary to type 2 diabetes mellitus Continue insulin sliding scale and hypoglycemic protocol Metformin will be held at this time  Obesity (BMI 38.95) Patient was counseled on diet and lifestyle modification  Gout Continue and colchicine  COPD Continue Symbicort  DVT prophylaxis: Xarelto  Code Status: Full code  Family Communication: None at bedside  Disposition Plan:  Patient is from:                        home Anticipated DC to:                   home Anticipated DC date:               1 day Anticipated DC barriers:           Patient is unstable to be discharged at this time due to chest pain which require cardiology evaluation in the morning   Consults called: Cardiology  Admission status: Observation    Frankey Shown MD [facility]  05/16/2020, 10:08 PM

## 2020-05-16 NOTE — ED Provider Notes (Signed)
[facility]-EMERGENCY DEPT [facility] Emergency Department Provider Note MRN:  [id number]  Arrival date & time: 05/16/20     Chief Complaint   Chest Pain   History of Present Illness   Frank Shelton is a 60 y.o. year-old male with a history of DM, stroke, PE, MI presenting to the ED with chief complaint of chest pain.  Intermittent chest pain since yesterday, described as a tightness in the center of the chest with paresthesias of the right arm.  Much worse with any ambulation, explains that since the start of the pain he has been unable to walk more than 5 or 6 steps without having to rest.  Endorsing occasional diaphoresis and lightheadedness, denies nausea or vomiting, no leg pain or swelling.  Has missed a few doses of his Xarelto recently.  Review of Systems  A complete 10 system review of systems was obtained and all systems are negative except as noted in the HPI and PMH.   Patient's Health History    Past Medical History:  Diagnosis Date  . Arthritis    L shoulder & fingers   . Bronchitis    during enviromental changes   . Complication of anesthesia    Report of airway problem  . Diabetes mellitus without complication ([facility])   . Dyspnea   . Embolism - blood clot   . Gout   . Headache    uses ibuprofen & tylenol & its relieved   . Hypertension   . Hypothyroidism   . Myocardial infarction ([facility]) 2012  . OSA on CPAP   . Peripheral vascular disease ([facility])   . Pulmonary embolism ([facility])   . Stroke [facility]) 2014    Past Surgical History:  Procedure Laterality Date  . HELLER MYOTOMY  2014   [employer]  . IVC FILTER INSERTION  2010  . ROTATOR CUFF REPAIR Right   . SKIN GRAFT     x3- to face., child   . THYROIDECTOMY N/A 03/09/2017   Procedure: TOTAL THYROIDECTOMY;  Surgeon: Darnell Level, MD;  Location: [facility] OR;  Service: General;  Laterality: N/A;  . TOTAL THYROIDECTOMY  03/09/2017    Family History  Problem Relation Age of Onset  . Heart attack Sister 30       Two  stents  . Heart attack Mother   . Heart attack Maternal Grandmother   . Heart attack Maternal Grandfather     Social History   Socioeconomic History  . Marital status: Divorced    Spouse name: Not on file  . Number of children: Not on file  . Years of education: Not on file  . Highest education level: Not on file  Occupational History  . Not on file  Tobacco Use  . Smoking status: Former Smoker    Packs/day: 1.00    Years: 30.00    Pack years: 30.00    Start date: 01/30/1970    Quit date: 01/30/2006    Years since quitting: 14.3  . Smokeless tobacco: Former Neurosurgeon  . Tobacco comment: 9/17 On and off since age 89- AJ  Vaping Use  . Vaping Use: Never used  Substance and Sexual Activity  . Alcohol use: Yes    Alcohol/week: 0.0 standard drinks    Comment: occasionally- sometimes goes 3 months without anything  . Drug use: No  . Sexual activity: Yes    Birth control/protection: None  Other Topics Concern  . Not on file  Social History Narrative  . Not on file  Social Determinants of Health   Financial Resource Strain: Not on file  Food Insecurity: Not on file  Transportation Needs: Not on file  Physical Activity: Not on file  Stress: Not on file  Social Connections: Not on file  Intimate Partner Violence: Not on file     Physical Exam   Vitals:   05/16/20 2030 05/16/20 2100  BP: 133/69 136/77  Pulse: 74 82  Resp: 16 15  Temp:    SpO2: 98% 97%    CONSTITUTIONAL: Well-appearing, NAD, obese NEURO:  Alert and oriented x 3, no focal deficits EYES:  eyes equal and reactive ENT/NECK:  no LAD, no JVD CARDIO: Regular rate, well-perfused, normal S1 and S2 PULM:  CTAB no wheezing or rhonchi GI/GU:  normal bowel sounds, non-distended, non-tender MSK/SPINE:  No gross deformities, no edema SKIN:  no rash, atraumatic PSYCH:  Appropriate speech and behavior  *Additional and/or pertinent findings included in MDM below  Diagnostic and Interventional Summary    EKG  Interpretation  Date/Time:  Saturday May 16 2020 13:56:53 EST Ventricular Rate:  78 PR Interval:  182 QRS Duration: 88 QT Interval:  390 QTC Calculation: 444 R Axis:   -14 Text Interpretation: Normal sinus rhythm Inferior infarct , age undetermined Abnormal ECG Confirmed by Kennis Carina [phone]) on 05/16/2020 6:13:46 PM      Labs Reviewed  BASIC METABOLIC PANEL - Abnormal; Notable for the following components:      Result Value   CO2 20 (*)    Glucose, Bld 313 (*)    BUN 28 (*)    All other components within normal limits  CBC  TROPONIN I (HIGH SENSITIVITY)  TROPONIN I (HIGH SENSITIVITY)    CT ANGIO CHEST PE W OR WO CONTRAST  Final Result    DG Chest Portable 1 View  Final Result      Medications  oxyCODONE (Oxy IR/ROXICODONE) immediate release tablet 5 mg (5 mg Oral Given 05/16/20 1813)  aspirin chewable tablet 243 mg (243 mg Oral Given 05/16/20 1813)  iohexol (OMNIPAQUE) 350 MG/ML injection 100 mL (100 mLs Intravenous Contrast Given 05/16/20 1857)     Procedures  /  Critical Care Procedures  ED Course and Medical Decision Making  I have reviewed the triage vital signs, the nursing notes, and pertinent available records from the EMR.  Listed above are laboratory and imaging tests that I personally ordered, reviewed, and interpreted and then considered in my medical decision making (see below for details).  Concern for unstable angina versus pulmonary embolism, awaiting second troponin and CTA.  EKG is without significant changes, first troponin negative.  With negative CTA would consult cardiology.     CT is without PE.  2 troponins are negative.  Discussed case with Dr. Thelma Barge of cardiology on-call, who recommends hospitalist admission and cardiology consultation in the morning.  Elmer Sow. Pilar Plate, MD [facility] Emergency Medicine [facility] [email or web address]  Final Clinical Impressions(s) / ED Diagnoses     ICD-10-CM   1. Unstable angina  (HCC)  I20.0     ED Discharge Orders    None       Discharge Instructions Discussed with and Provided to Patient:   Discharge Instructions   None       Sabas Sous, MD 05/16/20 2122

## 2020-05-16 NOTE — ED Notes (Signed)
Pt reports intermittent sharp shooting pain that starts in left lower abdominal quadrant up to chest.

## 2020-05-16 NOTE — ED Triage Notes (Signed)
Pt c/o left sided Chest pain that radiates down his right arm.  Pain is described as tightness with walking.

## 2020-05-17 ENCOUNTER — Observation Stay (HOSPITAL_BASED_OUTPATIENT_CLINIC_OR_DEPARTMENT_OTHER): Payer: Medicare PPO

## 2020-05-17 DIAGNOSIS — R0789 Other chest pain: Secondary | ICD-10-CM | POA: Diagnosis not present

## 2020-05-17 DIAGNOSIS — R079 Chest pain, unspecified: Secondary | ICD-10-CM

## 2020-05-17 DIAGNOSIS — U071 COVID-19: Secondary | ICD-10-CM

## 2020-05-17 LAB — PROTIME-INR
INR: 1.5 — ABNORMAL HIGH (ref 0.8–1.2)
Prothrombin Time: 17.7 seconds — ABNORMAL HIGH (ref 11.4–15.2)

## 2020-05-17 LAB — GLUCOSE, CAPILLARY
Glucose-Capillary: 169 mg/dL — ABNORMAL HIGH (ref 70–99)
Glucose-Capillary: 184 mg/dL — ABNORMAL HIGH (ref 70–99)
Glucose-Capillary: 199 mg/dL — ABNORMAL HIGH (ref 70–99)
Glucose-Capillary: 204 mg/dL — ABNORMAL HIGH (ref 70–99)
Glucose-Capillary: 233 mg/dL — ABNORMAL HIGH (ref 70–99)

## 2020-05-17 LAB — ECHOCARDIOGRAM LIMITED
Area-P 1/2: 2.87 cm2
Calc EF: 69.4 %
Height: 76 in
Single Plane A2C EF: 70.4 %
Single Plane A4C EF: 66.8 %
Weight: 5120 oz

## 2020-05-17 LAB — CBC
HCT: 41 % (ref 39.0–52.0)
Hemoglobin: 13.5 g/dL (ref 13.0–17.0)
MCH: 32.1 pg (ref 26.0–34.0)
MCHC: 32.9 g/dL (ref 30.0–36.0)
MCV: 97.4 fL (ref 80.0–100.0)
Platelets: 170 10*3/uL (ref 150–400)
RBC: 4.21 MIL/uL — ABNORMAL LOW (ref 4.22–5.81)
RDW: 13.5 % (ref 11.5–15.5)
WBC: 10.9 10*3/uL — ABNORMAL HIGH (ref 4.0–10.5)
nRBC: 0 % (ref 0.0–0.2)

## 2020-05-17 LAB — COMPREHENSIVE METABOLIC PANEL
ALT: 22 U/L (ref 0–44)
AST: 23 U/L (ref 15–41)
Albumin: 3.7 g/dL (ref 3.5–5.0)
Alkaline Phosphatase: 48 U/L (ref 38–126)
Anion gap: 9 (ref 5–15)
BUN: 23 mg/dL (ref 8–23)
CO2: 24 mmol/L (ref 22–32)
Calcium: 8.6 mg/dL — ABNORMAL LOW (ref 8.9–10.3)
Chloride: 104 mmol/L (ref 98–111)
Creatinine, Ser: 0.94 mg/dL (ref 0.61–1.24)
GFR, Estimated: >60 mL/min (ref >60)
Glucose, Bld: 186 mg/dL — ABNORMAL HIGH (ref 70–99)
Potassium: 3.7 mmol/L (ref 3.5–5.1)
Sodium: 137 mmol/L (ref 135–145)
Total Bilirubin: 0.7 mg/dL (ref 0.3–1.2)
Total Protein: 7 g/dL (ref 6.5–8.1)

## 2020-05-17 LAB — HIV ANTIBODY (ROUTINE TESTING W REFLEX): HIV Screen 4th Generation wRfx: NONREACTIVE

## 2020-05-17 LAB — HEMOGLOBIN A1C
Hgb A1c MFr Bld: 8.6 % — ABNORMAL HIGH (ref 4.8–5.6)
Mean Plasma Glucose: 200.12 mg/dL

## 2020-05-17 LAB — TROPONIN I (HIGH SENSITIVITY): Troponin I (High Sensitivity): 3 ng/L (ref <18)

## 2020-05-17 LAB — APTT: aPTT: 32 seconds (ref 24–36)

## 2020-05-17 LAB — PHOSPHORUS: Phosphorus: 3.2 mg/dL (ref 2.5–4.6)

## 2020-05-17 LAB — MAGNESIUM: Magnesium: 2.1 mg/dL (ref 1.7–2.4)

## 2020-05-17 MED ORDER — ALBUTEROL SULFATE HFA 108 (90 BASE) MCG/ACT IN AERS
2.0000 | INHALATION_SPRAY | RESPIRATORY_TRACT | Status: DC | PRN
  Administered 2020-05-17: 2 via RESPIRATORY_TRACT

## 2020-05-17 MED ORDER — CHLORHEXIDINE GLUCONATE CLOTH 2 % EX PADS
6.0000 | MEDICATED_PAD | Freq: Every day | CUTANEOUS | Status: DC
  Administered 2020-05-17: 6 via TOPICAL

## 2020-05-17 MED ORDER — ALBUTEROL SULFATE HFA 108 (90 BASE) MCG/ACT IN AERS
INHALATION_SPRAY | RESPIRATORY_TRACT | Status: AC
  Administered 2020-05-17: 2 via RESPIRATORY_TRACT
  Filled 2020-05-17: qty 6.7

## 2020-05-17 NOTE — Progress Notes (Signed)
PROGRESS NOTE    Frank Shelton  ZOX:[id number] DOB: [birth year] DOA: 05/16/2020 PCP: Patient, No Pcp Per   Brief Narrative:  Frank Shelton is a 60 y.o. male with medical history significant for CAD, hypothyroidism, type 2 diabetes mellitus, PE, gout and obesity who presents to the emergency department due to 1 day onset of chest pain.  Patient was admitted with atypical chest pain, but with concern for some unstable angina as well.  Discussed case with cardiology with plan for possible stress testing 1/3.  2D echocardiogram performed with no wall motion abnormalities noted.   Assessment & Plan:   Principal Problem:   Atypical chest pain Active Problems:   GOUT   OBSTRUCTIVE SLEEP APNEA   PULMONARY EMBOLISM, HX OF   Postsurgical hypothyroidism   Hyperglycemia   COVID-19 virus infection   Atypical chest pain r/o unstable angina Cardiovascular risk factors include T2 DM,  obesity, CAD Chest pain has reproducible and nonreproducible complaint Continue telemetry Toponins x2 - 3 > 2 EKG showed normal sinus rhythm at a rate of 78 bpm  Discussed case with Dr. Diona Browner, with plans for stress testing in a.m. plan to keep n.p.o. after midnight Aspirin was given in the ED Continue aspirin, nitroglycerin Continue Imdur and Bystolic per home regimen   Okay to transfer to telemetry 1/2  COVID-19 virus infection SARS coronavirus 2 was positive Patient denies shortness of breath, O2 sat was 96-99% on room air  No indication for remdesivir or steroid at this time Continue vitamin-C 500 mg p.o. Daily Continue zinc 220 mg p.o. Daily Continue Tylenol p.r.n. for fever Continue airborne isolation precaution Continue monitoring daily inflammatory markers Physician PPE:  Surgical mask with face shield, N-95, nonsterile gloves, disposable gown, head and shoe cover s Patient PPE:  Face mask   History of PE Continue home Xarelto  Hypothyroidism Continue Synthroid  Hyperglycemia  secondary to type 2 diabetes mellitus Continue insulin sliding scale and hypoglycemic protocol Metformin will be held at this time  Obesity (BMI 38.95) Patient was counseled on diet and lifestyle modification  Gout Continue and colchicine  COPD Continue Symbicort  OSA on CPAP Continue CPAP   DVT prophylaxis: Xarelto Code Status: Full Family Communication: None at bedside, patient will call Disposition Plan:  Status is: Observation  The patient will require care spanning > 2 midnights and should be moved to inpatient because: Ongoing diagnostic testing needed not appropriate for outpatient work up, IV treatments appropriate due to intensity of illness or inability to take PO and Inpatient level of care appropriate due to severity of illness  Dispo: The patient is from: Home              Anticipated d/c is to: Home              Anticipated d/c date is: 1 day              Patient currently is not medically stable to d/c.   Consultants:   Cardiology  Procedures:   See below  Antimicrobials:  Anti-infectives (From admission, onward)   None       Subjective: Patient seen and evaluated today with no chest pain or shortness of breath noted at rest.  He states that he does quickly become dyspneic with any exertion.  Objective: Vitals:   05/17/20 0700 05/17/20 0754 05/17/20 0841 05/17/20 1134  BP: 122/67     Pulse: 80 74    Resp: 16 18    Temp:  97.9  F (36.6 C)  98.2 F (36.8 C)  TempSrc:  Oral  Oral  SpO2: 97% 98% 97%   Weight:      Height:        Intake/Output Summary (Last 24 hours) at 05/17/2020 1220 Last data filed at 05/17/2020 0000 Gross per 24 hour  Intake 240 ml  Output 400 ml  Net -160 ml   Filed Weights   05/16/20 1405 05/16/20 1739  Weight: (!) 145.2 kg (!) 145.2 kg    Examination:  General exam: Appears calm and comfortable, morbidly obese Respiratory system: Clear to auscultation. Respiratory effort normal.  Currently on room  air. Cardiovascular system: S1 & S2 heard, RRR.  Gastrointestinal system: Abdomen is soft Central nervous system: Alert and awake Extremities: Bilateral edema with compression stockings Skin: No significant lesions noted Psychiatry: Flat affect.    Data Reviewed: I have personally reviewed following labs and imaging studies  CBC: Recent Labs  Lab 05/16/20 1507 05/17/20 0513  WBC 9.6 10.9*  HGB 14.9 13.5  HCT 45.8 41.0  MCV 98.3 97.4  PLT 194 948   Basic Metabolic Panel: Recent Labs  Lab 05/16/20 1507 05/17/20 0513  NA 135 137  K 4.3 3.7  CL 102 104  CO2 20* 24  GLUCOSE 313* 186*  BUN 28* 23  CREATININE 1.03 0.94  CALCIUM 8.9 8.6*  MG  --  2.1  PHOS  --  3.2   GFR: Estimated Creatinine Clearance: 128.6 mL/min (by C-G formula based on SCr of 0.94 mg/dL). Liver Function Tests: Recent Labs  Lab 05/17/20 0513  AST 23  ALT 22  ALKPHOS 48  BILITOT 0.7  PROT 7.0  ALBUMIN 3.7   No results for input(s): LIPASE, AMYLASE in the last 168 hours. No results for input(s): AMMONIA in the last 168 hours. Coagulation Profile: Recent Labs  Lab 05/17/20 0513  INR 1.5*   Cardiac Enzymes: No results for input(s): CKTOTAL, CKMB, CKMBINDEX, TROPONINI in the last 168 hours. BNP (last 3 results) No results for input(s): PROBNP in the last 8760 hours. HbA1C: Recent Labs    05/16/20 1507  HGBA1C 8.6*   CBG: Recent Labs  Lab 05/17/20 0024 05/17/20 0752 05/17/20 1136  GLUCAP 199* 204* 233*   Lipid Profile: No results for input(s): CHOL, HDL, LDLCALC, TRIG, CHOLHDL, LDLDIRECT in the last 72 hours. Thyroid Function Tests: No results for input(s): TSH, T4TOTAL, FREET4, T3FREE, THYROIDAB in the last 72 hours. Anemia Panel: No results for input(s): VITAMINB12, FOLATE, FERRITIN, TIBC, IRON, RETICCTPCT in the last 72 hours. Sepsis Labs: No results for input(s): PROCALCITON, LATICACIDVEN in the last 168 hours.  Recent Results (from the past 240 hour(s))  Resp Panel by  RT-PCR (Flu A&B, Covid) Nasopharyngeal Swab     Status: Abnormal   Collection Time: 05/16/20  9:42 PM   Specimen: Nasopharyngeal Swab; Nasopharyngeal(NP) swabs in vial transport medium  Result Value Ref Range Status   SARS Coronavirus 2 by RT PCR POSITIVE (A) NEGATIVE Final    Comment: RESULT CALLED TO, READ BACK BY AND VERIFIED WITH: WALKER,T @ 2239 ON 05/16/20 BY JUW (NOTE) SARS-CoV-2 target nucleic acids are DETECTED.  The SARS-CoV-2 RNA is generally detectable in upper respiratory specimens during the acute phase of infection. Positive results are indicative of the presence of the identified virus, but do not rule out bacterial infection or co-infection with other pathogens not detected by the test. Clinical correlation with patient history and other diagnostic information is necessary to determine patient infection status. The expected  result is Negative.  Fact Sheet for Patients: BloggerCourse.com  Fact Sheet for Healthcare Providers: SeriousBroker.it  This test is not yet approved or cleared by the [place] FDA and  has been authorized for detection and/or diagnosis of SARS-CoV-2 by FDA under an Emergency Use Authorization (EUA).  This EUA will remain in effect (meaning this test can be  used) for the duration of  the COVID-19 declaration under Section 564(b)(1) of the Act, 21 U.S.C. section 360bbb-3(b)(1), unless the authorization is terminated or revoked sooner.     Influenza A by PCR NEGATIVE NEGATIVE Final   Influenza B by PCR NEGATIVE NEGATIVE Final    Comment: (NOTE) The Xpert Xpress SARS-CoV-2/FLU/RSV plus assay is intended as an aid in the diagnosis of influenza from Nasopharyngeal swab specimens and should not be used as a sole basis for treatment. Nasal washings and aspirates are unacceptable for Xpert Xpress SARS-CoV-2/FLU/RSV testing.  Fact Sheet for  Patients: BloggerCourse.com  Fact Sheet for Healthcare Providers: SeriousBroker.it  This test is not yet approved or cleared by the [place] FDA and has been authorized for detection and/or diagnosis of SARS-CoV-2 by FDA under an Emergency Use Authorization (EUA). This EUA will remain in effect (meaning this test can be used) for the duration of the COVID-19 declaration under Section 564(b)(1) of the Act, 21 U.S.C. section 360bbb-3(b)(1), unless the authorization is terminated or revoked.  Performed at [facility], [address] [place], [place] [address]          Radiology Studies: CT ANGIO CHEST PE W OR WO CONTRAST  Result Date: 05/16/2020 CLINICAL DATA:  Chest pain. EXAM: CT ANGIOGRAPHY CHEST WITH CONTRAST TECHNIQUE: Multidetector CT imaging of the chest was performed using the standard protocol during bolus administration of intravenous contrast. Multiplanar CT image reconstructions and MIPs were obtained to evaluate the vascular anatomy. CONTRAST:  80 mL OMNIPAQUE IOHEXOL 350 MG/ML SOLN COMPARISON:  January 09, 2014. FINDINGS: Cardiovascular: Satisfactory opacification of the pulmonary arteries to the segmental level. No evidence of pulmonary embolism. Normal heart size. No pericardial effusion. Atherosclerosis of thoracic aorta is noted without aneurysm or dissection. Mediastinum/Nodes: Status post thyroidectomy. No adenopathy is noted. The esophagus is unremarkable. Lungs/Pleura: Lungs are clear. No pleural effusion or pneumothorax. Upper Abdomen: Hepatic steatosis is noted. Musculoskeletal: No chest wall abnormality. No acute or significant osseous findings. Review of the MIP images confirms the above findings. IMPRESSION: 1. No definite evidence of pulmonary embolus. 2. Hepatic steatosis. 3. Aortic atherosclerosis. Aortic Atherosclerosis (ICD10-I70.0). Electronically Signed   By: Lupita Raider M.D.   On: 05/16/2020 19:07   DG  Chest Portable 1 View  Result Date: 05/16/2020 CLINICAL DATA:  Chest pain EXAM: PORTABLE CHEST 1 VIEW COMPARISON:  03/07/2017 FINDINGS: No focal consolidation or effusion. Borderline cardiomegaly. No edema or pneumothorax. Possible faint nodule in the left upper lung. IMPRESSION: No active disease. Possible faint nodule in the left upper lung, consider CT chest for further evaluation. Electronically Signed   By: Jasmine Pang M.D.   On: 05/16/2020 16:54   ECHOCARDIOGRAM LIMITED  Result Date: 05/17/2020    ECHOCARDIOGRAM LIMITED REPORT   Patient Name:   Frank Shelton Date of Exam: 05/17/2020 Medical Rec #:  [id number]      Height:       76.0 in Accession #:    [id number]     Weight:       320.0 lb Date of Birth:  July 13, 1960      BSA:  2.705 m Patient Age:    59 years       BP:           122/67 mmHg Patient Gender: M              HR:           93 bpm. [facility] Procedure: Limited Echo, Cardiac Doppler and Color Doppler Indications:    Chest Pain R07.9  History:        Patient has prior history of Echocardiogram examinations, most                 recent 01/10/2014. Previous Myocardial Infarction, Stroke; Risk                 Factors:Hypertension, Diabetes and Former Smoker. Pulmonary                 embolism. PVD.  Sonographer:    Renella Cunas RDCS Referring Phys: [id number] [facility]  Sonographer Comments: No parasternal window and suboptimal subcostal window. Image acquisition challenging due to patient body habitus. Covid positive. IMPRESSIONS  1. Technically difficult; no parasternal views; LV function appears to be normal.  2. Left ventricular ejection fraction, by estimation, is 70 to 75%. The left ventricle has hyperdynamic function. The left ventricle has no regional wall motion abnormalities. Left ventricular diastolic parameters are consistent with Grade I diastolic dysfunction (impaired relaxation).  3. Right ventricular systolic function is normal. The right ventricular size is  normal.  4. The mitral valve is normal in structure. No evidence of mitral valve regurgitation. No evidence of mitral stenosis.  5. The aortic valve was not well visualized. Aortic valve regurgitation is not visualized. No aortic stenosis is present. FINDINGS  Left Ventricle: Left ventricular ejection fraction, by estimation, is 70 to 75%. The left ventricle has hyperdynamic function. The left ventricle has no regional wall motion abnormalities. The left ventricular internal cavity size was normal in size. There is no left ventricular hypertrophy. Left ventricular diastolic parameters are consistent with Grade I diastolic dysfunction (impaired relaxation). Right Ventricle: The right ventricular size is normal.Right ventricular systolic function is normal. Left Atrium: Left atrial size was normal in size. Right Atrium: Right atrial size was normal in size. Pericardium: There is no evidence of pericardial effusion. Mitral Valve: The mitral valve is normal in structure. No evidence of mitral valve stenosis. Tricuspid Valve: The tricuspid valve is normal in structure. Tricuspid valve regurgitation is trivial. No evidence of tricuspid stenosis. Aortic Valve: The aortic valve was not well visualized. Aortic valve regurgitation is not visualized. No aortic stenosis is present. Pulmonic Valve: The pulmonic valve was not well visualized. Aorta: The aortic root was not well visualized. Venous: The inferior vena cava was not well visualized.  Additional Comments: Technically difficult; no parasternal views; LV function appears to be normal.  LV Volumes (MOD) LV vol d, MOD A2C: 112.0 ml Diastology LV vol d, MOD A4C: 133.0 ml LV e' medial:    7.29 cm/s LV vol s, MOD A2C: 33.2 ml  LV E/e' medial:  9.2 LV vol s, MOD A4C: 44.1 ml  LV e' lateral:   7.62 cm/s LV SV MOD A2C:     78.8 ml  LV E/e' lateral: 8.8 LV SV MOD A4C:     133.0 ml LV SV MOD BP:      85.8 ml RIGHT VENTRICLE RV S prime:     13.70 cm/s TAPSE (M-mode): 2.3 cm MITRAL  VALVE  MV Area (PHT): 2.87 cm MV Decel Time: 264 msec MV E velocity: 67.10 cm/s MV A velocity: 87.90 cm/s MV E/A ratio:  0.76 Olga Millers MD Electronically signed by Olga Millers MD Signature Date/Time: 05/17/2020/11:09:33 AM    Final         Scheduled Meds: . allopurinol  300 mg Oral Daily  . aspirin EC  81 mg Oral Daily  . calcium-vitamin D  1 tablet Oral BID  . Chlorhexidine Gluconate Cloth  6 each Topical Daily  . cholecalciferol  5,000 Units Oral BID  . insulin aspart  0-15 Units Subcutaneous TID WC  . insulin aspart  0-5 Units Subcutaneous QHS  . isosorbide mononitrate  30 mg Oral Daily  . levothyroxine  200 mcg Oral Daily  . LORazepam  1 mg Oral Once  . mometasone-formoterol  2 puff Inhalation BID  . nebivolol  5 mg Oral Daily  . rivaroxaban  20 mg Oral Daily    LOS: 0 days    Time spent: 35 minutes    Dreanna Kyllo Hoover Brunette, DO [facility]  If 7PM-7AM, please contact night-coverage [email or web address] 05/17/2020, 12:20 PM

## 2020-05-17 NOTE — Care Management Obs Status (Signed)
MEDICARE OBSERVATION STATUS NOTIFICATION   Patient Details  Name: Frank Shelton MRN: [id number] Date of Birth: May 13, 1961   Medicare Observation Status Notification Given:  Yes    Barry Brunner, LCSW 05/17/2020, 5:16 PM

## 2020-05-17 NOTE — Progress Notes (Signed)
  Echocardiogram 2D Echocardiogram has been performed.  Burnard Hawthorne 05/17/2020, 10:44 AM

## 2020-05-17 NOTE — Progress Notes (Signed)
   Progress Note  Patient Name: Frank Shelton Date of Encounter: 05/17/2020  Primary Cardiologist: Previously Dr. Prentice Docker  Chart reviewed and case discussed via secure chat with Dr. Sherryll Burger.  Initial request was for assistance with arranging stress testing for tomorrow at [facility], however I see that the patient is admitted with active COVID-19.  He has had atypical chest pain recently, found to be SARS coronavirus 2 test positive.  Chest CTA shows no evidence of pulmonary embolus, no pulmonary infiltrates or effusions.  High-sensitivity troponin I level is 2.  WBC 10.9.  ECG shows sinus rhythm with possible old inferior infarct pattern.  He was last seen by Dr. Purvis Sheffield back in 2018.  History includes prior pulmonary embolus status post IVC filter, history of chest pain and also dyspnea on exertion.  Myoview in 2015 was low risk.  No definite history of obstructive CAD.  Would cycle cardiac enzymes, treat symptomatically for COVID-19.  Echocardiogram today shows LVEF 70 to 75% with no wall motion abnormalities.  If he continues to rule out for ACS, could be considered for a follow-up Lexiscan Myoview as an outpatient once he has recovered.  Signed, Nona Dell, MD  05/17/2020, 1:15 PM

## 2020-05-18 DIAGNOSIS — R0789 Other chest pain: Secondary | ICD-10-CM | POA: Diagnosis not present

## 2020-05-18 LAB — GLUCOSE, CAPILLARY
Glucose-Capillary: 164 mg/dL — ABNORMAL HIGH (ref 70–99)
Glucose-Capillary: 226 mg/dL — ABNORMAL HIGH (ref 70–99)

## 2020-05-18 LAB — C-REACTIVE PROTEIN: CRP: 0.9 mg/dL (ref <1.0)

## 2020-05-18 LAB — TROPONIN I (HIGH SENSITIVITY)
Troponin I (High Sensitivity): 3 ng/L (ref <18)
Troponin I (High Sensitivity): 2 ng/L (ref <18)

## 2020-05-18 LAB — D-DIMER, QUANTITATIVE: D-Dimer, Quant: 2.48 ug/mL-FEU — ABNORMAL HIGH (ref 0.00–0.50)

## 2020-05-18 LAB — FERRITIN: Ferritin: 175 ng/mL (ref 24–336)

## 2020-05-18 LAB — MRSA PCR SCREENING: MRSA by PCR: NEGATIVE

## 2020-05-18 LAB — LACTATE DEHYDROGENASE: LDH: 154 U/L (ref 98–192)

## 2020-05-18 MED ORDER — ALBUTEROL SULFATE HFA 108 (90 BASE) MCG/ACT IN AERS: 2.0000 | INHALATION_SPRAY | RESPIRATORY_TRACT | 2 refills | Status: AC | PRN

## 2020-05-18 MED ORDER — INFLUENZA VAC SPLIT QUAD 0.5 ML IM SUSY
0.5000 mL | PREFILLED_SYRINGE | Freq: Once | INTRAMUSCULAR | Status: AC
  Administered 2020-05-18: 0.5 mL via INTRAMUSCULAR
  Filled 2020-05-18: qty 0.5

## 2020-05-18 NOTE — Discharge Summary (Signed)
Physician Discharge Summary  Frank Shelton GGY:[id number] DOB: [birth year] DOA: 05/16/2020  PCP: Patient, No Pcp Per  Admit date: 05/16/2020  Discharge date: 05/18/2020  Admitted From:Home  Disposition:  Home  Recommendations for Outpatient Follow-up:  1. Follow up with PCP in 1-2 weeks 2. Follow-up with cardiology outpatient with appointment requested for stress testing 3. Continue other home medications as prior 4. Continue to quarantine at home as discussed with no treatment for Covid pneumonia required  Home Health: None  Equipment/Devices: None  Discharge Condition:Stable  CODE STATUS: Full  Diet recommendation: Heart Healthy/carb modified  Brief/Interim Summary: Frank Shelton a 60 y.o.malewith medical history significant forCAD, hypothyroidism, type 2 diabetes mellitus, PE, gout and obesity who presents to the emergency department due to 1 day onset of chest pain.  Patient was admitted with atypical chest pain, but with concern for some unstable angina as well.  Discussed case with cardiology with plan for possible stress testing 1/3, however patient was noted to be Covid positive and therefore decision was made to do this on an outpatient basis after ACS was ruled out.  Troponins overnight have remained negative.  2D echocardiogram performed with no wall motion abnormalities noted with LVEF 70-75% as noted below.  He is able to ambulate around the room into the bathroom without any further symptoms noted today and appears to be in stable condition for discharge.  Outpatient cardiology follow-up requested for stress testing.  No other acute events noted throughout the course of this admission.  Discharge Diagnoses:  Principal Problem:   Atypical chest pain Active Problems:   GOUT   OBSTRUCTIVE SLEEP APNEA   PULMONARY EMBOLISM, HX OF   Postsurgical hypothyroidism   Hyperglycemia   COVID-19 virus infection  Principal discharge diagnosis: Atypical chest pain ruled out for  ACS in the setting of COVID-19 virus infection.  Discharge Instructions  Discharge Instructions    Ambulatory referral to Cardiology   Complete by: As directed    Diet - low sodium heart healthy   Complete by: As directed    Increase activity slowly   Complete by: As directed      Allergies as of 05/18/2020      Reactions   Nabumetone Hives      Medication List    TAKE these medications   acetaminophen 325 MG tablet Commonly known as: TYLENOL Take 650 mg by mouth every 6 (six) hours as needed for moderate pain or headache.   albuterol 108 (90 Base) MCG/ACT inhaler Commonly known as: VENTOLIN HFA Inhale 2 puffs into the lungs every 4 (four) hours as needed for wheezing or shortness of breath.   allopurinol 300 MG tablet Commonly known as: ZYLOPRIM Take 300 mg by mouth daily.   aspirin EC 81 MG tablet Take 81 mg by mouth daily. Swallow whole.   B-12 5000 MCG Caps Take 5,000 mcg by mouth daily.   budesonide-formoterol 160-4.5 MCG/ACT inhaler Commonly known as: SYMBICORT Inhale 2 puffs into the lungs 2 (two) times daily as needed (for shortness of breath).   calcium carbonate 500 MG chewable tablet Commonly known as: Tums Chew 2 tablets (400 mg of elemental calcium total) by mouth 4 (four) times daily.   calcium-vitamin D 500-200 MG-UNIT tablet Commonly known as: Oscal 500/200 D-3 Take 1 tablet by mouth 2 (two) times daily.   cetirizine 10 MG tablet Commonly known as: ZYRTEC Take 10 mg by mouth daily as needed for allergies.   colchicine 0.6 MG tablet Take 0.6 mg by  mouth daily as needed (for gout flare/pain).   DULoxetine 30 MG capsule Commonly known as: CYMBALTA Take 30 mg by mouth daily.   furosemide 20 MG tablet Commonly known as: LASIX Take 20 mg by mouth daily as needed.   HYDROcodone-acetaminophen 5-325 MG tablet Commonly known as: NORCO/VICODIN Take 1-2 tablets by mouth every 4 (four) hours as needed for moderate pain.   ibuprofen 800 MG  tablet Commonly known as: ADVIL Take 800 mg by mouth every 8 (eight) hours as needed for headache or moderate pain.   isosorbide mononitrate 30 MG 24 hr tablet Commonly known as: IMDUR Take 30 mg by mouth every day   levothyroxine 200 MCG tablet Commonly known as: SYNTHROID TAKE 1 TABLET EVERY DAY What changed: when to take this   metFORMIN 500 MG 24 hr tablet Commonly known as: GLUCOPHAGE-XR Take 500 mg by mouth 2 (two) times daily.   multivitamin with minerals Tabs tablet Take 1 tablet by mouth daily.   nebivolol 5 MG tablet Commonly known as: BYSTOLIC Take 5 mg by mouth daily.   OVER THE COUNTER MEDICATION Take 1 capsule by mouth daily. Hydroxycut Supplement   potassium chloride 10 MEQ tablet Commonly known as: KLOR-CON Take 10 mEq by mouth 2 (two) times daily.   Vitamin D3 125 MCG (5000 UT) Caps Take 5,000 Units by mouth 2 (two) times daily.   Xarelto 20 MG Tabs tablet Generic drug: rivaroxaban TAKE 1 TABLET EVERY DAY What changed:   how much to take  when to take this       Follow-up Information    pcp Follow up in 2 week(s).        [facility] Follow up in 2 week(s).   Specialty: Cardiology Contact information: [address] [place] [address] [phone]             Allergies  Allergen Reactions  . Nabumetone Hives    Consultations:  Discussed case with cardiology   Procedures/Studies: CT ANGIO CHEST PE W OR WO CONTRAST  Result Date: 05/16/2020 CLINICAL DATA:  Chest pain. EXAM: CT ANGIOGRAPHY CHEST WITH CONTRAST TECHNIQUE: Multidetector CT imaging of the chest was performed using the standard protocol during bolus administration of intravenous contrast. Multiplanar CT image reconstructions and MIPs were obtained to evaluate the vascular anatomy. CONTRAST:  80 mL OMNIPAQUE IOHEXOL 350 MG/ML SOLN COMPARISON:  January 09, 2014. FINDINGS: Cardiovascular: Satisfactory opacification of the pulmonary arteries to the  segmental level. No evidence of pulmonary embolism. Normal heart size. No pericardial effusion. Atherosclerosis of thoracic aorta is noted without aneurysm or dissection. Mediastinum/Nodes: Status post thyroidectomy. No adenopathy is noted. The esophagus is unremarkable. Lungs/Pleura: Lungs are clear. No pleural effusion or pneumothorax. Upper Abdomen: Hepatic steatosis is noted. Musculoskeletal: No chest wall abnormality. No acute or significant osseous findings. Review of the MIP images confirms the above findings. IMPRESSION: 1. No definite evidence of pulmonary embolus. 2. Hepatic steatosis. 3. Aortic atherosclerosis. Aortic Atherosclerosis (ICD10-I70.0). Electronically Signed   By: Lupita Raider M.D.   On: 05/16/2020 19:07   DG Chest Portable 1 View  Result Date: 05/16/2020 CLINICAL DATA:  Chest pain EXAM: PORTABLE CHEST 1 VIEW COMPARISON:  03/07/2017 FINDINGS: No focal consolidation or effusion. Borderline cardiomegaly. No edema or pneumothorax. Possible faint nodule in the left upper lung. IMPRESSION: No active disease. Possible faint nodule in the left upper lung, consider CT chest for further evaluation. Electronically Signed   By: Jasmine Pang M.D.   On: 05/16/2020 16:54  ECHOCARDIOGRAM LIMITED  Result Date: 05/17/2020    ECHOCARDIOGRAM LIMITED REPORT   Patient Name:   Frank Shelton Date of Exam: 05/17/2020 Medical Rec #:  [id number]      Height:       76.0 in Accession #:    [id number]     Weight:       320.0 lb Date of Birth:  Sep 23, 1960      BSA:          2.705 m Patient Age:    59 years       BP:           122/67 mmHg Patient Gender: M              HR:           93 bpm. [facility] Procedure: Limited Echo, Cardiac Doppler and Color Doppler Indications:    Chest Pain R07.9  History:        Patient has prior history of Echocardiogram examinations, most                 recent 01/10/2014. Previous Myocardial Infarction, Stroke; Risk                 Factors:Hypertension, Diabetes and  Former Smoker. Pulmonary                 embolism. PVD.  Sonographer:    Renella Cunas RDCS Referring Phys: [id number] Lamont Dowdy [facility]  Sonographer Comments: No parasternal window and suboptimal subcostal window. Image acquisition challenging due to patient body habitus. Covid positive. IMPRESSIONS  1. Technically difficult; no parasternal views; LV function appears to be normal.  2. Left ventricular ejection fraction, by estimation, is 70 to 75%. The left ventricle has hyperdynamic function. The left ventricle has no regional wall motion abnormalities. Left ventricular diastolic parameters are consistent with Grade I diastolic dysfunction (impaired relaxation).  3. Right ventricular systolic function is normal. The right ventricular size is normal.  4. The mitral valve is normal in structure. No evidence of mitral valve regurgitation. No evidence of mitral stenosis.  5. The aortic valve was not well visualized. Aortic valve regurgitation is not visualized. No aortic stenosis is present. FINDINGS  Left Ventricle: Left ventricular ejection fraction, by estimation, is 70 to 75%. The left ventricle has hyperdynamic function. The left ventricle has no regional wall motion abnormalities. The left ventricular internal cavity size was normal in size. There is no left ventricular hypertrophy. Left ventricular diastolic parameters are consistent with Grade I diastolic dysfunction (impaired relaxation). Right Ventricle: The right ventricular size is normal.Right ventricular systolic function is normal. Left Atrium: Left atrial size was normal in size. Right Atrium: Right atrial size was normal in size. Pericardium: There is no evidence of pericardial effusion. Mitral Valve: The mitral valve is normal in structure. No evidence of mitral valve stenosis. Tricuspid Valve: The tricuspid valve is normal in structure. Tricuspid valve regurgitation is trivial. No evidence of tricuspid stenosis. Aortic Valve: The aortic valve was not well  visualized. Aortic valve regurgitation is not visualized. No aortic stenosis is present. Pulmonic Valve: The pulmonic valve was not well visualized. Aorta: The aortic root was not well visualized. Venous: The inferior vena cava was not well visualized.  Additional Comments: Technically difficult; no parasternal views; LV function appears to be normal.  LV Volumes (MOD) LV vol d, MOD A2C: 112.0 ml Diastology LV vol d, MOD A4C: 133.0 ml LV e' medial:  7.29 cm/s LV vol s, MOD A2C: 33.2 ml  LV E/e' medial:  9.2 LV vol s, MOD A4C: 44.1 ml  LV e' lateral:   7.62 cm/s LV SV MOD A2C:     78.8 ml  LV E/e' lateral: 8.8 LV SV MOD A4C:     133.0 ml LV SV MOD BP:      85.8 ml RIGHT VENTRICLE RV S prime:     13.70 cm/s TAPSE (M-mode): 2.3 cm MITRAL VALVE MV Area (PHT): 2.87 cm MV Decel Time: 264 msec MV E velocity: 67.10 cm/s MV A velocity: 87.90 cm/s MV E/A ratio:  0.76 Olga MillersBrian Crenshaw MD Electronically signed by Olga MillersBrian Crenshaw MD Signature Date/Time: 05/17/2020/11:09:33 AM    Final       Discharge Exam: Vitals:   05/18/20 0831 05/18/20 0839  BP:  130/76  Pulse:  74  Resp:  18  Temp:    SpO2: 97% 98%   Vitals:   05/18/20 0009 05/18/20 0400 05/18/20 0831 05/18/20 0839  BP: (!) 105/50 117/72  130/76  Pulse: 72 67  74  Resp: 19 19  18   Temp: 98 F (36.7 C) (!) 97.5 F (36.4 C)    TempSrc: Oral Oral    SpO2: 95% 98% 97% 98%  Weight:      Height:        General: Pt is alert, awake, not in acute distress, obese Cardiovascular: RRR, S1/S2 +, no rubs, no gallops Respiratory: CTA bilaterally, no wheezing, no rhonchi Abdominal: Soft, NT, ND, bowel sounds + Extremities: no edema, no cyanosis    The results of significant diagnostics from this hospitalization (including imaging, microbiology, ancillary and laboratory) are listed below for reference.     Microbiology: Recent Results (from the past 240 hour(s))  Resp Panel by RT-PCR (Flu A&B, Covid) Nasopharyngeal Swab     Status: Abnormal    Collection Time: 05/16/20  9:42 PM   Specimen: Nasopharyngeal Swab; Nasopharyngeal(NP) swabs in vial transport medium  Result Value Ref Range Status   SARS Coronavirus 2 by RT PCR POSITIVE (A) NEGATIVE Final    Comment: RESULT CALLED TO, READ BACK BY AND VERIFIED WITH: WALKER,T @ 2239 ON 05/16/20 BY JUW (NOTE) SARS-CoV-2 target nucleic acids are DETECTED.  The SARS-CoV-2 RNA is generally detectable in upper respiratory specimens during the acute phase of infection. Positive results are indicative of the presence of the identified virus, but do not rule out bacterial infection or co-infection with other pathogens not detected by the test. Clinical correlation with patient history and other diagnostic information is necessary to determine patient infection status. The expected result is Negative.  Fact Sheet for Patients: BloggerCourse.comhttps://www.fda.gov/media/152166/download  Fact Sheet for Healthcare Providers: SeriousBroker.ithttps://www.fda.gov/media/152162/download  This test is not yet approved or cleared by the [place] FDA and  has been authorized for detection and/or diagnosis of SARS-CoV-2 by FDA under an Emergency Use Authorization (EUA).  This EUA will remain in effect (meaning this test can be  used) for the duration of  the COVID-19 declaration under Section 564(b)(1) of the Act, 21 U.S.C. section 360bbb-3(b)(1), unless the authorization is terminated or revoked sooner.     Influenza A by PCR NEGATIVE NEGATIVE Final   Influenza B by PCR NEGATIVE NEGATIVE Final    Comment: (NOTE) The Xpert Xpress SARS-CoV-2/FLU/RSV plus assay is intended as an aid in the diagnosis of influenza from Nasopharyngeal swab specimens and should not be used as a sole basis for treatment. Nasal washings and aspirates are unacceptable for Xpert  Xpress SARS-CoV-2/FLU/RSV testing.  Fact Sheet for Patients: BloggerCourse.com  Fact Sheet for Healthcare  Providers: SeriousBroker.it  This test is not yet approved or cleared by the [place] FDA and has been authorized for detection and/or diagnosis of SARS-CoV-2 by FDA under an Emergency Use Authorization (EUA). This EUA will remain in effect (meaning this test can be used) for the duration of the COVID-19 declaration under Section 564(b)(1) of the Act, 21 U.S.C. section 360bbb-3(b)(1), unless the authorization is terminated or revoked.  Performed at [facility], [address], [place], [place] [address]   MRSA PCR Screening     Status: None   Collection Time: 05/17/20 12:50 AM   Specimen: Nasal Mucosa; Nasopharyngeal  Result Value Ref Range Status   MRSA by PCR NEGATIVE NEGATIVE Final    Comment:        The GeneXpert MRSA Assay (FDA approved for NASAL specimens only), is one component of a comprehensive MRSA colonization surveillance program. It is not intended to diagnose MRSA infection nor to guide or monitor treatment for MRSA infections. Performed at [facility], [address] [place], [place] [address]      Labs: BNP (last 3 results) No results for input(s): BNP in the last 8760 hours. Basic Metabolic Panel: Recent Labs  Lab 05/16/20 1507 05/17/20 0513  NA 135 137  K 4.3 3.7  CL 102 104  CO2 20* 24  GLUCOSE 313* 186*  BUN 28* 23  CREATININE 1.03 0.94  CALCIUM 8.9 8.6*  MG  --  2.1  PHOS  --  3.2   Liver Function Tests: Recent Labs  Lab 05/17/20 0513  AST 23  ALT 22  ALKPHOS 48  BILITOT 0.7  PROT 7.0  ALBUMIN 3.7   No results for input(s): LIPASE, AMYLASE in the last 168 hours. No results for input(s): AMMONIA in the last 168 hours. CBC: Recent Labs  Lab 05/16/20 1507 05/17/20 0513  WBC 9.6 10.9*  HGB 14.9 13.5  HCT 45.8 41.0  MCV 98.3 97.4  PLT 194 170   Cardiac Enzymes: No results for input(s): CKTOTAL, CKMB, CKMBINDEX, TROPONINI in the last 168 hours. BNP: Invalid input(s): POCBNP CBG: Recent Labs   Lab 05/17/20 0752 05/17/20 1136 05/17/20 1606 05/17/20 1957 05/18/20 0804  GLUCAP 204* 233* 169* 184* 164*   D-Dimer Recent Labs    05/18/20 0326  DDIMER 2.48*   Hgb A1c Recent Labs    05/16/20 1507  HGBA1C 8.6*   Lipid Profile No results for input(s): CHOL, HDL, LDLCALC, TRIG, CHOLHDL, LDLDIRECT in the last 72 hours. Thyroid function studies No results for input(s): TSH, T4TOTAL, T3FREE, THYROIDAB in the last 72 hours.  Invalid input(s): FREET3 Anemia work up [employer]    05/18/20 0326  FERRITIN 175   Urinalysis No results found for: COLORURINE, APPEARANCEUR, LABSPEC, PHURINE, GLUCOSEU, HGBUR, BILIRUBINUR, KETONESUR, PROTEINUR, UROBILINOGEN, NITRITE, LEUKOCYTESUR Sepsis Labs Invalid input(s): PROCALCITONIN,  WBC,  LACTICIDVEN Microbiology Recent Results (from the past 240 hour(s))  Resp Panel by RT-PCR (Flu A&B, Covid) Nasopharyngeal Swab     Status: Abnormal   Collection Time: 05/16/20  9:42 PM   Specimen: Nasopharyngeal Swab; Nasopharyngeal(NP) swabs in vial transport medium  Result Value Ref Range Status   SARS Coronavirus 2 by RT PCR POSITIVE (A) NEGATIVE Final    Comment: RESULT CALLED TO, READ BACK BY AND VERIFIED WITH: WALKER,T @ 2239 ON 05/16/20 BY JUW (NOTE) SARS-CoV-2 target nucleic acids are DETECTED.  The SARS-CoV-2 RNA is generally detectable in upper respiratory specimens  during the acute phase of infection. Positive results are indicative of the presence of the identified virus, but do not rule out bacterial infection or co-infection with other pathogens not detected by the test. Clinical correlation with patient history and other diagnostic information is necessary to determine patient infection status. The expected result is Negative.  Fact Sheet for Patients: BloggerCourse.com  Fact Sheet for Healthcare Providers: SeriousBroker.it  This test is not yet approved or cleared by the [place] FDA and  has been authorized for detection and/or diagnosis of SARS-CoV-2 by FDA under an Emergency Use Authorization (EUA).  This EUA will remain in effect (meaning this test can be  used) for the duration of  the COVID-19 declaration under Section 564(b)(1) of the Act, 21 U.S.C. section 360bbb-3(b)(1), unless the authorization is terminated or revoked sooner.     Influenza A by PCR NEGATIVE NEGATIVE Final   Influenza B by PCR NEGATIVE NEGATIVE Final    Comment: (NOTE) The Xpert Xpress SARS-CoV-2/FLU/RSV plus assay is intended as an aid in the diagnosis of influenza from Nasopharyngeal swab specimens and should not be used as a sole basis for treatment. Nasal washings and aspirates are unacceptable for Xpert Xpress SARS-CoV-2/FLU/RSV testing.  Fact Sheet for Patients: BloggerCourse.com  Fact Sheet for Healthcare Providers: SeriousBroker.it  This test is not yet approved or cleared by the [place] FDA and has been authorized for detection and/or diagnosis of SARS-CoV-2 by FDA under an Emergency Use Authorization (EUA). This EUA will remain in effect (meaning this test can be used) for the duration of the COVID-19 declaration under Section 564(b)(1) of the Act, 21 U.S.C. section 360bbb-3(b)(1), unless the authorization is terminated or revoked.  Performed at [facility], [address], [place], [place] [address]   MRSA PCR Screening     Status: None   Collection Time: 05/17/20 12:50 AM   Specimen: Nasal Mucosa; Nasopharyngeal  Result Value Ref Range Status   MRSA by PCR NEGATIVE NEGATIVE Final    Comment:        The GeneXpert MRSA Assay (FDA approved for NASAL specimens only), is one component of a comprehensive MRSA colonization surveillance program. It is not intended to diagnose MRSA infection nor to guide or monitor treatment for MRSA infections. Performed at [facility], [address],  [place], [place] [address]      Time coordinating discharge: 35 minutes  SIGNED:   Erick Blinks, DO [facility] 05/18/2020, 10:21 AM  If 7PM-7AM, please contact night-coverage [email or web address]

## 2020-05-19 ENCOUNTER — Telehealth: Payer: Self-pay | Admitting: Family Medicine

## 2020-05-19 NOTE — Telephone Encounter (Signed)
Noted, await call from patient.

## 2020-05-19 NOTE — Telephone Encounter (Signed)
Pt is needing refill on isosorbide mononitrate (IMDUR) 30 MG 24 hr tablet [[id number]]   Pt is checking to see what pharmacy will deliver to him since he has Covid, will call back to let us know.

## 2020-05-22 NOTE — Telephone Encounter (Signed)
Patient had pcp call in his scripts.

## 2020-06-01 ENCOUNTER — Ambulatory Visit: Payer: Medicare PPO | Admitting: Family Medicine

## 2020-06-03 ENCOUNTER — Ambulatory Visit: Payer: Medicare PPO | Admitting: Family Medicine

## 2020-06-08 NOTE — Progress Notes (Addendum)
Cardiology Office Note  Date: 06/09/2020   ID: Frank Shelton, DOB April 16, 1961, MRN [id number]  PCP:  Vanessa [place], FNP  Cardiologist:  No primary care provider on file. Electrophysiologist:  None   Chief Complaint: Hospital follow-up  History of Present Illness: Frank Shelton is a 60 y.o. male with a history of obstructive sleep apnea, pulmonary embolism, hypothyroidism, hyperglycemia, COVID-19 virus infection, atypical chest pain, gout, DM2, CVA, HTN, dyspnea, PVD, CAD/MI.  Recent presentation to [facility] emergency room for 1 day onset of chest pain.  He was admitted 05/16/2020 with atypical chest pain with concern for some unstable angina as well.  Case was discussed with cardiology.  Plan was for stress test.  He was noted to be Covid positive therefore decision made to do stress test outpatient.  Troponins remained negative overnight..  2D echocardiogram showed no wall motion abnormalities with EF of 70 to 75%.  He was discharged and was to quarantine at home during Covid infection.  Troponin levels were negative x4.  D-dimer was 2.48.  CT of the chest showed no evidence of PE but did show hepatic steatosis and aortic atherosclerosis.  Glucose was elevated at 226.   He is here for hospital follow-up.  States he is feeling much better since he recovered from the Covid virus infection.  States he still having some twinges of chest pain and some shortness of breath.  Denies any orthostatic symptoms, CVA or TIA-like symptoms, PND, orthopnea, bleeding issues, claudication-like symptoms, DVT or PE-like symptoms.  He does have some chronic lower extremity and and venous stasis changes in both legs and has had previous intervention for venous insufficiency.  We discussed suggestion that he have an outpatient Lexiscan stress test to rule out ischemia as a contributor to his chest pain.  Past Medical History:  Diagnosis Date  . Arthritis    L shoulder & fingers   . Bronchitis    during  enviromental changes   . Complication of anesthesia    Report of airway problem  . Diabetes mellitus without complication (HCC)   . Dyspnea   . Embolism - blood clot   . Gout   . Headache    uses ibuprofen & tylenol & its relieved   . Hypertension   . Hypothyroidism   . Myocardial infarction (HCC) 2012  . OSA on CPAP   . Peripheral vascular disease (HCC)   . Pulmonary embolism (HCC)   . Stroke [facility]) 2014    Past Surgical History:  Procedure Laterality Date  . HELLER MYOTOMY  2014   [employer]  . IVC FILTER INSERTION  2010  . ROTATOR CUFF REPAIR Right   . SKIN GRAFT     x3- to face., child   . THYROIDECTOMY N/A 03/09/2017   Procedure: TOTAL THYROIDECTOMY;  Surgeon: Darnell Level, MD;  Location: [facility] OR;  Service: General;  Laterality: N/A;  . TOTAL THYROIDECTOMY  03/09/2017    Current Outpatient Medications  Medication Sig Dispense Refill  . acetaminophen (TYLENOL) 325 MG tablet Take 650 mg by mouth every 6 (six) hours as needed for moderate pain or headache.    . albuterol (VENTOLIN HFA) 108 (90 Base) MCG/ACT inhaler Inhale 2 puffs into the lungs every 4 (four) hours as needed for wheezing or shortness of breath. 8 g 2  . allopurinol (ZYLOPRIM) 300 MG tablet Take 300 mg by mouth daily.    Marland Kitchen aspirin EC 81 MG tablet Take 81 mg by mouth daily. Swallow  whole.    . atorvastatin (LIPITOR) 20 MG tablet Take 20 mg by mouth daily.    . budesonide-formoterol (SYMBICORT) 160-4.5 MCG/ACT inhaler Inhale 2 puffs into the lungs 2 (two) times daily as needed (for shortness of breath).     . calcium carbonate (TUMS) 500 MG chewable tablet Chew 2 tablets (400 mg of elemental calcium total) by mouth 4 (four) times daily. 120 tablet 1  . calcium-vitamin D (OSCAL 500/200 D-3) 500-200 MG-UNIT tablet Take 1 tablet by mouth 2 (two) times daily. 60 tablet 1  . colchicine 0.6 MG tablet Take 0.6 mg by mouth daily as needed (for gout flare/pain).     . DULoxetine (CYMBALTA) 30 MG capsule Take 30 mg by  mouth 2 (two) times daily.    Marland Kitchen ibuprofen (ADVIL,MOTRIN) 800 MG tablet Take 800 mg by mouth every 8 (eight) hours as needed for headache or moderate pain.     . isosorbide mononitrate (IMDUR) 30 MG 24 hr tablet Take 30 mg by mouth every day 90 tablet 0  . levothyroxine (SYNTHROID, LEVOTHROID) 200 MCG tablet TAKE 1 TABLET EVERY DAY (Patient taking differently: Take 200 mcg by mouth daily before breakfast.) 90 tablet 1  . metFORMIN (GLUCOPHAGE-XR) 500 MG 24 hr tablet Take 500 mg by mouth 2 (two) times daily.    . Multiple Vitamin (MULTIVITAMIN WITH MINERALS) TABS tablet Take 1 tablet by mouth daily.    . nebivolol (BYSTOLIC) 5 MG tablet Take 5 mg by mouth daily.    . Semaglutide, 1 MG/DOSE, (OZEMPIC, 1 MG/DOSE,) 4 MG/3ML SOPN Inject into the skin once a week.    . vitamin C (ASCORBIC ACID) 500 MG tablet Take 500 mg by mouth daily.    Carlena Hurl 20 MG TABS tablet TAKE 1 TABLET EVERY DAY (Patient taking differently: Take 20 mg by mouth daily with supper.) 30 tablet 0  . cetirizine (ZYRTEC) 10 MG tablet Take 10 mg by mouth daily as needed for allergies.    . Cholecalciferol (VITAMIN D3) 5000 units CAPS Take 5,000 Units by mouth 2 (two) times daily.    . Cyanocobalamin (B-12) 5000 MCG CAPS Take 5,000 mcg by mouth daily.     . furosemide (LASIX) 20 MG tablet Take 20 mg by mouth daily as needed.     Marland Kitchen HYDROcodone-acetaminophen (NORCO/VICODIN) 5-325 MG tablet Take 1-2 tablets by mouth every 4 (four) hours as needed for moderate pain. 20 tablet 0   No current facility-administered medications for this visit.   Allergies:  Nabumetone   Social History: The patient  reports that he quit smoking about 14 years ago. He started smoking about 50 years ago. He has a 30.00 pack-year smoking history. He has quit using smokeless tobacco. He reports current alcohol use. He reports that he does not use drugs.   Family History: The patient's family history includes Heart attack in his maternal grandfather, maternal  grandmother, and mother; Heart attack (age of onset: 56) in his sister.   ROS:  Please see the history of present illness. Otherwise, complete review of systems is positive for none.  All other systems are reviewed and negative.   Physical Exam: VS:  Ht 6\' 4"  (1.93 m)   Wt (!) 370 lb (167.8 kg)   BMI 45.04 kg/m , BMI Body mass index is 45.04 kg/m.  Wt Readings from Last 3 Encounters:  06/09/20 (!) 370 lb (167.8 kg)  05/16/20 (!) 320 lb (145.2 kg)  06/22/17 (!) 382 lb (173.3 kg)    General:  Patient appears comfortable at rest. HEENT: Conjunctiva and lids normal, oropharynx clear with moist mucosa. Neck: Supple, no elevated JVP or carotid bruits, no thyromegaly. Lungs: Clear to auscultation, nonlabored breathing at rest. Cardiac: Regular rate and rhythm, no S3 or significant systolic murmur, no pericardial rub. Abdomen: Soft, nontender, no hepatomegaly, bowel sounds present, no guarding or rebound. Extremities: No pitting edema, distal pulses 2+. Skin: Warm and dry. Musculoskeletal: No kyphosis. Neuropsychiatric: Alert and oriented x3, affect grossly appropriate.  ECG:    Recent Labwork: 05/17/2020: ALT 22; AST 23; BUN 23; Creatinine, Ser 0.94; Hemoglobin 13.5; Magnesium 2.1; Platelets 170; Potassium 3.7; Sodium 137     Component Value Date/Time   CHOL 91 01/10/2014 0443   TRIG 193 (H) 01/10/2014 0443   HDL 25 (L) 01/10/2014 0443   CHOLHDL 3.6 01/10/2014 0443   VLDL 39 01/10/2014 0443   LDLCALC 27 01/10/2014 0443    Other Studies Reviewed Today:  Echocardiogram 05/17/2020 IMPRESSIONS  1. Technically difficult; no parasternal views; LV function appears to be  normal.  2. Left ventricular ejection fraction, by estimation, is 70 to 75%. The  left ventricle has hyperdynamic function. The left ventricle has no  regional wall motion abnormalities. Left ventricular diastolic parameters  are consistent with Grade I diastolic  dysfunction (impaired relaxation).  3. Right  ventricular systolic function is normal. The right ventricular  size is normal.  4. The mitral valve is normal in structure. No evidence of mitral valve  regurgitation. No evidence of mitral stenosis.  5. The aortic valve was not well visualized. Aortic valve regurgitation  is not visualized. No aortic stenosis is present.   FINDINGS  Left Ventricle: Left ventricular ejection fraction, by estimation, is 70  to 75%. The left ventricle has hyperdynamic function. The left ventricle  has no regional wall motion abnormalities. The left ventricular internal  cavity size was normal in size.  There is no left ventricular hypertrophy. Left ventricular diastolic  parameters are consistent with Grade I diastolic dysfunction (impaired  relaxation).    Assessment and Plan:  1. Chest pain, unspecified type   2. Essential hypertension   3. PULMONARY EMBOLISM, HX OF    1. Chest pain, unspecified type Recent hospital visit with chest pain and ultimate diagnosis of Covid infection.  He quarantined at home.  it was suggested that he have an outpatient stress test to rule out an ischemic cause for his chest pain.  States he continues to have occasional twinges of chest pain.  He is in agreement that Lexiscan stress test would be a good idea to rule out ischemia.  Please get a Lexiscan stress test.  Continue aspirin 81 mg daily.  Continue Imdur 30 mg daily.  Continue atorvastatin 20 mg daily.  2. Essential hypertension Pressure is well controlled today with blood pressure today of 122/74.  Continue  Bystolic 5 mg daily.  Continue Lasix 20 mg daily as needed.  3. 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  Continue Xarelto 20 mg daily.  Medication Adjustments/Labs and Tests Ordered: Current medicines are reviewed at length with the patient today.  Concerns regarding medicines are outlined above.   Disposition: Follow-up with Dr. Diona Browner or APP 4  weeks  Signed, Rennis Harding, NP 06/09/2020 3:02 PM    [facility] [place] [address] [place], [place], [place] [address] Phone: [phone]; Fax: [phone]

## 2020-06-09 ENCOUNTER — Other Ambulatory Visit: Payer: Self-pay

## 2020-06-09 ENCOUNTER — Encounter: Payer: Self-pay | Admitting: Family Medicine

## 2020-06-09 ENCOUNTER — Ambulatory Visit (INDEPENDENT_AMBULATORY_CARE_PROVIDER_SITE_OTHER): Payer: Medicare PPO | Admitting: Family Medicine

## 2020-06-09 ENCOUNTER — Encounter: Payer: Self-pay | Admitting: *Deleted

## 2020-06-09 ENCOUNTER — Telehealth: Payer: Self-pay | Admitting: Family Medicine

## 2020-06-09 VITALS — BP 122/74 | HR 70 | Ht 76.0 in | Wt 370.0 lb

## 2020-06-09 DIAGNOSIS — R079 Chest pain, unspecified: Secondary | ICD-10-CM | POA: Diagnosis not present

## 2020-06-09 DIAGNOSIS — I10 Essential (primary) hypertension: Secondary | ICD-10-CM

## 2020-06-09 DIAGNOSIS — Z86718 Personal history of other venous thrombosis and embolism: Secondary | ICD-10-CM | POA: Diagnosis not present

## 2020-06-09 NOTE — Telephone Encounter (Signed)
Pre-cert Verification for the following procedure    LEXISCVAN MYOVIEW   DATE:06/11/2020  LOCATION: [facility]

## 2020-06-09 NOTE — Patient Instructions (Signed)
Medication Instructions:  Continue all current medications.  Labwork: none  Testing/Procedures:  Your physician has requested that you have a lexiscan myoview. For further information please visit [email or web address]. Please follow instruction sheet, as given.  Office will contact with results via phone or letter.    Follow-Up: 4 weeks   Any Other Special Instructions Will Be Listed Below (If Applicable).  If you need a refill on your cardiac medications before your next appointment, please call your pharmacy.  

## 2020-06-10 NOTE — Addendum Note (Signed)
Addended by: Burman Nieves T on: 06/10/2020 11:42 AM   Modules accepted: Orders

## 2020-06-11 ENCOUNTER — Encounter (HOSPITAL_COMMUNITY): Payer: Medicare PPO

## 2020-06-11 ENCOUNTER — Ambulatory Visit (HOSPITAL_COMMUNITY): Payer: Medicare PPO

## 2020-06-22 ENCOUNTER — Telehealth: Payer: Self-pay

## 2020-06-22 ENCOUNTER — Other Ambulatory Visit: Payer: Self-pay

## 2020-06-22 ENCOUNTER — Encounter (HOSPITAL_BASED_OUTPATIENT_CLINIC_OR_DEPARTMENT_OTHER)
Admission: RE | Admit: 2020-06-22 | Discharge: 2020-06-22 | Disposition: A | Discharge status: Home / Self Care | Payer: Medicare PPO | Source: Ambulatory Visit | Attending: Family Medicine | Admitting: Family Medicine

## 2020-06-22 ENCOUNTER — Encounter (HOSPITAL_COMMUNITY)
Admission: RE | Admit: 2020-06-22 | Discharge: 2020-06-22 | Disposition: A | Discharge status: Home / Self Care | Payer: Medicare PPO | Source: Ambulatory Visit | Attending: Family Medicine | Admitting: Family Medicine

## 2020-06-22 DIAGNOSIS — R079 Chest pain, unspecified: Secondary | ICD-10-CM

## 2020-06-22 LAB — NM MYOCAR MULTI W/SPECT W/WALL MOTION / EF
LV dias vol: 132 mL (ref 62–150)
LV sys vol: 48 mL
Peak HR: 88 {beats}/min
RATE: 0.41
Rest HR: 63 {beats}/min
SDS: 0
SRS: 2
SSS: 2
TID: 1.06

## 2020-06-22 MED ORDER — TECHNETIUM TC 99M TETROFOSMIN IV KIT
30.0000 | PACK | Freq: Once | INTRAVENOUS | Status: AC | PRN
  Administered 2020-06-22: 32 via INTRAVENOUS

## 2020-06-22 MED ORDER — REGADENOSON 0.4 MG/5ML IV SOLN
INTRAVENOUS | Status: AC
  Administered 2020-06-22: 0.4 mg via INTRAVENOUS
  Filled 2020-06-22: qty 5

## 2020-06-22 MED ORDER — TECHNETIUM TC 99M TETROFOSMIN IV KIT
10.0000 | PACK | Freq: Once | INTRAVENOUS | Status: AC | PRN
  Administered 2020-06-22: 10.8 via INTRAVENOUS

## 2020-06-22 MED ORDER — SODIUM CHLORIDE FLUSH 0.9 % IV SOLN
INTRAVENOUS | Status: AC
  Administered 2020-06-22: 10 mL via INTRAVENOUS
  Filled 2020-06-22: qty 10

## 2020-06-22 NOTE — Telephone Encounter (Signed)
-----   Message from Netta Neat., NP sent at 06/22/2020  3:51 PM EST ----- Please call the patient and let him know the stress test showed findings that were consistent with prior heart attack with some mildly decreased blood flow but the stress test in general was considered a low risk study by the interpreting physician.  No current major areas of lack of blood flow noted on the test.  Thank you

## 2020-06-22 NOTE — Telephone Encounter (Signed)
Patient contacted and verbalized understanding of results. Patient had no questions or concerns right now but was instructed to contact the office if he needed to.

## 2020-07-09 ENCOUNTER — Ambulatory Visit: Payer: Medicare PPO | Admitting: Family Medicine

## 2020-07-09 NOTE — Progress Notes (Deleted)
Cardiology Office Note  Date: 07/09/2020   ID: Frank Hellmer., DOB 1961-03-24, MRN [id number]  PCP:  Vanessa , FNP  Cardiologist:  No primary care provider on file. Electrophysiologist:  None   Chief Complaint: Hospital follow-up  History of Present Illness: Frank Shelton. is a 60 y.o. male with a history of obstructive sleep apnea, pulmonary embolism, hypothyroidism, hyperglycemia, COVID-19 virus infection, atypical chest pain, gout, DM2, CVA, HTN, dyspnea, PVD, CAD/MI.  Recent presentation to [facility] emergency room for 1 day onset of chest pain.  He was admitted 05/16/2020 with atypical chest pain with concern for some unstable angina as well.  Case was discussed with cardiology.  Plan was for stress test.  He was noted to be Covid positive therefore decision made to do stress test outpatient.  Troponins remained negative overnight..  2D echocardiogram showed no wall motion abnormalities with EF of 70 to 75%.  He was discharged and was to quarantine at home during Covid infection.  Troponin levels were negative x4.  D-dimer was 2.48.  CT of the chest showed no evidence of PE but did show hepatic steatosis and aortic atherosclerosis.  Glucose was elevated at 226.   He is here for hospital follow-up.  States he is feeling much better since he recovered from the Covid virus infection.  States he still having some twinges of chest pain and some shortness of breath.  Denies any orthostatic symptoms, CVA or TIA-like symptoms, PND, orthopnea, bleeding issues, claudication-like symptoms, DVT or PE-like symptoms.  He does have some chronic lower extremity and and venous stasis changes in both legs and has had previous intervention for venous insufficiency.  We discussed suggestion that he have an outpatient Lexiscan stress test to rule out ischemia as a contributor to his chest pain.  Past Medical History:  Diagnosis Date  . Arthritis    L shoulder & fingers   . Bronchitis     during enviromental changes   . Complication of anesthesia    Report of airway problem  . Diabetes mellitus without complication (HCC)   . Dyspnea   . Embolism - blood clot   . Gout   . Headache    uses ibuprofen & tylenol & its relieved   . Hypertension   . Hypothyroidism   . Myocardial infarction (HCC) 2012  . OSA on CPAP   . Peripheral vascular disease (HCC)   . Pulmonary embolism (HCC)   . Stroke [facility]) 2014    Past Surgical History:  Procedure Laterality Date  . HELLER MYOTOMY  2014   [employer]  . IVC FILTER INSERTION  2010  . ROTATOR CUFF REPAIR Right   . SKIN GRAFT     x3- to face., child   . THYROIDECTOMY N/A 03/09/2017   Procedure: TOTAL THYROIDECTOMY;  Surgeon: Darnell Level, MD;  Location: [facility] OR;  Service: General;  Laterality: N/A;  . TOTAL THYROIDECTOMY  03/09/2017    Current Outpatient Medications  Medication Sig Dispense Refill  . acetaminophen (TYLENOL) 325 MG tablet Take 650 mg by mouth every 6 (six) hours as needed for moderate pain or headache.    . albuterol (VENTOLIN HFA) 108 (90 Base) MCG/ACT inhaler Inhale 2 puffs into the lungs every 4 (four) hours as needed for wheezing or shortness of breath. 8 g 2  . allopurinol (ZYLOPRIM) 300 MG tablet Take 300 mg by mouth daily.    Marland Kitchen aspirin EC 81 MG tablet Take 81 mg by mouth  daily. Swallow whole.    Marland Kitchen atorvastatin (LIPITOR) 20 MG tablet Take 20 mg by mouth daily.    . budesonide-formoterol (SYMBICORT) 160-4.5 MCG/ACT inhaler Inhale 2 puffs into the lungs 2 (two) times daily as needed (for shortness of breath).     . calcium carbonate (TUMS) 500 MG chewable tablet Chew 2 tablets (400 mg of elemental calcium total) by mouth 4 (four) times daily. 120 tablet 1  . calcium-vitamin D (OSCAL 500/200 D-3) 500-200 MG-UNIT tablet Take 1 tablet by mouth 2 (two) times daily. 60 tablet 1  . cetirizine (ZYRTEC) 10 MG tablet Take 10 mg by mouth daily as needed for allergies.    . Cholecalciferol (VITAMIN D3) 5000 units CAPS  Take 5,000 Units by mouth 2 (two) times daily.    . colchicine 0.6 MG tablet Take 0.6 mg by mouth daily as needed (for gout flare/pain).     . Cyanocobalamin (B-12) 5000 MCG CAPS Take 5,000 mcg by mouth daily.     . DULoxetine (CYMBALTA) 30 MG capsule Take 30 mg by mouth 2 (two) times daily.    . furosemide (LASIX) 20 MG tablet Take 20 mg by mouth daily as needed.     Marland Kitchen ibuprofen (ADVIL,MOTRIN) 800 MG tablet Take 800 mg by mouth every 8 (eight) hours as needed for headache or moderate pain.     . isosorbide mononitrate (IMDUR) 30 MG 24 hr tablet Take 30 mg by mouth every day 90 tablet 0  . levothyroxine (SYNTHROID, LEVOTHROID) 200 MCG tablet TAKE 1 TABLET EVERY DAY (Patient taking differently: Take 200 mcg by mouth daily before breakfast.) 90 tablet 1  . metFORMIN (GLUCOPHAGE-XR) 500 MG 24 hr tablet Take 500 mg by mouth 2 (two) times daily.    . Multiple Vitamin (MULTIVITAMIN WITH MINERALS) TABS tablet Take 1 tablet by mouth daily.    . nebivolol (BYSTOLIC) 5 MG tablet Take 5 mg by mouth daily.    . Semaglutide, 1 MG/DOSE, (OZEMPIC, 1 MG/DOSE,) 4 MG/3ML SOPN Inject into the skin once a week.    . vitamin C (ASCORBIC ACID) 500 MG tablet Take 500 mg by mouth daily.    Carlena Hurl 20 MG TABS tablet TAKE 1 TABLET EVERY DAY (Patient taking differently: Take 20 mg by mouth daily with supper.) 30 tablet 0   No current facility-administered medications for this visit.   Allergies:  Nabumetone   Social History: The patient  reports that he quit smoking about 14 years ago. He started smoking about 50 years ago. He has a 30.00 pack-year smoking history. He has quit using smokeless tobacco. He reports current alcohol use. He reports that he does not use drugs.   Family History: The patient's family history includes Heart attack in his maternal grandfather, maternal grandmother, and mother; Heart attack (age of onset: 65) in his sister.   ROS:  Please see the history of present illness. Otherwise, complete  review of systems is positive for none.  All other systems are reviewed and negative.   Physical Exam: VS:  There were no vitals taken for this visit., BMI There is no height or weight on file to calculate BMI.  Wt Readings from Last 3 Encounters:  06/09/20 (!) 370 lb (167.8 kg)  05/16/20 (!) 320 lb (145.2 kg)  06/22/17 (!) 382 lb (173.3 kg)    General: Patient appears comfortable at rest. HEENT: Conjunctiva and lids normal, oropharynx clear with moist mucosa. Neck: Supple, no elevated JVP or carotid bruits, no thyromegaly. Lungs: Clear to  auscultation, nonlabored breathing at rest. Cardiac: Regular rate and rhythm, no S3 or significant systolic murmur, no pericardial rub. Abdomen: Soft, nontender, no hepatomegaly, bowel sounds present, no guarding or rebound. Extremities: No pitting edema, distal pulses 2+. Skin: Warm and dry. Musculoskeletal: No kyphosis. Neuropsychiatric: Alert and oriented x3, affect grossly appropriate.  ECG:    Recent Labwork: 05/17/2020: ALT 22; AST 23; BUN 23; Creatinine, Ser 0.94; Hemoglobin 13.5; Magnesium 2.1; Platelets 170; Potassium 3.7; Sodium 137     Component Value Date/Time   CHOL 91 01/10/2014 0443   TRIG 193 (H) 01/10/2014 0443   HDL 25 (L) 01/10/2014 0443   CHOLHDL 3.6 01/10/2014 0443   VLDL 39 01/10/2014 0443   LDLCALC 27 01/10/2014 0443    Other Studies Reviewed Today:   NST 06/22/2020 Study Result  Narrative & Impression   There was no ST segment deviation noted during stress.  Findings consistent with prior inferior/inferoseptal/inferoapical myocardial infarction with mild peri-infarct ischemia.  This is a low risk study.  The left ventricular ejection fraction is normal (55-65%).       Echocardiogram 05/17/2020 IMPRESSIONS  1. Technically difficult; no parasternal views; LV function appears to be  normal.  2. Left ventricular ejection fraction, by estimation, is 70 to 75%. The  left ventricle has hyperdynamic function.  The left ventricle has no  regional wall motion abnormalities. Left ventricular diastolic parameters  are consistent with Grade I diastolic  dysfunction (impaired relaxation).  3. Right ventricular systolic function is normal. The right ventricular  size is normal.  4. The mitral valve is normal in structure. No evidence of mitral valve  regurgitation. No evidence of mitral stenosis.  5. The aortic valve was not well visualized. Aortic valve regurgitation  is not visualized. No aortic stenosis is present.   FINDINGS  Left Ventricle: Left ventricular ejection fraction, by estimation, is 70  to 75%. The left ventricle has hyperdynamic function. The left ventricle  has no regional wall motion abnormalities. The left ventricular internal  cavity size was normal in size.  There is no left ventricular hypertrophy. Left ventricular diastolic  parameters are consistent with Grade I diastolic dysfunction (impaired  relaxation).    Assessment and Plan:   1. Chest pain, unspecified type Recent hospital visit with chest pain and ultimate diagnosis of Covid infection.  He quarantined at home.  it was suggested that he have an outpatient stress test to rule out an ischemic cause for his chest pain.  States he continues to have occasional twinges of chest pain.  He is in agreement that Lexiscan stress test would be a good idea to rule out ischemia.  Please get a Lexiscan stress test.  Continue aspirin 81 mg daily.  Continue Imdur 30 mg daily.  Continue atorvastatin 20 mg daily.  2. Essential hypertension Pressure is well controlled today with blood pressure today of 122/74.  Continue continuing Bystolic 5 mg daily.  Continue Lasix 20 mg daily as needed.  3. 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  Continue Xarelto 20 mg daily.  Medication Adjustments/Labs and Tests Ordered: Current medicines are reviewed at length with the patient today.   Concerns regarding medicines are outlined above.   Disposition: Follow-up with Dr. Diona Browner or APP 4 weeks  Signed, Rennis Harding, NP 07/09/2020 12:25 AM    [facility] [address] [place], [place], [place] [address] Phone: [phone]; Fax: [phone]

## 2020-07-19 NOTE — Progress Notes (Signed)
Cardiology Office Note  Date: 07/20/2020   ID: Frank Ricks., DOB 06-06-1960, MRN [id number]  PCP:  Frank [place], FNP  Cardiologist:  No primary care provider on file. Electrophysiologist:  None   Chief Complaint: Hospital follow-up  History of Present Illness: Frank Shelton. is a 60 y.o. male with a history of obstructive sleep apnea, pulmonary embolism, hypothyroidism, hyperglycemia, COVID-19 virus infection, atypical chest pain, gout, DM2, CVA, HTN, dyspnea, PVD, CAD/MI.  Recent presentation to [facility] emergency room for 1 day onset of chest pain.  He was admitted 05/16/2020 with atypical chest pain with concern for some unstable angina as well.  Case was discussed with cardiology.  Plan was for stress test.  He was noted to be Covid positive therefore decision made to do stress test outpatient.  Troponins remained negative overnight..  2D echocardiogram showed no wall motion abnormalities with EF of 70 to 75%.  He was discharged and was to quarantine at home during Covid infection.  Troponin levels were negative x4.  D-dimer was 2.48.  CT of the chest showed no evidence of PE but did show hepatic steatosis and aortic atherosclerosis.  Glucose was elevated at 226.   He is here for follow-up for recent Lexiscan stress test to rule out ischemia as a cause of his previous chest pain during recent hospital stay.  Lexiscan stress test was negative for ischemia and considered a low risk study per interpreting physician.  He denies further anginal or exertional symptoms, orthostatic symptoms, CVA or TIA-like symptoms, palpitations or arrhythmias, PND, orthopnea.  No bleeding issues.  No claudication-like symptoms, DVT or PE-like symptoms, or lower extremity edema.  Past Medical History:  Diagnosis Date  . Arthritis    L shoulder & fingers   . Bronchitis    during enviromental changes   . Complication of anesthesia    Report of airway problem  . Diabetes mellitus without  complication (HCC)   . Dyspnea   . Embolism - blood clot   . Gout   . Headache    uses ibuprofen & tylenol & its relieved   . Hypertension   . Hypothyroidism   . Myocardial infarction (HCC) 2012  . OSA on CPAP   . Peripheral vascular disease (HCC)   . Pulmonary embolism (HCC)   . Stroke [facility]) 2014    Past Surgical History:  Procedure Laterality Date  . HELLER MYOTOMY  2014   [employer]  . IVC FILTER INSERTION  2010  . ROTATOR CUFF REPAIR Right   . SKIN GRAFT     x3- to face., child   . THYROIDECTOMY N/A 03/09/2017   Procedure: TOTAL THYROIDECTOMY;  Surgeon: Darnell Level, MD;  Location: [facility] OR;  Service: General;  Laterality: N/A;  . TOTAL THYROIDECTOMY  03/09/2017    Current Outpatient Medications  Medication Sig Dispense Refill  . acetaminophen (TYLENOL) 325 MG tablet Take 650 mg by mouth every 6 (six) hours as needed for moderate pain or headache.    . albuterol (VENTOLIN HFA) 108 (90 Base) MCG/ACT inhaler Inhale 2 puffs into the lungs every 4 (four) hours as needed for wheezing or shortness of breath. 8 g 2  . allopurinol (ZYLOPRIM) 300 MG tablet Take 300 mg by mouth daily.    Marland Kitchen aspirin EC 81 MG tablet Take 81 mg by mouth daily. Swallow whole.    Marland Kitchen atorvastatin (LIPITOR) 20 MG tablet Take 20 mg by mouth daily.    . budesonide-formoterol (SYMBICORT)  160-4.5 MCG/ACT inhaler Inhale 2 puffs into the lungs 2 (two) times daily as needed (for shortness of breath).     . calcium carbonate (TUMS) 500 MG chewable tablet Chew 2 tablets (400 mg of elemental calcium total) by mouth 4 (four) times daily. 120 tablet 1  . calcium-vitamin D (OSCAL 500/200 D-3) 500-200 MG-UNIT tablet Take 1 tablet by mouth 2 (two) times daily. 60 tablet 1  . cetirizine (ZYRTEC) 10 MG tablet Take 10 mg by mouth daily as needed for allergies.    . Cholecalciferol (VITAMIN D3) 5000 units CAPS Take 5,000 Units by mouth 2 (two) times daily.    . colchicine 0.6 MG tablet Take 0.6 mg by mouth daily as needed (for  gout flare/pain).     . Cyanocobalamin (B-12) 5000 MCG CAPS Take 5,000 mcg by mouth daily.     . DULoxetine (CYMBALTA) 30 MG capsule Take 30 mg by mouth 2 (two) times daily.    . furosemide (LASIX) 20 MG tablet Take 20 mg by mouth daily as needed.     Marland Kitchen ibuprofen (ADVIL,MOTRIN) 800 MG tablet Take 800 mg by mouth every 8 (eight) hours as needed for headache or moderate pain.     . isosorbide mononitrate (IMDUR) 30 MG 24 hr tablet Take 30 mg by mouth every day 90 tablet 0  . levothyroxine (SYNTHROID, LEVOTHROID) 200 MCG tablet TAKE 1 TABLET EVERY DAY (Patient taking differently: Take 200 mcg by mouth daily before breakfast.) 90 tablet 1  . metFORMIN (GLUCOPHAGE-XR) 500 MG 24 hr tablet Take 500 mg by mouth 2 (two) times daily.    . Multiple Vitamin (MULTIVITAMIN WITH MINERALS) TABS tablet Take 1 tablet by mouth daily.    . nebivolol (BYSTOLIC) 5 MG tablet Take 5 mg by mouth daily.    . Semaglutide, 1 MG/DOSE, (OZEMPIC, 1 MG/DOSE,) 4 MG/3ML SOPN Inject into the skin once a week.    . vitamin C (ASCORBIC ACID) 500 MG tablet Take 500 mg by mouth daily.    Carlena Hurl 20 MG TABS tablet TAKE 1 TABLET EVERY DAY (Patient taking differently: Take 20 mg by mouth daily with supper.) 30 tablet 0   No current facility-administered medications for this visit.   Allergies:  Nabumetone   Social History: The patient  reports that he quit smoking about 14 years ago. He started smoking about 50 years ago. He has a 30.00 pack-year smoking history. He has quit using smokeless tobacco. He reports current alcohol use. He reports that he does not use drugs.   Family History: The patient's family history includes Heart attack in his maternal grandfather, maternal grandmother, and mother; Heart attack (age of onset: 73) in his sister.   ROS:  Please see the history of present illness. Otherwise, complete review of systems is positive for none.  All other systems are reviewed and negative.   Physical Exam: VS:  BP 118/68    Pulse 61   Ht 6\' 4"  (1.93 m)   Wt (!) 362 lb 6.4 oz (164.4 kg)   SpO2 97%   BMI 44.11 kg/m , BMI Body mass index is 44.11 kg/m.  Wt Readings from Last 3 Encounters:  07/20/20 (!) 362 lb 6.4 oz (164.4 kg)  06/09/20 (!) 370 lb (167.8 kg)  05/16/20 (!) 320 lb (145.2 kg)    General: Obese patient appears comfortable at rest. Neck: Supple, no elevated JVP or carotid bruits, no thyromegaly. Lungs: Clear to auscultation, nonlabored breathing at rest. Cardiac: Regular rate and rhythm, no  S3 or significant systolic murmur, no pericardial rub. Extremities: No pitting edema, distal pulses 2+. Skin: Warm and dry. Musculoskeletal: No kyphosis. Neuropsychiatric: Alert and oriented x3, affect grossly appropriate.  ECG:    Recent Labwork: 05/17/2020: ALT 22; AST 23; BUN 23; Creatinine, Ser 0.94; Hemoglobin 13.5; Magnesium 2.1; Platelets 170; Potassium 3.7; Sodium 137     Component Value Date/Time   CHOL 91 01/10/2014 0443   TRIG 193 (H) 01/10/2014 0443   HDL 25 (L) 01/10/2014 0443   CHOLHDL 3.6 01/10/2014 0443   VLDL 39 01/10/2014 0443   LDLCALC 27 01/10/2014 0443    Other Studies Reviewed Today:   NST 06/22/2020 Study Result  Narrative & Impression   There was no ST segment deviation noted during stress.  Findings consistent with prior inferior/inferoseptal/inferoapical myocardial infarction with mild peri-infarct ischemia.  This is a low risk study.  The left ventricular ejection fraction is normal (55-65%).       Echocardiogram 05/17/2020 IMPRESSIONS  1. Technically difficult; no parasternal views; LV function appears to be  normal.  2. Left ventricular ejection fraction, by estimation, is 70 to 75%. The  left ventricle has hyperdynamic function. The left ventricle has no  regional wall motion abnormalities. Left ventricular diastolic parameters  are consistent with Grade I diastolic  dysfunction (impaired relaxation).  3. Right ventricular systolic function is  normal. The right ventricular  size is normal.  4. The mitral valve is normal in structure. No evidence of mitral valve  regurgitation. No evidence of mitral stenosis.  5. The aortic valve was not well visualized. Aortic valve regurgitation  is not visualized. No aortic stenosis is present.   FINDINGS  Left Ventricle: Left ventricular ejection fraction, by estimation, is 70  to 75%. The left ventricle has hyperdynamic function. The left ventricle  has no regional wall motion abnormalities. The left ventricular internal  cavity size was normal in size.  There is no left ventricular hypertrophy. Left ventricular diastolic  parameters are consistent with Grade I diastolic dysfunction (impaired  relaxation).    Assessment and Plan:   1. Chest pain, unspecified type Recent hospital visit with chest pain and ultimate diagnosis of Covid infection.  He quarantined at home. It was suggested that he have an outpatient stress test to rule out an ischemic cause for his chest pain.  Recent stress test demonstrated  prior MI with mild peri-infarct ischemia.  It was considered a low risk study by interpreting physician.  He denies any further chest pain since discharge from the hospital for Covid infection.  Continue aspirin 81 mg daily.  Continue Imdur 30 mg daily.  Continue atorvastatin 20 mg daily.  2. Essential hypertension Pressure is well controlled today with blood pressure today of 118/68.   Continue Bystolic 5 mg daily.  Continue Lasix 20 mg daily as needed.  He states he is recently lost around 8 pounds and feels better since losing weight.  3. 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  Continue Xarelto 20 mg daily.  No bleeding issues.  Medication Adjustments/Labs and Tests Ordered: Current medicines are reviewed at length with the patient today.  Concerns regarding medicines are outlined above.   Disposition: Follow-up with Dr.  Diona Browner or APP 1 year per patient request Signed, Rennis Harding, NP 07/20/2020 3:40 PM    [facility] [address] [place], [place], [place] [address] Phone: [phone]; Fax: [phone]

## 2020-07-20 ENCOUNTER — Encounter: Payer: Self-pay | Admitting: Family Medicine

## 2020-07-20 ENCOUNTER — Other Ambulatory Visit: Payer: Self-pay

## 2020-07-20 ENCOUNTER — Ambulatory Visit (INDEPENDENT_AMBULATORY_CARE_PROVIDER_SITE_OTHER): Payer: Medicare PPO | Admitting: Family Medicine

## 2020-07-20 VITALS — BP 118/68 | HR 61 | Ht 76.0 in | Wt 362.4 lb

## 2020-07-20 DIAGNOSIS — Z86711 Personal history of pulmonary embolism: Secondary | ICD-10-CM

## 2020-07-20 DIAGNOSIS — R079 Chest pain, unspecified: Secondary | ICD-10-CM

## 2020-07-20 DIAGNOSIS — I10 Essential (primary) hypertension: Secondary | ICD-10-CM

## 2020-07-20 NOTE — Patient Instructions (Signed)

## 2021-08-09 LAB — COLOGUARD: COLOGUARD: NEGATIVE

## 2021-11-08 ENCOUNTER — Emergency Department (HOSPITAL_COMMUNITY)
Admission: EM | Admit: 2021-11-08 | Discharge: 2021-11-08 | Disposition: A | Discharge status: Home / Self Care | Payer: Medicare HMO | Attending: Emergency Medicine | Admitting: Emergency Medicine

## 2021-11-08 ENCOUNTER — Emergency Department (HOSPITAL_COMMUNITY): Payer: Medicare HMO

## 2021-11-08 ENCOUNTER — Other Ambulatory Visit: Payer: Self-pay

## 2021-11-08 DIAGNOSIS — Z7901 Long term (current) use of anticoagulants: Secondary | ICD-10-CM | POA: Insufficient documentation

## 2021-11-08 DIAGNOSIS — N200 Calculus of kidney: Secondary | ICD-10-CM | POA: Insufficient documentation

## 2021-11-08 DIAGNOSIS — E039 Hypothyroidism, unspecified: Secondary | ICD-10-CM | POA: Diagnosis not present

## 2021-11-08 DIAGNOSIS — Z794 Long term (current) use of insulin: Secondary | ICD-10-CM | POA: Insufficient documentation

## 2021-11-08 DIAGNOSIS — E119 Type 2 diabetes mellitus without complications: Secondary | ICD-10-CM | POA: Insufficient documentation

## 2021-11-08 DIAGNOSIS — Z7982 Long term (current) use of aspirin: Secondary | ICD-10-CM | POA: Diagnosis not present

## 2021-11-08 DIAGNOSIS — Z79899 Other long term (current) drug therapy: Secondary | ICD-10-CM | POA: Diagnosis not present

## 2021-11-08 DIAGNOSIS — Z7984 Long term (current) use of oral hypoglycemic drugs: Secondary | ICD-10-CM | POA: Diagnosis not present

## 2021-11-08 DIAGNOSIS — R1031 Right lower quadrant pain: Secondary | ICD-10-CM | POA: Diagnosis present

## 2021-11-08 LAB — COMPREHENSIVE METABOLIC PANEL
ALT: 21 U/L (ref 0–44)
AST: 20 U/L (ref 15–41)
Albumin: 3.8 g/dL (ref 3.5–5.0)
Alkaline Phosphatase: 54 U/L (ref 38–126)
Anion gap: 9 (ref 5–15)
BUN: 17 mg/dL (ref 8–23)
CO2: 23 mmol/L (ref 22–32)
Calcium: 8.5 mg/dL — ABNORMAL LOW (ref 8.9–10.3)
Chloride: 104 mmol/L (ref 98–111)
Creatinine, Ser: 1 mg/dL (ref 0.61–1.24)
GFR, Estimated: >60 mL/min (ref >60)
Glucose, Bld: 128 mg/dL — ABNORMAL HIGH (ref 70–99)
Potassium: 3.9 mmol/L (ref 3.5–5.1)
Sodium: 136 mmol/L (ref 135–145)
Total Bilirubin: 0.5 mg/dL (ref 0.3–1.2)
Total Protein: 7.5 g/dL (ref 6.5–8.1)

## 2021-11-08 LAB — URINALYSIS, ROUTINE W REFLEX MICROSCOPIC
Bilirubin Urine: NEGATIVE
Glucose, UA: NEGATIVE mg/dL
Ketones, ur: NEGATIVE mg/dL
Nitrite: NEGATIVE
Protein, ur: NEGATIVE mg/dL
Specific Gravity, Urine: 1.011 (ref 1.005–1.030)
pH: 6 (ref 5.0–8.0)

## 2021-11-08 LAB — CBC
HCT: 45.4 % (ref 39.0–52.0)
Hemoglobin: 15 g/dL (ref 13.0–17.0)
MCH: 31.8 pg (ref 26.0–34.0)
MCHC: 33 g/dL (ref 30.0–36.0)
MCV: 96.2 fL (ref 80.0–100.0)
Platelets: 194 10*3/uL (ref 150–400)
RBC: 4.72 MIL/uL (ref 4.22–5.81)
RDW: 14.1 % (ref 11.5–15.5)
WBC: 10.1 10*3/uL (ref 4.0–10.5)
nRBC: 0 % (ref 0.0–0.2)

## 2021-11-08 LAB — LIPASE, BLOOD: Lipase: 37 U/L (ref 11–51)

## 2021-11-08 MED ORDER — METHOCARBAMOL 500 MG PO TABS: 500.0000 mg | ORAL_TABLET | Freq: Two times a day (BID) | ORAL | 0 refills | Status: DC | PRN

## 2021-11-08 MED ORDER — IOHEXOL 300 MG/ML  SOLN
100.0000 mL | Freq: Once | INTRAMUSCULAR | Status: AC | PRN
  Administered 2021-11-08: 100 mL via INTRAVENOUS

## 2021-11-08 NOTE — ED Triage Notes (Signed)
Patient coming to ED for evaluation of RLQ pain x 1 week.  Reports pain has been intermittent x 3 months.  Seen at [facility] and was instructed to come to ED for further evaluation.  No fevers.  No vomiting.  Has had nausea.

## 2022-02-04 IMAGING — CT CT ANGIO CHEST
2 of 6 series · 18 of 46 positions shown · IV contrast (Omnipaque or Isovue)
Comparison: January 09, 2014.

CLINICAL DATA: Chest pain.

EXAM:
CT ANGIOGRAPHY CHEST WITH CONTRAST
TECHNIQUE: Multidetector CT imaging of the chest was performed using the
standard protocol during bolus administration of intravenous
contrast. Multiplanar CT image reconstructions and MIPs were
obtained to evaluate the vascular anatomy.
CONTRAST:  80 mL OMNIPAQUE IOHEXOL 350 MG/ML SOLN

[Series 5: pe axial thins · axial · 0.92mm/px · z∈[-252,+41]mm · 15 of 321 slices shown]
[im 14/321  lung]
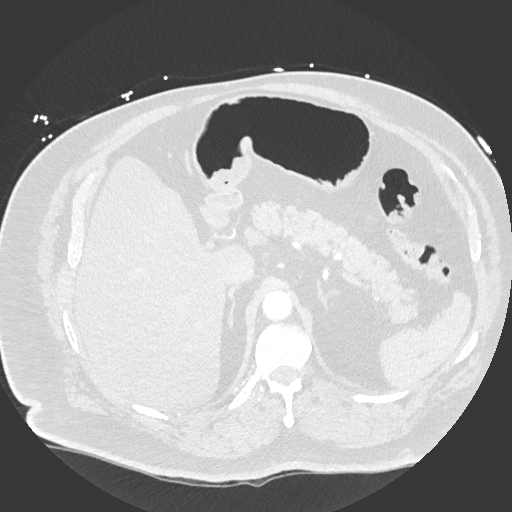
[im 42/321  soft-tissue]
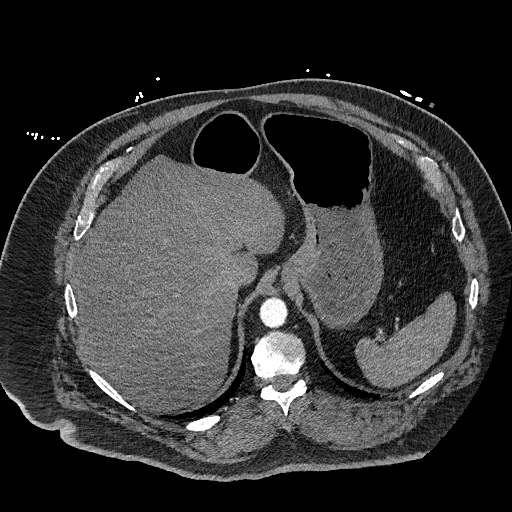
[im 56/321  lung]
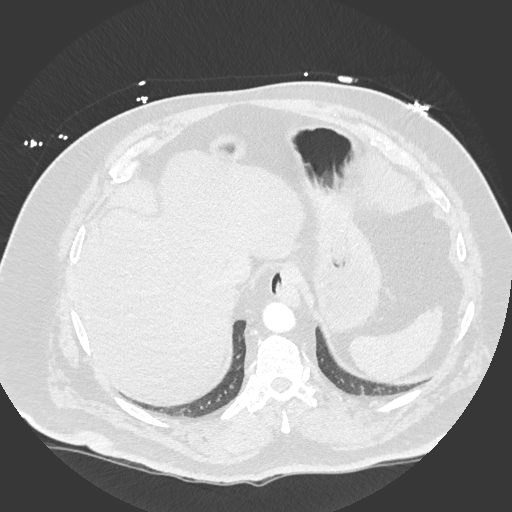
[im 84/321  soft-tissue]
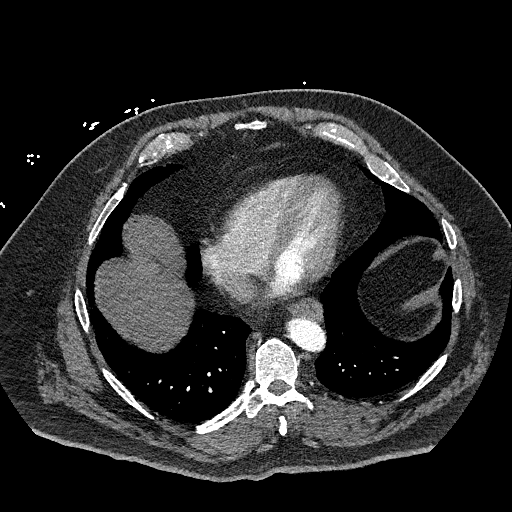
[im 98/321  lung]
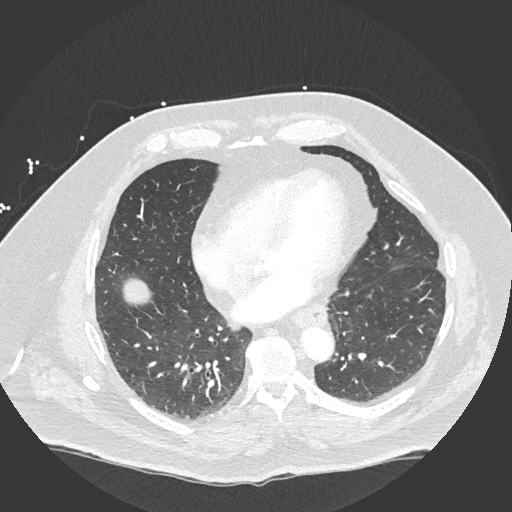
[im 126/321  soft-tissue]
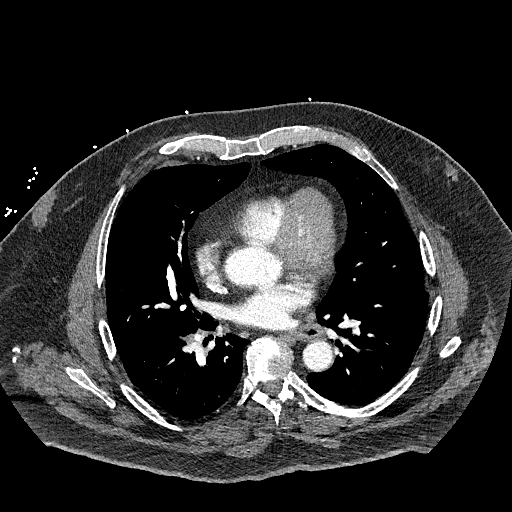
[im 140/321  lung]
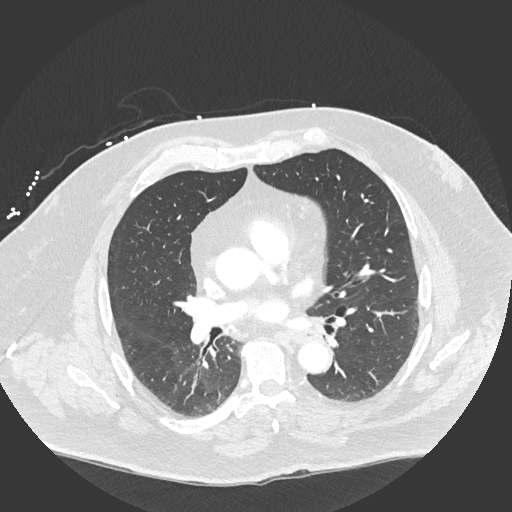
[im 167/321  soft-tissue]
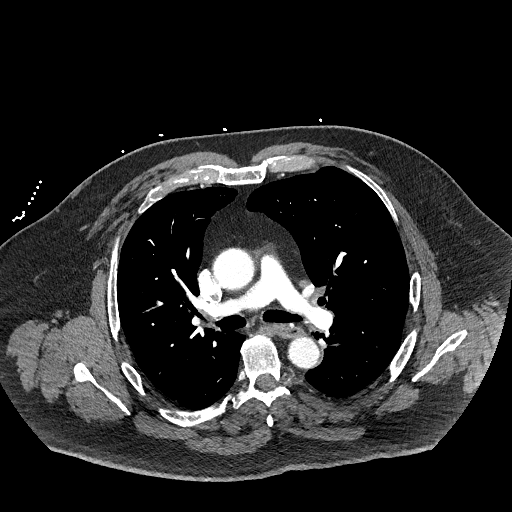
[im 181/321  lung]
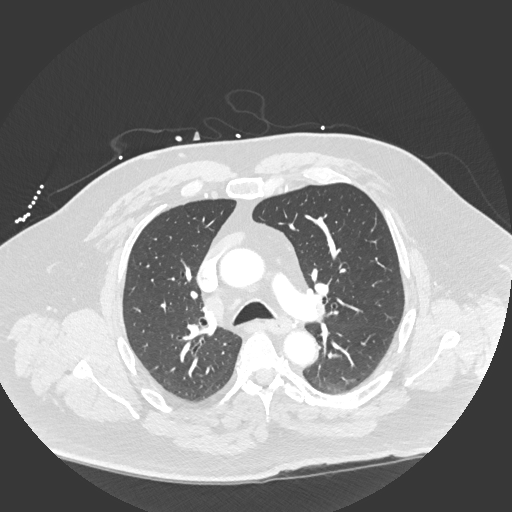
[im 195/321  soft-tissue]
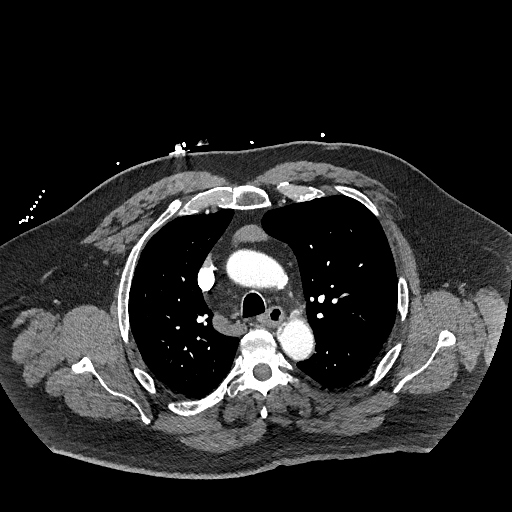
[im 223/321  lung]
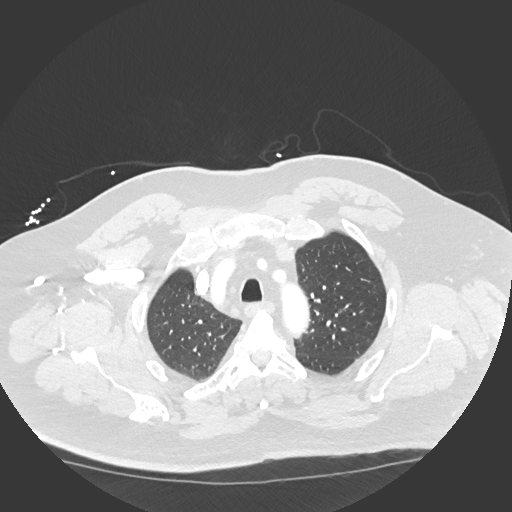
[im 237/321  soft-tissue]
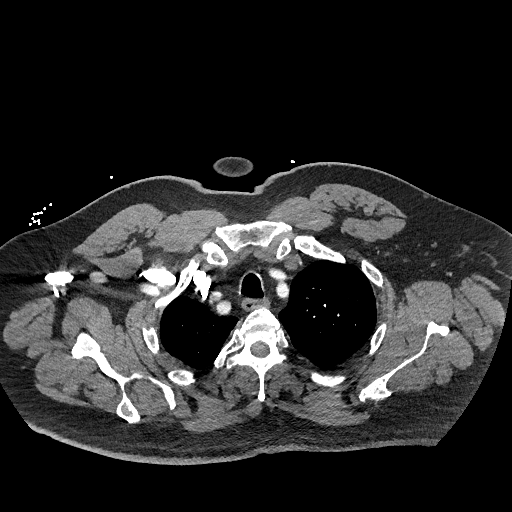
[im 265/321  lung]
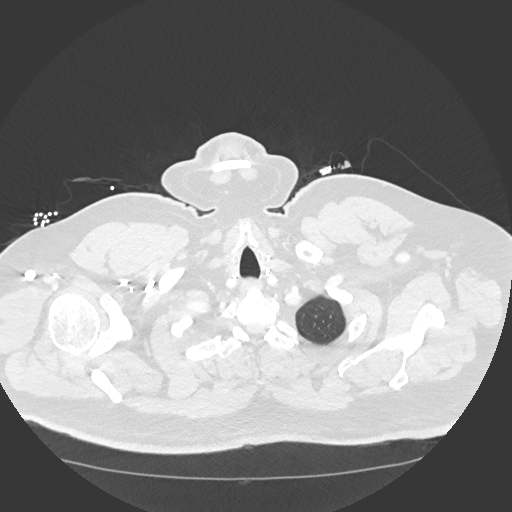
[im 279/321  soft-tissue]
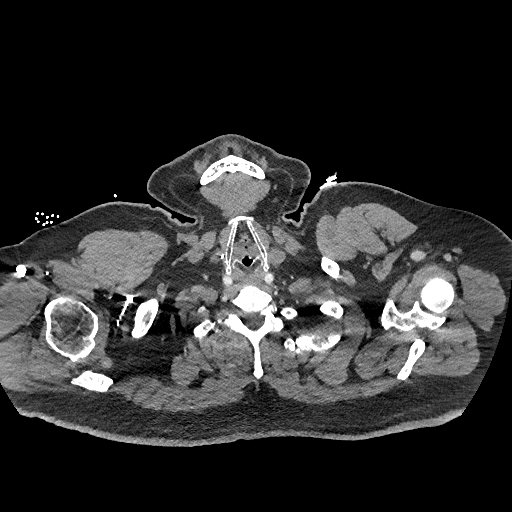
[im 307/321  lung]
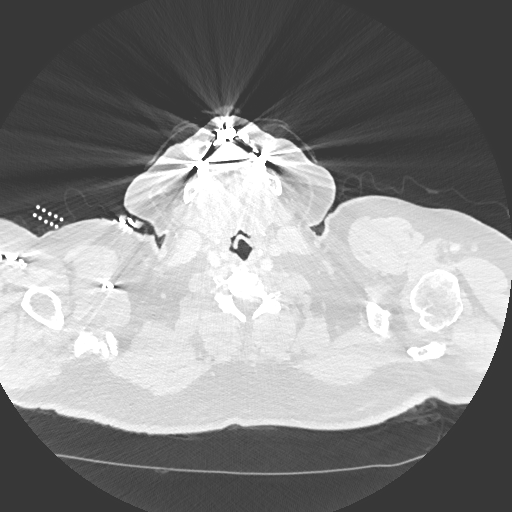

[Series 7: cor soft · coronal · 0.66mm/px · 3 of 202 slices shown]
[im 51/202  soft-tissue]
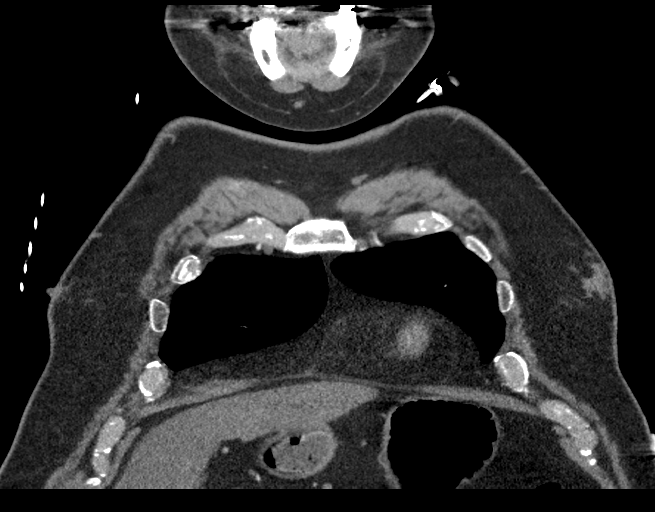
[im 101/202  soft-tissue]
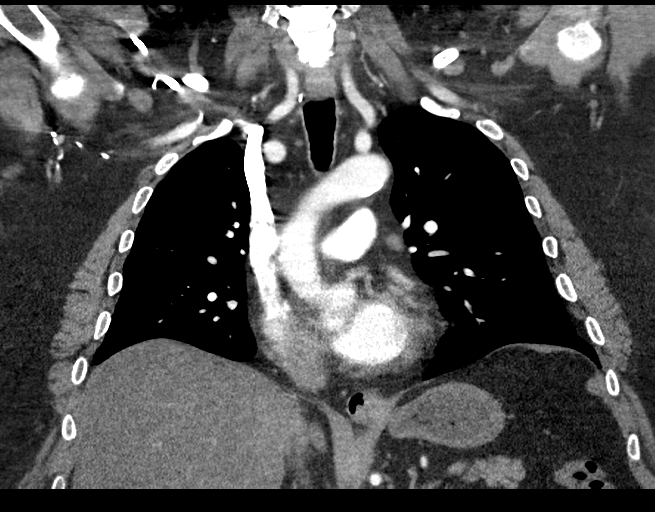
[im 151/202  soft-tissue]
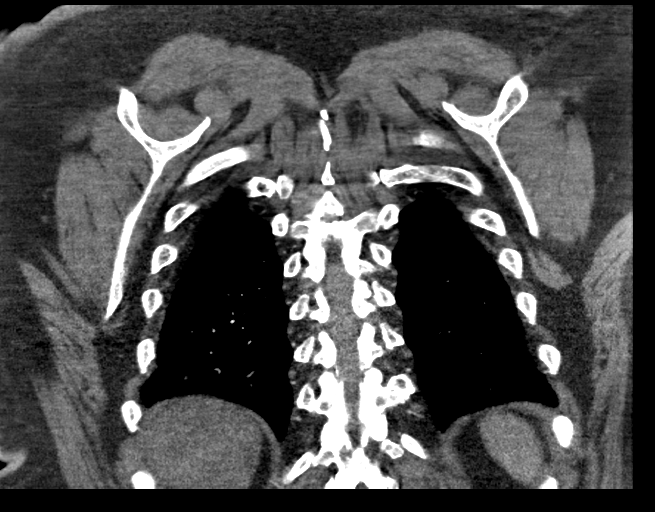

[18 of 46 positions shown; findings below may reference images not displayed]

FINDINGS: Cardiovascular: Satisfactory opacification of the pulmonary arteries
to the segmental level. No evidence of pulmonary embolism. Normal
heart size. No pericardial effusion. Atherosclerosis of thoracic
aorta is noted without aneurysm or dissection.

Mediastinum/Nodes: Status post thyroidectomy. No adenopathy is
noted. The esophagus is unremarkable.

Lungs/Pleura: Lungs are clear. No pleural effusion or pneumothorax.

Upper Abdomen: Hepatic steatosis is noted.

Musculoskeletal: No chest wall abnormality. No acute or significant
osseous findings.

Review of the MIP images confirms the above findings.
IMPRESSION: 1. No definite evidence of pulmonary embolus.
2. Hepatic steatosis.
3. Aortic atherosclerosis.

Aortic Atherosclerosis ([id number]).

## 2022-05-12 ENCOUNTER — Other Ambulatory Visit: Payer: Self-pay

## 2022-05-12 ENCOUNTER — Inpatient Hospital Stay (HOSPITAL_COMMUNITY)
Admission: EM | Admit: 2022-05-12 | Discharge: 2022-06-14 | DRG: 207 | Disposition: A | Discharge status: Home / Self Care | Payer: Medicare HMO | Attending: Internal Medicine | Admitting: Internal Medicine

## 2022-05-12 ENCOUNTER — Emergency Department (HOSPITAL_COMMUNITY): Payer: Medicare HMO

## 2022-05-12 ENCOUNTER — Encounter (HOSPITAL_COMMUNITY): Payer: Self-pay

## 2022-05-12 DIAGNOSIS — J690 Pneumonitis due to inhalation of food and vomit: Secondary | ICD-10-CM | POA: Diagnosis not present

## 2022-05-12 DIAGNOSIS — E890 Postprocedural hypothyroidism: Secondary | ICD-10-CM | POA: Diagnosis present

## 2022-05-12 DIAGNOSIS — E7220 Disorder of urea cycle metabolism, unspecified: Secondary | ICD-10-CM | POA: Diagnosis not present

## 2022-05-12 DIAGNOSIS — E1151 Type 2 diabetes mellitus with diabetic peripheral angiopathy without gangrene: Secondary | ICD-10-CM | POA: Diagnosis present

## 2022-05-12 DIAGNOSIS — Z7982 Long term (current) use of aspirin: Secondary | ICD-10-CM

## 2022-05-12 DIAGNOSIS — I2699 Other pulmonary embolism without acute cor pulmonale: Secondary | ICD-10-CM | POA: Diagnosis not present

## 2022-05-12 DIAGNOSIS — I82412 Acute embolism and thrombosis of left femoral vein: Secondary | ICD-10-CM | POA: Diagnosis present

## 2022-05-12 DIAGNOSIS — Z8673 Personal history of transient ischemic attack (TIA), and cerebral infarction without residual deficits: Secondary | ICD-10-CM

## 2022-05-12 DIAGNOSIS — K922 Gastrointestinal hemorrhage, unspecified: Secondary | ICD-10-CM

## 2022-05-12 DIAGNOSIS — Z9911 Dependence on respirator [ventilator] status: Secondary | ICD-10-CM

## 2022-05-12 DIAGNOSIS — N179 Acute kidney failure, unspecified: Secondary | ICD-10-CM

## 2022-05-12 DIAGNOSIS — Z7401 Bed confinement status: Secondary | ICD-10-CM

## 2022-05-12 DIAGNOSIS — I5031 Acute diastolic (congestive) heart failure: Secondary | ICD-10-CM | POA: Diagnosis not present

## 2022-05-12 DIAGNOSIS — Z87891 Personal history of nicotine dependence: Secondary | ICD-10-CM

## 2022-05-12 DIAGNOSIS — E785 Hyperlipidemia, unspecified: Secondary | ICD-10-CM | POA: Diagnosis present

## 2022-05-12 DIAGNOSIS — R6521 Severe sepsis with septic shock: Secondary | ICD-10-CM

## 2022-05-12 DIAGNOSIS — E1159 Type 2 diabetes mellitus with other circulatory complications: Secondary | ICD-10-CM | POA: Diagnosis present

## 2022-05-12 DIAGNOSIS — K3184 Gastroparesis: Secondary | ICD-10-CM | POA: Diagnosis present

## 2022-05-12 DIAGNOSIS — M7981 Nontraumatic hematoma of soft tissue: Secondary | ICD-10-CM | POA: Diagnosis present

## 2022-05-12 DIAGNOSIS — N170 Acute kidney failure with tubular necrosis: Secondary | ICD-10-CM | POA: Diagnosis not present

## 2022-05-12 DIAGNOSIS — I10 Essential (primary) hypertension: Secondary | ICD-10-CM | POA: Diagnosis present

## 2022-05-12 DIAGNOSIS — Z7901 Long term (current) use of anticoagulants: Secondary | ICD-10-CM

## 2022-05-12 DIAGNOSIS — D62 Acute posthemorrhagic anemia: Secondary | ICD-10-CM | POA: Diagnosis present

## 2022-05-12 DIAGNOSIS — L89146 Pressure-induced deep tissue damage of left lower back: Secondary | ICD-10-CM | POA: Diagnosis not present

## 2022-05-12 DIAGNOSIS — S00532A Contusion of oral cavity, initial encounter: Secondary | ICD-10-CM | POA: Diagnosis present

## 2022-05-12 DIAGNOSIS — T17918A Gastric contents in respiratory tract, part unspecified causing other injury, initial encounter: Secondary | ICD-10-CM | POA: Diagnosis not present

## 2022-05-12 DIAGNOSIS — I82442 Acute embolism and thrombosis of left tibial vein: Secondary | ICD-10-CM | POA: Diagnosis present

## 2022-05-12 DIAGNOSIS — G4733 Obstructive sleep apnea (adult) (pediatric): Secondary | ICD-10-CM | POA: Diagnosis present

## 2022-05-12 DIAGNOSIS — I4892 Unspecified atrial flutter: Secondary | ICD-10-CM | POA: Diagnosis present

## 2022-05-12 DIAGNOSIS — L89136 Pressure-induced deep tissue damage of right lower back: Secondary | ICD-10-CM | POA: Diagnosis not present

## 2022-05-12 DIAGNOSIS — D6859 Other primary thrombophilia: Secondary | ICD-10-CM | POA: Diagnosis not present

## 2022-05-12 DIAGNOSIS — K920 Hematemesis: Secondary | ICD-10-CM

## 2022-05-12 DIAGNOSIS — E1110 Type 2 diabetes mellitus with ketoacidosis without coma: Secondary | ICD-10-CM | POA: Diagnosis present

## 2022-05-12 DIAGNOSIS — D696 Thrombocytopenia, unspecified: Secondary | ICD-10-CM | POA: Diagnosis present

## 2022-05-12 DIAGNOSIS — E1143 Type 2 diabetes mellitus with diabetic autonomic (poly)neuropathy: Secondary | ICD-10-CM | POA: Diagnosis present

## 2022-05-12 DIAGNOSIS — Z23 Encounter for immunization: Secondary | ICD-10-CM

## 2022-05-12 DIAGNOSIS — K7689 Other specified diseases of liver: Secondary | ICD-10-CM | POA: Diagnosis not present

## 2022-05-12 DIAGNOSIS — M109 Gout, unspecified: Secondary | ICD-10-CM | POA: Diagnosis present

## 2022-05-12 DIAGNOSIS — Y848 Other medical procedures as the cause of abnormal reaction of the patient, or of later complication, without mention of misadventure at the time of the procedure: Secondary | ICD-10-CM | POA: Diagnosis present

## 2022-05-12 DIAGNOSIS — W44F9XA Other object of natural or organic material, entering into or through a natural orifice, initial encounter: Secondary | ICD-10-CM | POA: Diagnosis not present

## 2022-05-12 DIAGNOSIS — J449 Chronic obstructive pulmonary disease, unspecified: Secondary | ICD-10-CM | POA: Diagnosis present

## 2022-05-12 DIAGNOSIS — Z86711 Personal history of pulmonary embolism: Secondary | ICD-10-CM

## 2022-05-12 DIAGNOSIS — Z7951 Long term (current) use of inhaled steroids: Secondary | ICD-10-CM

## 2022-05-12 DIAGNOSIS — Z781 Physical restraint status: Secondary | ICD-10-CM

## 2022-05-12 DIAGNOSIS — Z79899 Other long term (current) drug therapy: Secondary | ICD-10-CM

## 2022-05-12 DIAGNOSIS — J189 Pneumonia, unspecified organism: Secondary | ICD-10-CM | POA: Diagnosis not present

## 2022-05-12 DIAGNOSIS — K5900 Constipation, unspecified: Secondary | ICD-10-CM | POA: Diagnosis not present

## 2022-05-12 DIAGNOSIS — E860 Dehydration: Secondary | ICD-10-CM | POA: Diagnosis present

## 2022-05-12 DIAGNOSIS — G9341 Metabolic encephalopathy: Secondary | ICD-10-CM | POA: Diagnosis not present

## 2022-05-12 DIAGNOSIS — A4153 Sepsis due to Serratia: Secondary | ICD-10-CM | POA: Diagnosis not present

## 2022-05-12 DIAGNOSIS — J4489 Other specified chronic obstructive pulmonary disease: Secondary | ICD-10-CM | POA: Diagnosis present

## 2022-05-12 DIAGNOSIS — I252 Old myocardial infarction: Secondary | ICD-10-CM

## 2022-05-12 DIAGNOSIS — I468 Cardiac arrest due to other underlying condition: Secondary | ICD-10-CM | POA: Diagnosis present

## 2022-05-12 DIAGNOSIS — J9602 Acute respiratory failure with hypercapnia: Secondary | ICD-10-CM | POA: Diagnosis present

## 2022-05-12 DIAGNOSIS — I2782 Chronic pulmonary embolism: Secondary | ICD-10-CM | POA: Diagnosis present

## 2022-05-12 DIAGNOSIS — Z6841 Body Mass Index (BMI) 40.0 and over, adult: Secondary | ICD-10-CM

## 2022-05-12 DIAGNOSIS — Y95 Nosocomial condition: Secondary | ICD-10-CM | POA: Diagnosis not present

## 2022-05-12 DIAGNOSIS — G9340 Encephalopathy, unspecified: Secondary | ICD-10-CM

## 2022-05-12 DIAGNOSIS — Z95828 Presence of other vascular implants and grafts: Secondary | ICD-10-CM

## 2022-05-12 DIAGNOSIS — Z66 Do not resuscitate: Secondary | ICD-10-CM | POA: Diagnosis present

## 2022-05-12 DIAGNOSIS — E88819 Insulin resistance, unspecified: Secondary | ICD-10-CM | POA: Diagnosis not present

## 2022-05-12 DIAGNOSIS — Z8616 Personal history of COVID-19: Secondary | ICD-10-CM

## 2022-05-12 DIAGNOSIS — E875 Hyperkalemia: Secondary | ICD-10-CM | POA: Diagnosis not present

## 2022-05-12 DIAGNOSIS — E119 Type 2 diabetes mellitus without complications: Secondary | ICD-10-CM | POA: Diagnosis present

## 2022-05-12 DIAGNOSIS — Z8249 Family history of ischemic heart disease and other diseases of the circulatory system: Secondary | ICD-10-CM

## 2022-05-12 DIAGNOSIS — E876 Hypokalemia: Secondary | ICD-10-CM | POA: Diagnosis not present

## 2022-05-12 DIAGNOSIS — Z86718 Personal history of other venous thrombosis and embolism: Secondary | ICD-10-CM

## 2022-05-12 DIAGNOSIS — I82432 Acute embolism and thrombosis of left popliteal vein: Secondary | ICD-10-CM | POA: Diagnosis present

## 2022-05-12 DIAGNOSIS — J1569 Pneumonia due to other gram-negative bacteria: Secondary | ICD-10-CM | POA: Insufficient documentation

## 2022-05-12 DIAGNOSIS — R34 Anuria and oliguria: Secondary | ICD-10-CM | POA: Diagnosis not present

## 2022-05-12 DIAGNOSIS — R578 Other shock: Secondary | ICD-10-CM | POA: Diagnosis present

## 2022-05-12 DIAGNOSIS — I2694 Multiple subsegmental pulmonary emboli without acute cor pulmonale: Principal | ICD-10-CM

## 2022-05-12 DIAGNOSIS — Z91138 Patient's unintentional underdosing of medication regimen for other reason: Secondary | ICD-10-CM

## 2022-05-12 DIAGNOSIS — I480 Paroxysmal atrial fibrillation: Secondary | ICD-10-CM | POA: Diagnosis not present

## 2022-05-12 DIAGNOSIS — E870 Hyperosmolality and hypernatremia: Secondary | ICD-10-CM | POA: Diagnosis present

## 2022-05-12 DIAGNOSIS — J9600 Acute respiratory failure, unspecified whether with hypoxia or hypercapnia: Secondary | ICD-10-CM

## 2022-05-12 DIAGNOSIS — A411 Sepsis due to other specified staphylococcus: Secondary | ICD-10-CM | POA: Diagnosis not present

## 2022-05-12 DIAGNOSIS — I110 Hypertensive heart disease with heart failure: Secondary | ICD-10-CM | POA: Diagnosis present

## 2022-05-12 DIAGNOSIS — I878 Other specified disorders of veins: Secondary | ICD-10-CM | POA: Diagnosis present

## 2022-05-12 DIAGNOSIS — J9601 Acute respiratory failure with hypoxia: Secondary | ICD-10-CM

## 2022-05-12 DIAGNOSIS — R451 Restlessness and agitation: Secondary | ICD-10-CM | POA: Diagnosis not present

## 2022-05-12 DIAGNOSIS — I70203 Unspecified atherosclerosis of native arteries of extremities, bilateral legs: Secondary | ICD-10-CM | POA: Diagnosis present

## 2022-05-12 DIAGNOSIS — Z79890 Hormone replacement therapy (postmenopausal): Secondary | ICD-10-CM

## 2022-05-12 DIAGNOSIS — I4721 Torsades de pointes: Secondary | ICD-10-CM | POA: Diagnosis present

## 2022-05-12 DIAGNOSIS — R042 Hemoptysis: Secondary | ICD-10-CM | POA: Diagnosis present

## 2022-05-12 DIAGNOSIS — A419 Sepsis, unspecified organism: Secondary | ICD-10-CM

## 2022-05-12 DIAGNOSIS — T45516A Underdosing of anticoagulants, initial encounter: Secondary | ICD-10-CM | POA: Diagnosis present

## 2022-05-12 DIAGNOSIS — R579 Shock, unspecified: Secondary | ICD-10-CM

## 2022-05-12 LAB — BASIC METABOLIC PANEL
Anion gap: 7 (ref 5–15)
BUN: 13 mg/dL (ref 8–23)
CO2: 24 mmol/L (ref 22–32)
Calcium: 8.6 mg/dL — ABNORMAL LOW (ref 8.9–10.3)
Chloride: 103 mmol/L (ref 98–111)
Creatinine, Ser: 1.06 mg/dL (ref 0.61–1.24)
GFR, Estimated: >60 mL/min (ref >60)
Glucose, Bld: 384 mg/dL — ABNORMAL HIGH (ref 70–99)
Potassium: 4.1 mmol/L (ref 3.5–5.1)
Sodium: 134 mmol/L — ABNORMAL LOW (ref 135–145)

## 2022-05-12 LAB — CBC
HCT: 45.7 % (ref 39.0–52.0)
Hemoglobin: 15.3 g/dL (ref 13.0–17.0)
MCH: 32.5 pg (ref 26.0–34.0)
MCHC: 33.5 g/dL (ref 30.0–36.0)
MCV: 97 fL (ref 80.0–100.0)
Platelets: 137 10*3/uL — ABNORMAL LOW (ref 150–400)
RBC: 4.71 MIL/uL (ref 4.22–5.81)
RDW: 14.5 % (ref 11.5–15.5)
WBC: 9.4 10*3/uL (ref 4.0–10.5)
nRBC: 0 % (ref 0.0–0.2)

## 2022-05-12 LAB — HEPARIN LEVEL (UNFRACTIONATED)
Heparin Unfractionated: <0.1 IU/mL — ABNORMAL LOW (ref 0.30–0.70)
Heparin Unfractionated: 0.56 IU/mL (ref 0.30–0.70)

## 2022-05-12 LAB — GLUCOSE, CAPILLARY: Glucose-Capillary: 257 mg/dL — ABNORMAL HIGH (ref 70–99)

## 2022-05-12 LAB — CBG MONITORING, ED
Glucose-Capillary: 309 mg/dL — ABNORMAL HIGH (ref 70–99)
Glucose-Capillary: 321 mg/dL — ABNORMAL HIGH (ref 70–99)

## 2022-05-12 LAB — TROPONIN I (HIGH SENSITIVITY)
Troponin I (High Sensitivity): 4 ng/L (ref <18)
Troponin I (High Sensitivity): 5 ng/L (ref <18)

## 2022-05-12 LAB — APTT: aPTT: 101 seconds — ABNORMAL HIGH (ref 24–36)

## 2022-05-12 MED ORDER — OXYCODONE-ACETAMINOPHEN 5-325 MG PO TABS
2.0000 | ORAL_TABLET | Freq: Four times a day (QID) | ORAL | Status: DC | PRN
  Administered 2022-05-12 – 2022-05-13 (×4): 2 via ORAL
  Filled 2022-05-12 (×4): qty 2

## 2022-05-12 MED ORDER — INSULIN ASPART 100 UNIT/ML IJ SOLN
0.0000 [IU] | INTRAMUSCULAR | Status: DC
  Administered 2022-05-12: 11 [IU] via SUBCUTANEOUS
  Administered 2022-05-13: 5 [IU] via SUBCUTANEOUS
  Administered 2022-05-13: 8 [IU] via SUBCUTANEOUS
  Administered 2022-05-13: 5 [IU] via SUBCUTANEOUS
  Filled 2022-05-12: qty 1

## 2022-05-12 MED ORDER — MOMETASONE FURO-FORMOTEROL FUM 200-5 MCG/ACT IN AERO
2.0000 | INHALATION_SPRAY | Freq: Two times a day (BID) | RESPIRATORY_TRACT | Status: DC
  Administered 2022-05-12 – 2022-05-13 (×3): 2 via RESPIRATORY_TRACT
  Filled 2022-05-12: qty 8.8

## 2022-05-12 MED ORDER — ASPIRIN 325 MG PO TABS
325.0000 mg | ORAL_TABLET | Freq: Once | ORAL | Status: AC
  Administered 2022-05-12: 325 mg via ORAL
  Filled 2022-05-12: qty 1

## 2022-05-12 MED ORDER — ALLOPURINOL 300 MG PO TABS
300.0000 mg | ORAL_TABLET | Freq: Every day | ORAL | Status: DC
  Administered 2022-05-12 – 2022-05-13 (×2): 300 mg via ORAL
  Filled 2022-05-12: qty 3
  Filled 2022-05-12 (×3): qty 1

## 2022-05-12 MED ORDER — ATORVASTATIN CALCIUM 40 MG PO TABS
40.0000 mg | ORAL_TABLET | Freq: Every day | ORAL | Status: DC
  Administered 2022-05-12 – 2022-05-13 (×2): 40 mg via ORAL
  Filled 2022-05-12 (×2): qty 1

## 2022-05-12 MED ORDER — INFLUENZA VAC SPLIT QUAD 0.5 ML IM SUSY
0.5000 mL | PREFILLED_SYRINGE | INTRAMUSCULAR | Status: AC
  Administered 2022-05-13: 0.5 mL via INTRAMUSCULAR
  Filled 2022-05-12: qty 0.5

## 2022-05-12 MED ORDER — HEPARIN (PORCINE) 25000 UT/250ML-% IV SOLN
2000.0000 [IU]/h | INTRAVENOUS | Status: DC
  Administered 2022-05-12 – 2022-05-13 (×2): 2000 [IU]/h via INTRAVENOUS
  Filled 2022-05-12 (×2): qty 250

## 2022-05-12 MED ORDER — HYDROCODONE-ACETAMINOPHEN 5-325 MG PO TABS: 1.0000 | ORAL_TABLET | Freq: Once | ORAL | Status: DC

## 2022-05-12 MED ORDER — ONDANSETRON HCL 4 MG/2ML IJ SOLN
4.0000 mg | Freq: Four times a day (QID) | INTRAMUSCULAR | Status: DC | PRN
  Administered 2022-05-13: 4 mg via INTRAVENOUS
  Filled 2022-05-12: qty 2

## 2022-05-12 MED ORDER — ALBUTEROL SULFATE HFA 108 (90 BASE) MCG/ACT IN AERS: 2.0000 | INHALATION_SPRAY | RESPIRATORY_TRACT | Status: DC | PRN

## 2022-05-12 MED ORDER — ACETAMINOPHEN 325 MG PO TABS
650.0000 mg | ORAL_TABLET | Freq: Four times a day (QID) | ORAL | Status: DC | PRN
  Filled 2022-05-12: qty 2

## 2022-05-12 MED ORDER — POLYETHYLENE GLYCOL 3350 17 G PO PACK: 17.0000 g | PACK | Freq: Every day | ORAL | Status: DC | PRN

## 2022-05-12 MED ORDER — MORPHINE SULFATE (PF) 2 MG/ML IV SOLN
2.0000 mg | Freq: Once | INTRAVENOUS | Status: AC
  Administered 2022-05-12: 2 mg via INTRAVENOUS
  Filled 2022-05-12: qty 1

## 2022-05-12 MED ORDER — ACETAMINOPHEN 650 MG RE SUPP
650.0000 mg | Freq: Four times a day (QID) | RECTAL | Status: DC | PRN
  Administered 2022-05-14 – 2022-05-16 (×3): 650 mg via RECTAL
  Filled 2022-05-12 (×4): qty 1

## 2022-05-12 MED ORDER — OXYCODONE-ACETAMINOPHEN 5-325 MG PO TABS: 2.0000 | ORAL_TABLET | Freq: Once | ORAL | Status: DC

## 2022-05-12 MED ORDER — OXYCODONE-ACETAMINOPHEN 5-325 MG PO TABS
1.0000 | ORAL_TABLET | Freq: Once | ORAL | Status: AC
  Administered 2022-05-12: 1 via ORAL
  Filled 2022-05-12: qty 1

## 2022-05-12 MED ORDER — ASPIRIN 81 MG PO TBEC
81.0000 mg | DELAYED_RELEASE_TABLET | Freq: Every day | ORAL | Status: DC
  Administered 2022-05-13: 81 mg via ORAL
  Filled 2022-05-12: qty 1

## 2022-05-12 MED ORDER — ASPIRIN 81 MG PO TBEC
81.0000 mg | DELAYED_RELEASE_TABLET | Freq: Every day | ORAL | Status: DC
  Administered 2022-05-12 – 2022-05-13 (×2): 81 mg via ORAL
  Filled 2022-05-12 (×2): qty 1

## 2022-05-12 MED ORDER — ALBUTEROL SULFATE (2.5 MG/3ML) 0.083% IN NEBU
2.5000 mg | INHALATION_SOLUTION | RESPIRATORY_TRACT | Status: DC | PRN
  Administered 2022-05-15 – 2022-05-25 (×5): 2.5 mg via RESPIRATORY_TRACT
  Filled 2022-05-12 (×6): qty 3

## 2022-05-12 MED ORDER — MORPHINE SULFATE (PF) 4 MG/ML IV SOLN
4.0000 mg | Freq: Once | INTRAVENOUS | Status: DC
  Filled 2022-05-12 (×2): qty 1

## 2022-05-12 MED ORDER — INSULIN ASPART 100 UNIT/ML IJ SOLN
8.0000 [IU] | Freq: Once | INTRAMUSCULAR | Status: AC
  Administered 2022-05-12: 8 [IU] via SUBCUTANEOUS
  Filled 2022-05-12: qty 1

## 2022-05-12 MED ORDER — LEVOTHYROXINE SODIUM 100 MCG PO TABS
200.0000 ug | ORAL_TABLET | Freq: Every day | ORAL | Status: DC
  Administered 2022-05-13: 200 ug via ORAL
  Filled 2022-05-12 (×3): qty 2

## 2022-05-12 MED ORDER — HEPARIN BOLUS VIA INFUSION
6500.0000 [IU] | Freq: Once | INTRAVENOUS | Status: AC
  Administered 2022-05-12: 6500 [IU] via INTRAVENOUS

## 2022-05-12 MED ORDER — FUROSEMIDE 20 MG PO TABS: 20.0000 mg | ORAL_TABLET | Freq: Every day | ORAL | Status: DC | PRN

## 2022-05-12 MED ORDER — IOHEXOL 350 MG/ML SOLN
100.0000 mL | Freq: Once | INTRAVENOUS | Status: AC | PRN
  Administered 2022-05-12: 100 mL via INTRAVENOUS

## 2022-05-12 MED ORDER — ONDANSETRON HCL 4 MG PO TABS: 4.0000 mg | ORAL_TABLET | Freq: Four times a day (QID) | ORAL | Status: DC | PRN

## 2022-05-12 NOTE — Assessment & Plan Note (Signed)
Blood sugar 384. - HgbA1c -Reports he is on semaglutide, ran out about 2 weeks ago - SSI- M, Q4h

## 2022-05-12 NOTE — Assessment & Plan Note (Addendum)
History of PE and hypercoagulable state, last dose of Xarelto 12/24, missed 3 days of Xarelto before onset of symptoms.  Presenting with chest pain, difficulty breathing, dizziness.  CTA chest shows multiple PE, possible chronic right heart failure.  Has been on Xarelto for well over 10 years.  Reports prior to xarelto he was on warfarin- since 1986 but his blood was never thin enough.  He has bilateral lower extremity edema secondary to venous hypertension-saw Dr. Tawanna Cooler 2018. -Continue heparin drip -Obtain echo -Bilateral lower extremity venous Dopplers -Hold Xarelto for now, he reports Xarelto has worked well for him and he has tolerated it well and wants to go back to Xarelto. -Resume home 20 mg as needed Lasix - May need to curbside hematology 2 okay just resuming Xarelto and not changing anticoagulation

## 2022-05-12 NOTE — H&P (Signed)
History and Physical    Frank Shelton. MR:[id number] DOB: 01-29-1961 DOA: 05/12/2022  PCP: [name], FNP   Patient coming from: Home  I have personally briefly reviewed patient's old medical records in [place]  Chief Complaint: Chest pain, difficulty breathing  HPI: Frank Shelton. is a 61 y.o. male with medical history significant for  PE and DVT, DM, HTN, Gout, COPD, OSA.  Patient presented to the ED with complaints of difficulty breathing, chest pain started about 1 AM this morning.  He has since had 3 episodes of the same.  He was walking to the bathroom when symptoms started, felt like he could not catch his breath and was going to pass out.  Chest pain is left-sided.  Reports chest pain feels similar to when he had a heart attack in 2021.  Patient's last dose of Xarelto was Christmas eve- morning of 12/24, he has not had subsequent doses due to the chaos of the holidays.  He has missed 3 doses of Xarelto before onset of symptoms.  Patient has been on Xarelto for well over 10 years, he has not been diagnosed with PE or DVTs since then.  Prior to that he was on warfarin, but he said his blood was never thin enough.  He has a chronic cough, he reports rare blood in sputum, but otherwise he has tolerated Xarelto well without bleeding.  ED Course: Temperature 97.9.  Heart rate 60s to 70s.  Respirate rate 11-20.  Blood pressure systolic 1 [id number].  O2 sats 97 to 100% on room air. Troponin 4 > 5. CTA chest -acute PE in right middle and lower lobe, and more distal pulmonary artery branches, also segmental branches of right upper lobe pulmonary artery.  Also suggests possible chronic right heart. Heparin drip started.  Review of Systems: As per HPI all other systems reviewed and negative.  Past Medical History:  Diagnosis Date   Arthritis    L shoulder & fingers    Bronchitis    during enviromental changes    Complication of anesthesia    Report of airway  problem   Diabetes mellitus without complication (HCC)    Dyspnea    Embolism - blood clot    Gout    Headache    uses ibuprofen & tylenol & its relieved    Hypertension    Hypothyroidism    Myocardial infarction ([place]) 2012   OSA on CPAP    Peripheral vascular disease ([place])    Pulmonary embolism ([place])    Stroke ([place]) 2014    Past Surgical History:  Procedure Laterality Date   HELLER MYOTOMY  2014   [place]   IVC FILTER INSERTION  2010   ROTATOR CUFF REPAIR Right    SKIN GRAFT     x3- to face., child    THYROIDECTOMY N/A 03/09/2017   Procedure: TOTAL THYROIDECTOMY;  Surgeon: Armandina Gemma, MD;  Location: [facility];  Service: General;  Laterality: N/A;   TOTAL THYROIDECTOMY  03/09/2017     reports that he quit smoking about 16 years ago. His smoking use included cigarettes. He started smoking about 52 years ago. He has a 30.00 pack-year smoking history. He has quit using smokeless tobacco. He reports that he does not currently use alcohol. He reports that he does not use drugs.  Allergies  Allergen Reactions   Nabumetone Hives    Family History  Problem Relation Age of Onset   Heart attack Sister 27  Two stents   Heart attack Mother    Heart attack Maternal Grandmother    Heart attack Maternal Grandfather    Prior to Admission medications   Medication Sig Start Date End Date Taking? Authorizing Provider  acetaminophen (TYLENOL) 325 MG tablet Take 650 mg by mouth every 6 (six) hours as needed for moderate pain or headache.   Yes [provider]  albuterol (VENTOLIN HFA) 108 (90 Base) MCG/ACT inhaler Inhale 2 puffs into the lungs every 4 (four) hours as needed for wheezing or shortness of breath. 05/18/20  Yes Shah, Pratik D, DO  allopurinol (ZYLOPRIM) 300 MG tablet Take 300 mg by mouth daily.   Yes [provider]  aspirin EC 81 MG tablet Take 81 mg by mouth daily. Swallow whole.   Yes [provider]  atorvastatin (LIPITOR) 40 MG tablet  Take 40 mg by mouth daily.   Yes [provider]  budesonide-formoterol (SYMBICORT) 160-4.5 MCG/ACT inhaler Inhale 2 puffs into the lungs 2 (two) times daily as needed (for shortness of breath).    Yes [provider]  Cholecalciferol (VITAMIN D3) 5000 units CAPS Take 5,000 Units by mouth 2 (two) times daily.   Yes [provider]  colchicine 0.6 MG tablet Take 0.6 mg by mouth daily as needed (for gout flare/pain).    Yes [provider]  Cyanocobalamin (B-12) 5000 MCG CAPS Take 5,000 mcg by mouth daily.    Yes [provider]  furosemide (LASIX) 20 MG tablet Take 20 mg by mouth daily as needed.    Yes [provider]  levothyroxine (SYNTHROID, LEVOTHROID) 200 MCG tablet TAKE 1 TABLET EVERY DAY Patient taking differently: Take 200 mcg by mouth daily before breakfast. 09/28/17  Yes Nida, Marella Chimes, MD  Multiple Vitamin (MULTIVITAMIN WITH MINERALS) TABS tablet Take 1 tablet by mouth daily.   Yes [provider]  vitamin C (ASCORBIC ACID) 500 MG tablet Take 500 mg by mouth daily.   Yes [provider]  XARELTO 20 MG TABS tablet TAKE 1 TABLET EVERY DAY Patient taking differently: Take 20 mg by mouth daily with supper. 10/10/18  Yes Herminio Commons, MD  calcium carbonate (TUMS) 500 MG chewable tablet Chew 2 tablets (400 mg of elemental calcium total) by mouth 4 (four) times daily. Patient not taking: Reported on 05/12/2022 03/11/17   Armandina Gemma, MD  calcium-vitamin D (OSCAL 500/200 D-3) 500-200 MG-UNIT tablet Take 1 tablet by mouth 2 (two) times daily. Patient not taking: Reported on 05/12/2022 02/02/17   Aviva Signs, MD  isosorbide mononitrate (IMDUR) 30 MG 24 hr tablet Take 30 mg by mouth every day Patient not taking: Reported on 05/12/2022 12/07/17   Herminio Commons, MD  methocarbamol (ROBAXIN) 500 MG tablet Take 1 tablet (500 mg total) by mouth 2 (two) times daily as needed for muscle spasms. Patient not taking:  Reported on 05/12/2022 11/08/21   Noemi Chapel, MD    Physical Exam: Vitals:   05/12/22 1338 05/12/22 1400 05/12/22 1430 05/12/22 1500  BP: 125/76 129/75 117/78 123/78  Pulse: 80 70 70 67  Resp: 15 11 13 14   Temp:      TempSrc:      SpO2: 99% 97% 97% 99%  Weight:      Height:        Constitutional: NAD, calm, comfortable Vitals:   05/12/22 1338 05/12/22 1400 05/12/22 1430 05/12/22 1500  BP: 125/76 129/75 117/78 123/78  Pulse: 80 70 70 67  Resp: 15 11 13  14  Temp:      TempSrc:      SpO2: 99% 97% 97% 99%  Weight:      Height:       Eyes: PERRL, lids and conjunctivae normal ENMT: Mucous membranes are moist.  Neck: normal, supple, no masses, no thyromegaly Respiratory: clear to auscultation bilaterally, no wheezing, no crackles. Normal respiratory effort. No accessory muscle use.  Cardiovascular: Regular rate and rhythm, no murmurs / rubs / gallops.  Wearing compression stockings, at least trace to 1+ pitting bilateral lower extremity edema.  Extremities warm. Abdomen: no tenderness, no masses palpated. No hepatosplenomegaly. Bowel sounds positive.  Musculoskeletal: no clubbing / cyanosis. No joint deformity upper and lower extremities.  Skin: no rashes, lesions, ulcers. No induration Neurologic: Apparent cranial nerve abnormality, moving extremities spontaneously.  Marland Kitchen  Psychiatric: Normal judgment and insight. Alert and oriented x 3. Normal mood.   Labs on Admission: I have personally reviewed following labs and imaging studies  CBC: Recent Labs  Lab 05/12/22 1200  WBC 9.4  HGB 15.3  HCT 45.7  MCV 97.0  PLT 0000000*   Basic Metabolic Panel: Recent Labs  Lab 05/12/22 1200  NA 134*  K 4.1  CL 103  CO2 24  GLUCOSE 384*  BUN 13  CREATININE 1.06  CALCIUM 8.6*    Radiological Exams on Admission: CT Angio Chest PE W and/or Wo Contrast  Result Date: 05/12/2022 CLINICAL DATA:  Pulmonary embolism suspected. High probability. Shortness of breath and chest pain that  started while walking to the bathroom. EXAM: CT ANGIOGRAPHY CHEST WITH CONTRAST TECHNIQUE: Multidetector CT imaging of the chest was performed using the standard protocol during bolus administration of intravenous contrast. Multiplanar CT image reconstructions and MIPs were obtained to evaluate the vascular anatomy. RADIATION DOSE REDUCTION: This exam was performed according to the departmental dose-optimization program which includes automated exposure control, adjustment of the mA and/or kV according to patient size and/or use of iterative reconstruction technique. CONTRAST:  132mL OMNIPAQUE IOHEXOL 350 MG/ML SOLN COMPARISON:  AP chest 05/12/2022, CTA chest 05/16/2020 FINDINGS: Cardiovascular: The main pulmonary artery is opacified up to 347 Hounsfield units. There are filling defects indicating pulmonary emboli within the right middle and lower lobe lobar pulmonary artery branches and the majority of the right middle lobe medial segmental branch and the proximal aspect of the posterior and lateral right lower lobe segmental branches. There are also filling defects indicating pulmonary emboli within the apical greater than anterior segmental branches of the right upper lobe pulmonary artery. Question tiny filling defects within the proximal aspect of the anteromedial and posterior segmental branches of the left lower lobe pulmonary artery (axial series 5, image 159), however there are somewhat similar low-density foci on the prior remote 05/16/2020 CTA in the same region and this may represent normal mixing of contrasted and non contrasted blood. Heart size is mildly enlarged. The right ventricle to left ventricle ratio measures 1.2, mildly enlarged. However, this appears not significantly changed from 05/16/2020 CT when no pulmonary embolism was seen. No pericardial effusion. No thoracic aortic aneurysm. Minimal atherosclerotic calcifications within the aortic arch. Mediastinum/Nodes: No axillary, mediastinal, or  hilar pathologically enlarged lymph nodes by CT criteria. Status post bilateral thyroidectomy. The esophagus follows a normal course of normal caliber. Lungs/Pleura: The central airways are patent. Mild curvilinear subsegmental atelectasis within the posterior lingula. No pleural effusion pneumothorax. Upper Abdomen: There is diffuse decreased density again seen throughout the liver suggesting fatty infiltration. Musculoskeletal: Incidental note of moderate to high-grade  bilateral gynecomastia, unchanged. Mild dextrocurvature of the upper thoracic spine. Moderate multilevel degenerative disc changes. Review of the MIP images confirms the above findings. IMPRESSION: 1. Acute pulmonary emboli within the right middle and lower lobe lobar and more distal pulmonary artery branches. There are also pulmonary emboli within the apical greater than anterior segmental branches of the right upper lobe pulmonary artery. 2. The right ventricle to left ventricle ratio measures 1.2, mildly enlarged. However, this appears not significantly changed from 05/16/2020 CT when no pulmonary embolism was seen. Therefore, this increased ratio may be secondary to chronic right-sided heart failure rather than acute right heart strain. Recommend clinical correlation. Critical Value/emergent results were called by telephone at the time of interpretation on 05/12/2022 at 2:26 pm to provider Dr. Pricilla Loveless, who verbally acknowledged these results. Aortic Atherosclerosis (ICD10-I70.0). Electronically Signed   By: Neita Garnet M.D.   On: 05/12/2022 14:32   DG Chest Port 1 View  Result Date: 05/12/2022 CLINICAL DATA:  Shortness of breath, chest pain EXAM: PORTABLE CHEST 1 VIEW COMPARISON:  Previous studies including the examination of 05/16/2020 FINDINGS: Transverse diameter of heart is slightly increased. This may be partly due to poor inspiration. There are no signs of pulmonary edema or focal pulmonary consolidation. There is minimal  blunting of left lateral CP angle. There is no pneumothorax. Surgical clips are seen in thyroid bed. IMPRESSION: There are no signs of pulmonary edema or focal pulmonary consolidation. Blunting of left lateral CP angle may suggest minimal pleural effusion or pleural thickening. Electronically Signed   By: Ernie Avena M.D.   On: 05/12/2022 12:57    EKG: Independently reviewed.  Sinus rhythm rate 70, QTc 483.  No significant change from prior.  Assessment/Plan Principal Problem:   Pulmonary embolism (HCC) Active Problems:   GOUT   OBSTRUCTIVE SLEEP APNEA   Essential hypertension   Other specified chronic obstructive pulmonary disease   Hypercoagulopathy (HCC)   DM type 2 causing vascular disease (HCC)  Assessment and Plan: * Pulmonary embolism (HCC) History of PE and hypercoagulable state, last dose of Xarelto 12/24, missed 3 days of Xarelto before onset of symptoms.  Presenting with chest pain, difficulty breathing, dizziness.  CTA chest shows multiple PE, possible chronic right heart failure.  Has been on Xarelto for well over 10 years.  Reports prior to xarelto he was on warfarin- since 1986 but his blood was never thin enough.  He has bilateral lower extremity edema secondary to venous hypertension-saw Dr. Tawanna Cooler 2018. -Continue heparin drip -Obtain echo -Bilateral lower extremity venous Dopplers -Hold Xarelto for now, he reports Xarelto has worked well for him and he has tolerated it well and wants to go back to Xarelto. -Resume home 20 mg as needed Lasix - May need to curbside hematology 2 okay just resuming Xarelto and not changing anticoagulation  DM type 2 causing vascular disease (HCC) Blood sugar 384. - HgbA1c -Reports he is on semaglutide, ran out about 2 weeks ago - SSI- M, Q4h  Other specified chronic obstructive pulmonary disease Quit smoking cigarettes 16 years ago. -Resume home bronchodilator regimen.  Essential hypertension Stable.  Not on  medication.  OBSTRUCTIVE SLEEP APNEA CPAP nightly  GOUT Resume allopurinol   DVT prophylaxis: Heparin Code Status: Full code Family Communication: Sounds, and nephew at bedside Disposition Plan: ~ 1 - 2 days Consults called: None  Admission status:  Obs tele     Author: Onnie Boer, MD 05/12/2022 4:57 PM  For on call review [email or web address].

## 2022-05-12 NOTE — ED Provider Notes (Signed)
[facility] EMERGENCY DEPARTMENT Provider Note   CSN: [id number] Arrival date & time: 05/12/22  1133     History  Chief Complaint  Patient presents with   Shortness of Breath    Frank Shelton. is a 61 y.o. 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  Patient states that he was getting up from his living room to use the bathroom around 1 AM this morning.  He felt fine walking to the bathroom, but after leaving the bathroom he had to hold onto the wall because he fell like he was get a pass out.  At that point he had severe central chest pain, shortness of breath, and lightheadedness.  He states that he had an additional episode later in the morning, as well as while walking into the ER.  States that he has missed his Xarelto the past 3 days due to stress around the holidays.   Shortness of Breath Associated symptoms: chest pain   Associated symptoms: no cough        Home Medications Prior to Admission medications   Medication Sig Start Date End Date Taking? Authorizing Provider  acetaminophen (TYLENOL) 325 MG tablet Take 650 mg by mouth every 6 (six) hours as needed for moderate pain or headache.   Yes [provider]  albuterol (VENTOLIN HFA) 108 (90 Base) MCG/ACT inhaler Inhale 2 puffs into the lungs every 4 (four) hours as needed for wheezing or shortness of breath. 05/18/20  Yes Shah, Pratik D, DO  allopurinol (ZYLOPRIM) 300 MG tablet Take 300 mg by mouth daily.   Yes [provider]  aspirin EC 81 MG tablet Take 81 mg by mouth daily. Swallow whole.   Yes [provider]  atorvastatin (LIPITOR) 40 MG tablet Take 40 mg by mouth daily.   Yes [provider]  budesonide-formoterol (SYMBICORT) 160-4.5 MCG/ACT inhaler Inhale 2 puffs into the lungs 2 (two) times daily as needed (for shortness of breath).    Yes [provider]  Cholecalciferol (VITAMIN D3) 5000 units CAPS Take 5,000 Units by mouth 2 (two) times daily.   Yes [provider]  colchicine 0.6 MG tablet Take 0.6 mg by mouth daily as needed (for gout flare/pain).    Yes [provider]  Cyanocobalamin (B-12) 5000 MCG CAPS Take 5,000 mcg by mouth daily.    Yes [provider]  furosemide (LASIX) 20 MG tablet Take 20 mg by mouth daily as needed.    Yes [provider]  levothyroxine (SYNTHROID, LEVOTHROID) 200 MCG tablet TAKE 1 TABLET EVERY DAY Patient taking differently: Take 200 mcg by mouth daily before breakfast. 09/28/17  Yes Nida, Denman George, MD  Multiple Vitamin (MULTIVITAMIN WITH MINERALS) TABS tablet Take 1 tablet by mouth daily.   Yes [provider]  vitamin C (ASCORBIC ACID) 500 MG tablet Take 500 mg by mouth daily.   Yes [provider]  XARELTO 20 MG TABS tablet TAKE 1 TABLET EVERY DAY Patient taking differently: Take 20 mg by mouth daily with supper. 10/10/18  Yes Laqueta Linden, MD  calcium carbonate (TUMS) 500 MG chewable tablet Chew 2 tablets (400 mg of elemental calcium total) by mouth 4 (four) times daily. Patient not taking: Reported on 05/12/2022  03/11/17   Darnell Level, MD  calcium-vitamin D (OSCAL 500/200 D-3) 500-200 MG-UNIT tablet Take 1 tablet by mouth 2 (two) times daily. Patient not taking: Reported on 05/12/2022 02/02/17   [name], MD  isosorbide mononitrate (IMDUR) 30 MG 24 hr tablet Take 30 mg by mouth every day Patient not taking: Reported on 05/12/2022 12/07/17   Laqueta Linden, MD  methocarbamol (ROBAXIN) 500 MG tablet Take 1 tablet (500 mg total) by mouth 2 (two) times daily as needed for muscle spasms. Patient not taking: Reported on 05/12/2022 11/08/21   Eber Hong, MD      Allergies    Nabumetone    Review of Systems   Review of Systems  Respiratory:  Positive for shortness of breath. Negative for cough.   Cardiovascular:  Positive for chest pain and leg swelling. Negative for palpitations.  Neurological:  Positive for light-headedness. Negative for syncope.  All other systems reviewed and are negative.   Physical Exam Updated Vital Signs BP 123/78   Pulse 67   Temp 98 F (36.7 C) (Oral)   Resp 14   Ht 6\' 4"  (1.93 m)   Wt (!) 149.7 kg   SpO2 99%   BMI 40.17 kg/m  Physical Exam Vitals and nursing note reviewed.  Constitutional:      Appearance: Normal appearance. He is obese.  HENT:     Head: Normocephalic and atraumatic.  Eyes:     Conjunctiva/sclera: Conjunctivae normal.  Cardiovascular:     Rate and Rhythm: Normal rate and regular rhythm.  Pulmonary:     Effort: Pulmonary effort is normal. No respiratory distress.     Breath sounds: Normal breath sounds.  Abdominal:     General: There is no distension.     Palpations: Abdomen is soft.     Tenderness: There is no abdominal tenderness.  Musculoskeletal:     Right lower leg: 2+ Pitting Edema present.     Left lower leg: 2+ Pitting Edema present.  Skin:    General: Skin is warm and dry.  Neurological:     General: No focal deficit present.     Mental Status: He is alert.     ED Results / Procedures / Treatments   Labs (all labs ordered are listed, but only abnormal results are displayed) Labs Reviewed  BASIC METABOLIC PANEL - Abnormal; Notable for the following components:      Result Value   Sodium 134 (*)    Glucose, Bld 384 (*)    Calcium 8.6 (*)    All other components within normal limits  CBC - Abnormal; Notable for the following components:   Platelets 137 (*)    All other components within normal limits  HEPARIN LEVEL (UNFRACTIONATED) - Abnormal; Notable for the following components:   Heparin Unfractionated <0.10 (*)    All other components  within normal limits  HEPARIN LEVEL (UNFRACTIONATED)  APTT  HEMOGLOBIN A1C  TROPONIN I (HIGH SENSITIVITY)  TROPONIN I (HIGH SENSITIVITY)    EKG EKG Interpretation  Date/Time:  Thursday May 12 2022 11:42:06 EST Ventricular Rate:  70 PR Interval:  196 QRS Duration: 90 QT Interval:  448 QTC Calculation: 483 R Axis:   -24 Text Interpretation: Normal sinus rhythm Inferior infarct (cited on or before 16-May-2020) Abnormal ECG  overall similar to Jan 2022 Confirmed by Feb 2022 [phone]) on 05/12/2022 2:12:56 PM  Radiology CT Angio Chest PE W and/or Wo Contrast  Result Date: 05/12/2022 CLINICAL DATA:  Pulmonary embolism suspected. High  probability. Shortness of breath and chest pain that started while walking to the bathroom. EXAM: CT ANGIOGRAPHY CHEST WITH CONTRAST TECHNIQUE: Multidetector CT imaging of the chest was performed using the standard protocol during bolus administration of intravenous contrast. Multiplanar CT image reconstructions and MIPs were obtained to evaluate the vascular anatomy. RADIATION DOSE REDUCTION: This exam was performed according to the departmental dose-optimization program which includes automated exposure control, adjustment of the mA and/or kV according to patient size and/or use of iterative reconstruction technique. CONTRAST:  OMNIPAQUE IOHEXOL 350 MG/ML SOLN COMPARISON:  AP chest 05/12/2022, CTA chest 05/16/2020 FINDINGS: Cardiovascular: The main pulmonary artery is opacified up to 347 Hounsfield units. There are filling defects indicating pulmonary emboli within the right middle and lower lobe lobar pulmonary artery branches and the majority of the right middle lobe medial segmental branch and the proximal aspect of the posterior and lateral right lower lobe segmental branches. There are also filling defects indicating pulmonary emboli within the apical greater than anterior segmental branches of the right upper lobe pulmonary artery. Question tiny  filling defects within the proximal aspect of the anteromedial and posterior segmental branches of the left lower lobe pulmonary artery (axial series 5, image 159), however there are somewhat similar low-density foci on the prior remote 05/16/2020 CTA in the same region and this may represent normal mixing of contrasted and non contrasted blood. Heart size is mildly enlarged. The right ventricle to left ventricle ratio measures 1.2, mildly enlarged. However, this appears not significantly changed from 05/16/2020 CT when no pulmonary embolism was seen. No pericardial effusion. No thoracic aortic aneurysm. Minimal atherosclerotic calcifications within the aortic arch. Mediastinum/Nodes: No axillary, mediastinal, or hilar pathologically enlarged lymph nodes by CT criteria. Status post bilateral thyroidectomy. The esophagus follows a normal course of normal caliber. Lungs/Pleura: The central airways are patent. Mild curvilinear subsegmental atelectasis within the posterior lingula. No pleural effusion pneumothorax. Upper Abdomen: There is diffuse decreased density again seen throughout the liver suggesting fatty infiltration. Musculoskeletal: Incidental note of moderate to high-grade bilateral gynecomastia, unchanged. Mild dextrocurvature of the upper thoracic spine. Moderate multilevel degenerative disc changes. Review of the MIP images confirms the above findings. IMPRESSION: 1. Acute pulmonary emboli within the right middle and lower lobe lobar and more distal pulmonary artery branches. There are also pulmonary emboli within the apical greater than anterior segmental branches of the right upper lobe pulmonary artery. 2. The right ventricle to left ventricle ratio measures 1.2, mildly enlarged. However, this appears not significantly changed from 05/16/2020 CT when no pulmonary embolism was seen. Therefore, this increased ratio may be secondary to chronic right-sided heart failure rather than acute right heart strain.  Recommend clinical correlation. Critical Value/emergent results were called by telephone at the time of interpretation on 05/12/2022 at 2:26 pm to provider Dr. Pricilla Loveless, who verbally acknowledged these results. Aortic Atherosclerosis (ICD10-I70.0). Electronically Signed   By: Neita Garnet M.D.   On: 05/12/2022 14:32   DG Chest Port 1 View  Result Date: 05/12/2022 CLINICAL DATA:  Shortness of breath, chest pain EXAM: PORTABLE CHEST 1 VIEW COMPARISON:  Previous studies including the examination of 05/16/2020 FINDINGS: Transverse diameter of heart is slightly increased. This may be partly due to poor inspiration. There are no signs of pulmonary edema or focal pulmonary consolidation. There is minimal blunting of left lateral CP angle. There is no pneumothorax. Surgical clips are seen in thyroid bed. IMPRESSION: There are no signs of pulmonary edema or focal pulmonary consolidation. Blunting of left  lateral CP angle may suggest minimal pleural effusion or pleural thickening. Electronically Signed   By: Ernie Avena M.D.   On: 05/12/2022 12:57    Procedures .Critical Care  Performed by: Su Monks, PA-C Authorized by: Su Monks, PA-C   Critical care provider statement:    Critical care time (minutes):  30   Critical care time was exclusive of:  Separately billable procedures and treating other patients   Critical care was necessary to treat or prevent imminent or life-threatening deterioration of the following conditions:  Cardiac failure   Critical care was time spent personally by me on the following activities:  Development of treatment plan with patient or surrogate, discussions with consultants, evaluation of patient's response to treatment, examination of patient, ordering and review of laboratory studies, ordering and review of radiographic studies, ordering and performing treatments and interventions, pulse oximetry, re-evaluation of patient's condition and review of  old charts   Care discussed with: admitting provider   Comments:     Requiring heparin for pulmonary embolism     Medications Ordered in ED Medications  heparin ADULT infusion 100 units/mL (25000 units/2101mL) (2,000 Units/hr Intravenous New Bag/Given 05/12/22 1510)  insulin aspart (novoLOG) injection 0-15 Units (has no administration in time range)  aspirin tablet 325 mg (325 mg Oral Given 05/12/22 1341)  iohexol (OMNIPAQUE) 350 MG/ML injection 100 mL (100 mLs Intravenous Contrast Given 05/12/22 1347)  morphine (PF) 2 MG/ML injection 2 mg (2 mg Intravenous Given 05/12/22 1405)  heparin bolus via infusion 6,500 Units (6,500 Units Intravenous Bolus from Bag 05/12/22 1511)    ED Course/ Medical Decision Making/ A&P                           Medical Decision Making Amount and/or Complexity of Data Reviewed Labs: ordered. Radiology: ordered.  Risk OTC drugs. Prescription drug management.   This patient is a 61 y.o. male who presents to the ED for concern of chest pain, this involves an extensive number of treatment options, and is a complaint that carries with it a high risk of complications and morbidity. The emergent differential diagnosis prior to evaluation includes, but is not limited to,  ACS, pericarditis, aortic dissection, PE, pneumothorax, esophageal spasm or rupture, chronic angina, valvular disease, cardiomyopathy, myocarditis, pulmonary HTN, pneumonia, bronchitis, GERD, reflux/PUD, biliary disease, pancreatitis, disk disease, costochondritis, anxiety or panic attack.  This is not an exhaustive differential.   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 Pertinent results include: Most  recently admitted to the hospital in January 2022 for unstable angina in the setting of COVID-19 infection.  Follows with [facility] for cardiology, but has not seen them since March 2022.  Prior to that admission in 2015 for unstable angina.  Most recent echocardiogram January 2022 with normal ejection fraction.  Stress test performed a month later showed evidence of a prior myocardial infarction, but overall low risk study.  Physical Exam: Physical exam performed. The pertinent findings include: Normal vital signs. No increased work of breathing, normal lung sounds and normal oxygen saturation. Bilateral 2+ pitting edema to the legs.   Lab Tests: I ordered, and personally interpreted labs.  The pertinent results include:  CBC unremarkable. Glucose  384, electrolytes and kidney function unremarkable. Initial troponin 4 , delta troponin 5.    Imaging Studies: I ordered imaging studies including chest x-ray and CT PE study. I independently visualized and interpreted imaging which showed acute PE in right middle, lower lobar, and distal pulmonary artery branches. Evidence of some mild chronic right heart strain. I agree with the radiologist interpretation.   Cardiac Monitoring:  The patient was maintained on a cardiac monitor.  My attending physician Dr. Criss Alvine viewed and interpreted the cardiac monitored which showed an underlying rhythm of: normal sinus rhythm with no acute change compared to prior. I agree with this interpretation.   Medications: I ordered medication including aspirin, morphine, and heparin. I have reviewed the patients home medicines and have made adjustments as needed.  Consultations Obtained: I requested consultation with the hospitalist Dr Mariea Clonts, and discussed lab and imaging findings as well as pertinent plan - they recommend: medical admission for ongoing anticoagulation   Disposition: After consideration of the diagnostic results and the patients  response to treatment, I feel that patient would benefit from medical admission for ongoing anticoagulation in the setting of noncompliance with warfarin and IVC filter in place.   I discussed this case with my attending physician Dr. Criss Alvine who cosigned this note including patient's presenting symptoms, physical exam, and planned diagnostics and interventions. Attending physician stated agreement with plan or made changes to plan which were implemented.    Final Clinical Impression(s) / ED Diagnoses Final diagnoses:  Multiple subsegmental pulmonary emboli without acute cor pulmonale (HCC)    Rx / DC Orders ED Discharge Orders     None      Portions of this report may have been transcribed using voice recognition software. Every effort was made to ensure accuracy; however, inadvertent computerized transcription errors may be present.    Jeanella Flattery 05/12/22 1614    Pricilla Loveless, MD 05/13/22 1545

## 2022-05-12 NOTE — ED Notes (Addendum)
Per patient right arm numbness since yesterday from the wrist down

## 2022-05-12 NOTE — ED Triage Notes (Signed)
Pt c/o sob and chest pain at 1am.  Says started while walking to the bathroom.  Denies any recent illness.

## 2022-05-12 NOTE — Assessment & Plan Note (Signed)
Resume allopurinol 

## 2022-05-12 NOTE — Assessment & Plan Note (Addendum)
Stable.  Not on medication. 

## 2022-05-12 NOTE — Progress Notes (Signed)
ANTICOAGULATION CONSULT NOTE - Initial Consult  Pharmacy Consult for heparin Indication: pulmonary embolus  Allergies  Allergen Reactions   Nabumetone Hives    Patient Measurements: Height: 6\' 4"  (193 cm) Weight: (!) 149.7 kg (330 lb) IBW/kg (Calculated) : 86.8 Heparin Dosing Weight: 120 kg  Vital Signs: Temp: 97.9 F (36.6 C) (12/28 1141) Temp Source: Temporal (12/28 1141) BP: 117/78 (12/28 1430) Pulse Rate: 70 (12/28 1430)  Labs: Recent Labs    05/12/22 1200 05/12/22 1339  HGB 15.3  --   HCT 45.7  --   PLT 137*  --   CREATININE 1.06  --   TROPONINIHS 4 5    Estimated Creatinine Clearance: 113 mL/min (by C-G formula based on SCr of 1.06 mg/dL).   Medical History: Past Medical History:  Diagnosis Date   Arthritis    L shoulder & fingers    Bronchitis    during enviromental changes    Complication of anesthesia    Report of airway problem   Diabetes mellitus without complication (HCC)    Dyspnea    Embolism - blood clot    Gout    Headache    uses ibuprofen & tylenol & its relieved    Hypertension    Hypothyroidism    Myocardial infarction (HCC) 2012   OSA on CPAP    Peripheral vascular disease (HCC)    Pulmonary embolism (HCC)    Stroke (HCC) 2014    Medications:  (Not in a hospital admission)   Assessment: Pharmacy consulted to dose heparin in patient with acute pulmonary emboli within right middle and lower lobe lobar.  Patient is on Xarelto prior to admission with last dose listed as 12/24 AM. Baseline heparin level ordered.  CBC WNL  Goal of Therapy:  Heparin level 0.3-0.7 units/ml aPTT 66-102 seconds Monitor platelets by anticoagulation protocol: Yes   Plan:  Give 6500 units bolus x 1 Start heparin infusion at 2000 units/hr Check anti-Xa level in 6 hours and daily while on heparin Continue to monitor H&H and platelets  1/25, PharmD Clinical Pharmacist 05/12/2022 2:49 PM

## 2022-05-12 NOTE — Assessment & Plan Note (Signed)
CPAP nightly

## 2022-05-12 NOTE — Assessment & Plan Note (Signed)
Quit smoking cigarettes 16 years ago. -Resume home bronchodilator regimen.

## 2022-05-12 NOTE — Progress Notes (Signed)
ANTICOAGULATION CONSULT NOTE Pharmacy Consult for heparin Indication: pulmonary embolus Brief A/P: Heparin level within goal range Continue Heparin at current rate   Allergies  Allergen Reactions   Nabumetone Hives    Patient Measurements: Height: 6\' 4"  (193 cm) Weight: (!) 149.7 kg (330 lb) IBW/kg (Calculated) : 86.8 Heparin Dosing Weight: 120 kg  Vital Signs: Temp: 97.9 F (36.6 C) (12/28 2000) Temp Source: Oral (12/28 2000) BP: 125/79 (12/28 2230) Pulse Rate: 65 (12/28 2230)  Labs: Recent Labs    05/12/22 1200 05/12/22 1339 05/12/22 1450 05/12/22 2052  HGB 15.3  --   --   --   HCT 45.7  --   --   --   PLT 137*  --   --   --   APTT  --   --   --  101*  HEPARINUNFRC  --   --  <0.10* 0.56  CREATININE 1.06  --   --   --   TROPONINIHS 4 5  --   --      Estimated Creatinine Clearance: 113 mL/min (by C-G formula based on SCr of 1.06 mg/dL).  Assessment: 61 y.o. male with PE for heparin  Goal of Therapy:  Heparin level 0.3-0.7 units/ml aPTT 66-102 seconds Monitor platelets by anticoagulation protocol: Yes   Plan:  Continue Heparin at current rate  Follow-up am labs.  62, PharmD, BCPS  05/12/2022 11:02 PM

## 2022-05-13 ENCOUNTER — Observation Stay (HOSPITAL_BASED_OUTPATIENT_CLINIC_OR_DEPARTMENT_OTHER): Payer: Medicare HMO

## 2022-05-13 ENCOUNTER — Observation Stay (HOSPITAL_COMMUNITY): Payer: Medicare HMO

## 2022-05-13 ENCOUNTER — Other Ambulatory Visit (HOSPITAL_COMMUNITY): Payer: Self-pay | Admitting: *Deleted

## 2022-05-13 DIAGNOSIS — I2609 Other pulmonary embolism with acute cor pulmonale: Secondary | ICD-10-CM | POA: Diagnosis not present

## 2022-05-13 DIAGNOSIS — I2699 Other pulmonary embolism without acute cor pulmonale: Secondary | ICD-10-CM | POA: Diagnosis not present

## 2022-05-13 LAB — CBC
HCT: 40.2 % (ref 39.0–52.0)
Hemoglobin: 13.4 g/dL (ref 13.0–17.0)
MCH: 32.5 pg (ref 26.0–34.0)
MCHC: 33.3 g/dL (ref 30.0–36.0)
MCV: 97.6 fL (ref 80.0–100.0)
Platelets: 129 10*3/uL — ABNORMAL LOW (ref 150–400)
RBC: 4.12 MIL/uL — ABNORMAL LOW (ref 4.22–5.81)
RDW: 14.5 % (ref 11.5–15.5)
WBC: 8.8 10*3/uL (ref 4.0–10.5)
nRBC: 0 % (ref 0.0–0.2)

## 2022-05-13 LAB — GLUCOSE, CAPILLARY
Glucose-Capillary: 233 mg/dL — ABNORMAL HIGH (ref 70–99)
Glucose-Capillary: 245 mg/dL — ABNORMAL HIGH (ref 70–99)
Glucose-Capillary: 276 mg/dL — ABNORMAL HIGH (ref 70–99)
Glucose-Capillary: 301 mg/dL — ABNORMAL HIGH (ref 70–99)
Glucose-Capillary: 346 mg/dL — ABNORMAL HIGH (ref 70–99)
Glucose-Capillary: 325 mg/dL — ABNORMAL HIGH (ref 70–99)

## 2022-05-13 LAB — HEPARIN LEVEL (UNFRACTIONATED): Heparin Unfractionated: <0.1 IU/mL — ABNORMAL LOW (ref 0.30–0.70)

## 2022-05-13 LAB — ECHOCARDIOGRAM COMPLETE
Area-P 1/2: 2.69 cm2
Height: 76 in
S' Lateral: 2.6 cm
Weight: 5992 oz

## 2022-05-13 LAB — BASIC METABOLIC PANEL
Anion gap: 10 (ref 5–15)
BUN: 15 mg/dL (ref 8–23)
CO2: 23 mmol/L (ref 22–32)
Calcium: 8.3 mg/dL — ABNORMAL LOW (ref 8.9–10.3)
Chloride: 102 mmol/L (ref 98–111)
Creatinine, Ser: 0.98 mg/dL (ref 0.61–1.24)
GFR, Estimated: >60 mL/min (ref >60)
Glucose, Bld: 245 mg/dL — ABNORMAL HIGH (ref 70–99)
Potassium: 3.5 mmol/L (ref 3.5–5.1)
Sodium: 135 mmol/L (ref 135–145)

## 2022-05-13 LAB — HIV ANTIBODY (ROUTINE TESTING W REFLEX): HIV Screen 4th Generation wRfx: NONREACTIVE

## 2022-05-13 MED ORDER — INSULIN ASPART 100 UNIT/ML IJ SOLN
0.0000 [IU] | Freq: Three times a day (TID) | INTRAMUSCULAR | Status: DC
  Administered 2022-05-13: 8 [IU] via SUBCUTANEOUS
  Administered 2022-05-13: 11 [IU] via SUBCUTANEOUS
  Administered 2022-05-14 (×2): 15 [IU] via SUBCUTANEOUS

## 2022-05-13 MED ORDER — MELATONIN 3 MG PO TABS
9.0000 mg | ORAL_TABLET | Freq: Every evening | ORAL | Status: DC | PRN
  Administered 2022-05-13: 9 mg via ORAL
  Filled 2022-05-13: qty 3

## 2022-05-13 MED ORDER — INSULIN GLARGINE-YFGN 100 UNIT/ML ~~LOC~~ SOLN
20.0000 [IU] | Freq: Every day | SUBCUTANEOUS | Status: DC
  Administered 2022-05-13 – 2022-05-14 (×2): 20 [IU] via SUBCUTANEOUS
  Filled 2022-05-13 (×3): qty 0.2

## 2022-05-13 MED ORDER — PERFLUTREN LIPID MICROSPHERE
1.0000 mL | INTRAVENOUS | Status: AC | PRN
  Administered 2022-05-13: 3 mL via INTRAVENOUS

## 2022-05-13 MED ORDER — MAGNESIUM SULFATE 4 GM/100ML IV SOLN
4.0000 g | Freq: Once | INTRAVENOUS | Status: AC
  Administered 2022-05-13: 4 g via INTRAVENOUS
  Filled 2022-05-13: qty 100

## 2022-05-13 MED ORDER — ENOXAPARIN SODIUM 300 MG/3ML IJ SOLN
170.0000 mg | Freq: Two times a day (BID) | INTRAMUSCULAR | Status: DC
  Administered 2022-05-13 (×2): 170 mg via SUBCUTANEOUS
  Filled 2022-05-13 (×5): qty 1.7

## 2022-05-13 NOTE — Progress Notes (Signed)
  Transition of Care [facility]) Screening Note   Patient Details  Name: Frank Shelton. Date of Birth: 1960-07-19   Transition of Care [facility]) CM/SW Contact:    Annice Needy, LCSW Phone Number: 05/13/2022, 2:39 PM    Transition of Care Department [facility]) has reviewed patient and no TOC needs have been identified at this time. We will continue to monitor patient advancement through interdisciplinary progression rounds. If new patient transition needs arise, please place a TOC consult.

## 2022-05-13 NOTE — Care Management Obs Status (Signed)
MEDICARE OBSERVATION STATUS NOTIFICATION   Patient Details  Name: Frank Shelton. MRN: [id number] Date of Birth: [birth year]   Medicare Observation Status Notification Given:  Yes    Corey Harold 05/13/2022, 4:37 PM

## 2022-05-13 NOTE — Progress Notes (Signed)
Telemetry called and stated patient had a 8.83 second run of Torsade de Pointes. Patient continues to c/o chest pain that he states started this morning. EKG performed on floor. Dr. Imogene Burn notified through secure chat and EKG placed in front of chart. New medication orders given. Will continue to monitor patient closely.

## 2022-05-13 NOTE — Progress Notes (Signed)
IV site infiltrated at 0645. Attempted to start a new PIV. Attempts unsuccessful.

## 2022-05-13 NOTE — Progress Notes (Signed)
*  PRELIMINARY RESULTS* Echocardiogram 2D Echocardiogram has been performed with Definity.  Stacey Drain 05/13/2022, 12:55 PM

## 2022-05-13 NOTE — Progress Notes (Signed)
  X-cover Note: Received secure chat from bedside RN. Telemetry notified floor that pt had 8.83 second run of torsade de pointes. It was at 20:29:39 on 05-13-2022.  Will load with IV magnesium 4 gram. Pt with normal renal function from today's labs(BUN 15, Scr 0.98)  Stop zofran   Carollee Herter, DO [facility]

## 2022-05-13 NOTE — Progress Notes (Signed)
PROGRESS NOTE    Frank Shelton.  YT:[id number] DOB: [birth year] DOA: 05/12/2022 PCP: [name], FNP   Brief Narrative:    Frank Shelton. is a 61 y.o. male with medical history significant for  PE and DVT, DM, HTN, Gout, COPD, OSA.  Patient presented to the ED with complaints of difficulty breathing, chest pain started about 1 AM this morning.  Patient was admitted with pulmonary embolism and is now also noted to have left lower extremity DVT that developed after missing 3 doses of Xarelto at home.  Assessment & Plan:   Principal Problem:   Pulmonary embolism (HCC) Active Problems:   GOUT   OBSTRUCTIVE SLEEP APNEA   Essential hypertension   Other specified chronic obstructive pulmonary disease   Hypercoagulopathy ([place])   DM type 2 causing vascular disease (HCC)  Assessment and Plan:   Pulmonary embolism ([place]) with DVT History of PE and hypercoagulable state, last dose of Xarelto 12/24, missed 3 days of Xarelto before onset of symptoms.  Presenting with chest pain, difficulty breathing, dizziness.  CTA chest shows multiple PE, possible chronic right heart failure.  Has been on Xarelto for well over 10 years.  Reports prior to xarelto he was on warfarin- since 1986 but his blood was never thin enough.  He has bilateral lower extremity edema secondary to venous hypertension-saw Dr. Sherren Mocha 2018. -Heparin drip to full dose Lovenox given difficulty with IV access -Obtain echo, pending -Bilateral lower extremity venous Dopplers -Discussed with hematology.  Patient may discharge again with Xarelto once ready and symptomatically improved.  Continue full dose Lovenox for now.  Not considered to be a Xarelto failure.   DM type 2 causing vascular disease (HCC) Blood sugar 384. - HgbA1c -Reports he is on semaglutide, ran out about 2 weeks ago - SSI- M, Q4h   Other specified chronic obstructive pulmonary disease Quit smoking cigarettes 16 years ago. -Resume home bronchodilator  regimen.   Essential hypertension Stable.  Not on medication.   OBSTRUCTIVE SLEEP APNEA CPAP nightly   GOUT Resume allopurinol  Morbid obesity BMI 45.59    DVT prophylaxis:Full dose lovenox Code Status: Full Family Communication: None at bedside Disposition Plan:  Status is: Observation The patient will require care spanning > 2 midnights and should be moved to inpatient because: Need for IV medications.   Consultants:  Discussed with hematology Dr. Raliegh Ip 12/29  Procedures:  None  Antimicrobials:  None   Subjective: Patient seen and evaluated today with ongoing shortness of breath particularly with exertion.  Denies any significant chest pain or shortness of breath at this time.  Objective: Vitals:   05/12/22 2351 05/13/22 0341 05/13/22 0558 05/13/22 1251  BP:  99/70 101/60 115/75  Pulse:  73 68 70  Resp:  19  19  Temp:  97.8 F (36.6 C)  98.4 F (36.9 C)  TempSrc:  Oral    SpO2:  99%  96%  Weight: (!) 169.9 kg     Height: 6\' 4"  (1.93 m)       Intake/Output Summary (Last 24 hours) at 05/13/2022 1415 Last data filed at 05/13/2022 0300 Gross per 24 hour  Intake 290.38 ml  Output --  Net 290.38 ml   Filed Weights   05/12/22 1139 05/12/22 2351  Weight: (!) 149.7 kg (!) 169.9 kg    Examination:  General exam: Appears calm and comfortable  Respiratory system: Clear to auscultation. Respiratory effort normal.  Currently on 4 L nasal cannula Cardiovascular system: S1 &  S2 heard, RRR.  Gastrointestinal system: Abdomen is soft Central nervous system: Alert and awake Extremities: No edema Skin: No significant lesions noted Psychiatry: Flat affect.    Data Reviewed: I have personally reviewed following labs and imaging studies  CBC: Recent Labs  Lab 05/12/22 1200 05/13/22 0457  WBC 9.4 8.8  HGB 15.3 13.4  HCT 45.7 40.2  MCV 97.0 97.6  PLT 137* 129*   Basic Metabolic Panel: Recent Labs  Lab 05/12/22 1200 05/13/22 0457  NA 134* 135  K 4.1  3.5  CL 103 102  CO2 24 23  GLUCOSE 384* 245*  BUN 13 15  CREATININE 1.06 0.98  CALCIUM 8.6* 8.3*   GFR: Estimated Creatinine Clearance: 131 mL/min (by C-G formula based on SCr of 0.98 mg/dL). Liver Function Tests: No results for input(s): "AST", "ALT", "ALKPHOS", "BILITOT", "PROT", "ALBUMIN" in the last 168 hours. No results for input(s): "LIPASE", "AMYLASE" in the last 168 hours. No results for input(s): "AMMONIA" in the last 168 hours. Coagulation Profile: No results for input(s): "INR", "PROTIME" in the last 168 hours. Cardiac Enzymes: No results for input(s): "CKTOTAL", "CKMB", "CKMBINDEX", "TROPONINI" in the last 168 hours. BNP (last 3 results) No results for input(s): "PROBNP" in the last 8760 hours. HbA1C: No results for input(s): "HGBA1C" in the last 72 hours. CBG: Recent Labs  Lab 05/12/22 1921 05/12/22 2345 05/13/22 0332 05/13/22 0729 05/13/22 1123  GLUCAP 321* 257* 233* 245* 276*   Lipid Profile: No results for input(s): "CHOL", "HDL", "LDLCALC", "TRIG", "CHOLHDL", "LDLDIRECT" in the last 72 hours. Thyroid Function Tests: No results for input(s): "TSH", "T4TOTAL", "FREET4", "T3FREE", "THYROIDAB" in the last 72 hours. Anemia Panel: No results for input(s): "VITAMINB12", "FOLATE", "FERRITIN", "TIBC", "IRON", "RETICCTPCT" in the last 72 hours. Sepsis Labs: No results for input(s): "PROCALCITON", "LATICACIDVEN" in the last 168 hours.  No results found for this or any previous visit (from the past 240 hour(s)).       Radiology Studies: US Venous Img Lower Bilateral (DVT)  Result Date: 05/13/2022 CLINICAL DATA:  Pulmonary embolism. Prior history of DVT in the 1980s. Assess for residual DVT EXAM: BILATERAL LOWER EXTREMITY VENOUS DOPPLER ULTRASOUND TECHNIQUE: Gray-scale sonography with graded compression, as well as color Doppler and duplex ultrasound were performed to evaluate the lower extremity deep venous systems from the level of the common femoral vein and  including the common femoral, femoral, profunda femoral, popliteal and calf veins including the posterior tibial, peroneal and gastrocnemius veins when visible. The superficial great saphenous vein was also interrogated. Spectral Doppler was utilized to evaluate flow at rest and with distal augmentation maneuvers in the common femoral, femoral and popliteal veins. COMPARISON:  None Available. FINDINGS: RIGHT LOWER EXTREMITY Common Femoral Vein: No evidence of thrombus. Normal compressibility, respiratory phasicity and response to augmentation. Saphenofemoral Junction: No evidence of thrombus. Normal compressibility and flow on color Doppler imaging. Profunda Femoral Vein: No evidence of thrombus. Normal compressibility and flow on color Doppler imaging. Femoral Vein: No evidence of thrombus. Normal compressibility, respiratory phasicity and response to augmentation. Popliteal Vein: No evidence of thrombus. Normal compressibility, respiratory phasicity and response to augmentation. Calf Veins: No evidence of thrombus. Normal compressibility and flow on color Doppler imaging. Superficial Great Saphenous Vein: Thrombus is present within the great saphenous vein. Thrombus is nonocclusive and likely chronic in nature. Venous Reflux:  None. Other Findings:  None. LEFT LOWER EXTREMITY Common Femoral Vein: Abnormal. The vessel is noncompressible, expanded and filled with low-level internal echoes. No evidence of flow on color  Doppler imaging. Findings are consistent with acute occlusive DVT. Saphenofemoral Junction: Nonocclusive DVT extends into the saphenofemoral junction. Profunda Femoral Vein: Occlusive DVT extends into the profunda femoral vein. Femoral Vein: Occlusive thrombus extends throughout the femoral vein in the left leg. Popliteal Vein: Occlusive thrombus extends through the popliteal vein. Calf Veins: Occlusive thrombus extends into the posterior tibial veins. Superficial Great Saphenous Vein: No evidence of  thrombus. Normal compressibility. Venous Reflux:  None. Other Findings:  None. IMPRESSION: 1. Positive for extensive acute occlusive left lower extremity DVT beginning at the common femoral vein and extending throughout the leg into the calf. 2. Positive for chronic appearing nonocclusive superficial thrombus within the right great saphenous vein. 3. No evidence of deep venous thrombosis in the right lower extremity. Electronically Signed   By: Jacqulynn Cadet M.D.   On: 05/13/2022 10:47   CT Angio Chest PE W and/or Wo Contrast  Result Date: 05/12/2022 CLINICAL DATA:  Pulmonary embolism suspected. High probability. Shortness of breath and chest pain that started while walking to the bathroom. EXAM: CT ANGIOGRAPHY CHEST WITH CONTRAST TECHNIQUE: Multidetector CT imaging of the chest was performed using the standard protocol during bolus administration of intravenous contrast. Multiplanar CT image reconstructions and MIPs were obtained to evaluate the vascular anatomy. RADIATION DOSE REDUCTION: This exam was performed according to the departmental dose-optimization program which includes automated exposure control, adjustment of the mA and/or kV according to patient size and/or use of iterative reconstruction technique. CONTRAST:  131mL OMNIPAQUE IOHEXOL 350 MG/ML SOLN COMPARISON:  AP chest 05/12/2022, CTA chest 05/16/2020 FINDINGS: Cardiovascular: The main pulmonary artery is opacified up to 347 Hounsfield units. There are filling defects indicating pulmonary emboli within the right middle and lower lobe lobar pulmonary artery branches and the majority of the right middle lobe medial segmental branch and the proximal aspect of the posterior and lateral right lower lobe segmental branches. There are also filling defects indicating pulmonary emboli within the apical greater than anterior segmental branches of the right upper lobe pulmonary artery. Question tiny filling defects within the proximal aspect of the  anteromedial and posterior segmental branches of the left lower lobe pulmonary artery (axial series 5, image 159), however there are somewhat similar low-density foci on the prior remote 05/16/2020 CTA in the same region and this may represent normal mixing of contrasted and non contrasted blood. Heart size is mildly enlarged. The right ventricle to left ventricle ratio measures 1.2, mildly enlarged. However, this appears not significantly changed from 05/16/2020 CT when no pulmonary embolism was seen. No pericardial effusion. No thoracic aortic aneurysm. Minimal atherosclerotic calcifications within the aortic arch. Mediastinum/Nodes: No axillary, mediastinal, or hilar pathologically enlarged lymph nodes by CT criteria. Status post bilateral thyroidectomy. The esophagus follows a normal course of normal caliber. Lungs/Pleura: The central airways are patent. Mild curvilinear subsegmental atelectasis within the posterior lingula. No pleural effusion pneumothorax. Upper Abdomen: There is diffuse decreased density again seen throughout the liver suggesting fatty infiltration. Musculoskeletal: Incidental note of moderate to high-grade bilateral gynecomastia, unchanged. Mild dextrocurvature of the upper thoracic spine. Moderate multilevel degenerative disc changes. Review of the MIP images confirms the above findings. IMPRESSION: 1. Acute pulmonary emboli within the right middle and lower lobe lobar and more distal pulmonary artery branches. There are also pulmonary emboli within the apical greater than anterior segmental branches of the right upper lobe pulmonary artery. 2. The right ventricle to left ventricle ratio measures 1.2, mildly enlarged. However, this appears not significantly changed from 05/16/2020 CT when  no pulmonary embolism was seen. Therefore, this increased ratio may be secondary to chronic right-sided heart failure rather than acute right heart strain. Recommend clinical correlation. Critical  Value/emergent results were called by telephone at the time of interpretation on 05/12/2022 at 2:26 pm to provider Dr. Sherwood Gambler, who verbally acknowledged these results. Aortic Atherosclerosis (ICD10-I70.0). Electronically Signed   By: Yvonne Kendall M.D.   On: 05/12/2022 14:32   DG Chest Port 1 View  Result Date: 05/12/2022 CLINICAL DATA:  Shortness of breath, chest pain EXAM: PORTABLE CHEST 1 VIEW COMPARISON:  Previous studies including the examination of 05/16/2020 FINDINGS: Transverse diameter of heart is slightly increased. This may be partly due to poor inspiration. There are no signs of pulmonary edema or focal pulmonary consolidation. There is minimal blunting of left lateral CP angle. There is no pneumothorax. Surgical clips are seen in thyroid bed. IMPRESSION: There are no signs of pulmonary edema or focal pulmonary consolidation. Blunting of left lateral CP angle may suggest minimal pleural effusion or pleural thickening. Electronically Signed   By: Elmer Picker M.D.   On: 05/12/2022 12:57        Scheduled Meds:  allopurinol  300 mg Oral Daily   aspirin EC  81 mg Oral Daily   aspirin EC  81 mg Oral Daily   atorvastatin  40 mg Oral Daily   enoxaparin (LOVENOX) injection  170 mg Subcutaneous Q12H   insulin aspart  0-15 Units Subcutaneous TID with meals   insulin glargine-yfgn  20 Units Subcutaneous Daily   levothyroxine  200 mcg Oral QAC breakfast   mometasone-formoterol  2 puff Inhalation BID    LOS: 0 days    Time spent: 35 minutes    Latishia Suitt Darleen Crocker, DO [facility]  If 7PM-7AM, please contact night-coverage [email or web address] 05/13/2022, 2:15 PM

## 2022-05-13 NOTE — Progress Notes (Signed)
Pt placed on RA o2 sat 94 pt lying in bed and able to make needs known. Will continue to monitor

## 2022-05-13 NOTE — Inpatient Diabetes Management (Incomplete)
Inpatient Diabetes Program Recommendations  AACE/ADA: New Consensus Statement on Inpatient Glycemic Control (2015)  Target Ranges:  Prepandial:   less than 140 mg/dL      Peak postprandial:   less than 180 mg/dL (1-2 hours)      Critically ill patients:  140 - 180 mg/dL   Lab Results  Component Value Date   GLUCAP 245 (H) 05/13/2022   HGBA1C 8.6 (H) 05/16/2020    Review of Glycemic Control  Latest Reference Range & Units 05/12/22 23:45 05/13/22 03:32 05/13/22 07:29  Glucose-Capillary 70 - 99 mg/dL 659 (H) 935 (H) 701 (H)  (H): Data is abnormally high Diabetes history: Type 2 DM Outpatient Diabetes medications: Semaglutide qwk Current orders for Inpatient glycemic control: Novolog 0-15 units Q4H  A1C pending  Inpatient Diabetes Program Recommendations:    Consider: -Adding Semglee 20 units QD -Novolog 0-15 units TID & HS now that patient has a diet  Thanks, Lujean Rave, MSN, RNC-OB Diabetes Coordinator [phone] (8a-5p)

## 2022-05-13 NOTE — Progress Notes (Addendum)
Pt called to the nurses station and stated that he was having chest pain this LPN obtained a 12 lead EKG  on pt and notified attending of the current vitals and of the pt's complaints. Pt was assisted to the bathroom and pt got light headed and became nauseous .  PRN Zofran administered . Attending advised that the echocardiogram of the pt was in good standing. No new orders at this time. Pt lying in bed nursing staff provided wet cold wash  clothes to his forehead and will continue to monitor.     05/13/22 1649  Vitals  Temp 98.4 F (36.9 C)  Temp Source Oral  BP 113/80  MAP (mmHg) 92  BP Location Right Arm  BP Method Automatic  Patient Position (if appropriate) Sitting  Pulse Rate 81  Pulse Rate Source Monitor  Resp 17  MEWS COLOR  MEWS Score Color Green  Oxygen Therapy  SpO2 97 %  O2 Device Room Air  MEWS Score  MEWS Temp 0  MEWS Systolic 0  MEWS Pulse 0  MEWS RR 0  MEWS LOC 0  MEWS Score 0

## 2022-05-13 NOTE — Progress Notes (Signed)
ANTICOAGULATION CONSULT NOTE - Initial Consult  Pharmacy Consult for lovenox Indication: pulmonary embolus  Allergies  Allergen Reactions   Nabumetone Hives    Patient Measurements: Height: 6\' 4"  (193 cm) Weight: (!) 169.9 kg (374 lb 8 oz) IBW/kg (Calculated) : 86.8   Vital Signs: Temp: 97.8 F (36.6 C) (12/29 0341) Temp Source: Oral (12/29 0341) BP: 101/60 (12/29 0558) Pulse Rate: 68 (12/29 0558)  Labs: Recent Labs    05/12/22 1200 05/12/22 1339 05/12/22 1450 05/12/22 2052 05/13/22 0457  HGB 15.3  --   --   --  13.4  HCT 45.7  --   --   --  40.2  PLT 137*  --   --   --  129*  APTT  --   --   --  101*  --   HEPARINUNFRC  --   --  <0.10* 0.56 <0.10*  CREATININE 1.06  --   --   --  0.98  TROPONINIHS 4 5  --   --   --     Estimated Creatinine Clearance: 131 mL/min (by C-G formula based on SCr of 0.98 mg/dL).   Medical History: Past Medical History:  Diagnosis Date   Arthritis    L shoulder & fingers    Bronchitis    during enviromental changes    Complication of anesthesia    Report of airway problem   Diabetes mellitus without complication (HCC)    Dyspnea    Embolism - blood clot    Gout    Headache    uses ibuprofen & tylenol & its relieved    Hypertension    Hypothyroidism    Myocardial infarction (HCC) 2012   OSA on CPAP    Peripheral vascular disease (HCC)    Pulmonary embolism (HCC)    Stroke (HCC) 2014    Medications:  Medications Prior to Admission  Medication Sig Dispense Refill Last Dose   acetaminophen (TYLENOL) 325 MG tablet Take 650 mg by mouth every 6 (six) hours as needed for moderate pain or headache.   unk   albuterol (VENTOLIN HFA) 108 (90 Base) MCG/ACT inhaler Inhale 2 puffs into the lungs every 4 (four) hours as needed for wheezing or shortness of breath. 8 g 2 unk   allopurinol (ZYLOPRIM) 300 MG tablet Take 300 mg by mouth daily.   05/11/2022   aspirin EC 81 MG tablet Take 81 mg by mouth daily. Swallow whole.   05/11/2022    atorvastatin (LIPITOR) 40 MG tablet Take 40 mg by mouth daily.   05/11/2022   budesonide-formoterol (SYMBICORT) 160-4.5 MCG/ACT inhaler Inhale 2 puffs into the lungs 2 (two) times daily as needed (for shortness of breath).    05/12/2022   Cholecalciferol (VITAMIN D3) 5000 units CAPS Take 5,000 Units by mouth 2 (two) times daily.   05/11/2022   colchicine 0.6 MG tablet Take 0.6 mg by mouth daily as needed (for gout flare/pain).    unk   Cyanocobalamin (B-12) 5000 MCG CAPS Take 5,000 mcg by mouth daily.    05/11/2022   furosemide (LASIX) 20 MG tablet Take 20 mg by mouth daily as needed.    05/11/2022   levothyroxine (SYNTHROID, LEVOTHROID) 200 MCG tablet TAKE 1 TABLET EVERY DAY (Patient taking differently: Take 200 mcg by mouth daily before breakfast.) 90 tablet 1 05/11/2022   Multiple Vitamin (MULTIVITAMIN WITH MINERALS) TABS tablet Take 1 tablet by mouth daily.   05/11/2022   vitamin C (ASCORBIC ACID) 500 MG tablet Take 500 mg  by mouth daily.   05/11/2022   XARELTO 20 MG TABS tablet TAKE 1 TABLET EVERY DAY (Patient taking differently: Take 20 mg by mouth daily with supper.) 30 tablet 0 05/08/2022 at am   calcium carbonate (TUMS) 500 MG chewable tablet Chew 2 tablets (400 mg of elemental calcium total) by mouth 4 (four) times daily. (Patient not taking: Reported on 05/12/2022) 120 tablet 1 Not Taking   calcium-vitamin D (OSCAL 500/200 D-3) 500-200 MG-UNIT tablet Take 1 tablet by mouth 2 (two) times daily. (Patient not taking: Reported on 05/12/2022) 60 tablet 1 Not Taking   isosorbide mononitrate (IMDUR) 30 MG 24 hr tablet Take 30 mg by mouth every day (Patient not taking: Reported on 05/12/2022) 90 tablet 0 Not Taking   methocarbamol (ROBAXIN) 500 MG tablet Take 1 tablet (500 mg total) by mouth 2 (two) times daily as needed for muscle spasms. (Patient not taking: Reported on 05/12/2022) 20 tablet 0 Not Taking    Assessment: Pharmacy consulted to dose heparin in patient with acute pulmonary emboli  within right middle and lower lobe lobar.  Patient is on Xarelto prior to admission with last dose listed as 12/24 AM.   Patient lost IV access, transitioning to lovenox for tx.   Goal of Therapy:  Anti-Xa level 0.6-1 unit /ml Monitor platelets by anticoagulation protocol: Yes   Plan:  Lovenox 1mg /kg sq q12h ( 170mg ) Continue to monitor H&H and platelets F/U plan for oral tx  Isac Sarna, BS Pharm D, BCPS Clinical Pharmacist 05/13/2022 8:23 AM

## 2022-05-14 ENCOUNTER — Inpatient Hospital Stay (HOSPITAL_COMMUNITY): Payer: Medicare HMO

## 2022-05-14 DIAGNOSIS — Y848 Other medical procedures as the cause of abnormal reaction of the patient, or of later complication, without mention of misadventure at the time of the procedure: Secondary | ICD-10-CM | POA: Diagnosis present

## 2022-05-14 DIAGNOSIS — W44F9XA Other object of natural or organic material, entering into or through a natural orifice, initial encounter: Secondary | ICD-10-CM | POA: Diagnosis not present

## 2022-05-14 DIAGNOSIS — I469 Cardiac arrest, cause unspecified: Secondary | ICD-10-CM | POA: Diagnosis not present

## 2022-05-14 DIAGNOSIS — E890 Postprocedural hypothyroidism: Secondary | ICD-10-CM | POA: Diagnosis present

## 2022-05-14 DIAGNOSIS — J449 Chronic obstructive pulmonary disease, unspecified: Secondary | ICD-10-CM | POA: Diagnosis present

## 2022-05-14 DIAGNOSIS — Z66 Do not resuscitate: Secondary | ICD-10-CM | POA: Diagnosis present

## 2022-05-14 DIAGNOSIS — J690 Pneumonitis due to inhalation of food and vomit: Secondary | ICD-10-CM | POA: Diagnosis not present

## 2022-05-14 DIAGNOSIS — J9612 Chronic respiratory failure with hypercapnia: Secondary | ICD-10-CM | POA: Diagnosis not present

## 2022-05-14 DIAGNOSIS — J9602 Acute respiratory failure with hypercapnia: Secondary | ICD-10-CM | POA: Diagnosis present

## 2022-05-14 DIAGNOSIS — N170 Acute kidney failure with tubular necrosis: Secondary | ICD-10-CM | POA: Diagnosis not present

## 2022-05-14 DIAGNOSIS — R6521 Severe sepsis with septic shock: Secondary | ICD-10-CM | POA: Diagnosis not present

## 2022-05-14 DIAGNOSIS — A411 Sepsis due to other specified staphylococcus: Secondary | ICD-10-CM | POA: Diagnosis not present

## 2022-05-14 DIAGNOSIS — J9611 Chronic respiratory failure with hypoxia: Secondary | ICD-10-CM | POA: Diagnosis not present

## 2022-05-14 DIAGNOSIS — I2694 Multiple subsegmental pulmonary emboli without acute cor pulmonale: Secondary | ICD-10-CM | POA: Diagnosis not present

## 2022-05-14 DIAGNOSIS — I110 Hypertensive heart disease with heart failure: Secondary | ICD-10-CM | POA: Diagnosis present

## 2022-05-14 DIAGNOSIS — I10 Essential (primary) hypertension: Secondary | ICD-10-CM | POA: Diagnosis not present

## 2022-05-14 DIAGNOSIS — I5021 Acute systolic (congestive) heart failure: Secondary | ICD-10-CM | POA: Diagnosis not present

## 2022-05-14 DIAGNOSIS — R042 Hemoptysis: Secondary | ICD-10-CM | POA: Diagnosis not present

## 2022-05-14 DIAGNOSIS — M1A9XX0 Chronic gout, unspecified, without tophus (tophi): Secondary | ICD-10-CM | POA: Diagnosis not present

## 2022-05-14 DIAGNOSIS — D696 Thrombocytopenia, unspecified: Secondary | ICD-10-CM | POA: Diagnosis present

## 2022-05-14 DIAGNOSIS — R579 Shock, unspecified: Secondary | ICD-10-CM | POA: Diagnosis not present

## 2022-05-14 DIAGNOSIS — Z95828 Presence of other vascular implants and grafts: Secondary | ICD-10-CM | POA: Diagnosis not present

## 2022-05-14 DIAGNOSIS — I2609 Other pulmonary embolism with acute cor pulmonale: Secondary | ICD-10-CM | POA: Diagnosis not present

## 2022-05-14 DIAGNOSIS — Y95 Nosocomial condition: Secondary | ICD-10-CM | POA: Diagnosis not present

## 2022-05-14 DIAGNOSIS — I2699 Other pulmonary embolism without acute cor pulmonale: Secondary | ICD-10-CM | POA: Diagnosis not present

## 2022-05-14 DIAGNOSIS — I468 Cardiac arrest due to other underlying condition: Secondary | ICD-10-CM | POA: Diagnosis present

## 2022-05-14 DIAGNOSIS — J1569 Pneumonia due to other gram-negative bacteria: Secondary | ICD-10-CM | POA: Diagnosis not present

## 2022-05-14 DIAGNOSIS — E875 Hyperkalemia: Secondary | ICD-10-CM | POA: Diagnosis not present

## 2022-05-14 DIAGNOSIS — R578 Other shock: Secondary | ICD-10-CM | POA: Diagnosis present

## 2022-05-14 DIAGNOSIS — J189 Pneumonia, unspecified organism: Secondary | ICD-10-CM | POA: Diagnosis not present

## 2022-05-14 DIAGNOSIS — A419 Sepsis, unspecified organism: Secondary | ICD-10-CM | POA: Diagnosis not present

## 2022-05-14 DIAGNOSIS — G9341 Metabolic encephalopathy: Secondary | ICD-10-CM | POA: Diagnosis not present

## 2022-05-14 DIAGNOSIS — A4153 Sepsis due to Serratia: Secondary | ICD-10-CM | POA: Diagnosis not present

## 2022-05-14 DIAGNOSIS — E1151 Type 2 diabetes mellitus with diabetic peripheral angiopathy without gangrene: Secondary | ICD-10-CM | POA: Diagnosis present

## 2022-05-14 DIAGNOSIS — K920 Hematemesis: Secondary | ICD-10-CM | POA: Diagnosis not present

## 2022-05-14 DIAGNOSIS — Z23 Encounter for immunization: Secondary | ICD-10-CM | POA: Diagnosis present

## 2022-05-14 DIAGNOSIS — K922 Gastrointestinal hemorrhage, unspecified: Secondary | ICD-10-CM | POA: Diagnosis not present

## 2022-05-14 DIAGNOSIS — Z8616 Personal history of COVID-19: Secondary | ICD-10-CM | POA: Diagnosis not present

## 2022-05-14 DIAGNOSIS — G9340 Encephalopathy, unspecified: Secondary | ICD-10-CM | POA: Diagnosis not present

## 2022-05-14 DIAGNOSIS — N179 Acute kidney failure, unspecified: Secondary | ICD-10-CM | POA: Diagnosis not present

## 2022-05-14 DIAGNOSIS — I5031 Acute diastolic (congestive) heart failure: Secondary | ICD-10-CM | POA: Diagnosis not present

## 2022-05-14 DIAGNOSIS — J4489 Other specified chronic obstructive pulmonary disease: Secondary | ICD-10-CM | POA: Diagnosis not present

## 2022-05-14 DIAGNOSIS — E1110 Type 2 diabetes mellitus with ketoacidosis without coma: Secondary | ICD-10-CM | POA: Diagnosis present

## 2022-05-14 DIAGNOSIS — E1159 Type 2 diabetes mellitus with other circulatory complications: Secondary | ICD-10-CM | POA: Diagnosis not present

## 2022-05-14 DIAGNOSIS — J9601 Acute respiratory failure with hypoxia: Secondary | ICD-10-CM | POA: Diagnosis present

## 2022-05-14 DIAGNOSIS — G4733 Obstructive sleep apnea (adult) (pediatric): Secondary | ICD-10-CM | POA: Diagnosis not present

## 2022-05-14 LAB — BASIC METABOLIC PANEL
Anion gap: 14 (ref 5–15)
Anion gap: 20 — ABNORMAL HIGH (ref 5–15)
Anion gap: 18 — ABNORMAL HIGH (ref 5–15)
BUN: 20 mg/dL (ref 8–23)
BUN: 30 mg/dL — ABNORMAL HIGH (ref 8–23)
BUN: 31 mg/dL — ABNORMAL HIGH (ref 8–23)
CO2: 20 mmol/L — ABNORMAL LOW (ref 22–32)
CO2: 15 mmol/L — ABNORMAL LOW (ref 22–32)
CO2: 15 mmol/L — ABNORMAL LOW (ref 22–32)
Calcium: 7.3 mg/dL — ABNORMAL LOW (ref 8.9–10.3)
Calcium: 6.7 mg/dL — ABNORMAL LOW (ref 8.9–10.3)
Calcium: 6.7 mg/dL — ABNORMAL LOW (ref 8.9–10.3)
Chloride: 99 mmol/L (ref 98–111)
Chloride: 99 mmol/L (ref 98–111)
Chloride: 102 mmol/L (ref 98–111)
Creatinine, Ser: 1.82 mg/dL — ABNORMAL HIGH (ref 0.61–1.24)
Creatinine, Ser: 3.25 mg/dL — ABNORMAL HIGH (ref 0.61–1.24)
Creatinine, Ser: 3.84 mg/dL — ABNORMAL HIGH (ref 0.61–1.24)
GFR, Estimated: 41 mL/min — ABNORMAL LOW (ref >60)
GFR, Estimated: 21 mL/min — ABNORMAL LOW (ref >60)
GFR, Estimated: 17 mL/min — ABNORMAL LOW (ref >60)
Glucose, Bld: 434 mg/dL — ABNORMAL HIGH (ref 70–99)
Glucose, Bld: 579 mg/dL (ref 70–99)
Glucose, Bld: 512 mg/dL (ref 70–99)
Potassium: 3.9 mmol/L (ref 3.5–5.1)
Potassium: 4.6 mmol/L (ref 3.5–5.1)
Potassium: 4.5 mmol/L (ref 3.5–5.1)
Sodium: 133 mmol/L — ABNORMAL LOW (ref 135–145)
Sodium: 134 mmol/L — ABNORMAL LOW (ref 135–145)
Sodium: 135 mmol/L (ref 135–145)

## 2022-05-14 LAB — CBC
HCT: 37.6 % — ABNORMAL LOW (ref 39.0–52.0)
HCT: 29.9 % — ABNORMAL LOW (ref 39.0–52.0)
HCT: 26.9 % — ABNORMAL LOW (ref 39.0–52.0)
Hemoglobin: 12 g/dL — ABNORMAL LOW (ref 13.0–17.0)
Hemoglobin: 9.9 g/dL — ABNORMAL LOW (ref 13.0–17.0)
Hemoglobin: 8.8 g/dL — ABNORMAL LOW (ref 13.0–17.0)
MCH: 33.1 pg (ref 26.0–34.0)
MCH: 34 pg (ref 26.0–34.0)
MCH: 34 pg (ref 26.0–34.0)
MCHC: 31.9 g/dL (ref 30.0–36.0)
MCHC: 33.1 g/dL (ref 30.0–36.0)
MCHC: 32.7 g/dL (ref 30.0–36.0)
MCV: 103.6 fL — ABNORMAL HIGH (ref 80.0–100.0)
MCV: 102.7 fL — ABNORMAL HIGH (ref 80.0–100.0)
MCV: 103.9 fL — ABNORMAL HIGH (ref 80.0–100.0)
Platelets: 104 10*3/uL — ABNORMAL LOW (ref 150–400)
Platelets: 141 10*3/uL — ABNORMAL LOW (ref 150–400)
Platelets: 135 10*3/uL — ABNORMAL LOW (ref 150–400)
RBC: 3.63 MIL/uL — ABNORMAL LOW (ref 4.22–5.81)
RBC: 2.91 MIL/uL — ABNORMAL LOW (ref 4.22–5.81)
RBC: 2.59 MIL/uL — ABNORMAL LOW (ref 4.22–5.81)
RDW: 14.4 % (ref 11.5–15.5)
RDW: 14.1 % (ref 11.5–15.5)
RDW: 14.1 % (ref 11.5–15.5)
WBC: 18.1 10*3/uL — ABNORMAL HIGH (ref 4.0–10.5)
WBC: 27 10*3/uL — ABNORMAL HIGH (ref 4.0–10.5)
WBC: 25.7 10*3/uL — ABNORMAL HIGH (ref 4.0–10.5)
nRBC: 0.1 % (ref 0.0–0.2)
nRBC: 0.1 % (ref 0.0–0.2)
nRBC: 0.2 % (ref 0.0–0.2)

## 2022-05-14 LAB — BLOOD GAS, ARTERIAL
Acid-base deficit: 12.9 mmol/L — ABNORMAL HIGH (ref 0.0–2.0)
Acid-base deficit: 15 mmol/L — ABNORMAL HIGH (ref 0.0–2.0)
Bicarbonate: 14.9 mmol/L — ABNORMAL LOW (ref 20.0–28.0)
Bicarbonate: 13.3 mmol/L — ABNORMAL LOW (ref 20.0–28.0)
FIO2: 100 %
FIO2: 100 %
O2 Saturation: 98.9 %
O2 Saturation: 100 %
Patient temperature: 37
Patient temperature: 37
pCO2 arterial: 40 mmHg (ref 32–48)
pCO2 arterial: 39 mmHg (ref 32–48)
pH, Arterial: 7.18 — CL (ref 7.35–7.45)
pH, Arterial: 7.14 — CL (ref 7.35–7.45)
pO2, Arterial: 99 mmHg (ref 83–108)
pO2, Arterial: 258 mmHg — ABNORMAL HIGH (ref 83–108)

## 2022-05-14 LAB — GLUCOSE, CAPILLARY
Glucose-Capillary: 314 mg/dL — ABNORMAL HIGH (ref 70–99)
Glucose-Capillary: 460 mg/dL — ABNORMAL HIGH (ref 70–99)
Glucose-Capillary: 589 mg/dL (ref 70–99)
Glucose-Capillary: 503 mg/dL (ref 70–99)
Glucose-Capillary: 550 mg/dL (ref 70–99)
Glucose-Capillary: 467 mg/dL — ABNORMAL HIGH (ref 70–99)
Glucose-Capillary: 484 mg/dL — ABNORMAL HIGH (ref 70–99)
Glucose-Capillary: 440 mg/dL — ABNORMAL HIGH (ref 70–99)
Glucose-Capillary: 488 mg/dL — ABNORMAL HIGH (ref 70–99)
Glucose-Capillary: 403 mg/dL — ABNORMAL HIGH (ref 70–99)
Glucose-Capillary: 466 mg/dL — ABNORMAL HIGH (ref 70–99)
Glucose-Capillary: 371 mg/dL — ABNORMAL HIGH (ref 70–99)
Glucose-Capillary: 457 mg/dL — ABNORMAL HIGH (ref 70–99)
Glucose-Capillary: 496 mg/dL — ABNORMAL HIGH (ref 70–99)
Glucose-Capillary: 419 mg/dL — ABNORMAL HIGH (ref 70–99)
Glucose-Capillary: 391 mg/dL — ABNORMAL HIGH (ref 70–99)
Glucose-Capillary: 416 mg/dL — ABNORMAL HIGH (ref 70–99)

## 2022-05-14 LAB — BLOOD GAS, VENOUS
Acid-base deficit: 17.5 mmol/L — ABNORMAL HIGH (ref 0.0–2.0)
Bicarbonate: 15.1 mmol/L — ABNORMAL LOW (ref 20.0–28.0)
Drawn by: 45552
O2 Saturation: 37.6 %
Patient temperature: 37.8
pCO2, Ven: 69 mmHg — ABNORMAL HIGH (ref 44–60)
pH, Ven: 6.95 — CL (ref 7.25–7.43)
pO2, Ven: 32 mmHg (ref 32–45)

## 2022-05-14 LAB — MRSA NEXT GEN BY PCR, NASAL: MRSA by PCR Next Gen: NOT DETECTED

## 2022-05-14 LAB — MAGNESIUM
Magnesium: 2.5 mg/dL — ABNORMAL HIGH (ref 1.7–2.4)
Magnesium: 2.5 mg/dL — ABNORMAL HIGH (ref 1.7–2.4)

## 2022-05-14 LAB — HEMOGLOBIN A1C
Hgb A1c MFr Bld: 11.4 % — ABNORMAL HIGH (ref 4.8–5.6)
Mean Plasma Glucose: 280 mg/dL

## 2022-05-14 LAB — ABO/RH: ABO/RH(D): A POS

## 2022-05-14 LAB — BETA-HYDROXYBUTYRIC ACID: Beta-Hydroxybutyric Acid: 0.84 mmol/L — ABNORMAL HIGH (ref 0.05–0.27)

## 2022-05-14 LAB — LACTIC ACID, PLASMA: Lactic Acid, Venous: >9 mmol/L (ref 0.5–1.9)

## 2022-05-14 LAB — PREPARE RBC (CROSSMATCH)

## 2022-05-14 MED ORDER — SODIUM BICARBONATE 8.4 % IV SOLN
50.0000 meq | Freq: Once | INTRAVENOUS | Status: AC
  Administered 2022-05-14: 50 meq via INTRAVENOUS

## 2022-05-14 MED ORDER — SODIUM CHLORIDE 0.9 % IV SOLN: INTRAVENOUS | Status: DC

## 2022-05-14 MED ORDER — EPINEPHRINE 0.3 MG/0.3ML IJ SOAJ
INTRAMUSCULAR | Status: AC
  Filled 2022-05-14: qty 0.3

## 2022-05-14 MED ORDER — SODIUM BICARBONATE 8.4 % IV SOLN
INTRAVENOUS | Status: AC
  Administered 2022-05-14: 100 meq via INTRAVENOUS
  Filled 2022-05-14: qty 100

## 2022-05-14 MED ORDER — ALBUMIN HUMAN 5 % IV SOLN
25.0000 g | Freq: Once | INTRAVENOUS | Status: AC
  Administered 2022-05-14: 25 g via INTRAVENOUS
  Filled 2022-05-14: qty 500

## 2022-05-14 MED ORDER — DEXTROSE 50 % IV SOLN: 0.0000 mL | INTRAVENOUS | Status: DC | PRN

## 2022-05-14 MED ORDER — EPINEPHRINE HCL 5 MG/250ML IV SOLN IN NS
0.5000 ug/min | INTRAVENOUS | Status: DC
  Administered 2022-05-14 (×2): 14.5 ug/min via INTRAVENOUS
  Administered 2022-05-14 – 2022-05-15 (×4): 20 ug/min via INTRAVENOUS
  Administered 2022-05-16: 11 ug/min via INTRAVENOUS
  Administered 2022-05-17: 2 ug/min via INTRAVENOUS
  Filled 2022-05-14 (×2): qty 250

## 2022-05-14 MED ORDER — MIDAZOLAM HCL 2 MG/2ML IJ SOLN
1.0000 mg | INTRAMUSCULAR | Status: DC | PRN
  Administered 2022-05-14 (×2): 2 mg via INTRAVENOUS
  Administered 2022-05-14: 1 mg via INTRAVENOUS
  Administered 2022-05-15 – 2022-05-18 (×7): 2 mg via INTRAVENOUS
  Administered 2022-05-18: 1 mg via INTRAVENOUS
  Administered 2022-05-18: 2 mg via INTRAVENOUS
  Administered 2022-05-18: 1 mg via INTRAVENOUS
  Administered 2022-05-19 – 2022-05-27 (×9): 2 mg via INTRAVENOUS
  Filled 2022-05-14 (×25): qty 2

## 2022-05-14 MED ORDER — NOREPINEPHRINE 4 MG/250ML-% IV SOLN
INTRAVENOUS | Status: AC
  Administered 2022-05-14: 20 ug/min via INTRAVENOUS
  Filled 2022-05-14: qty 250

## 2022-05-14 MED ORDER — EPINEPHRINE 1 MG/10ML IJ SOSY
PREFILLED_SYRINGE | INTRAMUSCULAR | Status: AC
  Filled 2022-05-14: qty 30

## 2022-05-14 MED ORDER — SODIUM BICARBONATE 4.2 % IV SOLN: 50.0000 meq | Freq: Once | INTRAVENOUS | Status: DC

## 2022-05-14 MED ORDER — LACTATED RINGERS IV BOLUS
1000.0000 mL | INTRAVENOUS | Status: AC
  Administered 2022-05-14 (×2): 1000 mL via INTRAVENOUS

## 2022-05-14 MED ORDER — FENTANYL 2500MCG IN NS 250ML (10MCG/ML) PREMIX INFUSION
0.0000 ug/h | INTRAVENOUS | Status: DC
  Administered 2022-05-15: 50 ug/h via INTRAVENOUS
  Administered 2022-05-15: 200 ug/h via INTRAVENOUS
  Administered 2022-05-16: 100 ug/h via INTRAVENOUS
  Administered 2022-05-17: 150 ug/h via INTRAVENOUS
  Administered 2022-05-18: 100 ug/h via INTRAVENOUS
  Administered 2022-05-18: 175 ug/h via INTRAVENOUS
  Administered 2022-05-19: 200 ug/h via INTRAVENOUS
  Administered 2022-05-19: 400 ug/h via INTRAVENOUS
  Filled 2022-05-14 (×9): qty 250

## 2022-05-14 MED ORDER — INSULIN REGULAR(HUMAN) IN NACL 100-0.9 UT/100ML-% IV SOLN
INTRAVENOUS | Status: DC
  Administered 2022-05-14: 14 [IU]/h via INTRAVENOUS
  Administered 2022-05-14: 28 [IU]/h via INTRAVENOUS
  Administered 2022-05-14: 30 [IU]/h via INTRAVENOUS
  Administered 2022-05-15: 13 [IU]/h via INTRAVENOUS
  Administered 2022-05-15: 30 [IU]/h via INTRAVENOUS
  Administered 2022-05-15: 23 [IU]/h via INTRAVENOUS
  Filled 2022-05-14 (×3): qty 100

## 2022-05-14 MED ORDER — HYDROCORTISONE SOD SUC (PF) 100 MG IJ SOLR
100.0000 mg | Freq: Three times a day (TID) | INTRAMUSCULAR | Status: DC
  Administered 2022-05-14 – 2022-05-18 (×14): 100 mg via INTRAVENOUS
  Filled 2022-05-14 (×14): qty 2

## 2022-05-14 MED ORDER — SODIUM BICARBONATE 8.4 % IV SOLN
INTRAVENOUS | Status: AC
  Filled 2022-05-14: qty 100

## 2022-05-14 MED ORDER — INSULIN ASPART 100 UNIT/ML IJ SOLN
0.0000 [IU] | INTRAMUSCULAR | Status: DC
  Administered 2022-05-14: 20 [IU] via SUBCUTANEOUS

## 2022-05-14 MED ORDER — STERILE WATER FOR INJECTION IV SOLN
INTRAVENOUS | Status: AC
  Filled 2022-05-14 (×3): qty 1000

## 2022-05-14 MED ORDER — VASOPRESSIN 20 UNITS/100 ML INFUSION FOR SHOCK
0.0000 [IU]/min | INTRAVENOUS | Status: DC
  Administered 2022-05-14 – 2022-05-15 (×4): 0.04 [IU]/min via INTRAVENOUS
  Administered 2022-05-16 – 2022-05-17 (×3): 0.03 [IU]/min via INTRAVENOUS
  Administered 2022-05-17: 0.02 [IU]/min via INTRAVENOUS
  Administered 2022-05-18: 0.04 [IU]/min via INTRAVENOUS
  Filled 2022-05-14 (×11): qty 100

## 2022-05-14 MED ORDER — VASOPRESSIN 20 UNITS/100 ML INFUSION FOR SHOCK
INTRAVENOUS | Status: AC
  Administered 2022-05-14: 0.04 [IU]/min via INTRAVENOUS
  Filled 2022-05-14: qty 100

## 2022-05-14 MED ORDER — SODIUM CHLORIDE 0.9 % IV BOLUS
1000.0000 mL | Freq: Once | INTRAVENOUS | Status: AC
  Administered 2022-05-14: 1000 mL via INTRAVENOUS

## 2022-05-14 MED ORDER — PROPOFOL 1000 MG/100ML IV EMUL
INTRAVENOUS | Status: AC
  Filled 2022-05-14: qty 100

## 2022-05-14 MED ORDER — FENTANYL 2500MCG IN NS 250ML (10MCG/ML) PREMIX INFUSION: 0.0000 ug/h | INTRAVENOUS | Status: DC

## 2022-05-14 MED ORDER — NOREPINEPHRINE 4 MG/250ML-% IV SOLN
0.0000 ug/min | INTRAVENOUS | Status: DC
  Administered 2022-05-14: 40 ug/min via INTRAVENOUS
  Filled 2022-05-14: qty 500

## 2022-05-14 MED ORDER — FENTANYL 2500MCG IN NS 250ML (10MCG/ML) PREMIX INFUSION
INTRAVENOUS | Status: AC
  Administered 2022-05-14: 50 ug/h
  Filled 2022-05-14: qty 250

## 2022-05-14 MED ORDER — POTASSIUM CHLORIDE 10 MEQ/100ML IV SOLN
10.0000 meq | INTRAVENOUS | Status: AC
  Administered 2022-05-14 (×2): 10 meq via INTRAVENOUS
  Filled 2022-05-14 (×2): qty 100

## 2022-05-14 MED ORDER — SODIUM CHLORIDE 0.9% IV SOLUTION: Freq: Once | INTRAVENOUS | Status: DC

## 2022-05-14 MED ORDER — PROPOFOL 1000 MG/100ML IV EMUL
5.0000 ug/kg/min | INTRAVENOUS | Status: DC
  Administered 2022-05-14: 20 ug/kg/min via INTRAVENOUS
  Administered 2022-05-14: 15 ug/kg/min via INTRAVENOUS
  Administered 2022-05-14: 20 ug/kg/min via INTRAVENOUS
  Administered 2022-05-15: 15 ug/kg/min via INTRAVENOUS
  Filled 2022-05-14 (×4): qty 100

## 2022-05-14 MED ORDER — LACTATED RINGERS IV SOLN: INTRAVENOUS | Status: DC

## 2022-05-14 MED ORDER — EPINEPHRINE HCL 5 MG/250ML IV SOLN IN NS
0.5000 ug/min | INTRAVENOUS | Status: DC
  Administered 2022-05-14: 0.5 ug/min via INTRAVENOUS
  Filled 2022-05-14: qty 250

## 2022-05-14 MED ORDER — EPINEPHRINE 1 MG/10ML IJ SOSY
PREFILLED_SYRINGE | INTRAMUSCULAR | Status: AC
  Filled 2022-05-14: qty 20

## 2022-05-14 MED ORDER — NOREPINEPHRINE 16 MG/250ML-% IV SOLN
0.0000 ug/min | INTRAVENOUS | Status: DC
  Administered 2022-05-14 (×2): 40 ug/min via INTRAVENOUS
  Administered 2022-05-14: 47 ug/min via INTRAVENOUS
  Administered 2022-05-15 (×3): 60 ug/min via INTRAVENOUS
  Administered 2022-05-15: 60.053 ug/min via INTRAVENOUS
  Administered 2022-05-16: 16 ug/min via INTRAVENOUS
  Administered 2022-05-16: 24 ug/min via INTRAVENOUS
  Administered 2022-05-18: 3 ug/min via INTRAVENOUS
  Filled 2022-05-14 (×10): qty 250

## 2022-05-14 MED ORDER — CHLORHEXIDINE GLUCONATE CLOTH 2 % EX PADS
6.0000 | MEDICATED_PAD | Freq: Every day | CUTANEOUS | Status: DC
  Administered 2022-05-14 – 2022-06-12 (×32): 6 via TOPICAL

## 2022-05-14 MED ORDER — SODIUM BICARBONATE 8.4 % IV SOLN
INTRAVENOUS | Status: AC
  Filled 2022-05-14: qty 150

## 2022-05-14 MED ORDER — SODIUM BICARBONATE 8.4 % IV SOLN: 100.0000 meq | Freq: Once | INTRAVENOUS | Status: AC

## 2022-05-14 MED ORDER — MOMETASONE FURO-FORMOTEROL FUM 200-5 MCG/ACT IN AERO: 2.0000 | INHALATION_SPRAY | Freq: Two times a day (BID) | RESPIRATORY_TRACT | Status: DC | PRN

## 2022-05-14 MED ORDER — SODIUM CHLORIDE 0.9 % IV SOLN
0.5000 ug/min | INTRAVENOUS | Status: DC
  Filled 2022-05-14: qty 5

## 2022-05-14 MED ORDER — TENECTEPLASE 50 MG IV KIT
50.0000 mg | PACK | Freq: Once | INTRAVENOUS | Status: AC
  Administered 2022-05-14: 50 mg via INTRAVENOUS
  Filled 2022-05-14: qty 10

## 2022-05-14 MED ORDER — EPINEPHRINE 1 MG/10ML IJ SOSY
PREFILLED_SYRINGE | INTRAMUSCULAR | Status: AC
  Filled 2022-05-14: qty 10

## 2022-05-14 MED ORDER — MIDAZOLAM HCL 2 MG/2ML IJ SOLN
INTRAMUSCULAR | Status: AC
  Administered 2022-05-14: 2 mg
  Filled 2022-05-14: qty 2

## 2022-05-14 MED ORDER — SODIUM BICARBONATE 8.4 % IV SOLN
100.0000 meq | Freq: Once | INTRAVENOUS | Status: AC
  Administered 2022-05-14: 100 meq via INTRAVENOUS

## 2022-05-14 MED ORDER — INSULIN ASPART 100 UNIT/ML IV SOLN
10.0000 [IU] | Freq: Once | INTRAVENOUS | Status: AC
  Administered 2022-05-14: 10 [IU] via INTRAVENOUS

## 2022-05-14 MED ORDER — SODIUM BICARBONATE 8.4 % IV SOLN
INTRAVENOUS | Status: AC
  Filled 2022-05-14: qty 50

## 2022-05-14 MED ORDER — DEXTROSE IN LACTATED RINGERS 5 % IV SOLN: INTRAVENOUS | Status: DC

## 2022-05-14 NOTE — Progress Notes (Signed)
  X-cover Note: Bedside RN notified me pt continues to be hypotensive. On max dose of levophed 20 mcg/min.  Added vasopressin gtts. Discussed with Dr. Merrily Pew with PCCM. PCCM does not round at [facility] on the weekend.  Pt not stable enough for transport.  Pt with enlarging right groin hematoma. Not surprising due to right femoral arterial puncture while attempting right femoral central line and after pt received TNK.  Discussed with PCCM. Daytime rounder to call if pt survives this AM and hemodynamically more stable about transfer to [facility].   Carollee Herter, DO [facility]

## 2022-05-14 NOTE — Progress Notes (Addendum)
PROGRESS NOTE    Frank Shelton.  UXL:[id number] DOB: 09/22/1960 DOA: 05/12/2022 PCP: Vanessa [place], FNP   Brief Narrative:    Frank Shelton. is a 61 y.o. male with medical history significant for  PE and DVT, DM, HTN, Gout, COPD, OSA.  Patient presented to the ED with complaints of difficulty breathing, chest pain started about 1 AM this morning.  Patient was admitted with pulmonary embolism and is now also noted to have left lower extremity DVT that developed after missing 3 doses of Xarelto at home.  He unfortunately had a cardiac arrest overnight on 12/29 with ROSC and is now intubated on ventilator and started on 3 pressors with ongoing soft blood pressure readings and inability to transfer.  Poor short-term prognosis noted with anticipated in-hospital death should he not wean off the pressors. Attempting to wean at this time with discontinuation of Propofol. Noted to have severe anion gap acidosis and patient now in DKA.  Assessment & Plan:   Principal Problem:   Pulmonary embolism (HCC) Active Problems:   GOUT   OBSTRUCTIVE SLEEP APNEA   Essential hypertension   Other specified chronic obstructive pulmonary disease   Hypercoagulopathy (HCC)   DM type 2 causing vascular disease (HCC)  Assessment and Plan:   Refractory circulatory shock status post PEA arrest secondary to pulmonary embolism (HCC) with DVT History of PE and hypercoagulable state, last dose of Xarelto 12/24, missed 3 days of Xarelto before onset of symptoms.  Presenting with chest pain, difficulty breathing, dizziness.  CTA chest shows multiple PE, possible chronic right heart failure.  Has been on Xarelto for well over 10 years.  Reports prior to xarelto he was on warfarin- since 1986 but his blood was never thin enough.  He has bilateral lower extremity edema secondary to venous hypertension-saw Dr. Tawanna Cooler 2018. -Patient was on full dose Lovenox but then underwent cardiac arrest on 12/29 -Now on 3 pressors  with shock that is not improving -Started on hydrocortisone -2D echocardiogram 12/29 did not demonstrate any RV strain and vital signs have remained stable until the point of cardiac arrest -Alteplase administered and now with large hematoma near central venous line placement to right groin -Continue to wean pressors as tolerated and if this is possible, may consider transfer to [facility] service for further management as long as patient has intact neurological function.  At this time, this is difficult to assess given his ongoing sedation; and he is too unstable for transfer. May consider transfer over the next 24 hours if further improvements noted.   DM type 2 causing vascular disease (HCC) now with DKA Bolus IVF and maintain on aggressive IVF IV insulin NPO Repeat labs and follow Hemoglobin A1c 11.4% Should have improved BP response with improvement in acidosis  Acute anemia Downtrending after administration of tenecteplase Stop Lovenox for now Monitor CBC and transfuse for Hgb<7   Other specified chronic obstructive pulmonary disease Quit smoking cigarettes 16 years ago. -Resume home bronchodilator regimen.   Essential hypertension currently in shock Continue to monitor   OBSTRUCTIVE SLEEP APNEA Currently intubated   GOUT Resume allopurinol   Morbid obesity BMI 45.59    DVT prophylaxis:Full dose lovenox, will discontinue due to hemoptysis and worsening anemia Code Status: DNR Family Communication: Discussed with sons at bedside 12/30 Disposition Plan:  Status is: Inpatient Remains inpatient appropriate because: IV medications/vent support.  Consultants:  Discussed with hematology Dr. Kirtland Bouchard 12/29 PCCM called overnight 12/29   Procedures:  Intubation 12/30  Central venous line placement 12/30   Antimicrobials:  None    Subjective: Patient seen and evaluated today and is currently sedated and intubated on the ventilator with multiple pressors ongoing.  He suffered  cardiac arrest overnight with ROSC and now has poor prognosis.  Objective: Vitals:   05/14/22 1425 05/14/22 1430 05/14/22 1440 05/14/22 1445  BP: (!) 85/57 (!) 65/38 (!) 95/50 (!) 88/55  Pulse: (!) 107 (!) 106 (!) 106 (!) 107  Resp: 16 19 20 19   Temp:      TempSrc:      SpO2: 100% 100% 100% 100%  Weight:      Height:        Intake/Output Summary (Last 24 hours) at 05/14/2022 1505 Last data filed at 05/14/2022 1450 Gross per 24 hour  Intake 3016.06 ml  Output --  Net 3016.06 ml   Filed Weights   05/12/22 1139 05/12/22 2351  Weight: (!) 149.7 kg (!) 169.9 kg    Examination:  General exam: Appears sedated/unresponsive Respiratory system: Clear to auscultation. Respiratory effort normal.  Currently intubated on mechanical ventilator Cardiovascular system: S1 & S2 heard, RRR.  Gastrointestinal system: Abdomen is soft Central nervous system: Sedated/unresponsive Extremities: Bilateral edema with venous stasis ulcer changes    Data Reviewed: I have personally reviewed following labs and imaging studies  CBC: Recent Labs  Lab 05/12/22 1200 05/13/22 0457 05/14/22 0434  WBC 9.4 8.8 18.1*  HGB 15.3 13.4 12.0*  HCT 45.7 40.2 37.6*  MCV 97.0 97.6 103.6*  PLT 137* 129* 104*   Basic Metabolic Panel: Recent Labs  Lab 05/12/22 1200 05/13/22 0457 05/14/22 0434  NA 134* 135 133*  K 4.1 3.5 3.9  CL 103 102 99  CO2 24 23 20*  GLUCOSE 384* 245* 434*  BUN 13 15 20   CREATININE 1.06 0.98 1.82*  CALCIUM 8.6* 8.3* 7.3*  MG  --   --  2.5*   GFR: Estimated Creatinine Clearance: 70.5 mL/min (A) (by C-G formula based on SCr of 1.82 mg/dL (H)). Liver Function Tests: No results for input(s): "AST", "ALT", "ALKPHOS", "BILITOT", "PROT", "ALBUMIN" in the last 168 hours. No results for input(s): "LIPASE", "AMYLASE" in the last 168 hours. No results for input(s): "AMMONIA" in the last 168 hours. Coagulation Profile: No results for input(s): "INR", "PROTIME" in the last 168  hours. Cardiac Enzymes: No results for input(s): "CKTOTAL", "CKMB", "CKMBINDEX", "TROPONINI" in the last 168 hours. BNP (last 3 results) No results for input(s): "PROBNP" in the last 8760 hours. HbA1C: Recent Labs    05/12/22 1223  HGBA1C 11.4*   CBG: Recent Labs  Lab 05/13/22 2038 05/14/22 0145 05/14/22 0758 05/14/22 1210 05/14/22 1349  GLUCAP 325* 314* 460* 589* 503*   Lipid Profile: No results for input(s): "CHOL", "HDL", "LDLCALC", "TRIG", "CHOLHDL", "LDLDIRECT" in the last 72 hours. Thyroid Function Tests: No results for input(s): "TSH", "T4TOTAL", "FREET4", "T3FREE", "THYROIDAB" in the last 72 hours. Anemia Panel: No results for input(s): "VITAMINB12", "FOLATE", "FERRITIN", "TIBC", "IRON", "RETICCTPCT" in the last 72 hours. Sepsis Labs: No results for input(s): "PROCALCITON", "LATICACIDVEN" in the last 168 hours.  Recent Results (from the past 240 hour(s))  MRSA Next Gen by PCR, Nasal     Status: None   Collection Time: 05/14/22  6:10 AM   Specimen: Nasal Mucosa; Nasal Swab  Result Value Ref Range Status   MRSA by PCR Next Gen NOT DETECTED NOT DETECTED Final    Comment: (NOTE) The GeneXpert MRSA Assay (FDA approved for NASAL specimens only), is  one component of a comprehensive MRSA colonization surveillance program. It is not intended to diagnose MRSA infection nor to guide or monitor treatment for MRSA infections. Test performance is not FDA approved in patients less than 52 years old. Performed at [facility], [address] [place], [place] [address]          Radiology Studies: DG CHEST PORT 1 VIEW  Result Date: 05/14/2022 CLINICAL DATA:  61 year old male status post intubation and central venous catheter placement. EXAM: PORTABLE CHEST 1 VIEW COMPARISON:  Chest x-ray 05/12/2022. FINDINGS: An endotracheal tube is in place with tip 3.7 cm above the carina. No definite central venous catheter identified. Transcutaneous defibrillator pad projecting over  the lower left hemithorax. Mild elevation of the right hemidiaphragm. New opacity in the right mid to lower lung partially obscuring the right heart border. Opacity in the medial aspect of the right upper lobe also noted. Left lung appears clear. No pneumothorax. No evidence of pulmonary edema. Heart size is normal. Upper mediastinal contours are within normal limits. IMPRESSION: 1. Support apparatus, as above. 2. Multifocal airspace consolidation in the right lung, concerning for developing right upper and right middle lobe bronchopneumonia. Electronically Signed   By: Trudie Reed M.D.   On: 05/14/2022 06:03   ECHOCARDIOGRAM COMPLETE  Result Date: 05/13/2022    ECHOCARDIOGRAM REPORT   Patient Name:   Keaundre Thelin. Date of Exam: 05/13/2022 Medical Rec #:  [id number]          Height:       76.0 in Accession #:    [id number]         Weight:       374.5 lb Date of Birth:  1960-08-08          BSA:          2.892 m Patient Age:    61 years           BP:           101/60 mmHg Patient Gender: M                  HR:           68 bpm. [facility] Procedure: 2D Echo, Cardiac Doppler and Color Doppler Indications:    Pulmonary Embolus I26.09  History:        Patient has prior history of Echocardiogram examinations, most                 recent 05/17/2020. Previous Myocardial Infarction, Stroke and                 COPD; Risk Factors:Hypertension, Diabetes, Former Smoker and                 Sleep Apnea.  Sonographer:    Celesta Gentile RCS Referring Phys: [phone] Heloise Beecham EMOKPAE IMPRESSIONS  1. Left ventricular ejection fraction, by estimation, is 60 to 65%. The left ventricle has normal function. The left ventricle has no regional wall motion abnormalities. There is moderate left ventricular hypertrophy. Left ventricular diastolic parameters were normal.  2. Right ventricular systolic function is normal. The right ventricular size is normal.  3. The mitral valve is abnormal. No evidence of mitral valve  regurgitation. No evidence of mitral stenosis.  4. The aortic valve is tricuspid. Aortic valve regurgitation is not visualized. No aortic stenosis is present.  5. Aortic dilatation noted. There is mild dilatation of the aortic root, measuring 40 mm.  6. The inferior vena cava is normal in size with greater than 50% respiratory variability, suggesting right atrial pressure of 3 mmHg. FINDINGS  Left Ventricle: Left ventricular ejection fraction, by estimation, is 60 to 65%. The left ventricle has normal function. The left ventricle has no regional wall motion abnormalities. Definity contrast agent was given IV to delineate the left ventricular  endocardial borders. The left ventricular internal cavity size was normal in size. There is moderate left ventricular hypertrophy. Left ventricular diastolic parameters were normal. Right Ventricle: The right ventricular size is normal. No increase in right ventricular wall thickness. Right ventricular systolic function is normal. Left Atrium: Left atrial size was normal in size. Right Atrium: Right atrial size was normal in size. Pericardium: There is no evidence of pericardial effusion. Mitral Valve: The mitral valve is abnormal. There is mild thickening of the mitral valve leaflet(s). There is mild calcification of the mitral valve leaflet(s). No evidence of mitral valve regurgitation. No evidence of mitral valve stenosis. Tricuspid Valve: The tricuspid valve is normal in structure. Tricuspid valve regurgitation is not demonstrated. No evidence of tricuspid stenosis. Aortic Valve: The aortic valve is tricuspid. Aortic valve regurgitation is not visualized. No aortic stenosis is present. Pulmonic Valve: The pulmonic valve was normal in structure. Pulmonic valve regurgitation is not visualized. No evidence of pulmonic stenosis. Aorta: Aortic dilatation noted. There is mild dilatation of the aortic root, measuring 40 mm. Venous: The inferior vena cava is normal in size with  greater than 50% respiratory variability, suggesting right atrial pressure of 3 mmHg. IAS/Shunts: The interatrial septum was not well visualized.  LEFT VENTRICLE PLAX 2D LVIDd:         4.10 cm   Diastology LVIDs:         2.60 cm   LV e' medial:    6.85 cm/s LV PW:         1.30 cm   LV E/e' medial:  8.8 LV IVS:        1.40 cm   LV e' lateral:   7.83 cm/s LVOT diam:     2.30 cm   LV E/e' lateral: 7.7 LV SV:         74 LV SV Index:   26 LVOT Area:     4.15 cm  RIGHT VENTRICLE RV S prime:     13.10 cm/s TAPSE (M-mode): 2.2 cm LEFT ATRIUM              Index        RIGHT ATRIUM           Index LA diam:        3.00 cm  1.04 cm/m   RA Area:     17.90 cm LA Vol (A2C):   85.3 ml  29.50 ml/m  RA Volume:   43.90 ml  15.18 ml/m LA Vol (A4C):   102.0 ml 35.27 ml/m LA Biplane Vol: 95.1 ml  32.88 ml/m  AORTIC VALVE LVOT Vmax:   80.70 cm/s LVOT Vmean:  55.500 cm/s LVOT VTI:    0.179 m  AORTA Ao Root diam: 4.00 cm MITRAL VALVE MV Area (PHT): 2.69 cm    SHUNTS MV Decel Time: 282 msec    Systemic VTI:  0.18 m MV E velocity: 60.30 cm/s  Systemic Diam: 2.30 cm MV A velocity: 52.00 cm/s MV E/A ratio:  1.16 Charlton Haws MD Electronically signed by Charlton Haws MD Signature Date/Time: 05/13/2022/3:46:04 PM    Final    US Venous  Img Lower Bilateral (DVT)  Result Date: 05/13/2022 CLINICAL DATA:  Pulmonary embolism. Prior history of DVT in the 1980s. Assess for residual DVT EXAM: BILATERAL LOWER EXTREMITY VENOUS DOPPLER ULTRASOUND TECHNIQUE: Gray-scale sonography with graded compression, as well as color Doppler and duplex ultrasound were performed to evaluate the lower extremity deep venous systems from the level of the common femoral vein and including the common femoral, femoral, profunda femoral, popliteal and calf veins including the posterior tibial, peroneal and gastrocnemius veins when visible. The superficial great saphenous vein was also interrogated. Spectral Doppler was utilized to evaluate flow at rest and with distal  augmentation maneuvers in the common femoral, femoral and popliteal veins. COMPARISON:  None Available. FINDINGS: RIGHT LOWER EXTREMITY Common Femoral Vein: No evidence of thrombus. Normal compressibility, respiratory phasicity and response to augmentation. Saphenofemoral Junction: No evidence of thrombus. Normal compressibility and flow on color Doppler imaging. Profunda Femoral Vein: No evidence of thrombus. Normal compressibility and flow on color Doppler imaging. Femoral Vein: No evidence of thrombus. Normal compressibility, respiratory phasicity and response to augmentation. Popliteal Vein: No evidence of thrombus. Normal compressibility, respiratory phasicity and response to augmentation. Calf Veins: No evidence of thrombus. Normal compressibility and flow on color Doppler imaging. Superficial Great Saphenous Vein: Thrombus is present within the great saphenous vein. Thrombus is nonocclusive and likely chronic in nature. Venous Reflux:  None. Other Findings:  None. LEFT LOWER EXTREMITY Common Femoral Vein: Abnormal. The vessel is noncompressible, expanded and filled with low-level internal echoes. No evidence of flow on color Doppler imaging. Findings are consistent with acute occlusive DVT. Saphenofemoral Junction: Nonocclusive DVT extends into the saphenofemoral junction. Profunda Femoral Vein: Occlusive DVT extends into the profunda femoral vein. Femoral Vein: Occlusive thrombus extends throughout the femoral vein in the left leg. Popliteal Vein: Occlusive thrombus extends through the popliteal vein. Calf Veins: Occlusive thrombus extends into the posterior tibial veins. Superficial Great Saphenous Vein: No evidence of thrombus. Normal compressibility. Venous Reflux:  None. Other Findings:  None. IMPRESSION: 1. Positive for extensive acute occlusive left lower extremity DVT beginning at the common femoral vein and extending throughout the leg into the calf. 2. Positive for chronic appearing nonocclusive  superficial thrombus within the right great saphenous vein. 3. No evidence of deep venous thrombosis in the right lower extremity. Electronically Signed   By: Malachy Moan M.D.   On: 05/13/2022 10:47        Scheduled Meds:  allopurinol  300 mg Oral Daily   Chlorhexidine Gluconate Cloth  6 each Topical Daily   EPINEPHrine       EPINEPHrine       EPINEPHrine       EPINEPHrine       hydrocortisone sod succinate (SOLU-CORTEF) inj  100 mg Intravenous Q8H   insulin aspart  0-20 Units Subcutaneous Q4H   insulin glargine-yfgn  20 Units Subcutaneous Daily   levothyroxine  200 mcg Oral QAC breakfast   mometasone-formoterol  2 puff Inhalation BID   Continuous Infusions:  sodium chloride 100 mL/hr at 05/14/22 1450   epinephrine 14.5 mcg/min (05/14/22 1451)   fentaNYL infusion INTRAVENOUS 50 mcg/hr (05/14/22 1450)   norepinephrine (LEVOPHED) Adult infusion 40 mcg/min (05/14/22 1450)   propofol (DIPRIVAN) infusion 15 mcg/kg/min (05/14/22 1450)   vasopressin 0.04 Units/min (05/14/22 1450)     LOS: 0 days    Critical care time: 60 minutes spent with family discussion, documentation, and labs.    Madelein Mahadeo Hoover Brunette, DO [facility]  If 7PM-7AM, please contact night-coverage [email or web address]  05/14/2022, 3:05 PM

## 2022-05-14 NOTE — Progress Notes (Signed)
eLink Physician-Brief Progress Note Patient Name: Frank Shelton. DOB: 1961-01-28 MRN: [id number]   Date of Service  05/14/2022  HPI/Events of Note  PH 7.14  eICU Interventions  Bicarb 100 meq iv push followed by Bicarb gtt @ 100 ml / hour.        Migdalia Dk 05/14/2022, 9:46 PM

## 2022-05-14 NOTE — Progress Notes (Signed)
eLink Physician-Brief Progress Note Patient Name: Frank Shelton. DOB: October 31, 1960 MRN: [id number]   Date of Service  05/14/2022  HPI/Events of Note  Patient with sub-optimal sedation, metabolic acidosis, and a right neck hematoma, hemoglobin 8.8 gm / dl (down 1 gm from last measurement).  eICU Interventions  Fentanyl gtt to be started, DKA protocol + PRN Bicarb for profound acidosis, pressure dressing for right neck hematoma.        Migdalia Dk 05/14/2022, 8:47 PM

## 2022-05-14 NOTE — Progress Notes (Addendum)
Around 0230 Sandy, Charity fundraiser and this Clinical research associate went in patient room and he wanted to ambulate to the bathroom. Using FWW and 2 person assist patient ambulated to bathroom. He became very short of breath and was sitting on the toilet. I instructed patient to take deep breaths in his nose and out pursed lips. Patient did this with no recovery in breathing. He looked at me and said "I don't think this was a good idea" and then he became unresponsive while sitting on the toilet. This Clinical research associate yelled for assistance and [place], RN pulled code blue button and came to assist in the bathroom with patient. It took 3 people to lower patient to the floor. CPR initiated. Patient transferred to ICU room 11.

## 2022-05-14 NOTE — Progress Notes (Signed)
Pt steadily declined after arrival to ICU, requiring 3 pressors by the end of shift. Pt did receive 1xL bolus normal saline, pt does have 3x hematomas to the lt groin, rt groin, and rt neck, Dr. Imogene Burn aware and seen pt. Family at bedside and updated bedside report done with [facility] RN.

## 2022-05-14 NOTE — Procedures (Signed)
Procedure: left femoral CVL Operator: Carollee Herter, DO Desc: Code Blue called overnight. After about 30-40 mins of CPR/ACLS, pt had ROSC. Pt was given TNKase due to admission Dx of PE and DVT.  Unable to get IV access. Multiple attempts made in right groin, right IJ and left groin.  Inadvertent right femoral artery puncture but this proved that pt did indeed have a pulse. At one point during the resuscitation attempt, it was unclear if pt had a pulse.  Finally I was able to puncture the left femoral vein with U/S guidance and with much difficulty able to thread wire into left femoral vein. 3 lumen CVL placed under emergent conditions using modified seldinger technique. Catheter sutured to skin at the hub and covered in dressing.  CVL ready for immediate use.  Carollee Herter, DO [facility]

## 2022-05-14 NOTE — Progress Notes (Signed)
eLink Physician-Brief Progress Note Patient Name: Frank Shelton. DOB: [birth year] MRN: [id number]   Date of Service  05/14/2022  HPI/Events of Note  Hypotension.  eICU Interventions  Albumin 5 % 500 ml iv bolus pending transfusion of one unit of PRBC that has been ordered.        Migdalia Dk 05/14/2022, 9:23 PM

## 2022-05-14 NOTE — ED Provider Notes (Signed)
ED PROVIDER NOTE:  I was called to the floor for a CODE BLUE that occurred early this morning.  I arrived to the room to find the patient unresponsive on the bathroom floor with CPR already in progress.  From what I was told, patient was admitted for multiple pulmonary emboli.  He got up to go to the bathroom when he began to feel short of breath, then became unresponsive and was lowered to the floor by the nursing staff.  He was found to be in PEA and chest compressions were initiated.  The initial phases of the code were performed on the bathroom floor during which time patient received multiple doses of epinephrine and continued chest compressions.  Cardiac rhythm that my initial assessment was PEA.  We were eventually able to obtain ROSC, then the patient was lifted from the bathroom floor into the bed.  Resuscitative efforts were continued there.  The patient was intubated by myself using a #4 MacIntosh GlideScope blade.  The cords were somewhat difficult to identify secondary to patient body habitus, but on the second attempt, a 7.5 endotracheal tube was able to be placed.  Tube placement confirmed with direct visualization, end-tidal CO2, and auscultation over the stomach and lungs.  Resuscitative efforts were made in collaboration with Dr. Imogene Burn from the hospitalist service.  Given the patient's admission history for pulmonary embolism, I highly suspect PE as the etiology for the patient's decompensation.  Dr. Imogene Burn and I were in agreement to push TNK.  This medication was given, then shortly afterward blood pressures normalized and ROSC was achieved.  CRITICAL CARE Performed by: Geoffery Lyons Total critical care time: 45 minutes Critical care time was exclusive of separately billable procedures and treating other patients. Critical care was necessary to treat or prevent imminent or life-threatening deterioration. Critical care was time spent personally by me on the following activities:  development of treatment plan with patient and/or surrogate as well as nursing, discussions with consultants, evaluation of patient's response to treatment, examination of patient, obtaining history from patient or surrogate, ordering and performing treatments and interventions, ordering and review of laboratory studies, ordering and review of radiographic studies, pulse oximetry and re-evaluation of patient's condition.    Geoffery Lyons, MD 05/14/22 [phone]

## 2022-05-14 NOTE — Progress Notes (Signed)
eLink Physician-Brief Progress Note Patient Name: Frank Shelton. DOB: 08/08/1960 MRN: [id number]   Date of Service  05/14/2022  HPI/Events of Note  Patient admitted to the ICU with pulmonary embolism and acute respiratory failure, subsequently went into cardiopulmonary arrest while on the commode and received ACLS protocol resuscitation to ROSC, he was intubated and transferred to the ICU.  eICU Interventions  New Patient Evaluation.        Migdalia Dk 05/14/2022, 4:33 AM

## 2022-05-14 NOTE — Progress Notes (Signed)
Assumed care from [facility], pt currently on Insulin, Levo, Epi, Vaso, LR and Propofol. Pt given 2mg  Versed during report as pt is stacking breaths on vent, vent rate set at RR 32 current RR 36-38. Pt is cool and diaphoretic with damp clammy skin, family at bedside and updated during report. Pt has no urine output but bleeding has slowed down around hematomas.

## 2022-05-14 NOTE — Progress Notes (Signed)
This nurse contacted patient's son Zi Newbury and updated on condition of patient.  Patient transferred to ICU bed 11 and report given to [place], [place].

## 2022-05-14 NOTE — Progress Notes (Addendum)
0240: Code Blue called; radial pulse 43 0241: Respiratory started respirations with ambu bag 0242: Zoll instructed to start CPR Chest compressions started 0245: Epi given 0246: chest compressions stopped and second epi given 0248: chest compressions restarted 0249:third epi given 0251: chest compressions stopped and Zoll pads changed 0252: fourth epi given 0253: analyzed heart rate 109 0255: move patient from bathroom floor to bed. Head board placed under patient. Chest compressions restarted and respiratory started artifical respirations with ambu bag again.  0256:fifth epi given. Chest compressions continue 0300: sixth epi given. CPR continues Attempting to intubate. Heart rate 83 at this time.  0303: intubated 7.5 0306: TNK 50mg  given. Blood sugar 343 0307: seventh epi given.  : bicarb given 0312: blood pressure 187/107 heart rate 116 0324: heart rate 97 0329: blood pressure 125/72 0336: triple lumen placed in left groin 0342: versed 2mg  given 0344: heart rate 140; staffed started transport to ICU

## 2022-05-14 NOTE — Progress Notes (Signed)
  X-cover Note: Code blue called to 3rd floor. On arrival pt was found in the bathroom lying on floor. CPR had not yet been started. Pt with agonal breathing. RN reported she felt a pulse. BMV started.  Soon thereafter, pt lost pulse. CPR started. AED pads placed. Due to patient positioning in bathroom, initially unable to get AED pads placed. After getting code cart closer to bathroom door and removing lots of equipment, AED pads placed. No shock was advised. I did not see the initial rhythm.  CPR started.  After about 3 mins, EDP arrived([place]).  ACLS protocol followed as outlined by Roque Lias, RN.  ROSC was obtained after about 30 mins into code.  During attempt to place right femoral CVL, the right femoral artery was punctured with arterial blood flow.  This confirmed ROSC.  Pressure held to right groin.  Pt intubated by EDP.  After multiple attempts in right groin, right IJ and left groin, I was able to place left femoral CVL with great difficulty.  See procedure note for CVL.  Pt transferred to ICU.   Carollee Herter, DO [facility]

## 2022-05-14 NOTE — Progress Notes (Addendum)
Pt seen and evaluated at bedside.  61 yo M post cardiac arrest early this AM from massive PE.  Got ROSC and TPA.  Now intubated, stacking breaths on vent, probably due to acidosis given most recent VBG showed pH 6.9.  Worried that this represents more of a lactic acidosis though as his BHB was only 0.8.  Hematomas in groin appear to be doing better per RN.  Hematoma in neck doing worse.  Getting ABG Check lactic acid Type and screen since HGB down to 8.8. Discussing with Dr. Warrick Parisian who recd putting in page to PCCM for formal consult, though pt at [facility] currently and much too unstable to attempt ambulance ride down to [facility].  Spoke with Dr. Vassie Loll: Agrees with above Okay to transfuse 1u since HGB drop and bleeding Called care link so they are aware of need eventually for transfer if we can stabilize him.  Dr. Vassie Loll accepted. Doesn't believe pH 6.95 on VBG -> get ABG If ABG pH less than 7.2, give 2 amps bicarb and start bicarb gtt. CRITICAL CARE Performed by: Hillary Bow.   Total critical care time: 70 minutes  Critical care time was exclusive of separately billable procedures and treating other patients.  Critical care was necessary to treat or prevent imminent or life-threatening deterioration.  Critical care was time spent personally by me on the following activities: development of treatment plan with patient and/or surrogate as well as nursing, discussions with consultants, evaluation of patient's response to treatment, examination of patient, obtaining history from patient or surrogate, ordering and performing treatments and interventions, ordering and review of laboratory studies, ordering and review of radiographic studies, pulse oximetry and re-evaluation of patient's condition.

## 2022-05-15 DIAGNOSIS — J4489 Other specified chronic obstructive pulmonary disease: Secondary | ICD-10-CM | POA: Diagnosis not present

## 2022-05-15 DIAGNOSIS — E1159 Type 2 diabetes mellitus with other circulatory complications: Secondary | ICD-10-CM | POA: Diagnosis not present

## 2022-05-15 DIAGNOSIS — I2609 Other pulmonary embolism with acute cor pulmonale: Secondary | ICD-10-CM

## 2022-05-15 DIAGNOSIS — M1A9XX0 Chronic gout, unspecified, without tophus (tophi): Secondary | ICD-10-CM

## 2022-05-15 DIAGNOSIS — K922 Gastrointestinal hemorrhage, unspecified: Secondary | ICD-10-CM | POA: Diagnosis not present

## 2022-05-15 DIAGNOSIS — E1110 Type 2 diabetes mellitus with ketoacidosis without coma: Secondary | ICD-10-CM

## 2022-05-15 DIAGNOSIS — G4733 Obstructive sleep apnea (adult) (pediatric): Secondary | ICD-10-CM | POA: Diagnosis not present

## 2022-05-15 DIAGNOSIS — I2694 Multiple subsegmental pulmonary emboli without acute cor pulmonale: Secondary | ICD-10-CM

## 2022-05-15 LAB — CBC
HCT: 24.1 % — ABNORMAL LOW (ref 39.0–52.0)
HCT: 25.5 % — ABNORMAL LOW (ref 39.0–52.0)
Hemoglobin: 8 g/dL — ABNORMAL LOW (ref 13.0–17.0)
Hemoglobin: 8.6 g/dL — ABNORMAL LOW (ref 13.0–17.0)
MCH: 32.1 pg (ref 26.0–34.0)
MCH: 32.8 pg (ref 26.0–34.0)
MCHC: 33.2 g/dL (ref 30.0–36.0)
MCHC: 33.7 g/dL (ref 30.0–36.0)
MCV: 96.8 fL (ref 80.0–100.0)
MCV: 97.3 fL (ref 80.0–100.0)
Platelets: 113 10*3/uL — ABNORMAL LOW (ref 150–400)
Platelets: 115 10*3/uL — ABNORMAL LOW (ref 150–400)
RBC: 2.49 MIL/uL — ABNORMAL LOW (ref 4.22–5.81)
RBC: 2.62 MIL/uL — ABNORMAL LOW (ref 4.22–5.81)
RDW: 15 % (ref 11.5–15.5)
RDW: 15.3 % (ref 11.5–15.5)
WBC: 21.4 10*3/uL — ABNORMAL HIGH (ref 4.0–10.5)
WBC: 22.9 10*3/uL — ABNORMAL HIGH (ref 4.0–10.5)
nRBC: 0.3 % — ABNORMAL HIGH (ref 0.0–0.2)
nRBC: 0.5 % — ABNORMAL HIGH (ref 0.0–0.2)

## 2022-05-15 LAB — GLUCOSE, CAPILLARY
Glucose-Capillary: 148 mg/dL — ABNORMAL HIGH (ref 70–99)
Glucose-Capillary: 149 mg/dL — ABNORMAL HIGH (ref 70–99)
Glucose-Capillary: 166 mg/dL — ABNORMAL HIGH (ref 70–99)
Glucose-Capillary: 168 mg/dL — ABNORMAL HIGH (ref 70–99)
Glucose-Capillary: 178 mg/dL — ABNORMAL HIGH (ref 70–99)
Glucose-Capillary: 181 mg/dL — ABNORMAL HIGH (ref 70–99)
Glucose-Capillary: 186 mg/dL — ABNORMAL HIGH (ref 70–99)
Glucose-Capillary: 199 mg/dL — ABNORMAL HIGH (ref 70–99)
Glucose-Capillary: 209 mg/dL — ABNORMAL HIGH (ref 70–99)
Glucose-Capillary: 243 mg/dL — ABNORMAL HIGH (ref 70–99)
Glucose-Capillary: 245 mg/dL — ABNORMAL HIGH (ref 70–99)
Glucose-Capillary: 248 mg/dL — ABNORMAL HIGH (ref 70–99)
Glucose-Capillary: 268 mg/dL — ABNORMAL HIGH (ref 70–99)
Glucose-Capillary: 268 mg/dL — ABNORMAL HIGH (ref 70–99)
Glucose-Capillary: 338 mg/dL — ABNORMAL HIGH (ref 70–99)
Glucose-Capillary: 344 mg/dL — ABNORMAL HIGH (ref 70–99)
Glucose-Capillary: 349 mg/dL — ABNORMAL HIGH (ref 70–99)
Glucose-Capillary: 371 mg/dL — ABNORMAL HIGH (ref 70–99)
Glucose-Capillary: 407 mg/dL — ABNORMAL HIGH (ref 70–99)

## 2022-05-15 LAB — BASIC METABOLIC PANEL
Anion gap: 17 — ABNORMAL HIGH (ref 5–15)
Anion gap: 15 (ref 5–15)
Anion gap: 14 (ref 5–15)
Anion gap: 14 (ref 5–15)
BUN: 33 mg/dL — ABNORMAL HIGH (ref 8–23)
BUN: 34 mg/dL — ABNORMAL HIGH (ref 8–23)
BUN: 41 mg/dL — ABNORMAL HIGH (ref 8–23)
BUN: 43 mg/dL — ABNORMAL HIGH (ref 8–23)
CO2: 18 mmol/L — ABNORMAL LOW (ref 22–32)
CO2: 19 mmol/L — ABNORMAL LOW (ref 22–32)
CO2: 22 mmol/L (ref 22–32)
CO2: 22 mmol/L (ref 22–32)
Calcium: 6.3 mg/dL — CL (ref 8.9–10.3)
Calcium: 6.4 mg/dL — CL (ref 8.9–10.3)
Calcium: 6.3 mg/dL — CL (ref 8.9–10.3)
Calcium: 6.1 mg/dL — CL (ref 8.9–10.3)
Chloride: 102 mmol/L (ref 98–111)
Chloride: 105 mmol/L (ref 98–111)
Chloride: 103 mmol/L (ref 98–111)
Chloride: 103 mmol/L (ref 98–111)
Creatinine, Ser: 4.09 mg/dL — ABNORMAL HIGH (ref 0.61–1.24)
Creatinine, Ser: 4.17 mg/dL — ABNORMAL HIGH (ref 0.61–1.24)
Creatinine, Ser: 4.72 mg/dL — ABNORMAL HIGH (ref 0.61–1.24)
Creatinine, Ser: 5.16 mg/dL — ABNORMAL HIGH (ref 0.61–1.24)
GFR, Estimated: 16 mL/min — ABNORMAL LOW (ref >60)
GFR, Estimated: 15 mL/min — ABNORMAL LOW (ref >60)
GFR, Estimated: 13 mL/min — ABNORMAL LOW (ref >60)
GFR, Estimated: 12 mL/min — ABNORMAL LOW (ref >60)
Glucose, Bld: 379 mg/dL — ABNORMAL HIGH (ref 70–99)
Glucose, Bld: 301 mg/dL — ABNORMAL HIGH (ref 70–99)
Glucose, Bld: 244 mg/dL — ABNORMAL HIGH (ref 70–99)
Glucose, Bld: 194 mg/dL — ABNORMAL HIGH (ref 70–99)
Potassium: 3.6 mmol/L (ref 3.5–5.1)
Potassium: 3.4 mmol/L — ABNORMAL LOW (ref 3.5–5.1)
Potassium: 3.7 mmol/L (ref 3.5–5.1)
Potassium: 3.9 mmol/L (ref 3.5–5.1)
Sodium: 137 mmol/L (ref 135–145)
Sodium: 139 mmol/L (ref 135–145)
Sodium: 139 mmol/L (ref 135–145)
Sodium: 139 mmol/L (ref 135–145)

## 2022-05-15 LAB — MAGNESIUM: Magnesium: 1.8 mg/dL (ref 1.7–2.4)

## 2022-05-15 LAB — LACTIC ACID, PLASMA: Lactic Acid, Venous: >9 mmol/L (ref 0.5–1.9)

## 2022-05-15 LAB — BETA-HYDROXYBUTYRIC ACID
Beta-Hydroxybutyric Acid: 0.47 mmol/L — ABNORMAL HIGH (ref 0.05–0.27)
Beta-Hydroxybutyric Acid: 0.27 mmol/L (ref 0.05–0.27)

## 2022-05-15 LAB — TRIGLYCERIDES: Triglycerides: 306 mg/dL — ABNORMAL HIGH (ref <150)

## 2022-05-15 LAB — PREPARE RBC (CROSSMATCH)

## 2022-05-15 LAB — PHOSPHORUS: Phosphorus: 5.2 mg/dL — ABNORMAL HIGH (ref 2.5–4.6)

## 2022-05-15 MED ORDER — INSULIN ASPART 100 UNIT/ML IJ SOLN
0.0000 [IU] | Freq: Three times a day (TID) | INTRAMUSCULAR | Status: DC
  Administered 2022-05-16 (×2): 5 [IU] via SUBCUTANEOUS
  Administered 2022-05-16: 7 [IU] via SUBCUTANEOUS

## 2022-05-15 MED ORDER — CALCIUM GLUCONATE-NACL 1-0.675 GM/50ML-% IV SOLN
1.0000 g | Freq: Once | INTRAVENOUS | Status: AC
  Administered 2022-05-15: 1000 mg via INTRAVENOUS
  Filled 2022-05-15: qty 50

## 2022-05-15 MED ORDER — SODIUM CHLORIDE 0.9% IV SOLUTION: Freq: Once | INTRAVENOUS | Status: DC

## 2022-05-15 MED ORDER — BUDESONIDE 0.5 MG/2ML IN SUSP
0.5000 mg | Freq: Two times a day (BID) | RESPIRATORY_TRACT | Status: DC
  Administered 2022-05-15 – 2022-06-14 (×58): 0.5 mg via RESPIRATORY_TRACT
  Filled 2022-05-15 (×60): qty 2

## 2022-05-15 MED ORDER — DOCUSATE SODIUM 50 MG/5ML PO LIQD
100.0000 mg | Freq: Two times a day (BID) | ORAL | Status: DC
  Administered 2022-05-15 – 2022-05-29 (×24): 100 mg
  Filled 2022-05-15 (×25): qty 10

## 2022-05-15 MED ORDER — CALCIUM GLUCONATE-NACL 2-0.675 GM/100ML-% IV SOLN: 2.0000 g | Freq: Once | INTRAVENOUS | Status: DC

## 2022-05-15 MED ORDER — DEXMEDETOMIDINE HCL IN NACL 400 MCG/100ML IV SOLN
0.0000 ug/kg/h | INTRAVENOUS | Status: DC
  Administered 2022-05-15: 0.4 ug/kg/h via INTRAVENOUS
  Administered 2022-05-15: 0.8 ug/kg/h via INTRAVENOUS
  Administered 2022-05-15: 0.6 ug/kg/h via INTRAVENOUS
  Administered 2022-05-16: 0.4 ug/kg/h via INTRAVENOUS
  Administered 2022-05-16 – 2022-05-17 (×2): 0.5 ug/kg/h via INTRAVENOUS
  Administered 2022-05-17 (×2): 0.6 ug/kg/h via INTRAVENOUS
  Administered 2022-05-17 (×3): 0.5 ug/kg/h via INTRAVENOUS
  Administered 2022-05-18: 0.7 ug/kg/h via INTRAVENOUS
  Administered 2022-05-18: 0.6 ug/kg/h via INTRAVENOUS
  Administered 2022-05-18 (×2): 0.7 ug/kg/h via INTRAVENOUS
  Administered 2022-05-18 (×2): 0.6 ug/kg/h via INTRAVENOUS
  Administered 2022-05-19 (×2): 1 ug/kg/h via INTRAVENOUS
  Administered 2022-05-19: 1.2 ug/kg/h via INTRAVENOUS
  Administered 2022-05-19 (×3): 1 ug/kg/h via INTRAVENOUS
  Administered 2022-05-19: 1.5 ug/kg/h via INTRAVENOUS
  Administered 2022-05-19 (×3): 1.2 ug/kg/h via INTRAVENOUS
  Administered 2022-05-19: 1 ug/kg/h via INTRAVENOUS
  Administered 2022-05-20 (×5): 1.5 ug/kg/h via INTRAVENOUS
  Filled 2022-05-15 (×9): qty 100
  Filled 2022-05-15: qty 200
  Filled 2022-05-15 (×27): qty 100

## 2022-05-15 MED ORDER — CALCIUM GLUCONATE-NACL 1-0.675 GM/50ML-% IV SOLN: 1.0000 g | Freq: Once | INTRAVENOUS | Status: DC

## 2022-05-15 MED ORDER — INSULIN DETEMIR 100 UNIT/ML ~~LOC~~ SOLN
12.0000 [IU] | Freq: Two times a day (BID) | SUBCUTANEOUS | Status: DC
  Administered 2022-05-15 – 2022-05-17 (×5): 12 [IU] via SUBCUTANEOUS
  Filled 2022-05-15 (×7): qty 0.12

## 2022-05-15 MED ORDER — PANTOPRAZOLE SODIUM 40 MG IV SOLR
40.0000 mg | Freq: Two times a day (BID) | INTRAVENOUS | Status: DC
  Administered 2022-05-15 – 2022-05-28 (×28): 40 mg via INTRAVENOUS
  Filled 2022-05-15 (×29): qty 10

## 2022-05-15 MED ORDER — POTASSIUM CHLORIDE 10 MEQ/100ML IV SOLN
10.0000 meq | INTRAVENOUS | Status: AC
  Administered 2022-05-15 (×3): 10 meq via INTRAVENOUS
  Filled 2022-05-15 (×2): qty 100

## 2022-05-15 MED ORDER — POLYETHYLENE GLYCOL 3350 17 G PO PACK
17.0000 g | PACK | Freq: Every day | ORAL | Status: DC
  Administered 2022-05-15 – 2022-05-20 (×6): 17 g
  Filled 2022-05-15 (×6): qty 1

## 2022-05-15 MED ORDER — CALCIUM GLUCONATE-NACL 2-0.675 GM/100ML-% IV SOLN
2.0000 g | Freq: Once | INTRAVENOUS | Status: DC
  Filled 2022-05-15: qty 100

## 2022-05-15 NOTE — Consult Note (Signed)
Referring Provider: No ref. provider found Primary Care Physician:  Vanessa Ethan, FNP   Reason for Consultation: Coffee-ground emesis.  HPI: 61 year old morbidly obese gentleman with a history of PE/DVT diabetes hypertension gout COPD obstructive sleep apnea presented to the ED 12/28 with progressive dyspnea chest pain..  Missed multiple doses of Xarelto around the Christmas holiday. The evaluation included a CT which showed multiple, pulmonary emboli.  He was admitted and started on a heparin drip.  Hospital course complicated by cardiopulmonary arrest yesterday.  He was resuscitated.  Given thrombolytic therapy. Around the time of intubation and OG tube placement it was noted that he had about 400 cc of coffee-ground emesis out of his mouth and through the OG tube.  No out now hematemesis.  No reported melena or hematochezia according to nursing staff. Hemoglobin on admission 13.4 on 12/29;  8.0 this morning; being transfused 1 unit This a.m..  Of note Dopplers of the lower extremities demonstrated extensive left lower extremity DVT. History unobtainable from patient.  Past medical history significant for a heller myotomy about 10 years ago.  No documented history of peptic ulcer ; multiple EGDs in the past revealed changes consistent with achalasia and a goiter.  Past Medical History:  Diagnosis Date   Arthritis    L shoulder & fingers    Bronchitis    during enviromental changes    Complication of anesthesia    Report of airway problem   Diabetes mellitus without complication (HCC)    Dyspnea    Embolism - blood clot    Gout    Headache    uses ibuprofen & tylenol & its relieved    Hypertension    Hypothyroidism    Myocardial infarction (HCC) 2012   OSA on CPAP    Peripheral vascular disease (HCC)    Pulmonary embolism (HCC)    Stroke (HCC) 2014    Past Surgical History:  Procedure Laterality Date   HELLER MYOTOMY  2014   [place]   IVC FILTER INSERTION   2010   ROTATOR CUFF REPAIR Right    SKIN GRAFT     x3- to face., child    THYROIDECTOMY N/A 03/09/2017   Procedure: TOTAL THYROIDECTOMY;  Surgeon: Darnell Level, MD;  Location: [facility] OR;  Service: General;  Laterality: N/A;   TOTAL THYROIDECTOMY  03/09/2017    Prior to Admission medications   Medication Sig Start Date End Date Taking? Authorizing Provider  acetaminophen (TYLENOL) 325 MG tablet Take 650 mg by mouth every 6 (six) hours as needed for moderate pain or headache.   Yes [provider]  albuterol (VENTOLIN HFA) 108 (90 Base) MCG/ACT inhaler Inhale 2 puffs into the lungs every 4 (four) hours as needed for wheezing or shortness of breath. 05/18/20  Yes Shah, Pratik D, DO  allopurinol (ZYLOPRIM) 300 MG tablet Take 300 mg by mouth daily.   Yes [provider]  aspirin EC 81 MG tablet Take 81 mg by mouth daily. Swallow whole.   Yes [provider]  atorvastatin (LIPITOR) 40 MG tablet Take 40 mg by mouth daily.   Yes [provider]  budesonide-formoterol (SYMBICORT) 160-4.5 MCG/ACT inhaler Inhale 2 puffs into the lungs 2 (two) times daily as needed (for shortness of breath).    Yes [provider]  Cholecalciferol (VITAMIN D3) 5000 units CAPS Take 5,000 Units by mouth 2 (two) times daily.   Yes [provider]  colchicine 0.6 MG tablet Take 0.6 mg by  mouth daily as needed (for gout flare/pain).    Yes [provider]  Cyanocobalamin (B-12) 5000 MCG CAPS Take 5,000 mcg by mouth daily.    Yes [provider]  furosemide (LASIX) 20 MG tablet Take 20 mg by mouth daily as needed.    Yes [provider]  levothyroxine (SYNTHROID, LEVOTHROID) 200 MCG tablet TAKE 1 TABLET EVERY DAY Patient taking differently: Take 200 mcg by mouth daily before breakfast. 09/28/17  Yes Nida, Denman George, MD  Multiple Vitamin (MULTIVITAMIN WITH MINERALS) TABS tablet Take 1 tablet by mouth daily.   Yes [provider]  vitamin C  (ASCORBIC ACID) 500 MG tablet Take 500 mg by mouth daily.   Yes [provider]  XARELTO 20 MG TABS tablet TAKE 1 TABLET EVERY DAY Patient taking differently: Take 20 mg by mouth daily with supper. 10/10/18  Yes Laqueta Linden, MD  calcium carbonate (TUMS) 500 MG chewable tablet Chew 2 tablets (400 mg of elemental calcium total) by mouth 4 (four) times daily. Patient not taking: Reported on 05/12/2022 03/11/17   Darnell Level, MD  calcium-vitamin D (OSCAL 500/200 D-3) 500-200 MG-UNIT tablet Take 1 tablet by mouth 2 (two) times daily. Patient not taking: Reported on 05/12/2022 02/02/17   [name], MD  isosorbide mononitrate (IMDUR) 30 MG 24 hr tablet Take 30 mg by mouth every day Patient not taking: Reported on 05/12/2022 12/07/17   Laqueta Linden, MD  methocarbamol (ROBAXIN) 500 MG tablet Take 1 tablet (500 mg total) by mouth 2 (two) times daily as needed for muscle spasms. Patient not taking: Reported on 05/12/2022 11/08/21   Eber Hong, MD    Current Facility-Administered Medications  Medication Dose Route Frequency Provider Last Rate Last Admin   0.9 %  sodium chloride infusion (Manually program via Guardrails IV Fluids)   Intravenous Once Julian Reil, Jared M, DO       0.9 %  sodium chloride infusion   Intravenous Continuous Sherryll Burger, Pratik D, DO   Stopped at 05/14/22 2224   acetaminophen (TYLENOL) tablet 650 mg  650 mg Oral Q6H PRN Emokpae, Ejiroghene E, MD       Or   acetaminophen (TYLENOL) suppository 650 mg  650 mg Rectal Q6H PRN Emokpae, Ejiroghene E, MD   650 mg at 05/14/22 1808   albuterol (PROVENTIL) (2.5 MG/3ML) 0.083% nebulizer solution 2.5 mg  2.5 mg Nebulization Q4H PRN Emokpae, Ejiroghene E, MD       allopurinol (ZYLOPRIM) tablet 300 mg  300 mg Oral Daily Emokpae, Ejiroghene E, MD   300 mg at 05/13/22 1011   Chlorhexidine Gluconate Cloth 2 % PADS 6 each  6 each Topical Daily Sherryll Burger, Pratik D, DO   6 each at 05/14/22 0500   dextrose 5 % in lactated ringers  infusion   Intravenous Continuous Hillary Bow, DO 30 mL/hr at 05/15/22 0837 Infusion Verify at 05/15/22 0837   dextrose 50 % solution 0-50 mL  0-50 mL Intravenous PRN Sherryll Burger, Pratik D, DO       EPINEPHrine (ADRENALIN) 5 mg in NS 250 mL (0.02 mg/mL) premix infusion  0.5-20 mcg/min Intravenous Titrated Maurilio Lovely D, DO 60 mL/hr at 05/15/22 0837 20 mcg/min at 05/15/22 0837   fentaNYL in NS (10mcg/ml) infusion-PREMIX  0-400 mcg/hr Intravenous Continuous Migdalia Dk, MD 20 mL/hr at 05/15/22 0837 200 mcg/hr at 05/15/22 0837   hydrocortisone sodium succinate (SOLU-CORTEF) 100 MG injection 100 mg  100 mg Intravenous Q8H Shah, Pratik D, DO  100 mg at 05/15/22 0840   insulin regular, human (MYXREDLIN) 100 units/ 100 mL infusion   Intravenous Continuous Maurilio Lovely D, DO 14 mL/hr at 05/15/22 1610 14 Units/hr at 05/15/22 9604   lactated ringers infusion   Intravenous Continuous Hillary Bow, DO   Stopped at 05/15/22 5409   levothyroxine (SYNTHROID) tablet 200 mcg  200 mcg Oral QAC breakfast Emokpae, Ejiroghene E, MD   200 mcg at 05/13/22 0558   midazolam (VERSED) injection 1-2 mg  1-2 mg Intravenous Q1H PRN Maurilio Lovely D, DO   2 mg at 05/14/22 1918   mometasone-formoterol (DULERA) 200-5 MCG/ACT inhaler 2 puff  2 puff Inhalation BID PRN Sherryll Burger, Pratik D, DO       norepinephrine (LEVOPHED) 16 mg in (0.064 mg/mL) premix infusion  0-60 mcg/min Intravenous Titrated Hillary Bow, DO 55.3 mL/hr at 05/15/22 0837 59 mcg/min at 05/15/22 0837   pantoprazole (PROTONIX) injection 40 mg  40 mg Intravenous Q12H Lyda Perone M, DO       potassium chloride 10 mEq in 100 mL IVPB  10 mEq Intravenous Q1 Hr x 3 Lyda Perone M, DO 100 mL/hr at 05/15/22 0840 10 mEq at 05/15/22 0840   propofol (DIPRIVAN) 1000 MG/100ML infusion  5-80 mcg/kg/min Intravenous Titrated Migdalia Dk, MD   Stopped at 05/15/22 8119   sodium bicarbonate 150 mEq in sterile water 1,150 mL infusion   Intravenous  Continuous Migdalia Dk, MD 100 mL/hr at 05/15/22 1478 Infusion Verify at 05/15/22 2956   vasopressin (PITRESSIN) 20 Units in sodium chloride 0.9 % 100 mL infusion-*FOR SHOCK*  0-0.04 Units/min Intravenous Continuous Carollee Herter, DO 12 mL/hr at 05/15/22 0837 0.04 Units/min at 05/15/22 0837    Allergies as of 05/12/2022 - Review Complete 05/12/2022  Allergen Reaction Noted   Nabumetone Hives     Family History  Problem Relation Age of Onset   Heart attack Sister 75       Two stents   Heart attack Mother    Heart attack Maternal Grandmother    Heart attack Maternal Grandfather     Social History   Socioeconomic History   Marital status: Divorced    Spouse name: Not on file   Number of children: Not on file   Years of education: Not on file   Highest education level: Not on file  Occupational History   Not on file  Tobacco Use   Smoking status: Former    Packs/day: 1.00    Years: 30.00    Total pack years: 30.00    Types: Cigarettes    Start date: 01/30/1970    Quit date: 01/30/2006    Years since quitting: 16.2   Smokeless tobacco: Former   Tobacco comments:    9/17 On and off since age 61  Vaping Use   Vaping Use: Never used  Substance and Sexual Activity   Alcohol use: Not Currently    Comment: none in over 1 year   Drug use: No   Sexual activity: Yes    Birth control/protection: None  Other Topics Concern   Not on file  Social History Narrative   Not on file   Social Determinants of Health   Financial Resource Strain: Not on file  Food Insecurity: No Food Insecurity (05/12/2022)   Hunger Vital Sign    Worried About Running Out of Food in the Last Year: Never true    Ran Out of Food in the Last Year: Never true  Transportation  Needs: No Transportation Needs (05/12/2022)   PRAPARE - Administrator, Civil Service (Medical): No    Lack of Transportation (Non-Medical): No  Physical Activity: Not on file  Stress: Not on file  Social  Connections: Not on file  Intimate Partner Violence: Not At Risk (05/12/2022)   Humiliation, Afraid, Rape, and Kick questionnaire    Fear of Current or Ex-Partner: No    Emotionally Abused: No    Physically Abused: No    Sexually Abused: No    Review of Systems:  Unobtainable  Physical Exam: Vital signs in last 24 hours: Temp:  [98.3 F (36.8 C)-100.6 F (38.1 C)] 99.3 F (37.4 C) (12/31 0738) Pulse Rate:  [96-116] 104 (12/31 0740) Resp:  [13-35] 26 (12/31 0740) BP: (53-180)/(32-122) 131/48 (12/31 0730) SpO2:  [0 %-100 %] 98 % (12/31 0740) FiO2 (%):  [60 %-100 %] 60 % (12/31 0600) Last BM Date : 05/12/22 General:   Patient intubated and unresponsive.  OG in place. Mouth: Hematoma involving the right side of his tongue present.   Neck:  Supple; no masses or thyromegaly. Abdomen:  Soft, nontender and nondistended. No masses, hepatosplenomegaly or hernias noted. Normal bowel sounds, without guarding, and without rebound.    Intake/Output from previous day: 12/30 0701 - 12/31 0700 In: 8932.4 [I.V.:5567.6; Blood:564; IV Piggyback:2800.8] Out: 455 [Urine:55; Emesis/NG output:400] Intake/Output this shift: Total I/O In: 882.7 [I.V.:497.7; Blood:385] Out: -   Lab Results: Recent Labs    05/14/22 1959 05/15/22 0043 05/15/22 0353  WBC 25.7* 22.9* 21.4*  HGB 8.8* 8.6* 8.0*  HCT 26.9* 25.5* 24.1*  PLT 135* 115* 113*   BMET Recent Labs    05/15/22 0043 05/15/22 0353 05/15/22 0812  NA 137 139 139  K 3.6 3.4* 3.7  CL 102 105 103  CO2 18* 19* 22  GLUCOSE 379* 301* 244*  BUN 33* 34* 41*  CREATININE 4.09* 4.17* 4.72*  CALCIUM 6.3* 6.4* 6.3*   LFT No results for input(s): "PROT", "ALBUMIN", "AST", "ALT", "ALKPHOS", "BILITOT", "BILIDIR", "IBILI" in the last 72 hours. PT/INR No results for input(s): "LABPROT", "INR" in the last 72 hours. Hepatitis Panel No results for input(s): "HEPBSAG", "HCVAB", "HEPAIGM", "HEPBIGM" in the last 72 hours. C-Diff No results for  input(s): "CDIFFTOX" in the last 72 hours.  Studies/Results: DG CHEST PORT 1 VIEW  Result Date: 05/14/2022 CLINICAL DATA:  61 year old male status post intubation and central venous catheter placement. EXAM: PORTABLE CHEST 1 VIEW COMPARISON:  Chest x-ray 05/12/2022. FINDINGS: An endotracheal tube is in place with tip 3.7 cm above the carina. No definite central venous catheter identified. Transcutaneous defibrillator pad projecting over the lower left hemithorax. Mild elevation of the right hemidiaphragm. New opacity in the right mid to lower lung partially obscuring the right heart border. Opacity in the medial aspect of the right upper lobe also noted. Left lung appears clear. No pneumothorax. No evidence of pulmonary edema. Heart size is normal. Upper mediastinal contours are within normal limits. IMPRESSION: 1. Support apparatus, as above. 2. Multifocal airspace consolidation in the right lung, concerning for developing right upper and right middle lobe bronchopneumonia. Electronically Signed   By: Trudie Reed M.D.   On: 05/14/2022 06:03   ECHOCARDIOGRAM COMPLETE  Result Date: 05/13/2022    ECHOCARDIOGRAM REPORT   Patient Name:   Frank Shelton. Date of Exam: 05/13/2022 Medical Rec #:  [id number]          Height:       76.0 in Accession #:  [id number]         Weight:       374.5 lb Date of Birth:  02-09-1961          BSA:          2.892 m Patient Age:    61 years           BP:           101/60 mmHg Patient Gender: M                  HR:           68 bpm. [facility] Procedure: 2D Echo, Cardiac Doppler and Color Doppler Indications:    Pulmonary Embolus I26.09  History:        Patient has prior history of Echocardiogram examinations, most                 recent 05/17/2020. Previous Myocardial Infarction, Stroke and                 COPD; Risk Factors:Hypertension, Diabetes, Former Smoker and                 Sleep Apnea.  Sonographer:    Celesta Gentile RCS Referring Phys: [phone]  Heloise Beecham EMOKPAE IMPRESSIONS  1. Left ventricular ejection fraction, by estimation, is 60 to 65%. The left ventricle has normal function. The left ventricle has no regional wall motion abnormalities. There is moderate left ventricular hypertrophy. Left ventricular diastolic parameters were normal.  2. Right ventricular systolic function is normal. The right ventricular size is normal.  3. The mitral valve is abnormal. No evidence of mitral valve regurgitation. No evidence of mitral stenosis.  4. The aortic valve is tricuspid. Aortic valve regurgitation is not visualized. No aortic stenosis is present.  5. Aortic dilatation noted. There is mild dilatation of the aortic root, measuring 40 mm.  6. The inferior vena cava is normal in size with greater than 50% respiratory variability, suggesting right atrial pressure of 3 mmHg. FINDINGS  Left Ventricle: Left ventricular ejection fraction, by estimation, is 60 to 65%. The left ventricle has normal function. The left ventricle has no regional wall motion abnormalities. Definity contrast agent was given IV to delineate the left ventricular  endocardial borders. The left ventricular internal cavity size was normal in size. There is moderate left ventricular hypertrophy. Left ventricular diastolic parameters were normal. Right Ventricle: The right ventricular size is normal. No increase in right ventricular wall thickness. Right ventricular systolic function is normal. Left Atrium: Left atrial size was normal in size. Right Atrium: Right atrial size was normal in size. Pericardium: There is no evidence of pericardial effusion. Mitral Valve: The mitral valve is abnormal. There is mild thickening of the mitral valve leaflet(s). There is mild calcification of the mitral valve leaflet(s). No evidence of mitral valve regurgitation. No evidence of mitral valve stenosis. Tricuspid Valve: The tricuspid valve is normal in structure. Tricuspid valve regurgitation is not  demonstrated. No evidence of tricuspid stenosis. Aortic Valve: The aortic valve is tricuspid. Aortic valve regurgitation is not visualized. No aortic stenosis is present. Pulmonic Valve: The pulmonic valve was normal in structure. Pulmonic valve regurgitation is not visualized. No evidence of pulmonic stenosis. Aorta: Aortic dilatation noted. There is mild dilatation of the aortic root, measuring 40 mm. Venous: The inferior vena cava is normal in size with greater than 50% respiratory variability, suggesting right atrial pressure of 3 mmHg. IAS/Shunts: The interatrial  septum was not well visualized.  LEFT VENTRICLE PLAX 2D LVIDd:         4.10 cm   Diastology LVIDs:         2.60 cm   LV e' medial:    6.85 cm/s LV PW:         1.30 cm   LV E/e' medial:  8.8 LV IVS:        1.40 cm   LV e' lateral:   7.83 cm/s LVOT diam:     2.30 cm   LV E/e' lateral: 7.7 LV SV:         74 LV SV Index:   26 LVOT Area:     4.15 cm  RIGHT VENTRICLE RV S prime:     13.10 cm/s TAPSE (M-mode): 2.2 cm LEFT ATRIUM              Index        RIGHT ATRIUM           Index LA diam:        3.00 cm  1.04 cm/m   RA Area:     17.90 cm LA Vol (A2C):   85.3 ml  29.50 ml/m  RA Volume:   43.90 ml  15.18 ml/m LA Vol (A4C):   102.0 ml 35.27 ml/m LA Biplane Vol: 95.1 ml  32.88 ml/m  AORTIC VALVE LVOT Vmax:   80.70 cm/s LVOT Vmean:  55.500 cm/s LVOT VTI:    0.179 m  AORTA Ao Root diam: 4.00 cm MITRAL VALVE MV Area (PHT): 2.69 cm    SHUNTS MV Decel Time: 282 msec    Systemic VTI:  0.18 m MV E velocity: 60.30 cm/s  Systemic Diam: 2.30 cm MV A velocity: 52.00 cm/s MV E/A ratio:  1.16 Charlton Haws MD Electronically signed by Charlton Haws MD Signature Date/Time: 05/13/2022/3:46:04 PM    Final    US Venous Img Lower Bilateral (DVT)  Result Date: 05/13/2022 CLINICAL DATA:  Pulmonary embolism. Prior history of DVT in the 1980s. Assess for residual DVT EXAM: BILATERAL LOWER EXTREMITY VENOUS DOPPLER ULTRASOUND TECHNIQUE: Gray-scale sonography with graded  compression, as well as color Doppler and duplex ultrasound were performed to evaluate the lower extremity deep venous systems from the level of the common femoral vein and including the common femoral, femoral, profunda femoral, popliteal and calf veins including the posterior tibial, peroneal and gastrocnemius veins when visible. The superficial great saphenous vein was also interrogated. Spectral Doppler was utilized to evaluate flow at rest and with distal augmentation maneuvers in the common femoral, femoral and popliteal veins. COMPARISON:  None Available. FINDINGS: RIGHT LOWER EXTREMITY Common Femoral Vein: No evidence of thrombus. Normal compressibility, respiratory phasicity and response to augmentation. Saphenofemoral Junction: No evidence of thrombus. Normal compressibility and flow on color Doppler imaging. Profunda Femoral Vein: No evidence of thrombus. Normal compressibility and flow on color Doppler imaging. Femoral Vein: No evidence of thrombus. Normal compressibility, respiratory phasicity and response to augmentation. Popliteal Vein: No evidence of thrombus. Normal compressibility, respiratory phasicity and response to augmentation. Calf Veins: No evidence of thrombus. Normal compressibility and flow on color Doppler imaging. Superficial Great Saphenous Vein: Thrombus is present within the great saphenous vein. Thrombus is nonocclusive and likely chronic in nature. Venous Reflux:  None. Other Findings:  None. LEFT LOWER EXTREMITY Common Femoral Vein: Abnormal. The vessel is noncompressible, expanded and filled with low-level internal echoes. No evidence of flow on color Doppler imaging. Findings are consistent with acute occlusive  DVT. Saphenofemoral Junction: Nonocclusive DVT extends into the saphenofemoral junction. Profunda Femoral Vein: Occlusive DVT extends into the profunda femoral vein. Femoral Vein: Occlusive thrombus extends throughout the femoral vein in the left leg. Popliteal Vein:  Occlusive thrombus extends through the popliteal vein. Calf Veins: Occlusive thrombus extends into the posterior tibial veins. Superficial Great Saphenous Vein: No evidence of thrombus. Normal compressibility. Venous Reflux:  None. Other Findings:  None. IMPRESSION: 1. Positive for extensive acute occlusive left lower extremity DVT beginning at the common femoral vein and extending throughout the leg into the calf. 2. Positive for chronic appearing nonocclusive superficial thrombus within the right great saphenous vein. 3. No evidence of deep venous thrombosis in the right lower extremity. Electronically Signed   By: Malachy Moan M.D.   On: 05/13/2022 10:47    Impression:  Gravely ill 61 year old gentleman admitted with extensive pulmonary emboli complicated by PEA cardiac arrest with recent 3-day interruption in his anticoagulation therapy.  This gentleman remains in shock requiring maximal pressor dosing..  Significant drop in his hemoglobin in the setting of 400 cc of coffee-ground emesis/OG return around the time of arrest, intubation and OG tube placement.  According to nursing staff, he was biting his tongue at the time of central line placement.  He has not had any gross hematemesis and has not had any stool output.  He has received 1 unit of packed RBCs.  He received tPA.  It appears he did suffer some bleeding from at least his oropharyngeal area.  He has a notable tongue hematoma.  Mallory-Weiss tear/reflux esophagitis remain possibilities as does peptic ulcer disease.  Hemoptysis seems less likely.  Would have expected more gross blood in the endotracheal tube if that was an issue.  History of Heller myotomy presumably due to achalasia over 10 years ago.  At this time, he is not an endoscopy candidate here due to his instability and comorbidities  Recommendations:  Monitoring for signs of ongoing blood loss  Follow hemoglobin and hematocrit  Twice daily PPI should suffice for  prophylaxis.  Tertiary referral as feasible.  Prognosis quite guarded at this time.    Notice:  This dictation was prepared with Dragon dictation along with smaller phrase technology. Any transcriptional errors that result from this process are unintentional and may not be corrected upon review.

## 2022-05-15 NOTE — Progress Notes (Addendum)
Pt BP very labile this shift, pt is very sensitive to propofol and levo titrations, attempting to use just fentanyl for sedation, was able to come off propofol for a short while but pt starts reaching for the ETT and attempts to sit up in bed, pt also attempts to mouth words. Discussed all with Dr. Warrick Parisian and Dr. Julian Reil will continue to wean and titrate as appropriate. Pt has received 2 units of PRBC's (one infusing now) without issue or s/s reaction. OGT was placed with brown/coffee ground emesis immediately. Pt did have one episode of emesis prior to placement of OGT, no new bleeding or oral trauma occurred after placement. Pt remains very critical with multiple gtt titrations.  Pt has had a max urine out put of 32ml tea in color.

## 2022-05-15 NOTE — Progress Notes (Signed)
Late entry. This nurse was primary nurse for patient yesterday with Marcelino Duster, orientee. At approximately 0215 on 05/14/22 this nurse was performing safety rounds during this time this nurse observed patient sitting on side of bed with walker in front of him. When asked was he okay patient stated that he had to go to the bathroom. This nurse spoke with patient about safety and encouraged patient to remain in the bed. Patient refused bed pan. BSC was offered, patient stated, "Im going in that damn bathroom". This nurse called for assistance as patient was actively getting out of bed. This nurse and Marcelino Duster, orientee assisted patient into the bathroom to prevent fall. Patient ambulated to bathroom and placed on toilet. Patient became short of breath and quickly unresponsive. Code blue intiiated.

## 2022-05-15 NOTE — Progress Notes (Signed)
Critical lab value obtained, Calcium @ 6.3. MD notified

## 2022-05-15 NOTE — Progress Notes (Addendum)
Replacing calcium, 2gm ordered Check phos. HGB slightly down to 8.6 despite transfusion. Will check repeat CBC at 0500 and decide on further transfusion need based on that. Lactate >9 still Was given 2 amps of bicarb and started on bicarb gtt at [facility] instructions earlier.

## 2022-05-15 NOTE — Progress Notes (Signed)
OGT in place, 400cc of coffee ground output.  Ordering Q12 protonix Consult to GI for AM put into Epic

## 2022-05-15 NOTE — Progress Notes (Addendum)
PROGRESS NOTE    Frank Shelton.  INO:[id number] DOB: Apr 20, 1961 DOA: 05/12/2022 PCP: Vanessa [place], FNP   Brief Narrative:    Frank Shelton. is a 61 y.o. male with medical history significant for  PE and DVT, DM, HTN, Gout, COPD, OSA.  Patient presented to the ED with complaints of difficulty breathing, chest pain started about 1 AM this morning.  Patient was admitted with pulmonary embolism and is now also noted to have left lower extremity DVT that developed after missing 3 doses of Xarelto at home.  He unfortunately had a cardiac arrest overnight on 12/29 with ROSC and is now intubated on ventilator and started on 3 pressors with ongoing soft blood pressure readings and inability to transfer.  Poor short-term prognosis noted with anticipated in-hospital death should he not wean off the pressors. Attempting to wean at this time with discontinuation of Propofol. Noted to have severe anion gap acidosis and patient now in DKA.  Assessment & Plan:   Principal Problem:   Pulmonary embolism (HCC) Active Problems:   GOUT   OBSTRUCTIVE SLEEP APNEA   Essential hypertension   Other specified chronic obstructive pulmonary disease   Hypercoagulopathy (HCC)   DM type 2 causing vascular disease (HCC)   Upper GI bleed  Assessment and Plan:   Refractory circulatory shock status post PEA arrest secondary to pulmonary embolism (HCC) with DVT History of PE and hypercoagulable state, last dose of Xarelto 12/24, missed 3 days of Xarelto before onset of symptoms.  Presenting with chest pain, difficulty breathing, dizziness.  CTA chest shows multiple PE, possible chronic right heart failure.  Has been on Xarelto for well over 10 years.  Reports prior to xarelto he was on warfarin- since 1986 but his blood was never thin enough.  He has bilateral lower extremity edema secondary to venous hypertension-saw Dr. Tawanna Cooler 2018. -Patient was on full dose Lovenox but then underwent cardiac arrest on  12/29 -Now on 3 pressors with shock that is not improving -Started on hydrocortisone -2D echocardiogram 12/29 did not demonstrate any RV strain and vital signs have remained stable until the point of cardiac arrest -Alteplase administered and now with large hematoma near central venous line placement to right groin -Continue to wean pressors as tolerated and if this is possible, may consider transfer to [facility] service for further management as long as patient has intact neurological function.  At this time, during sedation holiday patient demonstrated ability to track and to open his eyes spontaneously.  Patient then became agitated and restless requiring sedation once again to be provided. -overall poor prognosis   DM type 2 causing vascular disease (HCC) now with DKA -Improved/resolved with fluid resuscitation and insulin drip -Following Endo tool will transition to long-acting and sliding scale insulin -A1c 11.4 demonstrating poor control.    Acute anemia -Downtrending after administration of tenecteplase -Continue to hold on Lovenox for now -Monitor CBC and further transfuse as needed.   Other specified chronic obstructive pulmonary disease Quit smoking cigarettes 16 years ago. -Continue bronchodilator management and nebulizer.   Essential hypertension currently in shock -Requiring multiple pressors to maintain adequate MAP -Holding home oral antihypertensive agents.   OBSTRUCTIVE SLEEP APNEA -Currently intubated   GOUT -Resume allopurinol when able to tolerate p.o.'s. -No signs of acute flare currently.   Morbid obesity BMI 45.59 -Patient will benefit of low calorie diet, portion control and increase physical activity if able to overcome this hospitalization.    DVT prophylaxis:Full dose lovenox,  will discontinue due to hemoptysis and worsening anemia Code Status: DNR Family Communication: Discussed with sons at bedside 12/31 Disposition Plan:  Status is:  Inpatient Remains inpatient appropriate because: IV medications/vent support.  CRITICAL CARE Performed by: Vassie Loll   Total critical care time: 60 minutes  Critical care time was exclusive of separately billable procedures and treating other patients.  Critical care was necessary to treat or prevent imminent or life-threatening deterioration.  Critical care was time spent personally by me on the following activities: development of treatment plan with patient and/or surrogate as well as nursing, discussions with consultants, evaluation of patient's response to treatment, examination of patient, obtaining history from patient or surrogate, ordering and performing treatments and interventions, ordering and review of laboratory studies, ordering and review of radiographic studies, pulse oximetry and re-evaluation of patient's condition.   Consultants:  Discussed with hematology Dr. Kirtland Bouchard 12/29 PCCM called overnight 12/29   Procedures:  Intubation 12/30 Central venous line placement 12/30   Antimicrobials:  None    Subjective: Sedated, intubated and mechanically ventilated; overnight with concerns of blood retrieved through OG tube; on examination there are signs of tongue biting with concerns of bleeding coming from that.  No chest pain, no nausea, no vomiting.  Continues to require multiple pressors.  Objective: Vitals:   05/15/22 0900 05/15/22 0915 05/15/22 1000 05/15/22 1109  BP: (!) 139/57  (!) 138/53   Pulse: 100  (!) 105 (!) 107  Resp: (!) 0  (!) 32 (!) 32  Temp:    (!) 100.5 F (38.1 C)  TempSrc:    Axillary  SpO2: 97% 97% 97% 97%  Weight:      Height:        Intake/Output Summary (Last 24 hours) at 05/15/2022 1353 Last data filed at 05/15/2022 1318 Gross per 24 hour  Intake 9730.82 ml  Output 455 ml  Net 9275.82 ml   Filed Weights   05/12/22 1139 05/12/22 2351  Weight: (!) 149.7 kg (!) 169.9 kg    Examination: General exam: Sedated, intubated and  mechanically ventilated; guarding sedation hold 11/wake-up assessment was able to open his eyes and track inside the room.  Patient later became agitated and restless.  Tongue hematoma appreciated Respiratory system: Decreased breath sounds at the bases; positive scattered rhonchi, no wheezing on exam. Cardiovascular system: Sinus tachycardia, no rubs, no gallops, unable to assess JVD with body habitus. Gastrointestinal system: Abdomen is obese, nondistended, soft and nontender.  Positive bowel sounds appreciated. Central nervous system: Limited examination secondary to ongoing sedation. Extremities: No cyanosis or clubbing; 1-2+ edema appreciated bilaterally, chronic stasis dermatitis changes seen. Skin: No petechiae. Psychiatry: Limited examination due to sedation.  Data Reviewed: I have personally reviewed following labs and imaging studies  CBC: Recent Labs  Lab 05/14/22 0434 05/14/22 1450 05/14/22 1959 05/15/22 0043 05/15/22 0353  WBC 18.1* 27.0* 25.7* 22.9* 21.4*  HGB 12.0* 9.9* 8.8* 8.6* 8.0*  HCT 37.6* 29.9* 26.9* 25.5* 24.1*  MCV 103.6* 102.7* 103.9* 97.3 96.8  PLT 104* 141* 135* 115* 113*   Basic Metabolic Panel: Recent Labs  Lab 05/14/22 0434 05/14/22 1450 05/14/22 1956 05/15/22 0043 05/15/22 0353 05/15/22 0812  NA 133* 134* 135 137 139 139  K 3.9 4.6 4.5 3.6 3.4* 3.7  CL 99 99 102 102 105 103  CO2 20* 15* 15* 18* 19* 22  GLUCOSE 434* 579* 512* 379* 301* 244*  BUN 20 30* 31* 33* 34* 41*  CREATININE 1.82* 3.25* 3.84* 4.09* 4.17* 4.72*  CALCIUM  7.3* 6.7* 6.7* 6.3* 6.4* 6.3*  MG 2.5* 2.5*  --   --  1.8  --   PHOS  --   --   --  5.2*  --   --    GFR: Estimated Creatinine Clearance: 27.2 mL/min (A) (by C-G formula based on SCr of 4.72 mg/dL (H)).  CBG: Recent Labs  Lab 05/15/22 0932 05/15/22 1038 05/15/22 1144 05/15/22 1306 05/15/22 1336  GLUCAP 186* 181* 178* 148* 168*   Lipid Profile: Recent Labs    05/15/22 0353  TRIG 306*   Sepsis  Labs: Recent Labs  Lab 05/14/22 2031 05/15/22 0043  LATICACIDVEN >9.0* >9.0*    Recent Results (from the past 240 hour(s))  MRSA Next Gen by PCR, Nasal     Status: None   Collection Time: 05/14/22  6:10 AM   Specimen: Nasal Mucosa; Nasal Swab  Result Value Ref Range Status   MRSA by PCR Next Gen NOT DETECTED NOT DETECTED Final    Comment: (NOTE) The GeneXpert MRSA Assay (FDA approved for NASAL specimens only), is one component of a comprehensive MRSA colonization surveillance program. It is not intended to diagnose MRSA infection nor to guide or monitor treatment for MRSA infections. Test performance is not FDA approved in patients less than 67 years old. Performed at [facility], [address] [place], [place] [address]      Radiology Studies: DG CHEST PORT 1 VIEW  Result Date: 05/14/2022 CLINICAL DATA:  61 year old male status post intubation and central venous catheter placement. EXAM: PORTABLE CHEST 1 VIEW COMPARISON:  Chest x-ray 05/12/2022. FINDINGS: An endotracheal tube is in place with tip 3.7 cm above the carina. No definite central venous catheter identified. Transcutaneous defibrillator pad projecting over the lower left hemithorax. Mild elevation of the right hemidiaphragm. New opacity in the right mid to lower lung partially obscuring the right heart border. Opacity in the medial aspect of the right upper lobe also noted. Left lung appears clear. No pneumothorax. No evidence of pulmonary edema. Heart size is normal. Upper mediastinal contours are within normal limits. IMPRESSION: 1. Support apparatus, as above. 2. Multifocal airspace consolidation in the right lung, concerning for developing right upper and right middle lobe bronchopneumonia. Electronically Signed   By: Trudie Reed M.D.   On: 05/14/2022 06:03    Scheduled Meds:  sodium chloride   Intravenous Once   allopurinol  300 mg Oral Daily   budesonide (PULMICORT) nebulizer solution  0.5 mg Nebulization BID    Chlorhexidine Gluconate Cloth  6 each Topical Daily   docusate  100 mg Per Tube BID   hydrocortisone sod succinate (SOLU-CORTEF) inj  100 mg Intravenous Q8H   insulin aspart  0-9 Units Subcutaneous TID WC   insulin detemir  12 Units Subcutaneous BID   levothyroxine  200 mcg Oral QAC breakfast   pantoprazole (PROTONIX) IV  40 mg Intravenous Q12H   polyethylene glycol  17 g Per Tube Daily   Continuous Infusions:  sodium chloride Stopped (05/14/22 2224)   dexmedetomidine (PRECEDEX) IV infusion 0.4 mcg/kg/hr (05/15/22 1318)   epinephrine 20 mcg/min (05/15/22 1318)   fentaNYL infusion INTRAVENOUS 200 mcg/hr (05/15/22 1318)   insulin 7.5 Units/hr (05/15/22 1318)   norepinephrine (LEVOPHED) Adult infusion 60 mcg/min (05/15/22 1318)   sodium bicarbonate 150 mEq in sterile water 1,150 mL infusion 100 mL/hr at 05/15/22 1318   vasopressin 0.04 Units/min (05/15/22 1318)     LOS: 1 day    Vassie Loll, MD [facility]  If 7PM-7AM,  please contact night-coverage [email or web address] 05/15/2022, 1:53 PM

## 2022-05-15 NOTE — Progress Notes (Signed)
Took report of critical calcium of 6.1, today at 1640.

## 2022-05-16 ENCOUNTER — Inpatient Hospital Stay (HOSPITAL_COMMUNITY): Payer: Medicare HMO

## 2022-05-16 DIAGNOSIS — G4733 Obstructive sleep apnea (adult) (pediatric): Secondary | ICD-10-CM | POA: Diagnosis not present

## 2022-05-16 DIAGNOSIS — K920 Hematemesis: Secondary | ICD-10-CM | POA: Diagnosis not present

## 2022-05-16 DIAGNOSIS — I2609 Other pulmonary embolism with acute cor pulmonale: Secondary | ICD-10-CM | POA: Diagnosis not present

## 2022-05-16 DIAGNOSIS — K922 Gastrointestinal hemorrhage, unspecified: Secondary | ICD-10-CM | POA: Diagnosis not present

## 2022-05-16 DIAGNOSIS — E1159 Type 2 diabetes mellitus with other circulatory complications: Secondary | ICD-10-CM | POA: Diagnosis not present

## 2022-05-16 LAB — BLOOD GAS, ARTERIAL
Acid-base deficit: 4.2 mmol/L — ABNORMAL HIGH (ref 0.0–2.0)
Bicarbonate: 20.1 mmol/L (ref 20.0–28.0)
FIO2: 60 %
O2 Saturation: 98.5 %
Patient temperature: 38.6
pCO2 arterial: 36 mmHg (ref 32–48)
pH, Arterial: 7.36 (ref 7.35–7.45)
pO2, Arterial: 95 mmHg (ref 83–108)

## 2022-05-16 LAB — PREPARE RBC (CROSSMATCH)

## 2022-05-16 LAB — CBC
HCT: 20.4 % — ABNORMAL LOW (ref 39.0–52.0)
HCT: 20.8 % — ABNORMAL LOW (ref 39.0–52.0)
Hemoglobin: 7 g/dL — ABNORMAL LOW (ref 13.0–17.0)
Hemoglobin: 7.1 g/dL — ABNORMAL LOW (ref 13.0–17.0)
MCH: 31.5 pg (ref 26.0–34.0)
MCH: 31.6 pg (ref 26.0–34.0)
MCHC: 34.3 g/dL (ref 30.0–36.0)
MCHC: 34.1 g/dL (ref 30.0–36.0)
MCV: 91.9 fL (ref 80.0–100.0)
MCV: 92.4 fL (ref 80.0–100.0)
Platelets: 112 10*3/uL — ABNORMAL LOW (ref 150–400)
Platelets: 105 10*3/uL — ABNORMAL LOW (ref 150–400)
RBC: 2.22 MIL/uL — ABNORMAL LOW (ref 4.22–5.81)
RBC: 2.25 MIL/uL — ABNORMAL LOW (ref 4.22–5.81)
RDW: 18.2 % — ABNORMAL HIGH (ref 11.5–15.5)
RDW: 17.7 % — ABNORMAL HIGH (ref 11.5–15.5)
WBC: 11.6 10*3/uL — ABNORMAL HIGH (ref 4.0–10.5)
WBC: 11.2 10*3/uL — ABNORMAL HIGH (ref 4.0–10.5)
nRBC: 0.3 % — ABNORMAL HIGH (ref 0.0–0.2)
nRBC: 0.4 % — ABNORMAL HIGH (ref 0.0–0.2)

## 2022-05-16 LAB — GLUCOSE, CAPILLARY
Glucose-Capillary: 290 mg/dL — ABNORMAL HIGH (ref 70–99)
Glucose-Capillary: 290 mg/dL — ABNORMAL HIGH (ref 70–99)
Glucose-Capillary: 285 mg/dL — ABNORMAL HIGH (ref 70–99)
Glucose-Capillary: 348 mg/dL — ABNORMAL HIGH (ref 70–99)
Glucose-Capillary: 357 mg/dL — ABNORMAL HIGH (ref 70–99)
Glucose-Capillary: 338 mg/dL — ABNORMAL HIGH (ref 70–99)

## 2022-05-16 LAB — ALBUMIN: Albumin: 2.3 g/dL — ABNORMAL LOW (ref 3.5–5.0)

## 2022-05-16 MED ORDER — ORAL CARE MOUTH RINSE
15.0000 mL | OROMUCOSAL | Status: DC
  Administered 2022-05-16 – 2022-05-28 (×149): 15 mL via OROMUCOSAL

## 2022-05-16 MED ORDER — ORAL CARE MOUTH RINSE: 15.0000 mL | OROMUCOSAL | Status: DC | PRN

## 2022-05-16 MED ORDER — SODIUM CHLORIDE 0.9 % IV SOLN
3.0000 g | Freq: Two times a day (BID) | INTRAVENOUS | Status: DC
  Administered 2022-05-16 – 2022-05-17 (×3): 3 g via INTRAVENOUS
  Filled 2022-05-16: qty 8
  Filled 2022-05-16 (×2): qty 3
  Filled 2022-05-16 (×2): qty 8

## 2022-05-16 MED ORDER — INSULIN ASPART 100 UNIT/ML IJ SOLN
0.0000 [IU] | INTRAMUSCULAR | Status: DC
  Administered 2022-05-16: 9 [IU] via SUBCUTANEOUS
  Administered 2022-05-17 (×2): 5 [IU] via SUBCUTANEOUS
  Administered 2022-05-17: 7 [IU] via SUBCUTANEOUS
  Administered 2022-05-17 (×2): 5 [IU] via SUBCUTANEOUS
  Administered 2022-05-17: 7 [IU] via SUBCUTANEOUS
  Administered 2022-05-17: 5 [IU] via SUBCUTANEOUS
  Administered 2022-05-18 (×2): 3 [IU] via SUBCUTANEOUS
  Administered 2022-05-18: 5 [IU] via SUBCUTANEOUS

## 2022-05-16 MED ORDER — SODIUM CHLORIDE 0.9% IV SOLUTION: Freq: Once | INTRAVENOUS | Status: AC

## 2022-05-16 NOTE — Progress Notes (Signed)
Pt has tolerated weaning from blood pressure support medications fairly well throughout the shift. Attending RN has been able to decrease all 3 pressors with BP still within titration range.   Pt also had a temp of 103.6 at the beginning of shift, but with multiple cooling agents applied (ice packs and cooling blanket), patient's fever broke and pt is now normothermic.   Pt has also been weaning off of sedation throughout the shift. Precedex has been off since 1013, and lowest Fentanyl gtt has been all shift was 50 mcg. The most the patient could do while at his most alert state was squeeze attending RN and mother's hands when prompted, and blink when asked questions.Gag and corneal reflex are present. Pt did get a little restless when sedation was at lowest weaning point, so sedation titrated back at a more comfortable level for the patient.

## 2022-05-16 NOTE — Progress Notes (Signed)
PROGRESS NOTE    Frank Pesa.  HCW:[id number] DOB: January 09, 1961 DOA: 05/12/2022 PCP: [name], FNP   Brief Narrative:    Frank Pesa. is a 62 y.o. male with medical history significant for  PE and DVT, DM, HTN, Gout, COPD, OSA.  Patient presented to the ED with complaints of difficulty breathing, chest pain started about 1 AM this morning.  Patient was admitted with pulmonary embolism and is now also noted to have left lower extremity DVT that developed after missing 3 doses of Xarelto at home.  He unfortunately had a cardiac arrest overnight on 12/29 with ROSC and is now intubated on ventilator and started on 3 pressors with ongoing soft blood pressure readings and inability to transfer.  Poor short-term prognosis noted with anticipated in-hospital death should he not wean off the pressors. Attempting to wean at this time with discontinuation of Propofol.    Febrile, with concern for aspiration pneumonia.  Still requiring pressors and with concern for ongoing upper GI bleed.  Continue holding anticoagulation; continue PPI, continue to follow hemoglobin and transfuse as needed.  Empirically started on antibiotics.  Assessment & Plan:   Principal Problem:   Pulmonary embolism (HCC) Active Problems:   GOUT   OBSTRUCTIVE SLEEP APNEA   Essential hypertension   Other specified chronic obstructive pulmonary disease   Hypercoagulopathy ([place])   DM type 2 causing vascular disease (HCC)   Upper GI bleed  Assessment and Plan:  Refractory circulatory shock status post PEA arrest secondary to pulmonary embolism ([place]) with DVT History of PE and hypercoagulable state, last dose of Xarelto 12/24, missed 3 days of Xarelto before onset of symptoms.  Presenting with chest pain, difficulty breathing, dizziness.  CTA chest shows multiple PE, possible chronic right heart failure.  Has been on Xarelto for well over 10 years.  Reports prior to xarelto he was on warfarin- since 1986 but his  blood was never thin enough.  He has bilateral lower extremity edema secondary to venous hypertension-saw Dr. Sherren Mocha 2018. -Patient was on full dose Lovenox but then underwent cardiac arrest on 12/29 -Now on 3 pressors with shock that is not improving -Started on hydrocortisone -2D echocardiogram 12/29 did not demonstrate any RV strain and vital signs have remained stable until the point of cardiac arrest -Alteplase administered and now with large hematoma near central venous line placement to right groin -Continue to wean pressors as tolerated and if this is possible, may consider transfer to [facility] service for further management as long as patient has intact neurological function.  At this time, during sedation holiday patient demonstrated ability to track and to open his eyes spontaneously.  Patient then became agitated and restless requiring sedation once again to be provided. -overall poor prognosis/guarded outcome.  Fever/aspiration pneumonia -Empirically started on Unasyn -Continue as needed antipyretics and supportive care -Repeat portable x-ray on 05/17/22   DM type 2 causing vascular disease ([place]) now with DKA -Improved/resolved with fluid resuscitation and insulin drip -Successfully treated and transition to a sliding scale insulin and Levemir twice a day. -A1c 11.4 demonstrating poor control.    Acute anemia/with concern for upper GI bleed. -Downtrending after administration of tenecteplase -Continue to hold on Lovenox for now -Continue to follow hemoglobin trend and transfuse as needed -Hemoglobin down to 7; 1 unit PRBCs has been ordered. -Case discussed with GI service was recommended endoscopic evaluation but not a candidate for procedure to be done at [facility].   Other specified chronic  obstructive pulmonary disease Quit smoking cigarettes 16 years ago. -Continue bronchodilator management and nebulizer. -Currently intubated.   Essential hypertension currently in  shock -Requiring multiple pressors to maintain adequate MAP -Continue holding home oral antihypertensive agents. -Wean off pressors as tolerated.   OBSTRUCTIVE SLEEP APNEA -Currently intubated   GOUT -Resume allopurinol when able to tolerate p.o.'s. -No signs of acute flare currently.   Morbid obesity BMI 45.59 -Patient will benefit of low calorie diet, portion control and increase physical activity if able to overcome this hospitalization.    DVT prophylaxis:Full dose lovenox, currently anticoagulation on hold secondary to hemoptysis and ongoing concerns for upper GI bleed. Code Status: DNR Family Communication: No family appreciated on today's examination. Disposition Plan:  Status is: Inpatient Remains inpatient appropriate because: IV medications/vent support.  CRITICAL CARE Performed by: Vassie Loll   Total critical care time: 60 minutes  Critical care time was exclusive of separately billable procedures and treating other patients.  Critical care was necessary to treat or prevent imminent or life-threatening deterioration.  Critical care was time spent personally by me on the following activities: development of treatment plan with patient and/or surrogate as well as nursing, discussions with consultants, evaluation of patient's response to treatment, examination of patient, obtaining history from patient or surrogate, ordering and performing treatments and interventions, ordering and review of laboratory studies, ordering and review of radiographic studies, pulse oximetry and re-evaluation of patient's condition.    Consultants:  Discussed with hematology Dr. Kirtland Bouchard 12/29 PCCM called overnight 12/29   Procedures:  Intubation 12/30 Central venous line placement 12/30   Antimicrobials:  None    Subjective: Sedated, intubated and mechanically ventilated; blood pressure has overall improved after adjustment to sedation made.  Patient febrile overnight.  Multiple  episode of coffee-ground emesis without melena.  Hemoglobin down to 7.0.  Continues to require pressor support.  Objective: Vitals:   05/16/22 0939 05/16/22 0940 05/16/22 0950 05/16/22 1000  BP:  (!) 118/51 (!) 116/51 (!) 126/55  Pulse: 95 95 93 93  Resp: (!) 21 19 15 20   Temp:      TempSrc:      SpO2: 98% 98% 98% 99%  Weight:      Height:        Intake/Output Summary (Last 24 hours) at 05/16/2022 1015 Last data filed at 05/16/2022 0948 Gross per 24 hour  Intake 5923.56 ml  Output 1050 ml  Net 4873.56 ml   Filed Weights   05/12/22 1139 05/12/22 2351 05/16/22 0500  Weight: (!) 149.7 kg (!) 169.9 kg (!) 169.9 kg    Examination: General exam: Febrile, intubated, sedated and mechanically ventilated; patient with multiple episodes of coffee-ground emesis throughout the day yesterday.  No melena. Respiratory system: No wheezing, no crackles, positive rhonchi bilaterally. Cardiovascular system: Rate controlled, no rubs, no gallops, unable to assess JVD with body habitus. Gastrointestinal system: Abdomen is obese, nondistended, soft and nontender. No organomegaly or masses felt. Normal bowel sounds heard. Central nervous system: Unable to properly assess given current sedation level. Extremities: No cyanosis or clubbing; 1+ edema appreciated bilaterally.  Chronic venous stasis disease dermatitis changes appreciated bilaterally. Skin: No petechiae. Psychiatry: Unable to properly assess given current sedation.   Data Reviewed: I have personally reviewed following labs and imaging studies  CBC: Recent Labs  Lab 05/14/22 1450 05/14/22 1959 05/15/22 0043 05/15/22 0353 05/16/22 0442  WBC 27.0* 25.7* 22.9* 21.4* 11.6*  HGB 9.9* 8.8* 8.6* 8.0* 7.0*  HCT 29.9* 26.9* 25.5* 24.1* 20.4*  MCV  102.7* 103.9* 97.3 96.8 91.9  PLT 141* 135* 115* 113* 789*   Basic Metabolic Panel: Recent Labs  Lab 05/14/22 0434 05/14/22 1450 05/14/22 1956 05/15/22 0043 05/15/22 0353 05/15/22 0812  05/15/22 1240  NA 133* 134* 135 137 139 139 139  K 3.9 4.6 4.5 3.6 3.4* 3.7 3.9  CL 99 99 102 102 105 103 103  CO2 20* 15* 15* 18* 19* 22 22  GLUCOSE 434* 579* 512* 379* 301* 244* 194*  BUN 20 30* 31* 33* 34* 41* 43*  CREATININE 1.82* 3.25* 3.84* 4.09* 4.17* 4.72* 5.16*  CALCIUM 7.3* 6.7* 6.7* 6.3* 6.4* 6.3* 6.1*  MG 2.5* 2.5*  --   --  1.8  --   --   PHOS  --   --   --  5.2*  --   --   --    GFR: Estimated Creatinine Clearance: 24.9 mL/min (A) (by C-G formula based on SCr of 5.16 mg/dL (H)).  CBG: Recent Labs  Lab 05/15/22 1725 05/15/22 2010 05/15/22 2310 05/16/22 0414 05/16/22 0748  GLUCAP 149* 209* 268* 290* 290*   Lipid Profile: Recent Labs    05/15/22 0353  TRIG 306*   Sepsis Labs: Recent Labs  Lab 05/14/22 2031 05/15/22 0043  LATICACIDVEN >9.0* >9.0*    Recent Results (from the past 240 hour(s))  MRSA Next Gen by PCR, Nasal     Status: None   Collection Time: 05/14/22  6:10 AM   Specimen: Nasal Mucosa; Nasal Swab  Result Value Ref Range Status   MRSA by PCR Next Gen NOT DETECTED NOT DETECTED Final    Comment: (NOTE) The GeneXpert MRSA Assay (FDA approved for NASAL specimens only), is one component of a comprehensive MRSA colonization surveillance program. It is not intended to diagnose MRSA infection nor to guide or monitor treatment for MRSA infections. Test performance is not FDA approved in patients less than 34 years old. Performed at [facility], [address], [place], [place] [address]      Radiology Studies: No results found.  Scheduled Meds:  sodium chloride   Intravenous Once   budesonide (PULMICORT) nebulizer solution  0.5 mg Nebulization BID   Chlorhexidine Gluconate Cloth  6 each Topical Daily   docusate  100 mg Per Tube BID   hydrocortisone sod succinate (SOLU-CORTEF) inj  100 mg Intravenous Q8H   insulin aspart  0-9 Units Subcutaneous TID WC   insulin detemir  12 Units Subcutaneous BID   levothyroxine  200 mcg Oral QAC breakfast    mouth rinse  15 mL Mouth Rinse Q2H   pantoprazole (PROTONIX) IV  40 mg Intravenous Q12H   polyethylene glycol  17 g Per Tube Daily   Continuous Infusions:  sodium chloride Stopped (05/14/22 2224)   ampicillin-sulbactam (UNASYN) IV 200 mL/hr at 05/16/22 0948   dexmedetomidine (PRECEDEX) IV infusion 0.4 mcg/kg/hr (05/16/22 0948)   epinephrine 10 mcg/min (05/16/22 0948)   fentaNYL infusion INTRAVENOUS 175 mcg/hr (05/16/22 0948)   insulin Stopped (05/15/22 1801)   norepinephrine (LEVOPHED) Adult infusion 16 mcg/min (05/16/22 0948)   vasopressin 0.04 Units/min (05/16/22 0948)     LOS: 2 days   Barton Dubois, MD [facility]  If 7PM-7AM, please contact night-coverage [email or web address] 05/16/2022, 10:15 AM

## 2022-05-16 NOTE — Progress Notes (Signed)
Patient remains on the ventilator.  More alert this morning. No per oral blood seen.  OG aspirate negative for blood.  No BM.  Per nursing staff, patient tolerating weaning of epinephrine, Levophed, fentanyl and Precedex so far today.  Multiple family members at the bedside.  Hemoglobin down to 7.0; additional unit of blood being given.  Vital signs in last 24 hours: Temp:  [98.2 F (36.8 C)-103.6 F (39.8 C)] 99.9 F (37.7 C) (01/01 1040) Pulse Rate:  [90-109] 90 (01/01 1100) Resp:  [15-33] 20 (01/01 1100) BP: (55-163)/(20-63) 130/57 (01/01 1100) SpO2:  [93 %-99 %] 99 % (01/01 1100) FiO2 (%):  [50 %-60 %] 50 % (01/01 0827) Weight:  [169.9 kg] 169.9 kg (01/01 0500) Last BM Date : 05/15/22 General:   Intubated.  Opens eyes with verbal stimulation.   Right sided tongue hematoma persist Abdomen: Obese, soft and non-ender.  Large hematoma right groin; also left groin and right neck  Intake/Output from previous day: 12/31 0701 - 01/01 0700 In: 4321.7 [I.V.:3376.2; Blood:385; NG/GT:210; IV Piggyback:350.5] Out: 450 [Urine:100; Emesis/NG output:350] Intake/Output this shift: Total I/O In: 2699.2 [I.V.:2499.1; NG/GT:100; IV Piggyback:100.1] Out: 600 [Emesis/NG output:600]  Lab Results: Recent Labs    05/15/22 0043 05/15/22 0353 05/16/22 0442  WBC 22.9* 21.4* 11.6*  HGB 8.6* 8.0* 7.0*  HCT 25.5* 24.1* 20.4*  PLT 115* 113* 112*   BMET Recent Labs    05/15/22 0353 05/15/22 0812 05/15/22 1240  NA 139 139 139  K 3.4* 3.7 3.9  CL 105 103 103  CO2 19* 22 22  GLUCOSE 301* 244* 194*  BUN 34* 41* 43*  CREATININE 4.17* 4.72* 5.16*  CALCIUM 6.4* 6.3* 6.1*   LFT Recent Labs    05/16/22 0944  ALBUMIN 2.3*   PT/INR No results for input(s): "LABPROT", "INR" in the last 72 hours. Hepatitis Panel No results for input(s): "HEPBSAG", "HCVAB", "HEPAIGM", "HEPBIGM" in the last 72 hours. C-Diff No results for input(s): "CDIFFTOX" in the last 72 hours.  Studies/Results: No  results found.  Assessment: Principal Problem:   Pulmonary embolism (HCC) Active Problems:   GOUT   OBSTRUCTIVE SLEEP APNEA   Essential hypertension   Other specified chronic obstructive pulmonary disease   Hypercoagulopathy (HCC)   DM type 2 causing vascular disease (HCC)   Upper GI bleed  Impression: Critically ill 62 year old gentleman with recent extensive pulmonary emboli complicated by PEA cardiac arrest successfully resuscitated; thrombolytic therapy administered; intubated in refractory shock requiring maximal maximal pressor dosing.  Coffee-ground emesis at the time of code/intubation/OG tube placement.  Has not had any further evidence of GI bleeding; no stool per rectum.  He has had a drop in his hemoglobin but is noted to have significant hematomas related to attempts at intravenous access.  I do not feel patient is having any significant GI bleeding at this time.  Drop in hemoglobin is multifactorial in etiology with a lesser component from GI blood loss.  Fortunately, the patient appears more alert today and he is tolerating pressor support taper.    Recommendations:  Continue daily PPI.  No need for endoscopic evaluation at this time.  Discussed GI impression and recommendations at length with patient's mother, Tylar Amborn and, son, Frank Shelton, at the bedside today.

## 2022-05-17 ENCOUNTER — Encounter (HOSPITAL_COMMUNITY): Payer: Self-pay

## 2022-05-17 ENCOUNTER — Inpatient Hospital Stay (HOSPITAL_COMMUNITY): Payer: Medicare HMO

## 2022-05-17 DIAGNOSIS — M1A9XX0 Chronic gout, unspecified, without tophus (tophi): Secondary | ICD-10-CM

## 2022-05-17 DIAGNOSIS — E875 Hyperkalemia: Secondary | ICD-10-CM

## 2022-05-17 DIAGNOSIS — I2609 Other pulmonary embolism with acute cor pulmonale: Secondary | ICD-10-CM

## 2022-05-17 DIAGNOSIS — R579 Shock, unspecified: Secondary | ICD-10-CM | POA: Diagnosis not present

## 2022-05-17 DIAGNOSIS — J9601 Acute respiratory failure with hypoxia: Secondary | ICD-10-CM

## 2022-05-17 DIAGNOSIS — K922 Gastrointestinal hemorrhage, unspecified: Secondary | ICD-10-CM | POA: Diagnosis not present

## 2022-05-17 DIAGNOSIS — J9602 Acute respiratory failure with hypercapnia: Secondary | ICD-10-CM

## 2022-05-17 DIAGNOSIS — N179 Acute kidney failure, unspecified: Secondary | ICD-10-CM

## 2022-05-17 LAB — GLUCOSE, CAPILLARY
Glucose-Capillary: 277 mg/dL — ABNORMAL HIGH (ref 70–99)
Glucose-Capillary: 283 mg/dL — ABNORMAL HIGH (ref 70–99)
Glucose-Capillary: 288 mg/dL — ABNORMAL HIGH (ref 70–99)
Glucose-Capillary: 296 mg/dL — ABNORMAL HIGH (ref 70–99)
Glucose-Capillary: 299 mg/dL — ABNORMAL HIGH (ref 70–99)
Glucose-Capillary: 306 mg/dL — ABNORMAL HIGH (ref 70–99)
Glucose-Capillary: 313 mg/dL — ABNORMAL HIGH (ref 70–99)

## 2022-05-17 LAB — BASIC METABOLIC PANEL
Anion gap: 14 (ref 5–15)
Anion gap: 15 (ref 5–15)
BUN: 81 mg/dL — ABNORMAL HIGH (ref 8–23)
BUN: 97 mg/dL — ABNORMAL HIGH (ref 8–23)
CO2: 21 mmol/L — ABNORMAL LOW (ref 22–32)
CO2: 22 mmol/L (ref 22–32)
Calcium: 5.7 mg/dL — CL (ref 8.9–10.3)
Calcium: 5.9 mg/dL — CL (ref 8.9–10.3)
Chloride: 100 mmol/L (ref 98–111)
Chloride: 99 mmol/L (ref 98–111)
Creatinine, Ser: 7.79 mg/dL — ABNORMAL HIGH (ref 0.61–1.24)
Creatinine, Ser: 8.7 mg/dL — ABNORMAL HIGH (ref 0.61–1.24)
GFR, Estimated: 7 mL/min — ABNORMAL LOW (ref >60)
GFR, Estimated: 6 mL/min — ABNORMAL LOW (ref >60)
Glucose, Bld: 352 mg/dL — ABNORMAL HIGH (ref 70–99)
Glucose, Bld: 304 mg/dL — ABNORMAL HIGH (ref 70–99)
Potassium: 6.1 mmol/L — ABNORMAL HIGH (ref 3.5–5.1)
Potassium: 5.8 mmol/L — ABNORMAL HIGH (ref 3.5–5.1)
Sodium: 135 mmol/L (ref 135–145)
Sodium: 136 mmol/L (ref 135–145)

## 2022-05-17 LAB — TYPE AND SCREEN
ABO/RH(D): A POS
Antibody Screen: NEGATIVE

## 2022-05-17 LAB — BLOOD CULTURE ID PANEL (REFLEXED) - BCID2

## 2022-05-17 LAB — BLOOD GAS, ARTERIAL
Acid-base deficit: 3.4 mmol/L — ABNORMAL HIGH (ref 0.0–2.0)
Bicarbonate: 21.2 mmol/L (ref 20.0–28.0)
Drawn by: 41977
FIO2: 40 %
O2 Saturation: 95.5 %
Patient temperature: 37
pCO2 arterial: 35 mmHg (ref 32–48)
pH, Arterial: 7.39 (ref 7.35–7.45)
pO2, Arterial: 68 mmHg — ABNORMAL LOW (ref 83–108)

## 2022-05-17 LAB — CBC
HCT: 20.4 % — ABNORMAL LOW (ref 39.0–52.0)
Hemoglobin: 6.9 g/dL — CL (ref 13.0–17.0)
MCH: 31.4 pg (ref 26.0–34.0)
MCHC: 33.8 g/dL (ref 30.0–36.0)
MCV: 92.7 fL (ref 80.0–100.0)
Platelets: 104 10*3/uL — ABNORMAL LOW (ref 150–400)
RBC: 2.2 MIL/uL — ABNORMAL LOW (ref 4.22–5.81)
RDW: 18.1 % — ABNORMAL HIGH (ref 11.5–15.5)
WBC: 9.3 10*3/uL (ref 4.0–10.5)
nRBC: 1.5 % — ABNORMAL HIGH (ref 0.0–0.2)

## 2022-05-17 LAB — HEMOGLOBIN AND HEMATOCRIT, BLOOD
HCT: 22.9 % — ABNORMAL LOW (ref 39.0–52.0)
Hemoglobin: 7.7 g/dL — ABNORMAL LOW (ref 13.0–17.0)

## 2022-05-17 LAB — POTASSIUM
Potassium: 6.5 mmol/L (ref 3.5–5.1)
Potassium: 5.6 mmol/L — ABNORMAL HIGH (ref 3.5–5.1)
Potassium: 5.3 mmol/L — ABNORMAL HIGH (ref 3.5–5.1)

## 2022-05-17 LAB — PREPARE RBC (CROSSMATCH)

## 2022-05-17 LAB — CREATININE, URINE, RANDOM: Creatinine, Urine: 151.03 mg/dL

## 2022-05-17 LAB — PROCALCITONIN: Procalcitonin: 24.48 ng/mL

## 2022-05-17 LAB — SODIUM, URINE, RANDOM: Sodium, Ur: 33 mmol/L

## 2022-05-17 MED ORDER — SODIUM BICARBONATE 8.4 % IV SOLN
INTRAVENOUS | Status: AC
  Administered 2022-05-17: 50 meq via INTRAVENOUS
  Filled 2022-05-17: qty 50

## 2022-05-17 MED ORDER — SODIUM BICARBONATE 8.4 % IV SOLN: 50.0000 meq | Freq: Once | INTRAVENOUS | Status: AC

## 2022-05-17 MED ORDER — SODIUM CHLORIDE 0.9 % IV SOLN
3.0000 g | INTRAVENOUS | Status: DC
  Administered 2022-05-18 – 2022-05-22 (×5): 3 g via INTRAVENOUS
  Filled 2022-05-17 (×6): qty 8

## 2022-05-17 MED ORDER — CALCIUM GLUCONATE 10 % IV SOLN
1.0000 g | Freq: Once | INTRAVENOUS | Status: DC
  Filled 2022-05-17: qty 10

## 2022-05-17 MED ORDER — INSULIN DETEMIR 100 UNIT/ML ~~LOC~~ SOLN
20.0000 [IU] | Freq: Two times a day (BID) | SUBCUTANEOUS | Status: DC
  Administered 2022-05-17 – 2022-05-18 (×2): 20 [IU] via SUBCUTANEOUS
  Filled 2022-05-17 (×4): qty 0.2

## 2022-05-17 MED ORDER — DEXTROSE 50 % IV SOLN: 1.0000 | Freq: Once | INTRAVENOUS | Status: DC

## 2022-05-17 MED ORDER — SODIUM ZIRCONIUM CYCLOSILICATE 10 G PO PACK
10.0000 g | PACK | Freq: Once | ORAL | Status: AC
  Administered 2022-05-17: 10 g via ORAL
  Filled 2022-05-17: qty 1

## 2022-05-17 MED ORDER — CALCIUM GLUCONATE-NACL 1-0.675 GM/50ML-% IV SOLN
1.0000 g | Freq: Once | INTRAVENOUS | Status: AC
  Administered 2022-05-17: 1000 mg via INTRAVENOUS

## 2022-05-17 MED ORDER — CALCIUM CHLORIDE 10 % IV SOLN
INTRAVENOUS | Status: AC
  Filled 2022-05-17: qty 10

## 2022-05-17 MED ORDER — CALCIUM GLUCONATE-NACL 1-0.675 GM/50ML-% IV SOLN
INTRAVENOUS | Status: AC
  Filled 2022-05-17: qty 50

## 2022-05-17 MED ORDER — LEVOTHYROXINE SODIUM 100 MCG PO TABS
200.0000 ug | ORAL_TABLET | Freq: Every day | ORAL | Status: DC
  Administered 2022-05-19 – 2022-05-31 (×13): 200 ug
  Filled 2022-05-17 (×13): qty 2

## 2022-05-17 MED ORDER — CALCIUM GLUCONATE-NACL 1-0.675 GM/50ML-% IV SOLN
1.0000 g | Freq: Once | INTRAVENOUS | Status: AC
  Administered 2022-05-17: 1000 mg via INTRAVENOUS
  Filled 2022-05-17: qty 50

## 2022-05-17 MED ORDER — ACETAMINOPHEN 325 MG PO TABS
650.0000 mg | ORAL_TABLET | Freq: Four times a day (QID) | ORAL | Status: DC | PRN
  Administered 2022-05-23 – 2022-06-02 (×5): 650 mg
  Filled 2022-05-17 (×6): qty 2

## 2022-05-17 MED ORDER — ACETAMINOPHEN 650 MG RE SUPP: 650.0000 mg | Freq: Four times a day (QID) | RECTAL | Status: DC | PRN

## 2022-05-17 MED ORDER — INSULIN ASPART 100 UNIT/ML IV SOLN
5.0000 [IU] | Freq: Once | INTRAVENOUS | Status: AC
  Administered 2022-05-17: 5 [IU] via INTRAVENOUS

## 2022-05-17 MED ORDER — CALCIUM GLUCONATE-NACL 1-0.675 GM/50ML-% IV SOLN
2.0000 g | Freq: Once | INTRAVENOUS | Status: AC
  Administered 2022-05-17: 2000 mg via INTRAVENOUS
  Filled 2022-05-17: qty 100

## 2022-05-17 MED ORDER — CALCIUM GLUCONATE-NACL 2-0.675 GM/100ML-% IV SOLN: 2.0000 g | Freq: Once | INTRAVENOUS | Status: DC

## 2022-05-17 MED ORDER — SODIUM ZIRCONIUM CYCLOSILICATE 10 G PO PACK
10.0000 g | PACK | Freq: Once | ORAL | Status: AC
  Administered 2022-05-17: 10 g
  Filled 2022-05-17: qty 1

## 2022-05-17 MED ORDER — IPRATROPIUM-ALBUTEROL 0.5-2.5 (3) MG/3ML IN SOLN
3.0000 mL | Freq: Four times a day (QID) | RESPIRATORY_TRACT | Status: DC
  Administered 2022-05-17 – 2022-05-21 (×16): 3 mL via RESPIRATORY_TRACT
  Filled 2022-05-17 (×16): qty 3

## 2022-05-17 MED ORDER — SODIUM BICARBONATE 8.4 % IV SOLN
50.0000 meq | Freq: Once | INTRAVENOUS | Status: AC
  Administered 2022-05-17: 50 meq via INTRAVENOUS
  Filled 2022-05-17: qty 50

## 2022-05-17 MED ORDER — SODIUM CHLORIDE 0.9% IV SOLUTION: Freq: Once | INTRAVENOUS | Status: AC

## 2022-05-17 MED FILL — Medication: Qty: 1 | Status: AC

## 2022-05-17 NOTE — Progress Notes (Signed)
[name] Progress Note Patient Name: Frank Shelton. DOB: 1960-07-04 MRN: [id number]   Date of Service  05/17/2022  HPI/Events of Note  Hyperkalemia  Hypocalemia - K+ = 5.8, Ca++ = 5.9 and Creatinine = 8.7.  eICU Interventions  Plan: Lokelma 10 gm PO now. Calcium gluconate 2 gm IV now. Continue to trend K+.      Intervention Category Major Interventions: Electrolyte abnormality - evaluation and management  Lysle Dingwall 05/17/2022, 8:49 PM

## 2022-05-17 NOTE — Progress Notes (Cosign Needed Addendum)
Patient seen briefly this morning.  Nursing reported clay colored stool and bile-like material via NG tube.  He has been more alert on the ventilator has been able to wean off of epinephrine completely.  Remains on vasopressin and norepinephrine.  Hemoglobin 6.9 this morning was receiving 1 unit PRBC at time of my exam.  Anemia likely multifactorial however does not appear to have significant GI bleed at this time.  He has rhonchi bilaterally on exam as well as +2 pretibial edema and hypoactive bowel sounds.  NG tube output has been heme-negative.  He continues to be critically ill and is not stable enough for any endoscopic evaluation at this time nor does it seem warranted at this time.  Consider further evaluation of anemia after improved from critical illness.  Given his current state no endoscopic evaluation could be performed at [facility].  He is awaiting transfer to [facility] for further care.  GI will likely be signing off today.  Please not hesitate to reach out any further assistance is needed.  Venetia Night, MSN, APRN, FNP-BC, AGACNP-BC [facility] at [facility]

## 2022-05-17 NOTE — Consult Note (Addendum)
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 but missed a few doses over xmas and presented 12/28 with recurrent PE in setting of  DVT >L> R on Dopplers with RV/LV 1.2 no change from CT June 11, 2020 and echo ok but coded am 06-Jun-2023 and transferred to ICU where  given Altepase empirically and noted to have R groin hematoma at sight of R CVL during code with one arterial stick and hgb dropping therefafter with neg GI source to date but all anticoagulation held and PCCM service asked to see pending transfer to [facility] when bed available with worsening uop/increasing K and creat developing while still on pressors and full vent support.   Pertinent  Medical History    Significant Hospital Events: Including procedures, antibiotic start and stop dates in addition to other pertinent events   L fem cvl  2023-06-06 >>> MRSA PCR screen 2023/06/06 > neg  BC x 2 1/1  > one /two pos gpc   Interim History / Subjective:  Sedated on vent/ lifting up arms but not f/c   Objective   Blood pressure 116/61, pulse 75, temperature 100.3 F (37.9 C), temperature source Rectal, resp. rate (!) 32, height 6\' 4"  (1.93 m), weight (!) 169.9 kg, SpO2 97 %.    Vent Mode: PRVC FiO2 (%):  [40 %] 40 % Set Rate:  [26 bmp-32 bmp] 26 bmp Vt Set:  [010 mL] 690 mL PEEP:  [5 cmH20] 5 cmH20 Plateau Pressure:  [15 cmH20-22 cmH20] 22 cmH20   Intake/Output Summary (Last 24 hours) at 05/17/2022 1623 Last data filed at 05/17/2022 1138 Gross per 24 hour  Intake 2207.02 ml  Output 500 ml  Net 1707.02 ml   Filed Weights   05/12/22 1139 05/12/22 2351 05/16/22 0500  Weight: (!) 149.7 kg (!) 169.9 kg (!) 169.9 kg    Examination: General: obese wm / sedated on vent breathing above backup  Tmax 103.6 > 99 overnight  Lungs:  insp/exp rhonchi  Cardiovascular: RRR   Abdomen: soft/ absent BS/ no obvious GIB  Extremities: severe chronic venous changes both LE's / trace pittiing/ coold feet  Neuro: sedated on vent   I personally reviewed images and agree with radiology impression as follows:  CXR:   portable  2/2 Cardiomegaly with central pulmonary vascular congestion. Stable support apparatus. Right basilar opacity  My review: ? Aspiration on R      Assessment & Plan:  1)  Shock/ pressor dependent s/p cardiac arrest p PE/ given altepase with subsequent finding of coffee grounds in stomach and expanding hematomas where lines were attempted / ? Could he be septic suggested by very high PCT but no source identified  >>> wean pressors/ vol expand with PRBC's / avoid air trapping on vent >>> for ? Asp at arrest >  continue unasyn 1/1  (vanc not needed with neg MRSA PCR screen esp in view of renal function)   2) vent dep resp failure with air trapping >>> increase VT, decrease Ti and rx airways with duoneb  3) PE/dvt acute on ? Chronic s/p altpase 06-06-23 am but no further active bleeding  >>> need to consider filter and at some point restarting heparin but this is very dicy balance between opposite needs at this point / reviewed  the difficulty inherent in these choices with pt's fm who wants everything short of futile care so NCB status but seek transfer to [facility]   4)  AKI Lab Results  Component Value Date   CREATININE 7.79 (H) 05/17/2022   CREATININE 5.16 (H) 05/15/2022   CREATININE 4.72 (H) 05/15/2022   >>> keep tank full/ may need lokelma/ [facility] at [facility]   5) Acute blood loss anemia/ mulple hematoma's but no obvious ongoing GIb and GI signed off 2/2   Lab Results  Component Value Date   HGB 7.7 (L) 05/17/2022   HGB 6.9 (LL) 05/17/2022   HGB 7.1 (L) 05/16/2022  Improved p 1 unit this am but did not get the full 1 gm increase which may suggest ongoing blood loss  >>> ideally aim for  hgb > 8 if still on  pressors (but beware K load, rx lokelma per [facility].   6 Thrombocytopenia/ likely acute / consumptive Lab Results  Component Value Date   PLT 104 (L) 05/17/2022   PLT 105 (L) 05/16/2022   PLT 112 (L) 05/16/2022    Best Practice (right click and "Reselect all SmartList Selections" daily)   Diet/type: NPO w/ meds via tube DVT prophylaxis: not indicated GI prophylaxis: PPI Lines: Central line Foley:  Yes, and it is still needed Code Status:  DNR Last date of multidisciplinary goals of care discussion > discussed with fm at bedside at 1 pm 2/2  Labs   CBC: Recent Labs  Lab 05/15/22 0043 05/15/22 0353 05/16/22 0442 05/16/22 1608 05/17/22 0443 05/17/22 1421  WBC 22.9* 21.4* 11.6* 11.2* 9.3  --   HGB 8.6* 8.0* 7.0* 7.1* 6.9* 7.7*  HCT 25.5* 24.1* 20.4* 20.8* 20.4* 22.9*  MCV 97.3 96.8 91.9 92.4 92.7  --   PLT 115* 113* 112* 105* 104*  --     Basic Metabolic Panel: Recent Labs  Lab 05/14/22 0434 05/14/22 1450 05/14/22 1956 05/15/22 0043 05/15/22 0353 05/15/22 0812 05/15/22 1240 05/17/22 0443 05/17/22 1421  NA 133* 134*   < > 137 139 139 139 135  --   K 3.9 4.6   < > 3.6 3.4* 3.7 3.9 6.1* 6.5*  CL 99 99   < > 102 105 103 103 100  --   CO2 20* 15*   < > 18* 19* 22 22 21*  --   GLUCOSE 434* 579*   < > 379* 301* 244* 194* 352*  --   BUN 20 30*   < > 33* 34* 41* 43* 81*  --   CREATININE 1.82* 3.25*   < > 4.09* 4.17* 4.72* 5.16* 7.79*  --   CALCIUM 7.3* 6.7*   < > 6.3* 6.4* 6.3* 6.1* 5.7*  --   MG 2.5* 2.5*  --   --  1.8  --   --   --   --   PHOS  --   --   --  5.2*  --   --   --   --   --    < > = values in this interval not displayed.   GFR: Estimated Creatinine Clearance: 16.5 mL/min (A) (by C-G formula based on SCr of 7.79 mg/dL (H)). Recent Labs  Lab 05/14/22 2031 05/15/22 0043 05/15/22 0353 05/16/22 0442 05/16/22 1608 05/17/22 0443 05/17/22 1421  PROCALCITON  --   --   --   --   --   --  24.48  WBC  --  22.9* 21.4* 11.6* 11.2* 9.3  --  LATICACIDVEN  >9.0* >9.0*  --   --   --   --   --     Liver Function Tests: Recent Labs  Lab 05/16/22 0944  ALBUMIN 2.3*   No results for input(s): "LIPASE", "AMYLASE" in the last 168 hours. No results for input(s): "AMMONIA" in the last 168 hours.  ABG    Component Value Date/Time   PHART 7.36 05/16/2022 0310   PCO2ART 36 05/16/2022 0310   PO2ART 95 05/16/2022 0310   HCO3 20.1 05/16/2022 0310   TCO2 26.8 06/01/2010 0402   ACIDBASEDEF 4.2 (H) 05/16/2022 0310   O2SAT 98.5 05/16/2022 0310     Coagulation Profile: No results for input(s): "INR", "PROTIME" in the last 168 hours.  Cardiac Enzymes: No results for input(s): "CKTOTAL", "CKMB", "CKMBINDEX", "TROPONINI" in the last 168 hours.  HbA1C: Hgb A1c MFr Bld  Date/Time Value Ref Range Status  05/12/2022 12:23 PM 11.4 (H) 4.8 - 5.6 % Final    Comment:    (NOTE)         Prediabetes: 5.7 - 6.4         Diabetes: >6.4         Glycemic control for adults with diabetes: <7.0   05/16/2020 03:07 PM 8.6 (H) 4.8 - 5.6 % Final    Comment:    (NOTE) Pre diabetes:          5.7%-6.4%  Diabetes:              >6.4%  Glycemic control for   <7.0% adults with diabetes     CBG: Recent Labs  Lab 05/16/22 2311 05/17/22 0442 05/17/22 0739 05/17/22 1018 05/17/22 1555  GLUCAP 338* 299* 296* 283* 306*       Past Medical History:  He,  has a past medical history of Arthritis, Bronchitis, Complication of anesthesia, Diabetes mellitus without complication ([place]), Dyspnea, Embolism - blood clot, Gout, Headache, Hypertension, Hypothyroidism, Myocardial infarction ([place]) (2012), OSA on CPAP, Peripheral vascular disease ([place]), Pulmonary embolism ([place]), and Stroke ([place]) (2014).   Surgical History:   Past Surgical History:  Procedure Laterality Date   HELLER MYOTOMY  2014   [place]   IVC FILTER INSERTION  2010   ROTATOR CUFF REPAIR Right    SKIN GRAFT     x3- to face., child    THYROIDECTOMY N/A 03/09/2017   Procedure: TOTAL  THYROIDECTOMY;  Surgeon: Armandina Gemma, MD;  Location: [place];  Service: General;  Laterality: N/A;   TOTAL THYROIDECTOMY  03/09/2017     Social History:   reports that he quit smoking about 16 years ago. His smoking use included cigarettes. He started smoking about 52 years ago. He has a 30.00 pack-year smoking history. He has quit using smokeless tobacco. He reports that he does not currently use alcohol. He reports that he does not use drugs.   Family History:  His family history includes Heart attack in his maternal grandfather, maternal grandmother, and mother; Heart attack (age of onset: 29) in his sister.   Allergies Allergies  Allergen Reactions   Nabumetone Hives     Home Medications  Prior to Admission medications   Medication Sig Start Date End Date Taking? Authorizing Provider  acetaminophen (TYLENOL) 325 MG tablet Take 650 mg by mouth every 6 (six) hours as needed for moderate pain or headache.   Yes [provider]  albuterol (VENTOLIN HFA) 108 (90 Base) MCG/ACT inhaler Inhale 2 puffs into the lungs every 4 (four) hours as needed for  wheezing or shortness of breath. 05/18/20  Yes Shah, Pratik D, DO  allopurinol (ZYLOPRIM) 300 MG tablet Take 300 mg by mouth daily.   Yes [provider]  aspirin EC 81 MG tablet Take 81 mg by mouth daily. Swallow whole.   Yes [provider]  atorvastatin (LIPITOR) 40 MG tablet Take 40 mg by mouth daily.   Yes [provider]  budesonide-formoterol (SYMBICORT) 160-4.5 MCG/ACT inhaler Inhale 2 puffs into the lungs 2 (two) times daily as needed (for shortness of breath).    Yes [provider]  Cholecalciferol (VITAMIN D3) 5000 units CAPS Take 5,000 Units by mouth 2 (two) times daily.   Yes [provider]  colchicine 0.6 MG tablet Take 0.6 mg by mouth daily as needed (for gout flare/pain).    Yes [provider]  Cyanocobalamin (B-12) 5000 MCG CAPS Take 5,000 mcg by mouth daily.    Yes  [provider]  furosemide (LASIX) 20 MG tablet Take 20 mg by mouth daily as needed.    Yes [provider]  levothyroxine (SYNTHROID, LEVOTHROID) 200 MCG tablet TAKE 1 TABLET EVERY DAY Patient taking differently: Take 200 mcg by mouth daily before breakfast. 09/28/17  Yes Nida, Marella Chimes, MD  Multiple Vitamin (MULTIVITAMIN WITH MINERALS) TABS tablet Take 1 tablet by mouth daily.   Yes [provider]  vitamin C (ASCORBIC ACID) 500 MG tablet Take 500 mg by mouth daily.   Yes [provider]  XARELTO 20 MG TABS tablet TAKE 1 TABLET EVERY DAY Patient taking differently: Take 20 mg by mouth daily with supper. 10/10/18  Yes Herminio Commons, MD  calcium carbonate (TUMS) 500 MG chewable tablet Chew 2 tablets (400 mg of elemental calcium total) by mouth 4 (four) times daily. Patient not taking: Reported on 05/12/2022 03/11/17   Armandina Gemma, MD  calcium-vitamin D (OSCAL 500/200 D-3) 500-200 MG-UNIT tablet Take 1 tablet by mouth 2 (two) times daily. Patient not taking: Reported on 05/12/2022 02/02/17   Aviva Signs, MD  isosorbide mononitrate (IMDUR) 30 MG 24 hr tablet Take 30 mg by mouth every day Patient not taking: Reported on 05/12/2022 12/07/17   Herminio Commons, MD  methocarbamol (ROBAXIN) 500 MG tablet Take 1 tablet (500 mg total) by mouth 2 (two) times daily as needed for muscle spasms. Patient not taking: Reported on 05/12/2022 11/08/21   Noemi Chapel, MD     The patient is critically ill with multiple organ systems failure and requires high complexity decision making for assessment and support, frequent evaluation and titration of therapies, application of advanced monitoring technologies and extensive interpretation of multiple databases. Critical Care Time devoted to patient care services described in this note is 60  minutes.   Christinia Gully, MD Pulmonary and [place] [phone]   After 7:00 pm  call Elink  [phone]

## 2022-05-17 NOTE — Progress Notes (Signed)
Blood culture critical result- aerobic bottle positive for gram positive cocci. MD aware, awaiting sensitivity.

## 2022-05-17 NOTE — Progress Notes (Signed)
Critical calcium 5.9 called at 2030. Anders Simmonds, MD notified. Potasium also came back 5.8 and Creatinine 8.7. New orders for 2g calcium gluconate and 10g Lokelma placed. MD order to continue trending potassium. VSS.

## 2022-05-17 NOTE — Progress Notes (Signed)
PROGRESS NOTE    Frank Shelton.  AOZ:[id number] DOB: [birth year] DOA: 05/12/2022 PCP: Vanessa La Follette, FNP   Brief Narrative:    Frank Shelton. is a 62 y.o. male with medical history significant for  PE and DVT, DM, HTN, Gout, COPD, OSA.  Patient presented to the ED with complaints of difficulty breathing, chest pain started about 1 AM this morning.  Patient was admitted with pulmonary embolism and is now also noted to have left lower extremity DVT that developed after missing 3 doses of Xarelto at home.  He unfortunately had a cardiac arrest overnight on 12/29 with ROSC and is now intubated on ventilator and started on 3 pressors with ongoing soft blood pressure readings and inability to transfer.  Poor short-term prognosis noted with anticipated in-hospital death should he not wean off the pressors. Attempting to wean at this time with discontinuation of Propofol.    Febrile, with concern for aspiration pneumonia.  Still requiring pressors and with concern for ongoing upper GI bleed.  Continue holding anticoagulation; continue PPI, continue to follow hemoglobin and transfuse as needed.  Empirically started on antibiotics. Renal function continue to deteriorate and has hyperkalemia this morning.  Assessment & Plan:   Principal Problem:   Pulmonary embolism (HCC) Active Problems:   GOUT   OBSTRUCTIVE SLEEP APNEA   Essential hypertension   Other specified chronic obstructive pulmonary disease   Hypercoagulopathy (HCC)   DM type 2 causing vascular disease (HCC)   Upper GI bleed   Coffee ground emesis  Assessment and Plan:  Refractory circulatory shock status post PEA arrest secondary to pulmonary embolism (HCC) with acute LLE DVT History of PE and hypercoagulable state, last dose of Xarelto 12/24, missed 3 days of Xarelto before onset of symptoms.  Presenting with chest pain, difficulty breathing, dizziness.  CTA chest shows multiple PE, possible chronic right heart failure.  Has  been on Xarelto for well over 10 years.  Reports prior to xarelto he was on warfarin- since 1986 but his blood was never thin enough.  He has bilateral lower extremity edema secondary to venous hypertension-saw Dr. Tawanna Cooler 2018. -Patient was on full dose Lovenox but then underwent cardiac arrest on 12/29 and with concerns for upper GI bleed. Anticoagulation on hold currently. -Now on 2 pressors; blood pressure overall improved. -Continue hydrocortisone -2D echocardiogram 12/29 did not demonstrate any RV strain and vital signs have remained stable until the point of cardiac arrest -Alteplase administered and now with large hematoma near central venous line placement to left groin -Continue to wean pressors as tolerated and if this is possible, transferring to PCCM service at [facility] for further critical care management and if needed initiation of CVVH. -At this time, during sedation holiday patient demonstrated ability to track and to open his eyes spontaneously.  Overall improvement in sedation and mentation response appreciated with the use of Precedex. -Continue close monitoring and daily evaluations. -overall poor prognosis/guarded outcome. -After discussing with PCCM (Dr. Sherene Sires), patient might require IV filter.  Fever/aspiration pneumonia -Empirically started on Unasyn -Continue as needed antipyretics and supportive care -Repeat portable x-ray on 05/17/22  Acute renal failure/hyperkalemia -most likely in the setting of ATN from cardiac arrest and decrease perfusion with hypotension. -continue to minimize nephrotoxic agents as much as possible -lokelma X 1 dose for hyperkalemia will be given; patient also received insulin, sodium bicarbonate and calcium gluconate.   DM type 2 causing vascular disease (HCC) now with DKA -Improved/resolved with fluid resuscitation and insulin  drip -Successfully treated and transition to a sliding scale insulin and Levemir twice a day. -A1c 11.4 demonstrating poor  control.    Acute anemia/with concern for upper GI bleed. -Downtrending after administration of tenecteplase -Continue to hold on Lovenox for now -Continue to follow hemoglobin trend and transfuse as needed -Hemoglobin down to 6.9; 1 more unit PRBCs has been ordered. -Case discussed with GI service, who recommended endoscopic evaluation at some point,but not a candidate for procedure to be done at [facility]. -no overt bleeding currently appreciated.   Other specified chronic obstructive pulmonary disease Quit smoking cigarettes 16 years ago. -Continue bronchodilator management and nebulizer. -Currently intubated.   Essential hypertension currently in shock -Requiring multiple pressors to maintain adequate MAP -Continue holding home oral antihypertensive agents. -continue to Wean off pressors as tolerated.   OBSTRUCTIVE SLEEP APNEA -Currently intubated -repeat ABG stable.   GOUT -Resume allopurinol at discharge. -No signs of acute flare currently.   Morbid obesity BMI 45.59 -Patient will benefit of low calorie diet, portion control and increase physical activity if able to overcome this hospitalization.  Social ethics: -overall guarded/poor prognosis -patient DNR; but family interested in transfering him to [facility] for further critical care management and initiation of CVVH if indicated. -continue current level of care.    DVT prophylaxis:Full dose lovenox, currently anticoagulation on hold secondary to hemoptysis and ongoing concerns for upper GI bleed with decrease in Hgb level and component of thrombocytopenia..  Code Status: DNR  Family Communication: Son updated over the phone.  Disposition Plan:  Status is: Inpatient Remains inpatient appropriate because: IV medications/vent support.  CRITICAL CARE Performed by: Barton Dubois   Total critical care time: 60 minutes  Critical care time was exclusive of separately billable procedures and treating other  patients.  Critical care was necessary to treat or prevent imminent or life-threatening deterioration.  Critical care was time spent personally by me on the following activities: development of treatment plan with patient and/or surrogate as well as nursing, discussions with consultants, evaluation of patient's response to treatment, examination of patient, obtaining history from patient or surrogate, ordering and performing treatments and interventions, ordering and review of laboratory studies, ordering and review of radiographic studies, pulse oximetry and re-evaluation of patient's condition.     Consultants:  Discussed with hematology Dr. Raliegh Ip 12/29 PCCM called overnight 12/29   Procedures:  Intubation 12/30 Central venous line placement 12/30   Antimicrobials:  None    Subjective: Sedated, intubated and mechanically ventilated; no overt bleeding appreciated.  Improvement in blood pressure appreciated, currently afebrile. Demonstrating decline in his renal function and having hyperkalemia earlier.                                                                                                               Objective: Vitals:   05/17/22 0900 05/17/22 1000 05/17/22 1040 05/17/22 1106  BP: (!) 100/52 98/60 116/61   Pulse: 76 75 75   Resp: (!) 28 (!) 32 (!) 32   Temp:   99.2 F (37.3  C)   TempSrc:   Rectal   SpO2: 95% 97% 97% 97%  Weight:      Height:        Intake/Output Summary (Last 24 hours) at 05/17/2022 1126 Last data filed at 05/17/2022 1040 Gross per 24 hour  Intake 2320.05 ml  Output 625 ml  Net 1695.05 ml   Filed Weights   05/12/22 1139 05/12/22 2351 05/16/22 0500  Weight: (!) 149.7 kg (!) 169.9 kg (!) 169.9 kg    Examination: General exam: Sedated, intubated and mechanically ventilated; currently afebrile. Respiratory system: No crackles appreciated ; Positive scattered rhonchi.  No using accessory muscles.  Ventilated, PRVC, sed rate 32, bold onset 690, PEEP  5, peak airway pressure 26, mean airway pressure 14, FiO2 40%. Cardiovascular system: Rate controlled, no rubs, no gallops, unable to properly assess for JVD due to body habitus. Gastrointestinal system: Abdomen is obese, nondistended, soft and nontender. No organomegaly or masses felt. Normal bowel sounds heard. Central nervous system: Unable to properly assess given current level of sedation. Extremities: No cyanosis or clubbing; 2+ edema bilaterally extending up to his thighs.  Chronic venous stasis dermatitis changes appreciated. Skin: No petechiae. Psychiatry: Unable to properly assess given current level of sedation.  Data Reviewed: I have personally reviewed following labs and imaging studies  CBC: Recent Labs  Lab 05/15/22 0043 05/15/22 0353 05/16/22 0442 05/16/22 1608 05/17/22 0443  WBC 22.9* 21.4* 11.6* 11.2* 9.3  HGB 8.6* 8.0* 7.0* 7.1* 6.9*  HCT 25.5* 24.1* 20.4* 20.8* 20.4*  MCV 97.3 96.8 91.9 92.4 92.7  PLT 115* 113* 112* 105* 326*   Basic Metabolic Panel: Recent Labs  Lab 05/14/22 0434 05/14/22 1450 05/14/22 1956 05/15/22 0043 05/15/22 0353 05/15/22 0812 05/15/22 1240 05/17/22 0443  NA 133* 134*   < > 137 139 139 139 135  K 3.9 4.6   < > 3.6 3.4* 3.7 3.9 6.1*  CL 99 99   < > 102 105 103 103 100  CO2 20* 15*   < > 18* 19* 22 22 21*  GLUCOSE 434* 579*   < > 379* 301* 244* 194* 352*  BUN 20 30*   < > 33* 34* 41* 43* 81*  CREATININE 1.82* 3.25*   < > 4.09* 4.17* 4.72* 5.16* 7.79*  CALCIUM 7.3* 6.7*   < > 6.3* 6.4* 6.3* 6.1* 5.7*  MG 2.5* 2.5*  --   --  1.8  --   --   --   PHOS  --   --   --  5.2*  --   --   --   --    < > = values in this interval not displayed.   GFR: Estimated Creatinine Clearance: 16.5 mL/min (A) (by C-G formula based on SCr of 7.79 mg/dL (H)).  CBG: Recent Labs  Lab 05/16/22 1948 05/16/22 2311 05/17/22 0442 05/17/22 0739 05/17/22 1018  GLUCAP 357* 338* 299* 296* 283*   Lipid Profile: Recent Labs    05/15/22 0353  TRIG  306*   Sepsis Labs: Recent Labs  Lab 05/14/22 2031 05/15/22 0043  LATICACIDVEN >9.0* >9.0*    Recent Results (from the past 240 hour(s))  MRSA Next Gen by PCR, Nasal     Status: None   Collection Time: 05/14/22  6:10 AM   Specimen: Nasal Mucosa; Nasal Swab  Result Value Ref Range Status   MRSA by PCR Next Gen NOT DETECTED NOT DETECTED Final    Comment: (NOTE) The GeneXpert MRSA Assay (FDA approved for  NASAL specimens only), is one component of a comprehensive MRSA colonization surveillance program. It is not intended to diagnose MRSA infection nor to guide or monitor treatment for MRSA infections. Test performance is not FDA approved in patients less than 51 years old. Performed at [facility], [address] [place], [place] [address]   Culture, blood (Routine X 2) w Reflex to ID Panel     Status: None (Preliminary result)   Collection Time: 05/16/22  9:44 AM   Specimen: BLOOD  Result Value Ref Range Status   Specimen Description BLOOD BLOOD RIGHT HAND  Final   Special Requests   Final    BOTTLES DRAWN AEROBIC AND ANAEROBIC Blood Culture adequate volume   Culture   Final    NO GROWTH < 24 HOURS Performed at [facility], [address] [place], [place] [address]    Report Status PENDING  Incomplete  Culture, blood (Routine X 2) w Reflex to ID Panel     Status: None (Preliminary result)   Collection Time: 05/16/22  9:44 AM   Specimen: BLOOD  Result Value Ref Range Status   Specimen Description BLOOD BLOOD LEFT HAND  Final   Special Requests   Final    BOTTLES DRAWN AEROBIC AND ANAEROBIC Blood Culture results may not be optimal due to an excessive volume of blood received in culture bottles   Culture   Final    NO GROWTH < 24 HOURS Performed at [facility], [address], [place], [place] [address]    Report Status PENDING  Incomplete     Radiology Studies: DG CHEST PORT 1 VIEW  Result Date: 05/17/2022 CLINICAL DATA:  Shortness of breath. EXAM: PORTABLE CHEST 1  VIEW COMPARISON:  May 14, 2022. FINDINGS: Stable cardiomegaly. Mild central pulmonary vascular congestion is noted. Endotracheal and nasogastric tubes appear to be in grossly good position. Right basilar atelectasis is noted with associated effusion. Bony thorax is unremarkable. IMPRESSION: Cardiomegaly with central pulmonary vascular congestion. Stable support apparatus. Right basilar opacity as described above. Electronically Signed   By: Lupita Raider M.D.   On: 05/17/2022 08:21    Scheduled Meds:  sodium chloride   Intravenous Once   budesonide (PULMICORT) nebulizer solution  0.5 mg Nebulization BID   calcium chloride       Chlorhexidine Gluconate Cloth  6 each Topical Daily   dextrose  1 ampule Intravenous Once   docusate  100 mg Per Tube BID   hydrocortisone sod succinate (SOLU-CORTEF) inj  100 mg Intravenous Q8H   insulin aspart  0-9 Units Subcutaneous Q4H   insulin detemir  12 Units Subcutaneous BID   levothyroxine  200 mcg Oral QAC breakfast   mouth rinse  15 mL Mouth Rinse Q2H   pantoprazole (PROTONIX) IV  40 mg Intravenous Q12H   polyethylene glycol  17 g Per Tube Daily   Continuous Infusions:  sodium chloride Stopped (05/14/22 2224)   ampicillin-sulbactam (UNASYN) IV 3 g (05/17/22 1006)   dexmedetomidine (PRECEDEX) IV infusion 0.5 mcg/kg/hr (05/17/22 1119)   epinephrine Stopped (05/17/22 0156)   fentaNYL infusion INTRAVENOUS 150 mcg/hr (05/17/22 0650)   norepinephrine (LEVOPHED) Adult infusion 14 mcg/min (05/17/22 0650)   vasopressin 0.03 Units/min (05/17/22 1044)     LOS: 3 days   Vassie Loll, MD [facility]  If 7PM-7AM, please contact night-coverage [email or web address] 05/17/2022, 11:26 AM

## 2022-05-17 NOTE — Progress Notes (Signed)
[facility] Progress Note Patient Name: Frank Shelton. DOB: 04/19/1961 MRN: [id number]   Date of Service  05/17/2022  HPI/Events of Note  Notified of K 6.1, creatinine 7.79, Ca 5.7 Hgb 6.9 with no report of active bleed Oliguric but able to come of epinephrine Still on vaso and levo  eICU Interventions  Ordered hyperkalemia protocol including calcium gluconate Ordered to transfuse 1 unit PRBC, bedside team to obtain consent Discussion ongoing regarding transfer of patient to [facility] once more stable Discussed with BSRN     Intervention Category Intermediate Interventions: Electrolyte abnormality - evaluation and management  Judd Lien 05/17/2022, 6:11 AM

## 2022-05-17 NOTE — Progress Notes (Signed)
Critical Lab results: CA 5.7 Hgb 6.9. Reported critical results to E-Link. Awaiting orders

## 2022-05-17 NOTE — Progress Notes (Signed)
Date and time results received: 05/17/22 1535 (use smartphrase ".now" to insert current time)  Test: Potassium  Critical Value: 6.5  Name of Provider Notified: Dr Dyann Kief and Marlon Pel, patient RN  Orders Received? Or Actions Taken?: Orders Received - See Orders for details

## 2022-05-17 NOTE — Progress Notes (Signed)
Inpatient Diabetes Program Recommendations  AACE/ADA: New Consensus Statement on Inpatient Glycemic Control (2015)  Target Ranges:  Prepandial:   less than 140 mg/dL      Peak postprandial:   less than 180 mg/dL (1-2 hours)      Critically ill patients:  140 - 180 mg/dL   Lab Results  Component Value Date   GLUCAP 283 (H) 05/17/2022   HGBA1C 11.4 (H) 05/12/2022    Review of Glycemic Control  Latest Reference Range & Units 05/16/22 19:48 05/16/22 23:11 05/17/22 04:42 05/17/22 07:39 05/17/22 10:18  Glucose-Capillary 70 - 99 mg/dL 357 (H) 338 (H) 299 (H) 296 (H) 283 (H)   Diabetes history: DM  Outpatient Diabetes medications:  Semaglutide- ran out 2 weeks ago Current orders for Inpatient glycemic control:  Solucortef 100 mg IV q 8 hours Levemir 12 units bid Novolog sensitive q 4 hours  Inpatient Diabetes Program Recommendations:    Consider increasing Levemir to 20 units bid.  If blood sugars remain>200 mg/dL, may also consider increasing Novolog correction to moderate q 4 hours.    Thanks,  Adah Perl, RN, BC-ADM Inpatient Diabetes Coordinator Pager [phone]  (8a-5p)

## 2022-05-18 ENCOUNTER — Inpatient Hospital Stay (HOSPITAL_COMMUNITY): Payer: Medicare HMO

## 2022-05-18 DIAGNOSIS — I5021 Acute systolic (congestive) heart failure: Secondary | ICD-10-CM

## 2022-05-18 DIAGNOSIS — K920 Hematemesis: Secondary | ICD-10-CM

## 2022-05-18 DIAGNOSIS — E1159 Type 2 diabetes mellitus with other circulatory complications: Secondary | ICD-10-CM | POA: Diagnosis not present

## 2022-05-18 DIAGNOSIS — I2694 Multiple subsegmental pulmonary emboli without acute cor pulmonale: Secondary | ICD-10-CM | POA: Diagnosis not present

## 2022-05-18 DIAGNOSIS — K922 Gastrointestinal hemorrhage, unspecified: Secondary | ICD-10-CM | POA: Diagnosis not present

## 2022-05-18 DIAGNOSIS — G4733 Obstructive sleep apnea (adult) (pediatric): Secondary | ICD-10-CM | POA: Diagnosis not present

## 2022-05-18 DIAGNOSIS — J9601 Acute respiratory failure with hypoxia: Secondary | ICD-10-CM | POA: Diagnosis not present

## 2022-05-18 DIAGNOSIS — I2609 Other pulmonary embolism with acute cor pulmonale: Secondary | ICD-10-CM | POA: Diagnosis not present

## 2022-05-18 DIAGNOSIS — I469 Cardiac arrest, cause unspecified: Secondary | ICD-10-CM | POA: Diagnosis not present

## 2022-05-18 LAB — BPAM RBC
Blood Product Expiration Date: 202401052359
Blood Product Expiration Date: 202401182359
Blood Product Expiration Date: 202401202359
Blood Product Expiration Date: 202401202359
ISSUE DATE / TIME: 202312302223
ISSUE DATE / TIME: 202312310535
ISSUE DATE / TIME: 202401011145
ISSUE DATE / TIME: 202401020817
Unit Type and Rh: 600
Unit Type and Rh: 6200
Unit Type and Rh: 6200
Unit Type and Rh: 6200

## 2022-05-18 LAB — POCT I-STAT 7, (LYTES, BLD GAS, ICA,H+H)
Acid-base deficit: 6 mmol/L — ABNORMAL HIGH (ref 0.0–2.0)
Bicarbonate: 17.3 mmol/L — ABNORMAL LOW (ref 20.0–28.0)
Calcium, Ion: 0.86 mmol/L — CL (ref 1.15–1.40)
HCT: 22 % — ABNORMAL LOW (ref 39.0–52.0)
Hemoglobin: 7.5 g/dL — ABNORMAL LOW (ref 13.0–17.0)
O2 Saturation: 96 %
Patient temperature: 97
Potassium: 4.9 mmol/L (ref 3.5–5.1)
Sodium: 136 mmol/L (ref 135–145)
TCO2: 18 mmol/L — ABNORMAL LOW (ref 22–32)
pCO2 arterial: 23.9 mmHg — ABNORMAL LOW (ref 32–48)
pH, Arterial: 7.465 — ABNORMAL HIGH (ref 7.35–7.45)
pO2, Arterial: 70 mmHg — ABNORMAL LOW (ref 83–108)

## 2022-05-18 LAB — BASIC METABOLIC PANEL
Anion gap: 12 (ref 5–15)
Anion gap: 18 — ABNORMAL HIGH (ref 5–15)
BUN: 103 mg/dL — ABNORMAL HIGH (ref 8–23)
BUN: 110 mg/dL — ABNORMAL HIGH (ref 8–23)
CO2: 23 mmol/L (ref 22–32)
CO2: 20 mmol/L — ABNORMAL LOW (ref 22–32)
Calcium: 6.1 mg/dL — CL (ref 8.9–10.3)
Calcium: 6.3 mg/dL — CL (ref 8.9–10.3)
Chloride: 100 mmol/L (ref 98–111)
Chloride: 99 mmol/L (ref 98–111)
Creatinine, Ser: 9.1 mg/dL — ABNORMAL HIGH (ref 0.61–1.24)
Creatinine, Ser: 8.94 mg/dL — ABNORMAL HIGH (ref 0.61–1.24)
GFR, Estimated: 6 mL/min — ABNORMAL LOW (ref >60)
GFR, Estimated: 6 mL/min — ABNORMAL LOW (ref >60)
Glucose, Bld: 297 mg/dL — ABNORMAL HIGH (ref 70–99)
Glucose, Bld: 292 mg/dL — ABNORMAL HIGH (ref 70–99)
Potassium: 5.5 mmol/L — ABNORMAL HIGH (ref 3.5–5.1)
Potassium: 5 mmol/L (ref 3.5–5.1)
Sodium: 135 mmol/L (ref 135–145)
Sodium: 137 mmol/L (ref 135–145)

## 2022-05-18 LAB — POTASSIUM
Potassium: 5.3 mmol/L — ABNORMAL HIGH (ref 3.5–5.1)
Potassium: 5.4 mmol/L — ABNORMAL HIGH (ref 3.5–5.1)

## 2022-05-18 LAB — CBC
HCT: 21.8 % — ABNORMAL LOW (ref 39.0–52.0)
Hemoglobin: 7.2 g/dL — ABNORMAL LOW (ref 13.0–17.0)
MCH: 30.1 pg (ref 26.0–34.0)
MCHC: 33 g/dL (ref 30.0–36.0)
MCV: 91.2 fL (ref 80.0–100.0)
Platelets: 87 10*3/uL — ABNORMAL LOW (ref 150–400)
RBC: 2.39 MIL/uL — ABNORMAL LOW (ref 4.22–5.81)
RDW: 18.9 % — ABNORMAL HIGH (ref 11.5–15.5)
WBC: 9 10*3/uL (ref 4.0–10.5)
nRBC: 9.1 % — ABNORMAL HIGH (ref 0.0–0.2)

## 2022-05-18 LAB — TYPE AND SCREEN
ABO/RH(D): A POS
Antibody Screen: NEGATIVE
Unit division: 0
Unit division: 0
Unit division: 0
Unit division: 0

## 2022-05-18 LAB — GLUCOSE, CAPILLARY
Glucose-Capillary: 343 mg/dL — ABNORMAL HIGH (ref 70–99)
Glucose-Capillary: 240 mg/dL — ABNORMAL HIGH (ref 70–99)
Glucose-Capillary: 276 mg/dL — ABNORMAL HIGH (ref 70–99)
Glucose-Capillary: 235 mg/dL — ABNORMAL HIGH (ref 70–99)
Glucose-Capillary: 296 mg/dL — ABNORMAL HIGH (ref 70–99)
Glucose-Capillary: 221 mg/dL — ABNORMAL HIGH (ref 70–99)
Glucose-Capillary: 232 mg/dL — ABNORMAL HIGH (ref 70–99)
Glucose-Capillary: 201 mg/dL — ABNORMAL HIGH (ref 70–99)
Glucose-Capillary: 161 mg/dL — ABNORMAL HIGH (ref 70–99)
Glucose-Capillary: 147 mg/dL — ABNORMAL HIGH (ref 70–99)
Glucose-Capillary: 135 mg/dL — ABNORMAL HIGH (ref 70–99)

## 2022-05-18 LAB — URINE CULTURE: Culture: 30000 — AB

## 2022-05-18 LAB — TROPONIN I (HIGH SENSITIVITY)
Troponin I (High Sensitivity): 116 ng/L (ref <18)
Troponin I (High Sensitivity): 125 ng/L (ref <18)

## 2022-05-18 LAB — ECHOCARDIOGRAM COMPLETE
Area-P 1/2: 2.8 cm2
Height: 76 in
Single Plane A4C EF: 53.1 %
Weight: 6321.03 oz

## 2022-05-18 LAB — PROCALCITONIN: Procalcitonin: 22.55 ng/mL

## 2022-05-18 MED ORDER — PROSOURCE TF20 ENFIT COMPATIBL EN LIQD
60.0000 mL | Freq: Three times a day (TID) | ENTERAL | Status: DC
  Administered 2022-05-18 – 2022-05-20 (×7): 60 mL
  Filled 2022-05-18 (×7): qty 60

## 2022-05-18 MED ORDER — CALCIUM GLUCONATE-NACL 1-0.675 GM/50ML-% IV SOLN: 1.0000 g | Freq: Once | INTRAVENOUS | Status: DC

## 2022-05-18 MED ORDER — DEXTROSE 50 % IV SOLN: 0.0000 mL | INTRAVENOUS | Status: DC | PRN

## 2022-05-18 MED ORDER — SODIUM CHLORIDE 0.9 % IV SOLN: 250.0000 mL | INTRAVENOUS | Status: DC

## 2022-05-18 MED ORDER — VITAL AF 1.2 CAL PO LIQD
1000.0000 mL | ORAL | Status: DC
  Administered 2022-05-18: 1000 mL

## 2022-05-18 MED ORDER — CALCIUM GLUCONATE-NACL 1-0.675 GM/50ML-% IV SOLN
1.0000 g | Freq: Once | INTRAVENOUS | Status: AC
  Administered 2022-05-18: 1000 mg via INTRAVENOUS
  Filled 2022-05-18: qty 50

## 2022-05-18 MED ORDER — SODIUM ZIRCONIUM CYCLOSILICATE 10 G PO PACK
10.0000 g | PACK | Freq: Once | ORAL | Status: AC
  Administered 2022-05-18: 10 g
  Filled 2022-05-18: qty 1

## 2022-05-18 MED ORDER — CALCIUM GLUCONATE-NACL 1-0.675 GM/50ML-% IV SOLN
INTRAVENOUS | Status: AC
  Filled 2022-05-18: qty 50

## 2022-05-18 MED ORDER — CALCIUM GLUCONATE-NACL 2-0.675 GM/100ML-% IV SOLN
2.0000 g | Freq: Once | INTRAVENOUS | Status: AC
  Administered 2022-05-18: 2000 mg via INTRAVENOUS
  Filled 2022-05-18: qty 100

## 2022-05-18 MED ORDER — FUROSEMIDE 10 MG/ML IJ SOLN
160.0000 mg | Freq: Two times a day (BID) | INTRAVENOUS | Status: DC
  Administered 2022-05-18 – 2022-05-23 (×11): 160 mg via INTRAVENOUS
  Filled 2022-05-18: qty 16
  Filled 2022-05-18: qty 10
  Filled 2022-05-18: qty 16
  Filled 2022-05-18: qty 10
  Filled 2022-05-18: qty 16
  Filled 2022-05-18: qty 10
  Filled 2022-05-18: qty 16
  Filled 2022-05-18 (×5): qty 10
  Filled 2022-05-18 (×3): qty 16
  Filled 2022-05-18: qty 10

## 2022-05-18 MED ORDER — INSULIN REGULAR(HUMAN) IN NACL 100-0.9 UT/100ML-% IV SOLN
INTRAVENOUS | Status: DC
  Administered 2022-05-18: 14 [IU]/h via INTRAVENOUS
  Filled 2022-05-18: qty 100

## 2022-05-18 MED ORDER — HYDROCORTISONE SOD SUC (PF) 100 MG IJ SOLR
100.0000 mg | Freq: Two times a day (BID) | INTRAMUSCULAR | Status: DC
  Administered 2022-05-19: 100 mg via INTRAVENOUS
  Filled 2022-05-18: qty 2

## 2022-05-18 MED ORDER — RENA-VITE PO TABS
1.0000 | ORAL_TABLET | Freq: Every day | ORAL | Status: DC
  Administered 2022-05-18 – 2022-06-09 (×23): 1
  Filled 2022-05-18 (×23): qty 1

## 2022-05-18 MED ORDER — NOREPINEPHRINE 4 MG/250ML-% IV SOLN
2.0000 ug/min | INTRAVENOUS | Status: DC
  Administered 2022-05-19 – 2022-05-20 (×2): 2 ug/min via INTRAVENOUS
  Filled 2022-05-18: qty 250

## 2022-05-18 NOTE — Progress Notes (Signed)
An USGPIV (ultrasound guided PIV) has been placed for short-term vasopressor infusion. A correctly placed ivWatch must be used when administering Vasopressors. Should this treatment be needed beyond 72 hours, central line access should be obtained.  It will be the responsibility of the bedside nurse to follow best practice to prevent extravasations.  HS Gladyce Mcray RN 

## 2022-05-18 NOTE — Progress Notes (Signed)
Critical result calcium 6.1. Dr. Oletta Darter notified. Potassium 5.5 up from 5.3. Creatinine 9.10 up from 8.70. See new orders.

## 2022-05-18 NOTE — Consult Note (Addendum)
f  NAME:  Frank Shelton., MRN:  [id number], DOB:  1960-12-13, LOS: 3 ADMISSION DATE:  05/12/2022, CONSULTATION DATE:  05/17/22 REFERRING MD:  [place], CHIEF COMPLAINT:  vent dep resp failure   History of Present Illness:  53 yowm with h/o PE/DVT on long term xarleto but missed a few doses over xmas and presented 12/28 with recurrent PE in setting of  DVT >L> R on Dopplers with RV/LV 1.2 no change from CT 2020/05/29 and echo ok but coded am May 25, 2023 and transferred to ICU where  given Altepase empirically and noted to have R groin hematoma at sight of R CVL during code with one arterial stick and hgb dropping therefafter with neg GI source to date but all anticoagulation held and PCCM service asked to see pending transfer to [facility] when bed available with worsening uop/increasing K and creat developing while still on pressors and full vent support.   Pertinent  Medical History  obesity, T2DM, HTN, gout, COPD, hyperlipidemia. He also has a history of previous DVT/PE on xarelto.   Significant Hospital Events: Including procedures, antibiotic start and stop dates in addition to other pertinent events   2023/05/25: Cardiac arrest while in BR. CPR  initiated. TNK administered during CPR, fem cvl  placed/ post arrest shock, high dose pressors. Failed attempt on right femoral and right IJ access. Time to ROSC estimated over 30 minutes. Hgb dropped 12 to 8.8  12/31 on 3 pressors and stress dose steroids. In DKA. Insulin gtt started. LMWH placed on hold.  MRSA PCR screen 25-May-2023 > neg  BC x 2 1/1  > one /two pos gpc + staph hominis. Unasyn started.  1/3 transferred to [facility]    Interim History / Subjective:   Objective   Blood pressure 116/61, pulse 75, temperature 100.3 F (37.9 C), temperature source Rectal, resp. rate (!) 32, height 6\' 4"  (1.93 m), weight (!) 169.9 kg, SpO2 97 %.    Vent Mode: PRVC FiO2 (%):  [40 %] 40 % Set Rate:  [26 bmp-32 bmp] 26 bmp Vt Set:  [295 mL] 690 mL PEEP:  [5 cmH20] 5  cmH20 Plateau Pressure:  [15 cmH20-22 cmH20] 22 cmH20   Intake/Output Summary (Last 24 hours) at 05/17/2022 1623 Last data filed at 05/17/2022 1138 Gross per 24 hour  Intake 2207.02 ml  Output 500 ml  Net 1707.02 ml   Filed Weights   05/12/22 1139 05/12/22 2351 05/16/22 0500  Weight: (!) 149.7 kg (!) 169.9 kg (!) 169.9 kg    Examination: General 62 year old male sedated on vent  HENT NCAT orally intubated Pulm dec bases Card rrr Abd soft Ext chronic LE edema and venous stasis changes Neuro sedated moves purposefully  Gu cl yellow     Assessment & Plan:  Circulatory shock s/p cardiac arrest. Admitted w/ PE given altepase Primary working dx: hemorrhagic c/b aspiration/sepsis Plan Repeat echo I wonder about progressive RV failure s/p PE Cont to wean NE for MAP > 65 CVP and obtain SCVO2 Repeat echo Tele  Holding ac  Cont stress dose steroids  PE/dvt acute on ? Chronic s/p altpase 05-25-2023 am but no further active bleeding   Still has residual DVT LLE and ch DVT RLL; he already has IVC filter  Plan AC on hold.    vent dep resp failure s/p cardiac arrest superimposed on underlying COPD and OSA Plan Full vent support  VAP bundle PAD protocol  Am cxr  Aspiration PNA vs HCAP Plan Day 3 Unasyn  Staph Hominis bacteremia (oxacillin sens) Plan Cont Unasyn  Repeat Blood culture Try for line holiday If we can get fem out  Consider TEE  AKI w/ worsening renal fxn and Hyperkalemia Plan Will notify renal of arrival  Making excellent urine so will hold off on hd cath  Acute blood loss anemia/ mulple hematoma's but no obvious ongoing GIb and GI signed off 1/2 Plan Cont PPI BID [place] AC   Thrombocytopenia/ likely acute / consumptive  Plan Monitor   Hypothyroidism  Plan Cont synthroid   DM w/ severe hyperglycemia Plan Insulin gtt   H/o Gout Plan Symptomatic support   Best Practice (right click and "Reselect all SmartList Selections" daily)   Diet/type:  NPO w/ meds via tube DVT prophylaxis: not indicated GI prophylaxis: PPI Lines: Central line Foley:  Yes, and it is still needed Code Status:  DNR Last date of multidisciplinary goals of care discussion > discussed with fm at bedside at 1 pm 2/2  My time 44 min   Erick Colace ACNP-BC [place] Pager # [phone] OR # [phone] if no answer

## 2022-05-18 NOTE — Progress Notes (Addendum)
Initial Nutrition Assessment  DOCUMENTATION CODES:   Morbid obesity  INTERVENTION:  Initiate tube feeding via NG: Start Vital 1.5 at 54ml and advance by 44ml q8h to a goal rate of 57ml/hr (1440 ml per day) Prosource TF20 60 ml TID  Provides 2400 kcal, 157 gm protein, 1100 ml free water daily  Renal MVI with minerals daily   NUTRITION DIAGNOSIS:   Inadequate oral intake related to acute illness as evidenced by NPO status.  GOAL:   Patient will meet greater than or equal to 90% of their needs  MONITOR:   Vent status, Labs, Weight trends, I & O's  REASON FOR ASSESSMENT:   Consult, Ventilator Enteral/tube feeding initiation and management  ASSESSMENT:   Pt admitted with chest pain and difficulty breathing, found to have multiple pulmonary emboli and LLE DVT. PMH significant for PE, DVT, DM, HTN, gout, COPD, OSA.  12/28: admitted 12/30: cardiac arrest; intubated   No family present at time of visit. Pt remains intubated and sedated.  Pt noted to consume 50% x2 meals on 12/29. He has remained NPO without nutrition x 5 days. Plans to initiate TF today via NGT.   Nephrology following d/t worsening renal function. Pending transfer to [facility] for likely need for CRRT. Nephrology challenging with high dose lasix to help with volume and potassium. Has also been receiving lokelma.   GI consulted d/t coffee ground NGT output, anemia likely multifactorial. No significant GIB at this time. Would benefit from further evaluation of anemia once more clinically stable.   Patient is currently intubated on ventilator support MV: 14.5 L/min Temp (24hrs), Avg:98.2 F (36.8 C), Min:97.7 F (36.5 C), Max:98.9 F (37.2 C)  Weight noted to increase during admission. Dry weight unknown given presence of edema.  Admit weight: 169.9 kg Current weight: 179.2 kg  Edema: moderate pitting generalized, mild pitting BUE, deep pitting BLE  Medications: colace, solu-cortef, SSI 0-9 units q4h, levemir  20 units BID, protonix, miralax IV drips: precedex, fentanyl, lasix, levo @ 61mcg/min, vaso @ 0.04 units/min  Labs: potassium 5.4, BUN 103, Cr 9.10, Ca 6.1, GFR 6, HgbA1c 11.4%, CBG's 235-313 x24 hours  NGT output: 565ml x24 hours UOP: 348ml x24 hours I/O's: +20L since admit  NUTRITION - FOCUSED PHYSICAL EXAM: No muscle or subcutaneous fat losses noted, if losses present, it is likely masked by body habitus/presence of edema.   Diet Order:   Diet Order             Diet NPO time specified  Diet effective now                   EDUCATION NEEDS:   No education needs have been identified at this time  Skin:  Skin Assessment: Reviewed RN Assessment  Last BM:  12/31  Height:   Ht Readings from Last 1 Encounters:  05/18/22 6\' 4"  (1.93 m)    Weight:   Wt Readings from Last 1 Encounters:  05/18/22 (!) 179.2 kg    Ideal Body Weight:  91.8 kg  BMI:  Body mass index is 48.09 kg/m.  Estimated Nutritional Needs:   Kcal:  2200-2400  Protein:  140-160g  Fluid:  1L + UOP  Clayborne Dana, RDN, LDN Clinical Nutrition

## 2022-05-18 NOTE — Progress Notes (Signed)
PROGRESS NOTE    Frank Shelton.  ONG:[id number] DOB: Dec 27, 1960 DOA: 05/12/2022 PCP: Vanessa Natural Steps, FNP   Brief Narrative:    Frank Shelton. is a 62 y.o. male with medical history significant for  PE and DVT, DM, HTN, Gout, COPD, OSA.  Patient presented to the ED with complaints of difficulty breathing, chest pain started about 1 AM this morning.  Patient was admitted with pulmonary embolism and is now also noted to have left lower extremity DVT that developed after missing 3 doses of Xarelto at home.  He unfortunately had a cardiac arrest overnight on 12/29 with ROSC and is now intubated on ventilator and started on 3 pressors with ongoing soft blood pressure readings and inability to transfer.  Poor short-term prognosis noted with anticipated in-hospital death should he not wean off the pressors. Attempting to wean at this time with discontinuation of Propofol.    Febrile, with concern for aspiration pneumonia.  Still requiring pressors and with concern for ongoing upper GI bleed.  Continue holding anticoagulation; continue PPI, continue to follow hemoglobin and transfuse as needed.  Empirically started on antibiotics. Renal function continue to deteriorate and has hyperkalemia this morning.  Assessment & Plan:   Principal Problem:   Pulmonary embolism (HCC) Active Problems:   GOUT   OBSTRUCTIVE SLEEP APNEA   Essential hypertension   Other specified chronic obstructive pulmonary disease   Hypercoagulopathy (HCC)   DM type 2 causing vascular disease (HCC)   Upper GI bleed   Coffee ground emesis  Assessment and Plan:  Refractory circulatory shock status post PEA arrest secondary to pulmonary embolism (HCC) with acute LLE DVT History of PE and hypercoagulable state, last dose of Xarelto 12/24, missed 3 days of Xarelto before onset of symptoms.  Presenting with chest pain, difficulty breathing, dizziness.  CTA chest shows multiple PE, possible chronic right heart failure.  Has  been on Xarelto for well over 10 years.  Reports prior to xarelto he was on warfarin- since 1986 but his blood was never thin enough.  He has bilateral lower extremity edema secondary to venous hypertension-saw Dr. Tawanna Cooler 2018. -Patient was on full dose Lovenox but then underwent cardiac arrest on 12/29 and with concerns for upper GI bleed. Anticoagulation on hold currently. -Now on 2 pressors; blood pressure overall improved. -Continue hydrocortisone -2D echocardiogram 12/29 did not demonstrate any RV strain and vital signs have remained stable until the point of cardiac arrest -Alteplase administered and now with large hematoma near central venous line placement to right groin; some bruises appreciated on left groin central access area. -Continue to wean pressors as tolerated and if this is possible, transferring to PCCM service at [facility] for further critical care management and if needed initiation of CVVH. -At this time, during sedation holiday patient demonstrated ability to track and to open his eyes spontaneously.  Overall improvement in sedation and mentation response appreciated with the use of Precedex. -Continue close monitoring and daily evaluations. -overall poor prognosis/guarded outcome. -After discussing with PCCM (Dr. Sherene Sires), patient might require IV filter. -Patient's blood pressure continues stabilizing and is almost off pressors (receiving low dose levophed and vasopressin)  Fever/aspiration pneumonia -Empirically started on Unasyn -Continue as needed antipyretics and supportive care -follow clinical response.  Acute renal failure/hyperkalemia -most likely in the setting of ATN from cardiac arrest and decrease perfusion with hypotension. -continue to minimize nephrotoxic agents as much as possible -lokelma X 3 doses for hyperkalemia given; patient also received insulin, sodium bicarbonate  and calcium gluconate. -Most recent potassium 5.3; creatinine up to 9.1. -Patient will most  likely require CVVH; per nephrology recommendations Lasix challenge will be provided. -Follow-up renal ultrasound and urinalysis results. -Baseline creatinine less than 1.0.   DM type 2 causing vascular disease (HCC) now with DKA -Improved/resolved with fluid resuscitation and insulin drip -Successfully treated and transition to a sliding scale insulin and Levemir twice a day. -A1c 11.4 demonstrating poor control.    Acute anemia/with concern for upper GI bleed. -Downtrending after administration of tenecteplase -Continue to hold on Lovenox for now -Continue to follow hemoglobin trend and transfuse as needed -Hemoglobin 7.2 currently; 3 units PRBC provided. -Case discussed with GI service, who recommended endoscopic evaluation at some point,but not a candidate for procedure to be done at [facility]. -no overt bleeding currently appreciated.   Other specified chronic obstructive pulmonary disease Quit smoking cigarettes 16 years ago. -Continue bronchodilator management and nebulizer. -Currently intubated.   Essential hypertension currently in shock -Requiring multiple pressors to maintain adequate MAP -Continue holding home oral antihypertensive agents. -continue to Wean off pressors as tolerated.   OBSTRUCTIVE SLEEP APNEA -Currently intubated -repeat ABG stable.   GOUT -Resume allopurinol at discharge. -No signs of acute flare currently.   Morbid obesity BMI 45.59 -Patient will benefit of low calorie diet, portion control and increase physical activity if able to overcome this hospitalization.  Social ethics: -overall guarded/poor prognosis -patient DNR; but family interested in transfering him to [facility] for further critical care management and initiation of CVVH if indicated. -continue current level of care. -Accepted by PCCM service at [facility]; No beds available currently. -Continue to follow clinical response.    DVT prophylaxis:Full dose lovenox, currently anticoagulation  on hold secondary to hemoptysis and ongoing concerns for upper GI bleed with decrease in Hgb level and component of thrombocytopenia..  Consults: Nephrology, PCCM  Code Status: DNR  Family Communication: Son updated over the phone.  Disposition Plan:  Status is: Inpatient Remains inpatient appropriate because: IV medications/vent support.  CRITICAL CARE Performed by: Vassie Loll   Total critical care time: 60 minutes  Critical care time was exclusive of separately billable procedures and treating other patients.  Critical care was necessary to treat or prevent imminent or life-threatening deterioration.  Critical care was time spent personally by me on the following activities: development of treatment plan with patient and/or surrogate as well as nursing, discussions with consultants, evaluation of patient's response to treatment, examination of patient, obtaining history from patient or surrogate, ordering and performing treatments and interventions, ordering and review of laboratory studies, ordering and review of radiographic studies, pulse oximetry and re-evaluation of patient's condition.      Consultants:  Discussed with hematology Dr. Kirtland Bouchard 12/29 PCCM called overnight 12/29   Procedures:  Intubation 12/30 Central venous line placement 12/30   Antimicrobials:  None    Subjective: Sedated, intubated and mechanically ventilated; during sedation holidays was able to follow simple commands like nodding, blinking and wiggling toes.  Spontaneously able to move 4 limbs.  Patient is currently afebrile.  No nausea, no vomiting, no overt bleeding appreciated.  Signs of fluid overload appreciated on examination.  Objective: Vitals:   05/18/22 0720 05/18/22 0823 05/18/22 0826 05/18/22 0900  BP:      Pulse:      Resp:      Temp: 98.8 F (37.1 C)     TempSrc:      SpO2:  97% 96% 97% 96%  Weight:      Height:    6\' 4"  (1.93 m)    Intake/Output Summary (Last 24 hours) at 05/18/2022 1000 Last data filed at 05/18/2022 0859 Gross per 24 hour  Intake 2554.35 ml  Output 350 ml  Net 2204.35 ml   Filed Weights   05/12/22 2351 05/16/22 0500 05/18/22 0444  Weight: (!) 169.9 kg (!) 169.9 kg (!) 179.2 kg    Examination: General exam: Sedated, intubated and mechanically ventilated; during sedation holiday was able to nod yes and no, blink eyes and wiggle toes on commands.  Currently afebrile. Respiratory system: No wheezing or frank crackles appreciated; decreased breath sounds at the bases. Cardiovascular system: Rate controlled, no rubs, no gallops, unable to assess JVD with body habitus. Gastrointestinal system: Abdomen is obese, nondistended, soft and nontender.  Positive bowel sounds she repeated Central nervous system: Limited examination secondary to sedation; was able to follow simple commands Extremities: No cyanosis or clubbing; 1-2+ edema appreciated bilaterally.  Positive chronic venous stasis dermatitis changes. Skin: No petechiae.;  Right groin and left groin with positive bruises (resolving). Psychiatry: Unable to properly assess with limited examination in the setting of ongoing sedation.  Data Reviewed: I have personally reviewed following labs and imaging studies  CBC: Recent Labs  Lab 05/15/22 0353 05/16/22 0442 05/16/22 1608 05/17/22 0443 05/17/22 1421 05/18/22 0241  WBC 21.4* 11.6* 11.2* 9.3  --  9.0  HGB 8.0* 7.0* 7.1* 6.9* 7.7* 7.2*  HCT 24.1* 20.4* 20.8* 20.4* 22.9* 21.8*  MCV 96.8 91.9 92.4 92.7  --  91.2  PLT 113* 112* 105* 104*  --  87*   Basic Metabolic Panel: Recent Labs  Lab 05/14/22 0434 05/14/22 1450 05/14/22 1956 05/15/22 0043 05/15/22 0353 05/15/22 0812 05/15/22 1240 05/17/22 0443 05/17/22 1421 05/17/22 1925 05/17/22 1926 05/17/22 2201 05/18/22 0241 05/18/22 0648  NA 133* 134*   < > 137 139 139 139 135  --   136  --   --  135  --   K 3.9 4.6   < > 3.6 3.4* 3.7 3.9 6.1*   < > 5.8* 5.6* 5.3* 5.5* 5.3*  CL 99 99   < > 102 105 103 103 100  --  99  --   --  100  --   CO2 20* 15*   < > 18* 19* 22 22 21*  --  22  --   --  23  --   GLUCOSE 434* 579*   < > 379* 301* 244* 194* 352*  --  304*  --   --  297*  --   BUN 20 30*   < > 33* 34* 41* 43* 81*  --  97*  --   --  103*  --   CREATININE 1.82* 3.25*   < > 4.09* 4.17* 4.72* 5.16* 7.79*  --  8.70*  --   --  9.10*  --   CALCIUM 7.3* 6.7*   < > 6.3* 6.4* 6.3* 6.1* 5.7*  --  5.9*  --   --  6.1*  --   MG 2.5* 2.5*  --   --  1.8  --   --   --   --   --   --   --   --   --  PHOS  --   --   --  5.2*  --   --   --   --   --   --   --   --   --   --    < > = values in this interval not displayed.   GFR: Estimated Creatinine Clearance: 14.5 mL/min (A) (by C-G formula based on SCr of 9.1 mg/dL (H)).  CBG: Recent Labs  Lab 05/17/22 1951 05/17/22 2323 05/17/22 2344 05/18/22 0347 05/18/22 0734  GLUCAP 277* 288* 313* 240* 276*   Sepsis Labs: Recent Labs  Lab 05/14/22 2031 05/15/22 0043 05/17/22 1421 05/18/22 0241  PROCALCITON  --   --  24.48 22.55  LATICACIDVEN >9.0* >9.0*  --   --     Recent Results (from the past 240 hour(s))  MRSA Next Gen by PCR, Nasal     Status: None   Collection Time: 05/14/22  6:10 AM   Specimen: Nasal Mucosa; Nasal Swab  Result Value Ref Range Status   MRSA by PCR Next Gen NOT DETECTED NOT DETECTED Final    Comment: (NOTE) The GeneXpert MRSA Assay (FDA approved for NASAL specimens only), is one component of a comprehensive MRSA colonization surveillance program. It is not intended to diagnose MRSA infection nor to guide or monitor treatment for MRSA infections. Test performance is not FDA approved in patients less than 24 years old. Performed at [facility], [address], [place], [place] [address]   Urine Culture     Status: Abnormal   Collection Time: 05/16/22  9:15 AM   Specimen: Urine, Clean Catch  Result Value  Ref Range Status   Specimen Description   Final    URINE, CLEAN CATCH Performed at [facility], [address] [place], [place] [address]    Special Requests   Final    NONE Performed at [facility], [address], [place], [place] [address]    Culture 30,000 COLONIES/mL STAPHYLOCOCCUS HOMINIS (A)  Final   Report Status 05/18/2022 FINAL  Final   Organism ID, Bacteria STAPHYLOCOCCUS HOMINIS (A)  Final      Susceptibility   Staphylococcus hominis - MIC*    CIPROFLOXACIN <=0.5 SENSITIVE Sensitive     GENTAMICIN <=0.5 SENSITIVE Sensitive     NITROFURANTOIN <=16 SENSITIVE Sensitive     OXACILLIN <=0.25 SENSITIVE Sensitive     TETRACYCLINE <=1 SENSITIVE Sensitive     VANCOMYCIN <=0.5 SENSITIVE Sensitive     TRIMETH/SULFA <=10 SENSITIVE Sensitive     CLINDAMYCIN <=0.25 SENSITIVE Sensitive     RIFAMPIN <=0.5 SENSITIVE Sensitive     Inducible Clindamycin NEGATIVE Sensitive     * 30,000 COLONIES/mL STAPHYLOCOCCUS HOMINIS  Culture, blood (Routine X 2) w Reflex to ID Panel     Status: None (Preliminary result)   Collection Time: 05/16/22  9:44 AM   Specimen: BLOOD RIGHT HAND  Result Value Ref Range Status   Specimen Description   Final    BLOOD RIGHT HAND Performed at [facility], [address] [place], [place] [address]    Special Requests   Final    BOTTLES DRAWN AEROBIC AND ANAEROBIC Blood Culture adequate volume Performed at [facility], [address] [place], [place] [address]    Culture  Setup Time   Final    GRAM POSITIVE COCCI AEROBIC BOTTLE Gram Stain Report Called to,Read Back By and Verified With: ROOS,G @ 1226 BY MATTHEWS, B 1.2.24 CRITICAL RESULT CALLED TO, READ BACK BY AND VERIFIED WITH:  RN A COE [id number] @1820  FH Performed at [facility], [address] [place], [place]    Culture GRAM POSITIVE COCCI IN CLUSTERS  Final   Report Status PENDING  Incomplete  Culture, blood (Routine X 2) w Reflex to ID Panel     Status: None (Preliminary result)    Collection Time: 05/16/22  9:44 AM   Specimen: BLOOD  Result Value Ref Range Status   Specimen Description BLOOD BLOOD LEFT HAND  Final   Special Requests   Final    BOTTLES DRAWN AEROBIC AND ANAEROBIC Blood Culture results may not be optimal due to an excessive volume of blood received in culture bottles   Culture   Final    NO GROWTH < 24 HOURS Performed at [facility], [address] [place], [name]    Report Status PENDING  Incomplete  Blood Culture ID Panel (Reflexed)     Status: Abnormal   Collection Time: 05/16/22  9:44 AM  Result Value Ref Range Status   Enterococcus faecalis NOT DETECTED NOT DETECTED Final   Enterococcus Faecium NOT DETECTED NOT DETECTED Final   Listeria monocytogenes NOT DETECTED NOT DETECTED Final   Staphylococcus species DETECTED (A) NOT DETECTED Final    Comment: CRITICAL RESULT CALLED TO, READ BACK BY AND VERIFIED WITH: RN A COE 07/15/22 @1821  FH    Staphylococcus aureus (BCID) NOT DETECTED NOT DETECTED Final   Staphylococcus epidermidis NOT DETECTED NOT DETECTED Final   Staphylococcus lugdunensis NOT DETECTED NOT DETECTED Final   Streptococcus species NOT DETECTED NOT DETECTED Final   Streptococcus agalactiae NOT DETECTED NOT DETECTED Final   Streptococcus pneumoniae NOT DETECTED NOT DETECTED Final   Streptococcus pyogenes NOT DETECTED NOT DETECTED Final   A.calcoaceticus-baumannii NOT DETECTED NOT DETECTED Final   Bacteroides fragilis NOT DETECTED NOT DETECTED Final   Enterobacterales NOT DETECTED NOT DETECTED Final   Enterobacter cloacae complex NOT DETECTED NOT DETECTED Final   Escherichia coli NOT DETECTED NOT DETECTED Final   Klebsiella aerogenes NOT DETECTED NOT DETECTED Final   Klebsiella oxytoca NOT DETECTED NOT DETECTED Final   Klebsiella pneumoniae NOT DETECTED NOT DETECTED Final   Proteus species NOT DETECTED NOT DETECTED Final   Salmonella species NOT DETECTED NOT DETECTED Final   Serratia marcescens NOT DETECTED NOT  DETECTED Final   Haemophilus influenzae NOT DETECTED NOT DETECTED Final   Neisseria meningitidis NOT DETECTED NOT DETECTED Final   Pseudomonas aeruginosa NOT DETECTED NOT DETECTED Final   Stenotrophomonas maltophilia NOT DETECTED NOT DETECTED Final   Candida albicans NOT DETECTED NOT DETECTED Final   Candida auris NOT DETECTED NOT DETECTED Final   Candida glabrata NOT DETECTED NOT DETECTED Final   Candida krusei NOT DETECTED NOT DETECTED Final   Candida parapsilosis NOT DETECTED NOT DETECTED Final   Candida tropicalis NOT DETECTED NOT DETECTED Final   Cryptococcus neoformans/gattii NOT DETECTED NOT DETECTED Final    Comment: Performed at [facility], [address] [place], [address] [place]     Radiology Studies: [place] RENAL  Result Date: 05/18/2022 CLINICAL DATA:  Acute kidney injury. EXAM: RENAL / URINARY TRACT ULTRASOUND COMPLETE COMPARISON:  CT abdomen and pelvis 11/08/2021 FINDINGS: The examination was severely limited by large body habitus, bowel gas, and patient sedation which limited mobility. Right Kidney: The right kidney could not be visualized, with note made of difficulty penetrating the liver. Left Kidney: Renal measurements: 12.3 x 7.8 x 8.3 cm = volume: 412 mL. No hydronephrosis or gross mass. Bladder:  Decompressed.  Foley catheter in place. Other: None. IMPRESSION: 1. Severely limited examination with inability to visualize the right kidney. 2. No left hydronephrosis. Electronically Signed   By: Sebastian Ache M.D.   On: 05/18/2022 09:39   US Venous Img Lower Bilateral (DVT)  Result Date: 05/17/2022 CLINICAL DATA:  Follow-up lower extremity DVT. EXAM: BILATERAL LOWER EXTREMITY VENOUS DOPPLER ULTRASOUND TECHNIQUE: Gray-scale sonography with graded compression, as well as color Doppler and duplex ultrasound were performed to evaluate the lower extremity deep venous systems from the level of the common femoral vein and including the common femoral, femoral, profunda femoral,  popliteal and calf veins including the posterior tibial, peroneal and gastrocnemius veins when visible. The superficial great saphenous vein was also interrogated. Spectral Doppler was utilized to evaluate flow at rest and with distal augmentation maneuvers in the common femoral, femoral and popliteal veins. COMPARISON:  Bilateral lower extremity venous Doppler ultrasound - 05/13/2022 (positive for occlusive DVT extending from the left common femoral vein through the left tibial veins; positive for chronic nonocclusive SVT involving the right greater saphenous vein) Left lower extremity venous Doppler ultrasound - 01/12/2012 (positive for nonocclusive chronic DVT involving the left popliteal vein). FINDINGS: Examination is degraded due to patient body habitus and poor sonographic window. RIGHT LOWER EXTREMITY Common Femoral Vein: No evidence of thrombus. Normal compressibility, respiratory phasicity and response to augmentation. Saphenofemoral Junction: Grossly unchanged near occlusive thrombus involving the right saphenofemoral junction (images 7 and 8). Profunda Femoral Vein: Evidence of thrombus. Normal compressibility, respiratory phasicity and response to augmentation. Femoral Vein: Nonocclusive wall thickening/chronic DVT involving the mid (image 16) and distal (image 20) aspects of the right femoral vein. Popliteal Vein: There is mixed echogenic nonocclusive DVT involving the right popliteal vein (image 23). Calf Veins: Appear patent where visualized. Superficial Great Saphenous Vein: Grossly unchanged chronic mixed echogenic nonocclusive thrombus involving the greater saphenous vein at the level of the distal thigh (image 34), knee (image 31 and 32) and calf (image 30) Other Findings:  None. LEFT LOWER EXTREMITY Common Femoral Vein: Restored patency of the left common femoral vein without evidence of acute or chronic DVT. Saphenofemoral Junction: No evidence of thrombus. Normal compressibility and flow on  color Doppler imaging. Profunda Femoral Vein: Restored patency without evidence of acute or chronic DVT. Femoral Vein: Redemonstrated near occlusive DVT involving the proximal (image 59), mid (image 62) and distal (image 66) aspects of the left femoral vein Popliteal Vein: Redemonstrated nonocclusive wall thickening involving the left popliteal vein (image 70), unchanged. Calf Veins: Redemonstrated occlusive DVT involving both the left posterior tibial and peroneal veins (image 87), unchanged. Superficial Great Saphenous Vein: Redemonstrated chronic occlusive superficial thrombophlebitis involving the greater saphenous vein from the level of the distal thigh through the knee, unchanged. Other Findings:  None. IMPRESSION: 1. Significantly degraded examination secondary to patient body habitus and poor sonographic window. 2. Overall improved left lower extremity clot burden with residual DVT involving the left femoral and tibial veins. No definitive evidence of worsening DVT within the left lower extremity. 3. 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 This was most likely present on most recent bilateral lower extremity venous Doppler ultrasound performed 05/13/2022 though suboptimally visualized on that examination secondary to poor sonographic window. 4. Chronic nonocclusive superficial thrombophlebitis involving the bilateral greater saphenous veins. Electronically Signed   By: Simonne Come M.D.   On: 05/17/2022 15:05   DG CHEST PORT 1  VIEW  Result Date: 05/17/2022 CLINICAL DATA:  Shortness of breath. EXAM: PORTABLE CHEST 1 VIEW COMPARISON:  May 14, 2022. FINDINGS: Stable cardiomegaly. Mild central pulmonary vascular congestion is noted. Endotracheal and nasogastric tubes appear to be in grossly good position. Right basilar atelectasis is noted with associated effusion. Bony thorax is unremarkable. IMPRESSION: Cardiomegaly  with central pulmonary vascular congestion. Stable support apparatus. Right basilar opacity as described above. Electronically Signed   By: Marijo Conception M.D.   On: 05/17/2022 08:21    Scheduled Meds:  sodium chloride   Intravenous Once   budesonide (PULMICORT) nebulizer solution  0.5 mg Nebulization BID   Chlorhexidine Gluconate Cloth  6 each Topical Daily   dextrose  1 ampule Intravenous Once   docusate  100 mg Per Tube BID   hydrocortisone sod succinate (SOLU-CORTEF) inj  100 mg Intravenous Q8H   insulin aspart  0-9 Units Subcutaneous Q4H   insulin detemir  20 Units Subcutaneous BID   ipratropium-albuterol  3 mL Nebulization QID   levothyroxine  200 mcg Per Tube QAC breakfast   mouth rinse  15 mL Mouth Rinse Q2H   pantoprazole (PROTONIX) IV  40 mg Intravenous Q12H   polyethylene glycol  17 g Per Tube Daily   Continuous Infusions:  sodium chloride Stopped (05/14/22 2224)   ampicillin-sulbactam (UNASYN) IV 3 g (05/18/22 0946)   dexmedetomidine (PRECEDEX) IV infusion 0.6 mcg/kg/hr (05/18/22 0859)   fentaNYL infusion INTRAVENOUS 125 mcg/hr (05/18/22 0859)   furosemide     norepinephrine (LEVOPHED) Adult infusion 4 mcg/min (05/18/22 0859)   vasopressin 0.04 Units/min (05/18/22 0859)     LOS: 4 days   Barton Dubois, MD [facility]  If 7PM-7AM, please contact night-coverage [email or web address] 05/18/2022, 10:00 AM

## 2022-05-18 NOTE — Consult Note (Signed)
[facility] Renal Consultation Note  Requesting MD: Dyann Kief Indication for Consultation: AKI  HPI:  Frank Shelton. is a 62 y.o. male with past medical history significant for obesity, T2DM, HTN, gout, COPD, hyperlipidemia.  He also has a history of previous DVT/PE on xarelto. He presented to medical attention on 12/28 with c/o SOB and CP after missing 3 doses of xarelto-  found to have PE's throughout the right lung-  treated with heparin initially, then noted to have a cardiac arrest on 12/30- treated with alteplase.  Has continued to not do well in ICU-  requiring pressor and vent support.  There is no history of CKD-  crt on 12/29 was 0.98 but renal function has worsened since arrest and pressor support -  crt up to 9 today -  CCM saw yesterday and felt that pt was approaching need for CRRT but no beds available in ICU at [facility]-  other labs of note this AM-  K of 5.3, bicarb 23, BUN 103, hgb 7.2-  there is some UOP 390 last 24 hours  Creat  Date/Time Value Ref Range Status  01/10/2017 10:07 AM 0.84 0.70 - 1.33 mg/dL Final    Comment:      For patients > or = 64 years of age: The upper reference limit for Creatinine is approximately 13% higher for people identified as African-American.      Creatinine, Ser  Date/Time Value Ref Range Status  05/18/2022 02:41 AM 9.10 (H) 0.61 - 1.24 mg/dL Final  05/17/2022 07:25 PM 8.70 (H) 0.61 - 1.24 mg/dL Final  05/17/2022 04:43 AM 7.79 (H) 0.61 - 1.24 mg/dL Final    Comment:    DELTA CHECK NOTED  05/15/2022 12:40 PM 5.16 (H) 0.61 - 1.24 mg/dL Final  05/15/2022 08:12 AM 4.72 (H) 0.61 - 1.24 mg/dL Final  05/15/2022 03:53 AM 4.17 (H) 0.61 - 1.24 mg/dL Final  05/15/2022 12:43 AM 4.09 (H) 0.61 - 1.24 mg/dL Final  05/14/2022 07:56 PM 3.84 (H) 0.61 - 1.24 mg/dL Final  05/14/2022 02:50 PM 3.25 (H) 0.61 - 1.24 mg/dL Final    Comment:    DELTA CHECK NOTED  05/14/2022 04:34 AM 1.82 (H) 0.61 - 1.24 mg/dL Final  05/13/2022 04:57 AM 0.98  0.61 - 1.24 mg/dL Final  05/12/2022 12:00 PM 1.06 0.61 - 1.24 mg/dL Final  11/08/2021 09:10 PM 1.00 0.61 - 1.24 mg/dL Final  05/17/2020 05:13 AM 0.94 0.61 - 1.24 mg/dL Final  05/16/2020 03:07 PM 1.03 0.61 - 1.24 mg/dL Final  04/27/2017 03:39 PM 0.90 0.76 - 1.27 mg/dL Final  03/11/2017 07:10 AM 0.85 0.61 - 1.24 mg/dL Final  03/10/2017 02:58 AM 0.90 0.61 - 1.24 mg/dL Final  03/07/2017 10:43 AM 0.90 0.61 - 1.24 mg/dL Final  02/02/2017 10:10 AM 0.80 0.61 - 1.24 mg/dL Final  01/10/2014 04:43 AM 0.80 0.50 - 1.35 mg/dL Final  01/09/2014 04:35 PM 0.93 0.50 - 1.35 mg/dL Final  01/14/2012 05:08 AM 0.81 0.50 - 1.35 mg/dL Final  01/13/2012 05:03 AM 0.81 0.50 - 1.35 mg/dL Final  01/11/2012 09:25 PM 0.96 0.50 - 1.35 mg/dL Final  06/03/2010 03:37 AM 1.00 0.4 - 1.5 mg/dL Final  06/01/2010 09:20 AM 0.90 0.4 - 1.5 mg/dL Final  02/02/2010 07:48 AM 0.90 0.4 - 1.5 mg/dL Final  01/27/2010 10:45 AM 0.89 0.4 - 1.5 mg/dL Final     PMHx:   Past Medical History:  Diagnosis Date   Arthritis    L shoulder & fingers    Bronchitis  during enviromental changes    Complication of anesthesia    Report of airway problem   Diabetes mellitus without complication ([place])    Dyspnea    Embolism - blood clot    Gout    Headache    uses ibuprofen & tylenol & its relieved    Hypertension    Hypothyroidism    Myocardial infarction ([place]) 2012   OSA on CPAP    Peripheral vascular disease ([place])    Pulmonary embolism ([place])    Stroke ([place]) 2014    Past Surgical History:  Procedure Laterality Date   HELLER MYOTOMY  2014   [place]   IVC FILTER INSERTION  2010   ROTATOR CUFF REPAIR Right    SKIN GRAFT     x3- to face., child    THYROIDECTOMY N/A 03/09/2017   Procedure: TOTAL THYROIDECTOMY;  Surgeon: Armandina Gemma, MD;  Location: [place];  Service: General;  Laterality: N/A;   TOTAL THYROIDECTOMY  03/09/2017    Family Hx:  Family History  Problem Relation Age of Onset   Heart attack Sister 36       Two  stents   Heart attack Mother    Heart attack Maternal Grandmother    Heart attack Maternal Grandfather     Social History:  reports that he quit smoking about 16 years ago. His smoking use included cigarettes. He started smoking about 52 years ago. He has a 30.00 pack-year smoking history. He has quit using smokeless tobacco. He reports that he does not currently use alcohol. He reports that he does not use drugs.  Allergies:  Allergies  Allergen Reactions   Nabumetone Hives    Medications: Prior to Admission medications   Medication Sig Start Date End Date Taking? Authorizing Provider  acetaminophen (TYLENOL) 325 MG tablet Take 650 mg by mouth every 6 (six) hours as needed for moderate pain or headache.   Yes [provider]  albuterol (VENTOLIN HFA) 108 (90 Base) MCG/ACT inhaler Inhale 2 puffs into the lungs every 4 (four) hours as needed for wheezing or shortness of breath. 05/18/20  Yes Shah, Pratik D, DO  allopurinol (ZYLOPRIM) 300 MG tablet Take 300 mg by mouth daily.   Yes [provider]  aspirin EC 81 MG tablet Take 81 mg by mouth daily. Swallow whole.   Yes [provider]  atorvastatin (LIPITOR) 40 MG tablet Take 40 mg by mouth daily.   Yes [provider]  budesonide-formoterol (SYMBICORT) 160-4.5 MCG/ACT inhaler Inhale 2 puffs into the lungs 2 (two) times daily as needed (for shortness of breath).    Yes [provider]  Cholecalciferol (VITAMIN D3) 5000 units CAPS Take 5,000 Units by mouth 2 (two) times daily.   Yes [provider]  colchicine 0.6 MG tablet Take 0.6 mg by mouth daily as needed (for gout flare/pain).    Yes [provider]  Cyanocobalamin (B-12) 5000 MCG CAPS Take 5,000 mcg by mouth daily.    Yes [provider]  furosemide (LASIX) 20 MG tablet Take 20 mg by mouth daily as needed.    Yes [provider]  levothyroxine (SYNTHROID, LEVOTHROID) 200 MCG tablet TAKE 1 TABLET EVERY  DAY Patient taking differently: Take 200 mcg by mouth daily before breakfast. 09/28/17  Yes Nida, Marella Chimes, MD  Multiple Vitamin (MULTIVITAMIN WITH MINERALS) TABS tablet Take 1 tablet by mouth daily.   Yes [provider]  vitamin C (ASCORBIC ACID) 500 MG tablet Take 500 mg by  mouth daily.   Yes [provider]  XARELTO 20 MG TABS tablet TAKE 1 TABLET EVERY DAY Patient taking differently: Take 20 mg by mouth daily with supper. 10/10/18  Yes Herminio Commons, MD  calcium carbonate (TUMS) 500 MG chewable tablet Chew 2 tablets (400 mg of elemental calcium total) by mouth 4 (four) times daily. Patient not taking: Reported on 05/12/2022 03/11/17   Armandina Gemma, MD  calcium-vitamin D (OSCAL 500/200 D-3) 500-200 MG-UNIT tablet Take 1 tablet by mouth 2 (two) times daily. Patient not taking: Reported on 05/12/2022 02/02/17   Aviva Signs, MD  isosorbide mononitrate (IMDUR) 30 MG 24 hr tablet Take 30 mg by mouth every day Patient not taking: Reported on 05/12/2022 12/07/17   Herminio Commons, MD  methocarbamol (ROBAXIN) 500 MG tablet Take 1 tablet (500 mg total) by mouth 2 (two) times daily as needed for muscle spasms. Patient not taking: Reported on 05/12/2022 11/08/21   Noemi Chapel, MD    I have reviewed the patient's current medications.  Labs:  Results for orders placed or performed during the hospital encounter of 05/12/22 (from the past 48 hour(s))  Urine Culture     Status: Abnormal   Collection Time: 05/16/22  9:15 AM   Specimen: Urine, Clean Catch  Result Value Ref Range   Specimen Description      URINE, CLEAN CATCH Performed at [facility], [address], [place], [place] [address]    Special Requests      NONE Performed at [facility], [address] [place], [place] [address]    Culture 30,000 COLONIES/mL STAPHYLOCOCCUS HOMINIS (A)    Report Status 05/18/2022 FINAL    Organism ID, Bacteria STAPHYLOCOCCUS HOMINIS (A)       Susceptibility    Staphylococcus hominis - MIC*    CIPROFLOXACIN <=0.5 SENSITIVE Sensitive     GENTAMICIN <=0.5 SENSITIVE Sensitive     NITROFURANTOIN <=16 SENSITIVE Sensitive     OXACILLIN <=0.25 SENSITIVE Sensitive     TETRACYCLINE <=1 SENSITIVE Sensitive     VANCOMYCIN <=0.5 SENSITIVE Sensitive     TRIMETH/SULFA <=10 SENSITIVE Sensitive     CLINDAMYCIN <=0.25 SENSITIVE Sensitive     RIFAMPIN <=0.5 SENSITIVE Sensitive     Inducible Clindamycin NEGATIVE Sensitive     * 30,000 COLONIES/mL STAPHYLOCOCCUS HOMINIS  Culture, blood (Routine X 2) w Reflex to ID Panel     Status: None (Preliminary result)   Collection Time: 05/16/22  9:44 AM   Specimen: BLOOD RIGHT HAND  Result Value Ref Range   Specimen Description      BLOOD RIGHT HAND Performed at [facility], [address], [place], [place] [address]    Special Requests      BOTTLES DRAWN AEROBIC AND ANAEROBIC Blood Culture adequate volume Performed at [facility], [address] [place], [place] [address]    Culture  Setup Time      GRAM POSITIVE COCCI AEROBIC BOTTLE Gram Stain Report Called to,Read Back By and Verified With: ROOS,G @ 1226 BY MATTHEWS, B 1.2.24 CRITICAL RESULT CALLED TO, READ BACK BY AND VERIFIED WITH: RN A COE [id number] @1820  FH Performed at [facility], [place] [address], [place], [place] [address]    Culture GRAM POSITIVE COCCI IN CLUSTERS    Report Status PENDING   Culture, blood (Routine X 2) w Reflex to ID Panel     Status: None (Preliminary result)   Collection Time: 05/16/22  9:44 AM   Specimen: BLOOD  Result Value Ref Range   Specimen Description BLOOD BLOOD LEFT HAND    Special Requests      BOTTLES DRAWN AEROBIC AND ANAEROBIC Blood Culture results may not be optimal due to an excessive volume of blood received in culture bottles   Culture      NO GROWTH < 24 HOURS Performed at [facility], [address] [place], [place] [address]    Report Status PENDING   Prepare RBC (crossmatch)     Status: None    Collection Time: 05/16/22  9:44 AM  Result Value Ref Range   Order Confirmation      ORDER PROCESSED BY BLOOD BANK Performed at [facility], [address], [place], [place] [address]   Albumin     Status: Abnormal   Collection Time: 05/16/22  9:44 AM  Result Value Ref Range   Albumin 2.3 (L) 3.5 - 5.0 g/dL    Comment: Performed at [facility], [address] [place], [place] [address]  Blood Culture ID Panel (Reflexed)     Status: Abnormal   Collection Time: 05/16/22  9:44 AM  Result Value Ref Range   Enterococcus faecalis NOT DETECTED NOT DETECTED   Enterococcus Faecium NOT DETECTED NOT DETECTED   Listeria monocytogenes NOT DETECTED NOT DETECTED   Staphylococcus species DETECTED (A) NOT DETECTED    Comment: CRITICAL RESULT CALLED TO, READ BACK BY AND VERIFIED WITH: RN A COE [id number] @1821  FH    Staphylococcus aureus (BCID) NOT DETECTED NOT DETECTED   Staphylococcus epidermidis NOT DETECTED NOT DETECTED   Staphylococcus lugdunensis NOT DETECTED NOT DETECTED   Streptococcus species NOT DETECTED NOT DETECTED   Streptococcus agalactiae NOT DETECTED NOT DETECTED   Streptococcus pneumoniae NOT DETECTED NOT DETECTED   Streptococcus pyogenes NOT DETECTED NOT DETECTED   A.calcoaceticus-baumannii NOT DETECTED NOT DETECTED   Bacteroides fragilis NOT DETECTED NOT DETECTED   Enterobacterales NOT DETECTED NOT DETECTED   Enterobacter cloacae complex NOT DETECTED NOT DETECTED   Escherichia coli NOT DETECTED NOT DETECTED   Klebsiella aerogenes NOT DETECTED NOT DETECTED   Klebsiella oxytoca NOT DETECTED NOT DETECTED   Klebsiella pneumoniae NOT DETECTED NOT DETECTED   Proteus species NOT DETECTED NOT DETECTED   Salmonella species NOT DETECTED NOT DETECTED   Serratia marcescens NOT DETECTED NOT DETECTED   Haemophilus influenzae NOT DETECTED NOT DETECTED   Neisseria meningitidis NOT DETECTED NOT DETECTED   Pseudomonas aeruginosa NOT DETECTED NOT DETECTED   Stenotrophomonas maltophilia NOT  DETECTED NOT DETECTED   Candida albicans NOT DETECTED NOT DETECTED   Candida auris NOT DETECTED NOT DETECTED   Candida glabrata NOT DETECTED NOT DETECTED   Candida krusei NOT DETECTED NOT DETECTED   Candida parapsilosis NOT DETECTED NOT DETECTED   Candida tropicalis NOT DETECTED NOT DETECTED   Cryptococcus neoformans/gattii NOT DETECTED NOT DETECTED    Comment: Performed at [facility], [address] [place], [place] [address]  Glucose, capillary     Status: Abnormal   Collection Time: 05/16/22 11:47 AM  Result Value Ref Range   Glucose-Capillary 285 (H) 70 - 99 mg/dL    Comment: Glucose reference range applies only to samples taken after fasting for at least 8 hours.  Glucose, capillary     Status: Abnormal   Collection Time: 05/16/22  3:37 PM  Result Value Ref Range   Glucose-Capillary 348 (H) 70 - 99 mg/dL    Comment: Glucose reference range applies only to samples taken after fasting for at least 8 hours.  CBC  Status: Abnormal   Collection Time: 05/16/22  4:08 PM  Result Value Ref Range   WBC 11.2 (H) 4.0 - 10.5 K/uL   RBC 2.25 (L) 4.22 - 5.81 MIL/uL   Hemoglobin 7.1 (L) 13.0 - 17.0 g/dL   HCT 20.8 (L) 39.0 - 52.0 %   MCV 92.4 80.0 - 100.0 fL   MCH 31.6 26.0 - 34.0 pg   MCHC 34.1 30.0 - 36.0 g/dL   RDW 17.7 (H) 11.5 - 15.5 %   Platelets 105 (L) 150 - 400 K/uL    Comment: Immature Platelet Fraction may be clinically indicated, consider ordering this additional test [id number]    nRBC 0.4 (H) 0.0 - 0.2 %    Comment: Performed at [facility], [address], [place], [place] [address]  Glucose, capillary     Status: Abnormal   Collection Time: 05/16/22  7:48 PM  Result Value Ref Range   Glucose-Capillary 357 (H) 70 - 99 mg/dL    Comment: Glucose reference range applies only to samples taken after fasting for at least 8 hours.  Glucose, capillary     Status: Abnormal   Collection Time: 05/16/22 11:11 PM  Result Value Ref Range   Glucose-Capillary 338 (H) 70 -  99 mg/dL    Comment: Glucose reference range applies only to samples taken after fasting for at least 8 hours.  Glucose, capillary     Status: Abnormal   Collection Time: 05/17/22  4:42 AM  Result Value Ref Range   Glucose-Capillary 299 (H) 70 - 99 mg/dL    Comment: Glucose reference range applies only to samples taken after fasting for at least 8 hours.  CBC     Status: Abnormal   Collection Time: 05/17/22  4:43 AM  Result Value Ref Range   WBC 9.3 4.0 - 10.5 K/uL   RBC 2.20 (L) 4.22 - 5.81 MIL/uL   Hemoglobin 6.9 (LL) 13.0 - 17.0 g/dL    Comment: This critical result has verified and been called to Redbird Smith by Eula Listen on 01 02 2024 at 0519, and has been read back.    HCT 20.4 (L) 39.0 - 52.0 %   MCV 92.7 80.0 - 100.0 fL   MCH 31.4 26.0 - 34.0 pg   MCHC 33.8 30.0 - 36.0 g/dL   RDW 18.1 (H) 11.5 - 15.5 %   Platelets 104 (L) 150 - 400 K/uL    Comment: Immature Platelet Fraction may be clinically indicated, consider ordering this additional test [id number]    nRBC 1.5 (H) 0.0 - 0.2 %    Comment: Performed at [facility], [address] [place], [place] [id number]  Basic metabolic panel     Status: Abnormal   Collection Time: 05/17/22  4:43 AM  Result Value Ref Range   Sodium 135 135 - 145 mmol/L   Potassium 6.1 (H) 3.5 - 5.1 mmol/L    Comment: DELTA CHECK NOTED   Chloride 100 98 - 111 mmol/L   CO2 21 (L) 22 - 32 mmol/L   Glucose, Bld 352 (H) 70 - 99 mg/dL    Comment: Glucose reference range applies only to samples taken after fasting for at least 8 hours.   BUN 81 (H) 8 - 23 mg/dL   Creatinine, Ser 7.79 (H) 0.61 - 1.24 mg/dL    Comment: DELTA CHECK NOTED   Calcium 5.7 (LL) 8.9 - 10.3 mg/dL    Comment: CRITICAL RESULT CALLED TO, READ BACK BY AND VERIFIED WITH: JOBE,H AT 5:45AM  ON 05/17/22 BY FESTERMAN,C    GFR, Estimated 7 (L) >60 mL/min    Comment: (NOTE) Calculated using the CKD-EPI Creatinine Equation (2021)    Anion gap 14 5 - 15    Comment: Performed at  [facility], [address] [place], [place] [address]  Type and screen [facility]     Status: None   Collection Time: 05/17/22  6:36 AM  Result Value Ref Range   ABO/RH(D) A POS    Antibody Screen NEG    Sample Expiration      05/20/2022,2359 Performed at [facility], [address], [place], [place] [address]   Prepare RBC (crossmatch)     Status: None   Collection Time: 05/17/22  6:38 AM  Result Value Ref Range   Order Confirmation      ORDER PROCESSED BY BLOOD BANK Performed at [facility], [address], [place], [place] [address]   Glucose, capillary     Status: Abnormal   Collection Time: 05/17/22  7:39 AM  Result Value Ref Range   Glucose-Capillary 296 (H) 70 - 99 mg/dL    Comment: Glucose reference range applies only to samples taken after fasting for at least 8 hours.  Glucose, capillary     Status: Abnormal   Collection Time: 05/17/22 10:18 AM  Result Value Ref Range   Glucose-Capillary 283 (H) 70 - 99 mg/dL    Comment: Glucose reference range applies only to samples taken after fasting for at least 8 hours.  Potassium     Status: Abnormal   Collection Time: 05/17/22  2:21 PM  Result Value Ref Range   Potassium 6.5 (HH) 3.5 - 5.1 mmol/L    Comment: CRITICAL RESULT CALLED TO, READ BACK BY AND VERIFIED WITH: KING J. AT 1540 ON [id number] BY THOMPSON S. Performed at [facility], [address], [place], [place] [address]   Hemoglobin and hematocrit, blood     Status: Abnormal   Collection Time: 05/17/22  2:21 PM  Result Value Ref Range   Hemoglobin 7.7 (L) 13.0 - 17.0 g/dL   HCT 51.0 (L) 25.8 - 52.7 %    Comment: Performed at [facility], [address] [place], [place] [address]  Procalcitonin - Baseline     Status: None   Collection Time: 05/17/22  2:21 PM  Result Value Ref Range   Procalcitonin 24.48 ng/mL    Comment:        Interpretation: PCT >= 10 ng/mL: Important systemic inflammatory response, almost exclusively due to severe bacterial sepsis  or septic shock. (NOTE)       Sepsis PCT Algorithm           Lower Respiratory Tract                                      Infection PCT Algorithm    ----------------------------     ----------------------------         PCT < 0.25 ng/mL                PCT < 0.10 ng/mL          Strongly encourage             Strongly discourage   discontinuation of antibiotics    initiation of antibiotics    ----------------------------     -----------------------------       PCT 0.25 - 0.50 ng/mL  PCT 0.10 - 0.25 ng/mL               OR       >80% decrease in PCT            Discourage initiation of                                            antibiotics      Encourage discontinuation           of antibiotics    ----------------------------     -----------------------------         PCT >= 0.50 ng/mL              PCT 0.26 - 0.50 ng/mL                AND       <80% decrease in PCT             Encourage initiation of                                             antibiotics       Encourage continuation           of antibiotics    ----------------------------     -----------------------------        PCT >= 0.50 ng/mL                  PCT > 0.50 ng/mL               AND         increase in PCT                  Strongly encourage                                      initiation of antibiotics    Strongly encourage escalation           of antibiotics                                     -----------------------------                                           PCT <= 0.25 ng/mL                                                 OR                                        > 80% decrease in PCT  Discontinue / Do not initiate                                             antibiotics  Performed at [facility], [address] [place], [place] [address]   Sodium, urine, random     Status: None   Collection Time: 05/17/22  2:30 PM  Result Value Ref Range   Sodium, Ur 33 mmol/L     Comment: Performed at [facility], [address] [place], [place] [address]  Creatinine, urine, random     Status: None   Collection Time: 05/17/22  2:30 PM  Result Value Ref Range   Creatinine, Urine 151.03 mg/dL    Comment: Performed at [facility], [address], [place], [place] [address]  Glucose, capillary     Status: Abnormal   Collection Time: 05/17/22  3:55 PM  Result Value Ref Range   Glucose-Capillary 306 (H) 70 - 99 mg/dL    Comment: Glucose reference range applies only to samples taken after fasting for at least 8 hours.  Blood gas, arterial     Status: Abnormal   Collection Time: 05/17/22  4:54 PM  Result Value Ref Range   FIO2 40.00 %   pH, Arterial 7.39 7.35 - 7.45   pCO2 arterial 35 32 - 48 mmHg   pO2, Arterial 68 (L) 83 - 108 mmHg   Bicarbonate 21.2 20.0 - 28.0 mmol/L   Acid-base deficit 3.4 (H) 0.0 - 2.0 mmol/L   O2 Saturation 95.5 %   Patient temperature 37.0    Collection site RIGHT RADIAL    Drawn by [id number]    Allens test (pass/fail) PASS PASS    Comment: Performed at [facility], [address], [place], [place] [address]  Basic metabolic panel     Status: Abnormal   Collection Time: 05/17/22  7:25 PM  Result Value Ref Range   Sodium 136 135 - 145 mmol/L   Potassium 5.8 (H) 3.5 - 5.1 mmol/L   Chloride 99 98 - 111 mmol/L   CO2 22 22 - 32 mmol/L   Glucose, Bld 304 (H) 70 - 99 mg/dL    Comment: Glucose reference range applies only to samples taken after fasting for at least 8 hours.   BUN 97 (H) 8 - 23 mg/dL   Creatinine, Ser 8.70 (H) 0.61 - 1.24 mg/dL   Calcium 5.9 (LL) 8.9 - 10.3 mg/dL    Comment: CRITICAL RESULT CALLED TO, READ BACK BY AND VERIFIED WITH: J.ELLER AT 2028 ON 01.02.24 BY ADGER J     GFR, Estimated 6 (L) >60 mL/min    Comment: (NOTE) Calculated using the CKD-EPI Creatinine Equation (2021)    Anion gap 15 5 - 15    Comment: Performed at [facility], [address] [place], [place] [address]  Potassium     Status: Abnormal    Collection Time: 05/17/22  7:26 PM  Result Value Ref Range   Potassium 5.6 (H) 3.5 - 5.1 mmol/L    Comment: Performed at [facility], [address], [place], [place] [address]  Glucose, capillary     Status: Abnormal   Collection Time: 05/17/22  7:51 PM  Result Value Ref Range   Glucose-Capillary 277 (H) 70 - 99 mg/dL    Comment: Glucose reference range applies only to samples taken after fasting for at least 8 hours.  Potassium     Status: Abnormal   Collection Time: 05/17/22 10:01 PM  Result Value Ref Range   Potassium 5.3 (H) 3.5 - 5.1 mmol/L    Comment: Performed at [facility], [address], [place], [place] [address]  Glucose, capillary     Status: Abnormal   Collection Time: 05/17/22 11:23 PM  Result Value Ref Range   Glucose-Capillary 288 (H) 70 - 99 mg/dL    Comment: Glucose reference range applies only to samples taken after fasting for at least 8 hours.  Glucose, capillary     Status: Abnormal   Collection Time: 05/17/22 11:44 PM  Result Value Ref Range   Glucose-Capillary 313 (H) 70 - 99 mg/dL    Comment: Glucose reference range applies only to samples taken after fasting for at least 8 hours.  Basic metabolic panel     Status: Abnormal   Collection Time: 05/18/22  2:41 AM  Result Value Ref Range   Sodium 135 135 - 145 mmol/L   Potassium 5.5 (H) 3.5 - 5.1 mmol/L   Chloride 100 98 - 111 mmol/L   CO2 23 22 - 32 mmol/L   Glucose, Bld 297 (H) 70 - 99 mg/dL    Comment: Glucose reference range applies only to samples taken after fasting for at least 8 hours.   BUN 103 (H) 8 - 23 mg/dL    Comment: RESULTS CONFIRMED BY MANUAL DILUTION   Creatinine, Ser 9.10 (H) 0.61 - 1.24 mg/dL   Calcium 6.1 (LL) 8.9 - 10.3 mg/dL    Comment: CRITICAL RESULT CALLED TO, READ BACK BY AND VERIFIED WITH: J. ELLER AT [id number] ON 01.03.24 BY ADGER J     GFR, Estimated 6 (L) >60 mL/min    Comment: (NOTE) Calculated using the CKD-EPI Creatinine Equation (2021)    Anion gap 12 5 - 15     Comment: Performed at [facility], [address], [place], [place] [address]  CBC     Status: Abnormal   Collection Time: 05/18/22  2:41 AM  Result Value Ref Range   WBC 9.0 4.0 - 10.5 K/uL   RBC 2.39 (L) 4.22 - 5.81 MIL/uL   Hemoglobin 7.2 (L) 13.0 - 17.0 g/dL   HCT 21.8 (L) 39.0 - 52.0 %   MCV 91.2 80.0 - 100.0 fL   MCH 30.1 26.0 - 34.0 pg   MCHC 33.0 30.0 - 36.0 g/dL   RDW 18.9 (H) 11.5 - 15.5 %   Platelets 87 (L) 150 - 400 K/uL    Comment: Immature Platelet Fraction may be clinically indicated, consider ordering this additional test [id number]    nRBC 9.1 (H) 0.0 - 0.2 %    Comment: Performed at [facility], [address] [place],  [address]  Procalcitonin     Status: None   Collection Time: 05/18/22  2:41 AM  Result Value Ref Range   Procalcitonin 22.55 ng/mL    Comment:        Interpretation: PCT >= 10 ng/mL: Important systemic inflammatory response, almost exclusively due to severe bacterial sepsis or septic shock. (NOTE)       Sepsis PCT Algorithm           Lower Respiratory Tract                                      Infection PCT Algorithm    ----------------------------     ----------------------------  PCT < 0.25 ng/mL                PCT < 0.10 ng/mL          Strongly encourage             Strongly discourage   discontinuation of antibiotics    initiation of antibiotics    ----------------------------     -----------------------------       PCT 0.25 - 0.50 ng/mL            PCT 0.10 - 0.25 ng/mL               OR       >80% decrease in PCT            Discourage initiation of                                            antibiotics      Encourage discontinuation           of antibiotics    ----------------------------     -----------------------------         PCT >= 0.50 ng/mL              PCT 0.26 - 0.50 ng/mL                AND       <80% decrease in PCT             Encourage initiation of                                              antibiotics       Encourage continuation           of antibiotics    ----------------------------     -----------------------------        PCT >= 0.50 ng/mL                  PCT > 0.50 ng/mL               AND         increase in PCT                  Strongly encourage                                      initiation of antibiotics    Strongly encourage escalation           of antibiotics                                     -----------------------------                                           PCT <= 0.25 ng/mL  OR                                        > 80% decrease in PCT                                      Discontinue / Do not initiate                                             antibiotics  Performed at [facility], [address] [place], [place] [address]   Glucose, capillary     Status: Abnormal   Collection Time: 05/18/22  3:47 AM  Result Value Ref Range   Glucose-Capillary 240 (H) 70 - 99 mg/dL    Comment: Glucose reference range applies only to samples taken after fasting for at least 8 hours.  Potassium     Status: Abnormal   Collection Time: 05/18/22  6:48 AM  Result Value Ref Range   Potassium 5.3 (H) 3.5 - 5.1 mmol/L    Comment: Performed at [facility], [address] [place], [place] [address]  Glucose, capillary     Status: Abnormal   Collection Time: 05/18/22  7:34 AM  Result Value Ref Range   Glucose-Capillary 276 (H) 70 - 99 mg/dL    Comment: Glucose reference range applies only to samples taken after fasting for at least 8 hours.     ROS:  Review of systems not obtained due to patient factors.  Physical Exam: Vitals:   05/18/22 0710 05/18/22 0720  BP: (!) 93/49   Pulse: 68   Resp: (!) 0   Temp:  98.8 F (37.1 C)  SpO2: 96% 97%     General: obese, sedated on vent HEENT: PERRLA, mucous membranes moist  Neck: difficult to tell JVD Heart: RRR Lungs: CBS bilat Abdomen: obese,  distended Extremities: pitting edema -  skin discoloration to distal LEs Skin: warm and dry Neuro: sedated  Assessment/Plan: 62 year old with DM, HTN, obesity and history of DVT/PEs-  now presents with large recurrent PEs and complicated hospital course to include cardiac arrest and continued hemodynamic instability -   now with AKI 1.Renal- baseline crt less than 1.  Now has developed AKI in the setting of recurrent PE's/cardiac arrest and continued hemodynamic instability req pressors.   Suspect ATN-  but U/A and renal ultrasound are ordered and pending.  He is making some urine, 390 last 24 hours.  I agree is at a place where RRT would be beneficial but not able to get to a hospital where that can be provided just yet.  I will try to challenge him with some high dose lasix to see if it will help volume and K with ultimate plan being to transfer to ICU in [facility] for CRRT  2. Hypertension/volume  - hypotensive on pressors but volume overloaded-  will try lasix challenge 3. Hyperkalemia - has been getting lokelma-  3 doses since yesterday PM-  lasix may help-  temporize with  lokelma until CRRT can be initiated  4. Anemia  - likely due to blood loss with anticoagulants-  transfuse PRN-    Louis Meckel 05/18/2022, 8:21 AM

## 2022-05-18 NOTE — Progress Notes (Signed)
[place] Progress Note Patient Name: Naithan Delage. DOB: 07/10/1960 MRN: [id number]   Date of Service  05/18/2022  HPI/Events of Note  Hyperkalemia  Hypocalcemia = K+ = 5.3 --> 5.5. Ca++ = 6.1 which corrects to 7.46 (Low) given Albumin = 2.3.   eICU Interventions  Plan: Lokelma 10 gm per tube now. Continue to trend K+. Replace Ca++.      Intervention Category Major Interventions: Electrolyte abnormality - evaluation and management  Debrina Kizer Eugene 05/18/2022, 3:33 AM

## 2022-05-18 NOTE — Progress Notes (Signed)
  Echocardiogram 2D Echocardiogram has been performed.  Frank Shelton 05/18/2022, 4:52 PM

## 2022-05-19 ENCOUNTER — Inpatient Hospital Stay (HOSPITAL_COMMUNITY): Payer: Medicare HMO

## 2022-05-19 DIAGNOSIS — I2694 Multiple subsegmental pulmonary emboli without acute cor pulmonale: Secondary | ICD-10-CM | POA: Diagnosis not present

## 2022-05-19 DIAGNOSIS — J9601 Acute respiratory failure with hypoxia: Secondary | ICD-10-CM | POA: Diagnosis not present

## 2022-05-19 DIAGNOSIS — N179 Acute kidney failure, unspecified: Secondary | ICD-10-CM | POA: Diagnosis not present

## 2022-05-19 LAB — POCT I-STAT 7, (LYTES, BLD GAS, ICA,H+H)
Acid-base deficit: 4 mmol/L — ABNORMAL HIGH (ref 0.0–2.0)
Acid-base deficit: 4 mmol/L — ABNORMAL HIGH (ref 0.0–2.0)
Bicarbonate: 19.4 mmol/L — ABNORMAL LOW (ref 20.0–28.0)
Bicarbonate: 19.4 mmol/L — ABNORMAL LOW (ref 20.0–28.0)
Calcium, Ion: 0.9 mmol/L — ABNORMAL LOW (ref 1.15–1.40)
Calcium, Ion: 0.91 mmol/L — ABNORMAL LOW (ref 1.15–1.40)
HCT: 20 % — ABNORMAL LOW (ref 39.0–52.0)
HCT: 23 % — ABNORMAL LOW (ref 39.0–52.0)
Hemoglobin: 6.8 g/dL — CL (ref 13.0–17.0)
Hemoglobin: 7.8 g/dL — ABNORMAL LOW (ref 13.0–17.0)
O2 Saturation: 96 %
O2 Saturation: 98 %
Patient temperature: 36.5
Patient temperature: 37.5
Potassium: 4.3 mmol/L (ref 3.5–5.1)
Potassium: 4.5 mmol/L (ref 3.5–5.1)
Sodium: 137 mmol/L (ref 135–145)
Sodium: 138 mmol/L (ref 135–145)
TCO2: 20 mmol/L — ABNORMAL LOW (ref 22–32)
TCO2: 20 mmol/L — ABNORMAL LOW (ref 22–32)
pCO2 arterial: 25.9 mmHg — ABNORMAL LOW (ref 32–48)
pCO2 arterial: 30.1 mmHg — ABNORMAL LOW (ref 32–48)
pH, Arterial: 7.481 — ABNORMAL HIGH (ref 7.35–7.45)
pH, Arterial: 7.419 (ref 7.35–7.45)
pO2, Arterial: 74 mmHg — ABNORMAL LOW (ref 83–108)
pO2, Arterial: 98 mmHg (ref 83–108)

## 2022-05-19 LAB — MAGNESIUM: Magnesium: 2.1 mg/dL (ref 1.7–2.4)

## 2022-05-19 LAB — GLUCOSE, CAPILLARY
Glucose-Capillary: 109 mg/dL — ABNORMAL HIGH (ref 70–99)
Glucose-Capillary: 113 mg/dL — ABNORMAL HIGH (ref 70–99)
Glucose-Capillary: 116 mg/dL — ABNORMAL HIGH (ref 70–99)
Glucose-Capillary: 117 mg/dL — ABNORMAL HIGH (ref 70–99)
Glucose-Capillary: 119 mg/dL — ABNORMAL HIGH (ref 70–99)
Glucose-Capillary: 123 mg/dL — ABNORMAL HIGH (ref 70–99)
Glucose-Capillary: 125 mg/dL — ABNORMAL HIGH (ref 70–99)
Glucose-Capillary: 125 mg/dL — ABNORMAL HIGH (ref 70–99)
Glucose-Capillary: 126 mg/dL — ABNORMAL HIGH (ref 70–99)
Glucose-Capillary: 126 mg/dL — ABNORMAL HIGH (ref 70–99)
Glucose-Capillary: 128 mg/dL — ABNORMAL HIGH (ref 70–99)
Glucose-Capillary: 129 mg/dL — ABNORMAL HIGH (ref 70–99)
Glucose-Capillary: 129 mg/dL — ABNORMAL HIGH (ref 70–99)
Glucose-Capillary: 132 mg/dL — ABNORMAL HIGH (ref 70–99)
Glucose-Capillary: 137 mg/dL — ABNORMAL HIGH (ref 70–99)

## 2022-05-19 LAB — BASIC METABOLIC PANEL
Anion gap: 19 — ABNORMAL HIGH (ref 5–15)
BUN: 115 mg/dL — ABNORMAL HIGH (ref 8–23)
CO2: 19 mmol/L — ABNORMAL LOW (ref 22–32)
Calcium: 6.3 mg/dL — CL (ref 8.9–10.3)
Chloride: 99 mmol/L (ref 98–111)
Creatinine, Ser: 8.91 mg/dL — ABNORMAL HIGH (ref 0.61–1.24)
GFR, Estimated: 6 mL/min — ABNORMAL LOW (ref >60)
Glucose, Bld: 128 mg/dL — ABNORMAL HIGH (ref 70–99)
Potassium: 4.3 mmol/L (ref 3.5–5.1)
Sodium: 137 mmol/L (ref 135–145)

## 2022-05-19 LAB — CBC
HCT: 22.7 % — ABNORMAL LOW (ref 39.0–52.0)
Hemoglobin: 7.6 g/dL — ABNORMAL LOW (ref 13.0–17.0)
MCH: 30.3 pg (ref 26.0–34.0)
MCHC: 33.5 g/dL (ref 30.0–36.0)
MCV: 90.4 fL (ref 80.0–100.0)
Platelets: 90 10*3/uL — ABNORMAL LOW (ref 150–400)
RBC: 2.51 MIL/uL — ABNORMAL LOW (ref 4.22–5.81)
RDW: 18 % — ABNORMAL HIGH (ref 11.5–15.5)
WBC: 12.7 10*3/uL — ABNORMAL HIGH (ref 4.0–10.5)

## 2022-05-19 LAB — POTASSIUM
Potassium: 4.5 mmol/L (ref 3.5–5.1)
Potassium: 4.5 mmol/L (ref 3.5–5.1)

## 2022-05-19 LAB — PROCALCITONIN: Procalcitonin: 16.84 ng/mL

## 2022-05-19 MED ORDER — CLONAZEPAM 1 MG PO TABS
1.0000 mg | ORAL_TABLET | Freq: Two times a day (BID) | ORAL | Status: DC
  Administered 2022-05-19 – 2022-05-20 (×3): 1 mg
  Filled 2022-05-19 (×3): qty 1

## 2022-05-19 MED ORDER — CALCIUM GLUCONATE-NACL 2-0.675 GM/100ML-% IV SOLN
2.0000 g | Freq: Once | INTRAVENOUS | Status: AC
  Administered 2022-05-19: 2000 mg via INTRAVENOUS
  Filled 2022-05-19: qty 100

## 2022-05-19 MED ORDER — INSULIN ASPART 100 UNIT/ML IJ SOLN
3.0000 [IU] | INTRAMUSCULAR | Status: DC
  Administered 2022-05-19 – 2022-05-20 (×3): 3 [IU] via SUBCUTANEOUS

## 2022-05-19 MED ORDER — QUETIAPINE FUMARATE 100 MG PO TABS
100.0000 mg | ORAL_TABLET | Freq: Two times a day (BID) | ORAL | Status: DC
  Administered 2022-05-19 – 2022-05-24 (×11): 100 mg
  Filled 2022-05-19 (×11): qty 1

## 2022-05-19 MED ORDER — INSULIN DETEMIR 100 UNIT/ML ~~LOC~~ SOLN
5.0000 [IU] | Freq: Two times a day (BID) | SUBCUTANEOUS | Status: DC
  Administered 2022-05-19 (×2): 5 [IU] via SUBCUTANEOUS
  Filled 2022-05-19 (×3): qty 0.05

## 2022-05-19 MED ORDER — DEXTROSE 10 % IV SOLN: INTRAVENOUS | Status: DC | PRN

## 2022-05-19 MED ORDER — HYDROCORTISONE SOD SUC (PF) 100 MG IJ SOLR
50.0000 mg | Freq: Two times a day (BID) | INTRAMUSCULAR | Status: DC
  Administered 2022-05-19 – 2022-05-20 (×2): 50 mg via INTRAVENOUS
  Filled 2022-05-19 (×2): qty 2

## 2022-05-19 MED ORDER — INSULIN ASPART 100 UNIT/ML IJ SOLN
3.0000 [IU] | INTRAMUSCULAR | Status: DC
  Administered 2022-05-19 (×3): 3 [IU] via SUBCUTANEOUS
  Administered 2022-05-20 (×3): 6 [IU] via SUBCUTANEOUS

## 2022-05-19 MED ORDER — VITAL 1.5 CAL PO LIQD
1000.0000 mL | ORAL | Status: DC
  Administered 2022-05-19: 1000 mL

## 2022-05-19 MED ORDER — INSULIN ASPART 100 UNIT/ML IJ SOLN: 1.0000 [IU] | INTRAMUSCULAR | Status: DC

## 2022-05-19 NOTE — Progress Notes (Addendum)
[place] Progress Note Patient Name: Frank Shelton. DOB: 1960-11-07 MRN: [id number]   Date of Service  05/19/2022  HPI/Events of Note  Ventilator asynchrony - Patient double stacking breaths. Currently sedated with Precedex IV infusion at ceiling, Fentanyl IV infusion at 350 mcg/hour and Versed IV PRN.  eICU Interventions  Plan: Increase ceiling on Precedex IV infusion to 1.5 mcg/kg/hour. Please give scheduled Lasix when sent up from pharmacy. Portable CXR STAT.      Intervention Category Major Interventions: Respiratory failure - evaluation and management  Kiya Eno [name] 05/19/2022, 9:35 PM

## 2022-05-19 NOTE — Progress Notes (Signed)
An USGPIV (ultrasound guided PIV) has been placed for short-term vasopressor infusion. A correctly placed ivWatch must be used when administering Vasopressors. Should this treatment be needed beyond 72 hours, central line access should be obtained.  It will be the responsibility of the bedside nurse to follow best practice to prevent extravasations.   ?

## 2022-05-19 NOTE — Progress Notes (Signed)
[place] Progress Note Patient Name: Frank Shelton. DOB: [birth year] MRN: [id number]   Date of Service  05/19/2022  HPI/Events of Note  Hypocalcemia - Ca++ = 6.3 which corrects to 7.66 (Low) given Albumin = 2.3.   eICU Interventions  Will replace Ca++.     Intervention Category Major Interventions: Electrolyte abnormality - evaluation and management  [name] 05/19/2022, 4:59 AM

## 2022-05-19 NOTE — Progress Notes (Addendum)
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 but missed a few doses over xmas and presented 12/28 with recurrent PE in setting of  DVT >L> R on Dopplers with RV/LV 1.2 no change from CT 05-31-2020 and echo ok but coded am 27-May-2023 and transferred to ICU where  given Altepase empirically and noted to have R groin hematoma at sight of R CVL during code with one arterial stick and hgb dropping therefafter with neg GI source to date but all anticoagulation held and PCCM service asked to see pending transfer to [facility] when bed available with worsening uop/increasing K and creat developing while still on pressors and full vent support.   Pertinent  Medical History  obesity, T2DM, HTN, gout, COPD, hyperlipidemia. He also has a history of previous DVT/PE on xarelto.   Significant Hospital Events: Including procedures, antibiotic start and stop dates in addition to other pertinent events   2023/05/27: Cardiac arrest while in [facility]. CPR  initiated. TNK administered during CPR, fem cvl  placed/ post arrest shock, high dose pressors. Failed attempt on right femoral and right IJ access. Time to ROSC estimated over 30 minutes. Hgb dropped 12 to 8.8  12/31 on 3 pressors and stress dose steroids. In DKA. Insulin gtt started. LMWH placed on hold.  MRSA PCR screen 2023-05-27 > neg  BC x 2 1/1  > one /two pos gpc + staph hominis. Unasyn started.  1/3 transferred to [facility] central venous catheter removed.  Blood cultures repeated.  Repeat echo: EF 50 to 55% no regional wall abnormality grade 1 diastolic dysfunction RV normal Renal function improving with Lasix.  Weaning pressors.  Adding Seroquel and clonazepam to assist with weaning   Interim History / Subjective:  Agitated failed PSV attempt Objective   Blood  pressure 100/71, pulse 72, temperature 99.5 F (37.5 C), resp. rate 15, height 6\' 4"  (1.93 m), weight (Abnormal) 185.4 kg, SpO2 97 %.    Vent Mode: PRVC FiO2 (%):  [40 %-50 %] 40 % Set Rate:  [15 bmp-26 bmp] 15 bmp Vt Set:  [690 mL] 690 mL PEEP:  [5 cmH20] 5 cmH20 Plateau Pressure:  [17 cmH20-21 cmH20] 20 cmH20   Intake/Output Summary (Last 24 hours) at 31-May-2022 0839 Last data filed at 31-May-2022 0800 Gross per 24 hour  Intake 2278.12 ml  Output 2275 ml  Net 3.12 ml   Filed Weights   05/16/22 0500 05/18/22 0444 05-31-2022 0500  Weight: (Abnormal) 169.9 kg (Abnormal) 179.2 kg (Abnormal) 185.4 kg    Examination: General chronically ill-appearing 62 year old male patient currently on full ventilator support HEENT normocephalic atraumatic orally intubated Pulmonary: Clear, diminished bases Cardiac: Regular rate and rhythm Chest x-ray from 1/3 showed right greater than left pulmonary congestion Abdomen: Soft nontender Extremities: Warm dry chronic venous stasis changes with lower extremity edema Neuro: Awake, purposeful, moves all extremities but I cannot get him to follow commands GU: Clear yellow  Assessment & Plan:  Circulatory shock s/p cardiac arrest. Admitted w/ PE given altepase Primary working dx: hemorrhagic c/b aspiration/sepsis, as of 1/4 hypotension likely heavily driven by sedating medications Plan Wean norepinephrine  Continuing diuresis  Continue stress dose steroids  Telemetry monitoring   PE/dvt acute on ? Chronic s/p altpase 2023/05/27 am but no further  active bleeding   Still has residual DVT LLE and ch DVT RLL; he already has IVC filter so seems as though this reflects IVC filter failure Plan Holding anticoagulation Will need to rechallenge as soon, reasonable to consider rechallenge on 1/6 if hemoglobin stays stable as that will be 72 hours  vent dep resp failure s/p cardiac arrest superimposed on underlying COPD and OSA Plan Continuing full ventilator  support Continuing diuresis Adding Seroquel and clonazepam Try to wean fentanyl Continue Precedex RASS goal 0 to -1, this may be difficult VAP bundle Repeat x-ray a.m.  Acute metabolic encephalopathy status postcardiac arrest Plan Supportive care  Aspiration PNA vs HCAP Plan Day 4 Unasyn  Staph Hominis bacteremia (oxacillin sens) Procalcitonin continues to improve  Plan  Await repeat blood cultures  Continue Unasyn  Curb sided with infectious disease, no need for TEE  AKI w/ worsening renal fxn and Hyperkalemia Potassium normalized, renal function improving with diuresis  plan Keep Foley catheter in place Will hold off on HD catheter Continue diuresis  Acute blood loss anemia/ mulple hematoma's but no obvious ongoing GIb and GI signed off 1/2 Plan Continuing PPI twice daily Will continue to hold anticoagulation for now, I do think we will need to rechallenge heparin in the next day or so  Thrombocytopenia/ likely acute / consumptive Stable  Plan Monitor   Hypothyroidism  Plan Synthroid  DM w/ severe hyperglycemia Plan Transition insulin drip Glargine  5 units every 12, although I worry this will not be enough, so we will add tube feed coverage Resistant sliding scale   H/o Gout Plan Symptomatic support  Best Practice (right click and "Reselect all SmartList Selections" daily)   Diet/type: tubefeeds DVT prophylaxis: not indicated GI prophylaxis: PPI Lines: Central line Foley:  Yes, and it is still needed Code Status:  DNR Last date of multidisciplinary goals of care discussion > discussed with fm at bedside at 1 pm 2/2  My time 32 min   Erick Colace ACNP-BC [place] Pager # [phone] OR # [phone] if no answer

## 2022-05-19 NOTE — Progress Notes (Signed)
[place] Progress Note Patient Name: Frank Shelton. DOB: 1960-05-28 MRN: [id number]   Date of Service  05/19/2022  HPI/Events of Note  ABG on 50%/PRVC 20/TV 690/P 5 = 7.48/25/74/19.   eICU Interventions  Plan: Decrease RRVC rate to 15. Sedate so that patient is breathing with ventilator. Repeat ABG at 6 AM.        Lysle Dingwall 05/19/2022, 3:50 AM

## 2022-05-19 NOTE — Progress Notes (Signed)
Admit: 05/12/2022 LOS: 5  21M with recurrent PE s/p cardiac arrest, s/p TNK; shock, VDRF  Subjective:  SCr 9.1 to 8.9, K 4.3, HCO3 19, BUN 115 Weening off NE  Made 2.3L UOP yesterday with high dose bolus lasix  01/03 0701 - 01/04 0700 In: 2166 [I.V.:1384.5; NG/GT:465.3; IV Piggyback:316.1] Out: 2275 [Urine:2275]  Filed Weights   05/16/22 0500 05/18/22 0444 05/19/22 0500  Weight: (!) 169.9 kg (!) 179.2 kg (!) 185.4 kg    Scheduled Meds:  sodium chloride   Intravenous Once   budesonide (PULMICORT) nebulizer solution  0.5 mg Nebulization BID   Chlorhexidine Gluconate Cloth  6 each Topical Daily   clonazePAM  1 mg Per Tube BID   dextrose  1 ampule Intravenous Once   docusate  100 mg Per Tube BID   feeding supplement (PROSource TF20)  60 mL Per Tube TID   hydrocortisone sod succinate (SOLU-CORTEF) inj  100 mg Intravenous Q12H   insulin aspart  1 Units Subcutaneous Q4H   insulin aspart  3 Units Subcutaneous Q4H   insulin aspart  3-9 Units Subcutaneous Q4H   insulin detemir  5 Units Subcutaneous Q12H   ipratropium-albuterol  3 mL Nebulization QID   levothyroxine  200 mcg Per Tube QAC breakfast   multivitamin  1 tablet Per Tube Daily   mouth rinse  15 mL Mouth Rinse Q2H   pantoprazole (PROTONIX) IV  40 mg Intravenous Q12H   polyethylene glycol  17 g Per Tube Daily   QUEtiapine  100 mg Per Tube BID   Continuous Infusions:  sodium chloride 10 mL/hr at 05/19/22 0800   sodium chloride     ampicillin-sulbactam (UNASYN) IV Stopped (05/18/22 1018)   dexmedetomidine (PRECEDEX) IV infusion 1 mcg/kg/hr (05/19/22 0800)   dextrose     feeding supplement (VITAL AF 1.2 CAL) 40 mL/hr at 05/19/22 0815   fentaNYL infusion INTRAVENOUS 200 mcg/hr (05/19/22 0815)   furosemide 160 mg (05/19/22 0000)   insulin 0.7 Units/hr (05/19/22 0800)   norepinephrine (LEVOPHED) Adult infusion 1 mcg/min (05/19/22 0800)   PRN Meds:.acetaminophen **OR** acetaminophen, albuterol, dextrose, dextrose, midazolam,  mouth rinse  Current Labs: reviewed    Physical Exam:  Blood pressure 100/71, pulse 72, temperature 99.5 F (37.5 C), resp. rate 15, height 6\' 4"  (1.93 m), weight (!) 185.4 kg, SpO2 97 %. Obese, intubated, sedated Regular ,normal rate Coarse bs b/l ETT in place  Foley in place  Chronic hyperpigmentation of LEs b/l S/nt  A Severe AKI, normal GFR; now nonoliguric and lasix  responsive has not yet req RRT Recurernt PE s/p cardiac arrest, shock on pressors VDRF, hx/o COPD and OSA ? Aspiration PNA / HCAP on unasyn S hominis bacteremia on unasyn DM2 Hypocalcemia, repleted by CCM Anemia, no role for ESA, transfuse prn  P Might be recovering GFR, UOP much improevd No RRT needed OK to cont lasix today Will follow closely Medication Issues; Preferred narcotic agents for pain control are hydromorphone, fentanyl, and methadone. Morphine should not be used.  Baclofen should be avoided Avoid oral sodium phosphate and magnesium citrate based laxatives / bowel preps    [name] MD 05/19/2022, 9:05 AM  Recent Labs  Lab 05/15/22 0043 05/15/22 0353 05/18/22 0241 05/18/22 0648 05/18/22 1528 05/18/22 1550 05/19/22 0122 05/19/22 0145  NA 137   < > 135  --  137 136 137 137  K 3.6   < > 5.5*   < > 5.0 4.9 4.3 4.3  CL 102   < > 100  --  99  --   --  99  CO2 18*   < > 23  --  20*  --   --  19*  GLUCOSE 379*   < > 297*  --  292*  --   --  128*  BUN 33*   < > 103*  --  110*  --   --  115*  CREATININE 4.09*   < > 9.10*  --  8.94*  --   --  8.91*  CALCIUM 6.3*   < > 6.1*  --  6.3*  --   --  6.3*  PHOS 5.2*  --   --   --   --   --   --   --    < > = values in this interval not displayed.   Recent Labs  Lab 05/17/22 0443 05/17/22 1421 05/18/22 0241 05/18/22 1550 05/19/22 0122 05/19/22 0145  WBC 9.3  --  9.0  --   --  12.7*  HGB 6.9*   < > 7.2* 7.5* 6.8* 7.6*  HCT 20.4*   < > 21.8* 22.0* 20.0* 22.7*  MCV 92.7  --  91.2  --   --  90.4  PLT 104*  --  87*  --   --  90*   < > =  values in this interval not displayed.

## 2022-05-19 NOTE — Progress Notes (Signed)
[place] Progress Note Patient Name: Frank Shelton. DOB: [birth year] MRN: [id number]   Date of Service  05/19/2022  HPI/Events of Note  Multiple issues: 1. Review of CXR reveals: 1. Persistent cardiomegaly with prominent hilar vasculature increased markings suggestive of pulmonary edema. Possible bilateral trace pleural effusions. 2. Patient continues to have ventilator asynchrony. Now on maximum doses of Precedex and Fentanyl IV infusion.   eICU Interventions  Plan: Continue diuresis as ordered.  Place BIS monitor. If adequately sedated, will require NMB.      Intervention Category Major Interventions: Other:;Hypoxemia - evaluation and management  Kortlyn Koltz [name] 05/19/2022, 10:53 PM

## 2022-05-19 NOTE — Progress Notes (Signed)
[place] Progress Note Patient Name: Frank Corp. DOB: 10-07-1960 MRN: [id number]   Date of Service  05/19/2022  HPI/Events of Note  Agitation - Nursing request for bilateral wrist restraints.   eICU Interventions  Will order bilateral soft wrist restraints X 8 hours.      Intervention Category Major Interventions: Delirium, psychosis, severe agitation - evaluation and management  Frank Shelton Eugene 05/19/2022, 1:31 AM

## 2022-05-19 NOTE — Progress Notes (Signed)
US

## 2022-05-20 ENCOUNTER — Inpatient Hospital Stay (HOSPITAL_COMMUNITY): Payer: Medicare HMO

## 2022-05-20 DIAGNOSIS — J9611 Chronic respiratory failure with hypoxia: Secondary | ICD-10-CM

## 2022-05-20 DIAGNOSIS — J9612 Chronic respiratory failure with hypercapnia: Secondary | ICD-10-CM | POA: Diagnosis not present

## 2022-05-20 DIAGNOSIS — I469 Cardiac arrest, cause unspecified: Secondary | ICD-10-CM | POA: Diagnosis not present

## 2022-05-20 DIAGNOSIS — J9601 Acute respiratory failure with hypoxia: Secondary | ICD-10-CM | POA: Diagnosis not present

## 2022-05-20 DIAGNOSIS — N179 Acute kidney failure, unspecified: Secondary | ICD-10-CM | POA: Diagnosis not present

## 2022-05-20 LAB — COMPREHENSIVE METABOLIC PANEL
ALT: 87 U/L — ABNORMAL HIGH (ref 0–44)
ALT: 83 U/L — ABNORMAL HIGH (ref 0–44)
AST: 91 U/L — ABNORMAL HIGH (ref 15–41)
AST: 75 U/L — ABNORMAL HIGH (ref 15–41)
Albumin: 2.2 g/dL — ABNORMAL LOW (ref 3.5–5.0)
Albumin: 2.1 g/dL — ABNORMAL LOW (ref 3.5–5.0)
Alkaline Phosphatase: 88 U/L (ref 38–126)
Alkaline Phosphatase: 80 U/L (ref 38–126)
Anion gap: 17 — ABNORMAL HIGH (ref 5–15)
Anion gap: 18 — ABNORMAL HIGH (ref 5–15)
BUN: 130 mg/dL — ABNORMAL HIGH (ref 8–23)
BUN: 134 mg/dL — ABNORMAL HIGH (ref 8–23)
CO2: 20 mmol/L — ABNORMAL LOW (ref 22–32)
CO2: 21 mmol/L — ABNORMAL LOW (ref 22–32)
Calcium: 6.6 mg/dL — ABNORMAL LOW (ref 8.9–10.3)
Calcium: 6.5 mg/dL — ABNORMAL LOW (ref 8.9–10.3)
Chloride: 102 mmol/L (ref 98–111)
Chloride: 102 mmol/L (ref 98–111)
Creatinine, Ser: 9.21 mg/dL — ABNORMAL HIGH (ref 0.61–1.24)
Creatinine, Ser: 9.62 mg/dL — ABNORMAL HIGH (ref 0.61–1.24)
GFR, Estimated: 6 mL/min — ABNORMAL LOW (ref >60)
GFR, Estimated: 6 mL/min — ABNORMAL LOW (ref >60)
Glucose, Bld: 170 mg/dL — ABNORMAL HIGH (ref 70–99)
Glucose, Bld: 145 mg/dL — ABNORMAL HIGH (ref 70–99)
Potassium: 4.2 mmol/L (ref 3.5–5.1)
Potassium: 4.1 mmol/L (ref 3.5–5.1)
Sodium: 139 mmol/L (ref 135–145)
Sodium: 141 mmol/L (ref 135–145)
Total Bilirubin: 1.8 mg/dL — ABNORMAL HIGH (ref 0.3–1.2)
Total Bilirubin: 2 mg/dL — ABNORMAL HIGH (ref 0.3–1.2)
Total Protein: 5.3 g/dL — ABNORMAL LOW (ref 6.5–8.1)
Total Protein: 5.5 g/dL — ABNORMAL LOW (ref 6.5–8.1)

## 2022-05-20 LAB — CBC WITH DIFFERENTIAL/PLATELET
Abs Immature Granulocytes: 0 10*3/uL (ref 0.00–0.07)
Basophils Absolute: 0 10*3/uL (ref 0.0–0.1)
Basophils Relative: 0 %
Eosinophils Absolute: 0.1 10*3/uL (ref 0.0–0.5)
Eosinophils Relative: 1 %
HCT: 24.6 % — ABNORMAL LOW (ref 39.0–52.0)
Hemoglobin: 7.9 g/dL — ABNORMAL LOW (ref 13.0–17.0)
Lymphocytes Relative: 10 %
Lymphs Abs: 1.2 10*3/uL (ref 0.7–4.0)
MCH: 30.4 pg (ref 26.0–34.0)
MCHC: 32.1 g/dL (ref 30.0–36.0)
MCV: 94.6 fL (ref 80.0–100.0)
Monocytes Absolute: 0.4 10*3/uL (ref 0.1–1.0)
Monocytes Relative: 3 %
Neutro Abs: 10.5 10*3/uL — ABNORMAL HIGH (ref 1.7–7.7)
Neutrophils Relative %: 86 %
Platelets: 129 10*3/uL — ABNORMAL LOW (ref 150–400)
RBC: 2.6 MIL/uL — ABNORMAL LOW (ref 4.22–5.81)
RDW: 18.3 % — ABNORMAL HIGH (ref 11.5–15.5)
WBC: 12.2 10*3/uL — ABNORMAL HIGH (ref 4.0–10.5)
nRBC: 12 /100 WBC — ABNORMAL HIGH
nRBC: 13.3 % — ABNORMAL HIGH (ref 0.0–0.2)

## 2022-05-20 LAB — MAGNESIUM: Magnesium: 2.2 mg/dL (ref 1.7–2.4)

## 2022-05-20 LAB — GLUCOSE, CAPILLARY
Glucose-Capillary: 139 mg/dL — ABNORMAL HIGH (ref 70–99)
Glucose-Capillary: 142 mg/dL — ABNORMAL HIGH (ref 70–99)
Glucose-Capillary: 152 mg/dL — ABNORMAL HIGH (ref 70–99)
Glucose-Capillary: 156 mg/dL — ABNORMAL HIGH (ref 70–99)
Glucose-Capillary: 162 mg/dL — ABNORMAL HIGH (ref 70–99)
Glucose-Capillary: 164 mg/dL — ABNORMAL HIGH (ref 70–99)
Glucose-Capillary: 169 mg/dL — ABNORMAL HIGH (ref 70–99)

## 2022-05-20 LAB — POTASSIUM: Potassium: 4.2 mmol/L (ref 3.5–5.1)

## 2022-05-20 LAB — HEPARIN LEVEL (UNFRACTIONATED): Heparin Unfractionated: <0.1 IU/mL — ABNORMAL LOW (ref 0.30–0.70)

## 2022-05-20 MED ORDER — FENTANYL CITRATE PF 50 MCG/ML IJ SOSY
50.0000 ug | PREFILLED_SYRINGE | Freq: Once | INTRAMUSCULAR | Status: DC
  Filled 2022-05-20: qty 1

## 2022-05-20 MED ORDER — CISATRACURIUM BESYLATE 20 MG/10ML IV SOLN
0.1000 mg/kg | INTRAVENOUS | Status: DC | PRN
  Administered 2022-05-20 (×5): 18.6 mg via INTRAVENOUS
  Filled 2022-05-20 (×6): qty 10

## 2022-05-20 MED ORDER — ETOMIDATE 2 MG/ML IV SOLN
INTRAVENOUS | Status: AC
  Filled 2022-05-20: qty 10

## 2022-05-20 MED ORDER — INSULIN ASPART 100 UNIT/ML IJ SOLN
3.0000 [IU] | INTRAMUSCULAR | Status: DC
  Administered 2022-05-20 (×2): 3 [IU] via SUBCUTANEOUS
  Administered 2022-05-21 (×2): 6 [IU] via SUBCUTANEOUS
  Administered 2022-05-21: 3 [IU] via SUBCUTANEOUS
  Administered 2022-05-21: 6 [IU] via SUBCUTANEOUS
  Administered 2022-05-21 (×2): 3 [IU] via SUBCUTANEOUS
  Administered 2022-05-22 (×2): 6 [IU] via SUBCUTANEOUS
  Administered 2022-05-22: 3 [IU] via SUBCUTANEOUS
  Administered 2022-05-22: 9 [IU] via SUBCUTANEOUS
  Administered 2022-05-22 (×2): 6 [IU] via SUBCUTANEOUS
  Administered 2022-05-23: 9 [IU] via SUBCUTANEOUS
  Administered 2022-05-23: 6 [IU] via SUBCUTANEOUS
  Administered 2022-05-23 – 2022-05-24 (×9): 9 [IU] via SUBCUTANEOUS

## 2022-05-20 MED ORDER — INSULIN DETEMIR 100 UNIT/ML ~~LOC~~ SOLN
3.0000 [IU] | Freq: Two times a day (BID) | SUBCUTANEOUS | Status: DC
  Filled 2022-05-20 (×2): qty 0.03

## 2022-05-20 MED ORDER — ETOMIDATE 2 MG/ML IV SOLN
20.0000 mg | Freq: Once | INTRAVENOUS | Status: AC
  Administered 2022-05-20: 20 mg via INTRAVENOUS

## 2022-05-20 MED ORDER — VITAL 1.5 CAL PO LIQD
1000.0000 mL | ORAL | Status: DC
  Administered 2022-05-20 – 2022-05-22 (×2): 1000 mL

## 2022-05-20 MED ORDER — AMIODARONE LOAD VIA INFUSION
150.0000 mg | Freq: Once | INTRAVENOUS | Status: AC
  Administered 2022-05-20: 150 mg via INTRAVENOUS
  Filled 2022-05-20: qty 83.34

## 2022-05-20 MED ORDER — ROCURONIUM BROMIDE 50 MG/5ML IV SOLN
100.0000 mg | Freq: Once | INTRAVENOUS | Status: AC
  Administered 2022-05-20: 100 mg via INTRAVENOUS
  Filled 2022-05-20: qty 10

## 2022-05-20 MED ORDER — ARTIFICIAL TEARS OPHTHALMIC OINT
1.0000 | TOPICAL_OINTMENT | Freq: Three times a day (TID) | OPHTHALMIC | Status: DC
  Administered 2022-05-20 – 2022-05-22 (×9): 1 via OPHTHALMIC
  Filled 2022-05-20: qty 3.5

## 2022-05-20 MED ORDER — HEPARIN (PORCINE) 25000 UT/250ML-% IV SOLN
1700.0000 [IU]/h | INTRAVENOUS | Status: DC
  Administered 2022-05-20: 1300 [IU]/h via INTRAVENOUS
  Administered 2022-05-21: 1700 [IU]/h via INTRAVENOUS
  Filled 2022-05-20 (×2): qty 250

## 2022-05-20 MED ORDER — NOREPINEPHRINE 4 MG/250ML-% IV SOLN
2.0000 ug/min | INTRAVENOUS | Status: DC
  Administered 2022-05-20 (×2): 8 ug/min via INTRAVENOUS
  Administered 2022-05-21: 6 ug/min via INTRAVENOUS
  Administered 2022-05-21: 8 ug/min via INTRAVENOUS
  Administered 2022-05-22 (×3): 10 ug/min via INTRAVENOUS
  Filled 2022-05-20 (×6): qty 250

## 2022-05-20 MED ORDER — AMIODARONE HCL IN DEXTROSE 360-4.14 MG/200ML-% IV SOLN
60.0000 mg/h | INTRAVENOUS | Status: AC
  Administered 2022-05-20 – 2022-05-21 (×3): 60 mg/h via INTRAVENOUS
  Filled 2022-05-20 (×2): qty 200

## 2022-05-20 MED ORDER — DEXMEDETOMIDINE HCL IN NACL 400 MCG/100ML IV SOLN
0.0000 ug/kg/h | INTRAVENOUS | Status: DC
  Filled 2022-05-20: qty 100

## 2022-05-20 MED ORDER — METOCLOPRAMIDE HCL 5 MG/ML IJ SOLN
10.0000 mg | Freq: Four times a day (QID) | INTRAMUSCULAR | Status: AC
  Administered 2022-05-20 – 2022-05-21 (×3): 10 mg via INTRAVENOUS
  Filled 2022-05-20 (×3): qty 2

## 2022-05-20 MED ORDER — SORBITOL 70 % SOLN
960.0000 mL | TOPICAL_OIL | Freq: Once | ORAL | Status: AC
  Administered 2022-05-20: 960 mL via RECTAL
  Filled 2022-05-20: qty 240

## 2022-05-20 MED ORDER — PROSOURCE TF20 ENFIT COMPATIBL EN LIQD
60.0000 mL | ENTERAL | Status: DC
  Administered 2022-05-20 – 2022-05-24 (×24): 60 mL
  Filled 2022-05-20 (×24): qty 60

## 2022-05-20 MED ORDER — CLONAZEPAM 1 MG PO TABS
2.0000 mg | ORAL_TABLET | Freq: Three times a day (TID) | ORAL | Status: DC
  Administered 2022-05-20 – 2022-05-24 (×12): 2 mg
  Filled 2022-05-20 (×12): qty 2

## 2022-05-20 MED ORDER — SODIUM CHLORIDE 0.9 % IV SOLN
250.0000 mL | INTRAVENOUS | Status: DC
  Administered 2022-05-20 – 2022-05-24 (×3): 250 mL via INTRAVENOUS

## 2022-05-20 MED ORDER — FENTANYL 2500MCG IN NS 250ML (10MCG/ML) PREMIX INFUSION
50.0000 ug/h | INTRAVENOUS | Status: DC
  Administered 2022-05-20: 400 ug/h via INTRAVENOUS
  Administered 2022-05-20: 250 ug/h via INTRAVENOUS
  Administered 2022-05-20: 300 ug/h via INTRAVENOUS
  Administered 2022-05-21 – 2022-05-22 (×5): 250 ug/h via INTRAVENOUS
  Administered 2022-05-23: 200 ug/h via INTRAVENOUS
  Administered 2022-05-24: 150 ug/h via INTRAVENOUS
  Filled 2022-05-20 (×10): qty 250

## 2022-05-20 MED ORDER — LACTULOSE 10 GM/15ML PO SOLN
20.0000 g | Freq: Three times a day (TID) | ORAL | Status: DC
  Administered 2022-05-20 – 2022-05-25 (×15): 20 g
  Filled 2022-05-20 (×18): qty 30

## 2022-05-20 MED ORDER — AMIODARONE IV BOLUS ONLY 150 MG/100ML: 150.0000 mg | Freq: Once | INTRAVENOUS | Status: DC

## 2022-05-20 MED ORDER — SORBITOL 70 % SOLN: 30.0000 mL | Freq: Once | Status: DC

## 2022-05-20 MED ORDER — FENTANYL BOLUS VIA INFUSION: 50.0000 ug | INTRAVENOUS | Status: DC | PRN

## 2022-05-20 MED ORDER — POLYETHYLENE GLYCOL 3350 17 G PO PACK
17.0000 g | PACK | Freq: Two times a day (BID) | ORAL | Status: DC
  Administered 2022-05-20 – 2022-05-26 (×11): 17 g
  Filled 2022-05-20 (×12): qty 1

## 2022-05-20 MED ORDER — ROCURONIUM BROMIDE 10 MG/ML (PF) SYRINGE
PREFILLED_SYRINGE | INTRAVENOUS | Status: AC
  Filled 2022-05-20: qty 10

## 2022-05-20 MED ORDER — AMIODARONE HCL IN DEXTROSE 360-4.14 MG/200ML-% IV SOLN
30.0000 mg/h | INTRAVENOUS | Status: DC
  Administered 2022-05-21 – 2022-05-27 (×17): 30 mg/h via INTRAVENOUS
  Filled 2022-05-20 (×16): qty 200

## 2022-05-20 MED ORDER — MIDAZOLAM-SODIUM CHLORIDE 100-0.9 MG/100ML-% IV SOLN
0.5000 mg/h | INTRAVENOUS | Status: DC
  Administered 2022-05-20: 3 mg/h via INTRAVENOUS
  Administered 2022-05-21: 5 mg/h via INTRAVENOUS
  Administered 2022-05-22: 2 mg/h via INTRAVENOUS
  Administered 2022-05-27: 1 mg/h via INTRAVENOUS
  Filled 2022-05-20 (×4): qty 100

## 2022-05-20 NOTE — Progress Notes (Signed)
Nimbex 18.6 mg (9.3 mL) was given at 0451 and again at [place]. Remaining 0.7 mL was wasted for both doses in the stericycle in the med room with Doneta Public., RN as a witness.

## 2022-05-20 NOTE — Progress Notes (Signed)
[place] Progress Note Patient Name: Geofrey Silliman. DOB: 1960-10-06 MRN: [id number]   Date of Service  05/20/2022  HPI/Events of Note  AFIB with RVR - Ventricular rate = 143. BP = 114/47 on a Norepinephrine IV infusion. Patient is already on a Heparin IV infusion. CMP and Mg++ level are pending.   eICU Interventions  Plan: Amiodarone IV Load and Infusion.  Await K+ and Mg++ results.      Intervention Category Major Interventions: Arrhythmia - evaluation and management  Tiauna Whisnant [name] 05/20/2022, 7:56 PM

## 2022-05-20 NOTE — Procedures (Signed)
Bronchoscopy Procedure Note  Tomy Khim  [id number]  1960/12/05  Date:05/20/22  Time:9:26 AM   Provider Performing:Norvel Wenker   Procedure(s):  Flexible bronchoscopy with bronchial alveolar lavage [phone]) and Initial Therapeutic Aspiration of Tracheobronchial Tree [phone])  Indication(s) Acute respiratory failure  Consent Risks of the procedure as well as the alternatives and risks of each were explained to the patient and/or caregiver.  Consent for the procedure was obtained and is signed in the bedside chart  Anesthesia Etomidate and Rocuronium   Time Out Verified patient identification, verified procedure, site/side was marked, verified correct patient position, special equipment/implants available, medications/allergies/relevant history reviewed, required imaging and test results available.   Sterile Technique Usual hand hygiene, masks, gowns, and gloves were used   Procedure Description Bronchoscope advanced through endotracheal tube and into airway.  Airways were examined down to subsegmental level with findings noted below.   Following diagnostic evaluation, BAL(s) performed in RLL/RUL with normal saline and return of Yellowish fluid and Therapeutic aspiration performed in RML/RLL  Findings: Copious amounts of tenacious secretion noted throughout respiratory tree, erythematous mucosa noted in LLL   Complications/Tolerance None; patient tolerated the procedure well. Chest X-ray is not needed post procedure.   EBL Minimal   Specimen(s) BAL

## 2022-05-20 NOTE — Progress Notes (Addendum)
[place] Progress Note Patient Name: Frank Shelton. DOB: Jun 21, 1960 MRN: [id number]   Date of Service  05/20/2022  HPI/Events of Note  BIS score = 30's to 40's which provides adequate sedation for NMB.   eICU Interventions  Plan: Intermittant NMB with Nimbex d/t renal and hepatic dysfunction.  Maintain BIS < 60.     Intervention Category Major Interventions: Respiratory failure - evaluation and management  Shermeka Rutt Eugene 05/20/2022, 12:02 AM

## 2022-05-20 NOTE — Progress Notes (Signed)
ANTICOAGULATION CONSULT NOTE   Pharmacy Consult for heparin Indication: DVT  Allergies  Allergen Reactions   Nabumetone Hives    Patient Measurements: Height: 6\' 4"  (193 cm) Weight: (!) 185.4 kg (408 lb 11.7 oz) IBW/kg (Calculated) : 86.8 HEPARIN DW (KG): 129.7   Vital Signs: Temp: 98.1 F (36.7 C) (01/05 1500) Temp Source: Oral (01/05 1500) BP: 89/58 (01/05 1900) Pulse Rate: 104 (01/05 1900)  Labs: Recent Labs    05/18/22 0241 05/18/22 1528 05/18/22 1550 05/18/22 1656 05/18/22 1843 05/19/22 0122 05/19/22 0145 05/19/22 0606 05/20/22 0627 05/20/22 1753  HGB 7.2*  --    < >  --   --    < > 7.6* 7.8* 7.9*  --   HCT 21.8*  --    < >  --   --    < > 22.7* 23.0* 24.6*  --   PLT 87*  --   --   --   --   --  90*  --  129*  --   HEPARINUNFRC  --   --   --   --   --   --   --   --   --  <0.10*  CREATININE 9.10* 8.94*  --   --   --   --  8.91*  --  9.21*  --   TROPONINIHS  --   --   --  116* 125*  --   --   --   --   --    < > = values in this interval not displayed.     Estimated Creatinine Clearance: 14.7 mL/min (A) (by C-G formula based on SCr of 9.21 mg/dL (H)).   Medical History: Past Medical History:  Diagnosis Date   Arthritis    L shoulder & fingers    Bronchitis    during enviromental changes    Complication of anesthesia    Report of airway problem   Diabetes mellitus without complication (HCC)    Dyspnea    Embolism - blood clot    Gout    Headache    uses ibuprofen & tylenol & its relieved    Hypertension    Hypothyroidism    Myocardial infarction ([place]) 2012   OSA on CPAP    Peripheral vascular disease ([place])    Pulmonary embolism ([place])    Stroke ([place]) 2014   Assessment: 62 yo male with recurrent PE/DVT on xarelto PTA for history of PE/DVT. He is s/p TNK on 12/30 followed by hemorrhagic shock from right groin hematoma.  Plans are to start heparin conservatively with no bolus -hg= 7.9, plt= 129  Heparin level undetectable on 1300 units/hr.  This is not surprising given that he required 2000 units/hr recently to be therapeutic. No bleeding noted.  Goal of Therapy:  Heparin level 0.3-0.5 units/ml Monitor platelets by anticoagulation protocol: Yes   Plan:  Increase heparin to 1700 units/hr Will f/u 8 hr heparin level  Sherlon Handing, PharmD, BCPS Please see amion for complete clinical pharmacist phone list 05/20/2022 7:35 PM

## 2022-05-20 NOTE — Progress Notes (Signed)
An USGPIV (ultrasound guided PIV) has been placed for short-term vasopressor infusion. A correctly placed ivWatch must be used when administering Vasopressors. Should this treatment be needed beyond 72 hours, central line access should be obtained.  It will be the responsibility of the bedside nurse to follow best practice to prevent extravasations.   ?

## 2022-05-20 NOTE — Progress Notes (Signed)
Nutrition Follow-up  DOCUMENTATION CODES:   Morbid obesity  INTERVENTION:   Discussed concern re: retained contrast in stomach on xray today as pt has not received recent oral contrast. MD is aware. Plan for cautious initiation of TF and reglan  Tube Feeding via Cortrak (post pyloric):  Vital 1.5 at 10 ml/hr today per MD Goal TF regimen: Vital 1.5 at 11ml/hr (1440 ml per day) with Prosource TF20 60 ml TID  Provides 2400 kcal, 157 gm protein, 1100 ml free water daily  Pro-Source TF20 60 mL q 4 hours, each packet 80 kcals, 20 g of protein  Recommend continuing aggressive bowel regimen until pt with BM  If unable to tolerate TF, recommend initiation of TPN within 24-48 hours given inadequate nutrition x 8 days  NUTRITION DIAGNOSIS:   Inadequate oral intake related to acute illness as evidenced by NPO status.  Being addressed via TF   GOAL:   Patient will meet greater than or equal to 90% of their needs  Not Met  MONITOR:   Vent status, Labs, Weight trends, I & O's  REASON FOR ASSESSMENT:   Consult, Ventilator Enteral/tube feeding initiation and management  ASSESSMENT:   Pt admitted with chest pain and difficulty breathing, found to have multiple pulmonary emboli and LLE DVT. PMH significant for PE, DVT, DM, HTN, gout, COPD, OSA.  Pt remains on vent support; pt became agitated and restless overnight with vent dyssynchrony requiring increased sedation and paralytics. Off paralytic this AM  Pt with large volume of emesis overnight, TF on hold. Family at bedside today and indicates pt with nausea prior to admission but no vomiting. Cortrak placed today with tip in proximal duodenum, OG remains in stomach for decompression  No BM since admission, enema planned for today and noted addition of lactulose and SMOG enema, miralax  Creatinine not improved despite >4L UOP in 24 hours with lasix. Creatinine 9.2, BUN 130. No RRT at present per Nephrology  Labs: reviewed Meds:  lactulose, lasix gtt, ss novolog, reglan, rena-vite, colace  Diet Order:   Diet Order             Diet NPO time specified  Diet effective now                   EDUCATION NEEDS:   No education needs have been identified at this time  Skin:  Skin Assessment: Reviewed RN Assessment  Last BM:  12/31  Height:   Ht Readings from Last 1 Encounters:  05/18/22 6\' 4"  (1.93 m)    Weight:   Wt Readings from Last 1 Encounters:  05/19/22 (!) 185.4 kg    Ideal Body Weight:  91.8 kg  BMI:  Body mass index is 49.75 kg/m.  Estimated Nutritional Needs:   Kcal:  2200-2400  Protein:  140-160g  Fluid:  1L + UOP    Kerman Passey MS, RDN, LDN, CNSC Registered Dietitian 3 Clinical Nutrition RD Pager and On-Call Pager Number Located in [place]

## 2022-05-20 NOTE — Progress Notes (Signed)
[name] Progress Note Patient Name: Frank Shelton. DOB: [birth year] MRN: [id number]   Date of Service  05/20/2022  HPI/Events of Note  AFIB with RVR - K+ = 4.1 and Mg++ = 2.2. Ventricular rate = 120-140.  eICU Interventions  Plan: Rebolus with Amiodarone 150 mg IV over 10 minutes now.     Intervention Category Major Interventions: Arrhythmia - evaluation and management  [name] 05/20/2022, 10:11 PM

## 2022-05-20 NOTE — Progress Notes (Signed)
[facility] Progress Note Patient Name: Frank Shelton. DOB: Nov 12, 1960 MRN: [id number]   Date of Service  05/20/2022  HPI/Events of Note  Episode of vomiting large amount of tube feed. Nursing has stopped tube feeds. Concern for aspiration,  eICU Interventions  Plan: Portable CXR at 5 AM. Decrease Levemir to 3 units [place] Q 12 hours.      Intervention Category Major Interventions: Other:  Lysle Dingwall 05/20/2022, 3:38 AM

## 2022-05-20 NOTE — Procedures (Signed)
Cortrak  Person Inserting Tube:  Frank Shelton, RD Tube Type:  Cortrak - 43 inches Tube Size:  10 Tube Location:  Left nare Secured by: Bridle Technique Used to Measure Tube Placement:  Marking at nare/corner of mouth Cortrak Secured At:  96 cm   Cortrak Tube Team Note:  Consult received to place a Cortrak feeding tube.   X-ray is required, abdominal x-ray has been ordered by the Cortrak team. Please confirm tube placement before using the Cortrak tube.   If the tube becomes dislodged please keep the tube and contact the Cortrak team at [email or web address] for replacement.  If after hours and replacement cannot be delayed, place a NG tube and confirm placement with an abdominal x-ray.    Frank Kinzler P., RD, LDN, CNSC See AMiON for contact information    

## 2022-05-20 NOTE — Progress Notes (Signed)
ANTICOAGULATION CONSULT NOTE   Pharmacy Consult for heparin Indication: DVT  Allergies  Allergen Reactions   Nabumetone Hives    Patient Measurements: Height: 6\' 4"  (193 cm) Weight: (!) 185.4 kg (408 lb 11.7 oz) IBW/kg (Calculated) : 86.8 HEPARIN DW (KG): 129.7   Vital Signs: Temp: 99.3 F (37.4 C) (01/05 0700) Temp Source: Bladder (01/05 0400) BP: 114/69 (01/05 0700) Pulse Rate: 72 (01/05 0809)  Labs: Recent Labs    05/18/22 0241 05/18/22 1528 05/18/22 1550 05/18/22 1656 05/18/22 1843 05/19/22 0122 05/19/22 0145 05/19/22 0606 05/20/22 0627  HGB 7.2*  --    < >  --   --    < > 7.6* 7.8* 7.9*  HCT 21.8*  --    < >  --   --    < > 22.7* 23.0* 24.6*  PLT 87*  --   --   --   --   --  90*  --  129*  CREATININE 9.10* 8.94*  --   --   --   --  8.91*  --  9.21*  TROPONINIHS  --   --   --  116* 125*  --   --   --   --    < > = values in this interval not displayed.    Estimated Creatinine Clearance: 14.7 mL/min (A) (by C-G formula based on SCr of 9.21 mg/dL (H)).   Medical History: Past Medical History:  Diagnosis Date   Arthritis    L shoulder & fingers    Bronchitis    during enviromental changes    Complication of anesthesia    Report of airway problem   Diabetes mellitus without complication (HCC)    Dyspnea    Embolism - blood clot    Gout    Headache    uses ibuprofen & tylenol & its relieved    Hypertension    Hypothyroidism    Myocardial infarction ([facility]) 2012   OSA on CPAP    Peripheral vascular disease ([facility])    Pulmonary embolism ([facility])    Stroke ([facility]) 2014    Medications:  Scheduled:   sodium chloride   Intravenous Once   artificial tears  1 Application Both Eyes V5I   budesonide (PULMICORT) nebulizer solution  0.5 mg Nebulization BID   Chlorhexidine Gluconate Cloth  6 each Topical Daily   clonazePAM  2 mg Per Tube TID   dextrose  1 ampule Intravenous Once   docusate  100 mg Per Tube BID   feeding supplement (PROSource TF20)  60 mL Per  Tube TID   fentaNYL (SUBLIMAZE) injection  50 mcg Intravenous Once   ipratropium-albuterol  3 mL Nebulization QID   levothyroxine  200 mcg Per Tube QAC breakfast   multivitamin  1 tablet Per Tube Daily   mouth rinse  15 mL Mouth Rinse Q2H   pantoprazole (PROTONIX) IV  40 mg Intravenous Q12H   polyethylene glycol  17 g Per Tube BID   QUEtiapine  100 mg Per Tube BID   sorbitol, milk of mag, mineral oil, glycerin (SMOG) enema  960 mL Rectal Once    Assessment: 62 yo male with recurrent PE/DVT on xarelto PTA for history of PE/DVT. He is s/p TNK on 12/30 followed by hemorrhagic shock from right groin hematoma.  Plans are to start heparin conservatively with no bolus -hg= 7.9, plt= 129  Goal of Therapy:  Heparin level 0.3-0.5 units/ml Monitor platelets by anticoagulation protocol: Yes   Plan:  -start  heparin at 1300 units/hr -Heparin level in 8 hours and daily wth CBC daily  Hildred Laser, PharmD Clinical Pharmacist **Pharmacist phone directory can now be found on [email or web address] ([email or web address]).  Listed under [place].

## 2022-05-20 NOTE — Progress Notes (Signed)
Nimbex 18.6 mg (9.3 mL) given at 0147. 0.7 mL wasted at 0225 into Stericycle in med room and waste was witnessed by Doneta Public., RN.

## 2022-05-20 NOTE — Progress Notes (Signed)
Admit: 05/12/2022 LOS: 6  Frank Shelton with recurrent PE s/p cardiac arrest, s/p TNK; shock, VDRF  Subjective:  Great UOP on twice daily high-dose Lasix, 4.3 L with -2 L yesterday Serum creatinine stubbornly unchanged at 9.2 with BUN 130, K4.2 Slowly weaning on the vent  01/04 0701 - 01/05 0700 In: 3359.4 [I.V.:1805.2; NG/GT:1554.2] Out: 5395 [Urine:4345; Emesis/NG output:1050]  Filed Weights   05/16/22 0500 05/18/22 0444 05/19/22 0500  Weight: (!) 169.9 kg (!) 179.2 kg (!) 185.4 kg    Scheduled Meds:  sodium chloride   Intravenous Once   artificial tears  1 Application Both Eyes W0J   budesonide (PULMICORT) nebulizer solution  0.5 mg Nebulization BID   Chlorhexidine Gluconate Cloth  6 each Topical Daily   clonazePAM  2 mg Per Tube TID   dextrose  1 ampule Intravenous Once   docusate  100 mg Per Tube BID   feeding supplement (PROSource TF20)  60 mL Per Tube TID   fentaNYL (SUBLIMAZE) injection  50 mcg Intravenous Once   ipratropium-albuterol  3 mL Nebulization QID   levothyroxine  200 mcg Per Tube QAC breakfast   multivitamin  1 tablet Per Tube Daily   mouth rinse  15 mL Mouth Rinse Q2H   pantoprazole (PROTONIX) IV  40 mg Intravenous Q12H   polyethylene glycol  17 g Per Tube BID   QUEtiapine  100 mg Per Tube BID   sorbitol, milk of mag, mineral oil, glycerin (SMOG) enema  960 mL Rectal Once   Continuous Infusions:  sodium chloride Stopped (05/19/22 1340)   ampicillin-sulbactam (UNASYN) IV 3 g (05/20/22 0828)   feeding supplement (VITAL 1.5 CAL) Stopped (05/20/22 0205)   fentaNYL infusion INTRAVENOUS 350 mcg/hr (05/20/22 1000)   furosemide 160 mg (05/20/22 0922)   heparin 1,300 Units/hr (05/20/22 1000)   midazolam 3 mg/hr (05/20/22 1000)   PRN Meds:.acetaminophen **OR** acetaminophen, albuterol, dextrose, fentaNYL, midazolam, mouth rinse  Current Labs: reviewed    Physical Exam:  Blood pressure 99/62, pulse 81, temperature 99.5 F (37.5 C), resp. rate 15, height 6\' 4"  (1.93  m), weight (!) 185.4 kg, SpO2 93 %. Obese, intubated, sedated Regular ,normal rate Coarse bs b/l ETT in place  Foley in place  Chronic hyperpigmentation of LEs b/l, 2+ edema present S/nt  A Severe AKI, normal GFR at baseline; now nonoliguric and lasix responsive has not yet req RRT Recurernt PE s/p cardiac arrest, shock on pressors VDRF, hx/o COPD and OSA ? Aspiration PNA / HCAP on unasyn S hominis bacteremia on unasyn DM2 Hypocalcemia, repleted by CCM Anemia, no role for ESA, transfuse prn  P UOP suggests will be recovering GFR but lab work not yet consistent with that No indication for RRT, hopefully he will avoid Continue Lasix today We will follow closely Medication Issues; Preferred narcotic agents for pain control are hydromorphone, fentanyl, and methadone. Morphine should not be used.  Baclofen should be avoided Avoid oral sodium phosphate and magnesium citrate based laxatives / bowel preps    [name] MD 05/20/2022, 10:50 AM  Recent Labs  Lab 05/15/22 0043 05/15/22 0353 05/18/22 1528 05/18/22 1550 05/19/22 0145 05/19/22 0606 05/19/22 1023 05/19/22 1415 05/20/22 0627 05/20/22 0628  NA 137   < > 137   < > 137 138  --   --  139  --   K 3.6   < > 5.0   < > 4.3 4.5   < > 4.5 4.2 4.2  CL 102   < > 99  --  99  --   --   --  102  --   CO2 18*   < > 20*  --  19*  --   --   --  20*  --   GLUCOSE 379*   < > 292*  --  128*  --   --   --  170*  --   BUN 33*   < > 110*  --  115*  --   --   --  130*  --   CREATININE 4.09*   < > 8.94*  --  8.91*  --   --   --  9.21*  --   CALCIUM 6.3*   < > 6.3*  --  6.3*  --   --   --  6.6*  --   PHOS 5.2*  --   --   --   --   --   --   --   --   --    < > = values in this interval not displayed.    Recent Labs  Lab 05/18/22 0241 05/18/22 1550 05/19/22 0145 05/19/22 0606 05/20/22 0627  WBC 9.0  --  12.7*  --  12.2*  NEUTROABS  --   --   --   --  10.5*  HGB 7.2*   < > 7.6* 7.8* 7.9*  HCT 21.8*   < > 22.7* 23.0* 24.6*  MCV  91.2  --  90.4  --  94.6  PLT 87*  --  90*  --  129*   < > = values in this interval not displayed.

## 2022-05-20 NOTE — Progress Notes (Signed)
Nimbex 18.6 mg (9.3 mL) given at 0327. 0.7 mL wasted into stericycle in med room at 0333 and waste was witnessed by Doneta Public., RN.

## 2022-05-20 NOTE — Progress Notes (Signed)
f  NAME:  Frank Lamarque., MRN:  [id number], DOB:  11-05-1960, LOS: 6 ADMISSION DATE:  05/12/2022, CONSULTATION DATE:  05/17/22 REFERRING MD:  [facility], CHIEF COMPLAINT:  vent dep resp failure   History of Present Illness:  68 yowm with h/o PE/DVT on long term xarleto but missed a few doses over xmas and presented 12/28 with recurrent PE in setting of  DVT >L> R on Dopplers with RV/LV 1.2 no change from CT 2020-06-24 and echo ok but coded am 2023-06-13 and transferred to ICU where  given Altepase empirically and noted to have R groin hematoma at sight of R CVL during code with one arterial stick and hgb dropping therefafter with neg GI source to date but all anticoagulation held and PCCM service asked to see pending transfer [facility] when bed available with worsening uop/increasing K and creat developing while still on pressors and full vent support.   Pertinent  Medical History  obesity, T2DM, HTN, gout, COPD, hyperlipidemia. He also has a history of previous DVT/PE on xarelto.   Significant Hospital Events: Including procedures, antibiotic start and stop dates in addition to other pertinent events   June 13, 2023: Cardiac arrest while in BR. CPR  initiated. TNK administered during CPR, fem cvl  placed/ post arrest shock, high dose pressors. Failed attempt on right femoral and right IJ access. Time to ROSC estimated over 30 minutes. Hgb dropped 12 to 8.8  12/31 on 3 pressors and stress dose steroids. In DKA. Insulin gtt started. LMWH placed on hold.  MRSA PCR screen 06-13-23 > neg  BC x 2 1/1  > one /two pos gpc + staph hominis. Unasyn started.  1/3 transferred to [facility] central venous catheter removed.  Blood cultures repeated.  Repeat echo: EF 50 to 55% no regional wall abnormality grade 1 diastolic dysfunction RV normal Renal function improving with Lasix.  Weaning pressors.  Adding Seroquel and clonazepam to assist with weaning   Interim History / Subjective:  Patient became agitated and restless overnight,  leading to vent dyssynchrony and hypoxia, requiring increasing sedation, and as needed paralytics He vomited and aspirated last night Remains on vasopressor support  Objective   Blood pressure 114/69, pulse 72, temperature 99.3 F (37.4 C), resp. rate 18, height _0  (1.93 m), weight (!) 185.4 kg, SpO2 95 %.    Vent Mode: PRVC FiO2 (%):  [40 %-60 %] 50 % Set Rate:  [15 bmp] 15 bmp Vt Set:  [690 mL] 690 mL PEEP:  [5 cmH20] 5 cmH20 Plateau Pressure:  [20 cmH20-27 cmH20] 27 cmH20   Intake/Output Summary (Last 24 hours) at 05/20/2022 0932 Last data filed at 05/20/2022 2376 Gross per 24 hour  Intake 3247.21 ml  Output 5610 ml  Net -2362.79 ml   Filed Weights   05/16/22 0500 05/18/22 0444 05/19/22 0500  Weight: (!) 169.9 kg (!) 179.2 kg (!) 185.4 kg    Examination:   Physical exam: General: Crtitically ill-appearing morbidly obese male, orally intubated HEENT: [place]/AT, eyes anicteric.  ETT and OGT in place Neuro: Sedated, not following commands.  Eyes are closed.  Pupils 3 mm bilateral reactive to light Chest: Bilateral crackles right more than left, no wheezes or rhonchi Heart: Regular rate and rhythm, no murmurs or gallops Abdomen: Soft, nondistended, bowel sounds present Skin: No rash   Assessment & Plan:  Septic shock likely due to aspiration pneumonia Patient continues to require low-dose vasopressor support Last night he vomited and aspirated again, x-ray chest showed worsening right sided  infiltrate Continue IV Unasyn Bronch with BAL was performed, follow-up culture Titrate vasopressors for map goal 65 Hold tube feed for now  S/p PEA cardiac arrest likely in the setting of massive PE Patient went into asystolic cardiac arrest, as he presented with acute PE it was suspected patient had massive PE at that time, he received TNK Continue supportive care  Acute HFpEF with pulmonary edema Continue aggressive diuresis Monitor intake and output  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  Patient is s/p IVC filter placement After receiving TNK he had groin hematoma and bleeding from oral cavity He was off anticoagulation Will start low-dose heparin Monitor H&H  Acute hypoxic respiratory failure due to aspiration pneumonia Underlying COPD and OSA Continuing full ventilator support Not ready for SBT yet Continuing diuresis Switch Precedex to midazolam infusion Continue fentanyl infusion with RASS goal -2  Acute metabolic/septic encephalopathy Patient was agitated and restless overnight, required increasing sedation and neuromuscular blocker in the setting of the vent dyssynchrony Now neuromuscular blockers are Started on Versed infusion  Staph Hominis bacteremia Procalcitonin continues to improve  Repeat blood cultures have been negative Continue Unasyn Curb sided with infectious disease, no need for TEE  Acute kidney injury due to ischemic ATN in setting of cardiac arrest Hyperkalemia Serum creatinine remain elevated Nephrology is following Patient is diuresing very well, made 4 L urine yesterday BUN continue to trend up Serum creatinine has corrected  Acute blood loss anemia/ multiple hematoma's but no obvious ongoing Gi bleeding Continuing PPI twice daily Monitor H&H and transfuse if less than 7  Thrombocytopenia/ likely due to critical illness Platelet counts are improving, currently at 129  Hypothyroidism  Plan Synthroid  Poorly controlled DM w/ severe hyperglycemia Patient blood sugars are controlled now Will hold off Lantus and tube feed coverage as patient vomited overnight Hold the Lantus and tube feed coverage Continue sliding scale insulin  Morbid obesity Dietitian is following  Best Practice (right click and "Reselect all SmartList Selections" daily)   Diet/type: NPO DVT prophylaxis: Systemic heparin GI prophylaxis: PPI Lines: Central line Foley:  Yes, and it is still needed Code Status:  DNR Last date of  multidisciplinary goals of care discussion: 1/5 patient's son was updated at bedside  This patient is critically ill with multiple organ system failure which requires frequent high complexity decision making, assessment, support, evaluation, and titration of therapies. This was completed through the application of advanced monitoring technologies and extensive interpretation of multiple databases.  During this encounter critical care time was devoted to patient care services described in this note for 37 minutes.    Jacky Kindle, MD [facility] See Amion for pager If no response to pager, please call [phone] until 7pm After 7pm, Please call E-link [phone]

## 2022-05-20 NOTE — Progress Notes (Signed)
Nimbex 18.6 mg (9.3 mL) given at 0030. 0.7 mL wasted at 0040. Witnessed by Doneta Public., RN

## 2022-05-21 ENCOUNTER — Inpatient Hospital Stay (HOSPITAL_COMMUNITY): Payer: Medicare HMO

## 2022-05-21 DIAGNOSIS — R6521 Severe sepsis with septic shock: Secondary | ICD-10-CM

## 2022-05-21 DIAGNOSIS — A419 Sepsis, unspecified organism: Secondary | ICD-10-CM | POA: Diagnosis not present

## 2022-05-21 DIAGNOSIS — R042 Hemoptysis: Secondary | ICD-10-CM | POA: Diagnosis not present

## 2022-05-21 DIAGNOSIS — I2694 Multiple subsegmental pulmonary emboli without acute cor pulmonale: Secondary | ICD-10-CM | POA: Diagnosis not present

## 2022-05-21 DIAGNOSIS — J9601 Acute respiratory failure with hypoxia: Secondary | ICD-10-CM | POA: Diagnosis not present

## 2022-05-21 LAB — BASIC METABOLIC PANEL
Anion gap: 20 — ABNORMAL HIGH (ref 5–15)
BUN: 137 mg/dL — ABNORMAL HIGH (ref 8–23)
CO2: 22 mmol/L (ref 22–32)
Calcium: 6.4 mg/dL — CL (ref 8.9–10.3)
Chloride: 100 mmol/L (ref 98–111)
Creatinine, Ser: 9.5 mg/dL — ABNORMAL HIGH (ref 0.61–1.24)
GFR, Estimated: 6 mL/min — ABNORMAL LOW (ref >60)
Glucose, Bld: 145 mg/dL — ABNORMAL HIGH (ref 70–99)
Potassium: 4.3 mmol/L (ref 3.5–5.1)
Sodium: 142 mmol/L (ref 135–145)

## 2022-05-21 LAB — CBC
HCT: 24.6 % — ABNORMAL LOW (ref 39.0–52.0)
Hemoglobin: 8 g/dL — ABNORMAL LOW (ref 13.0–17.0)
MCH: 30.9 pg (ref 26.0–34.0)
MCHC: 32.5 g/dL (ref 30.0–36.0)
MCV: 95 fL (ref 80.0–100.0)
Platelets: 190 10*3/uL (ref 150–400)
RBC: 2.59 MIL/uL — ABNORMAL LOW (ref 4.22–5.81)
RDW: 19.7 % — ABNORMAL HIGH (ref 11.5–15.5)
WBC: 15.2 10*3/uL — ABNORMAL HIGH (ref 4.0–10.5)
nRBC: 3.6 % — ABNORMAL HIGH (ref 0.0–0.2)

## 2022-05-21 LAB — CULTURE, BLOOD (ROUTINE X 2)
Culture: NO GROWTH
Special Requests: ADEQUATE

## 2022-05-21 LAB — POCT I-STAT 7, (LYTES, BLD GAS, ICA,H+H)
Acid-base deficit: 3 mmol/L — ABNORMAL HIGH (ref 0.0–2.0)
Bicarbonate: 23.4 mmol/L (ref 20.0–28.0)
Calcium, Ion: 0.89 mmol/L — CL (ref 1.15–1.40)
HCT: 23 % — ABNORMAL LOW (ref 39.0–52.0)
Hemoglobin: 7.8 g/dL — ABNORMAL LOW (ref 13.0–17.0)
O2 Saturation: 92 %
Patient temperature: 99.1
Potassium: 4 mmol/L (ref 3.5–5.1)
Sodium: 141 mmol/L (ref 135–145)
TCO2: 25 mmol/L (ref 22–32)
pCO2 arterial: 46.1 mmHg (ref 32–48)
pH, Arterial: 7.315 — ABNORMAL LOW (ref 7.35–7.45)
pO2, Arterial: 71 mmHg — ABNORMAL LOW (ref 83–108)

## 2022-05-21 LAB — GLUCOSE, CAPILLARY
Glucose-Capillary: 153 mg/dL — ABNORMAL HIGH (ref 70–99)
Glucose-Capillary: 129 mg/dL — ABNORMAL HIGH (ref 70–99)
Glucose-Capillary: 133 mg/dL — ABNORMAL HIGH (ref 70–99)
Glucose-Capillary: 123 mg/dL — ABNORMAL HIGH (ref 70–99)
Glucose-Capillary: 154 mg/dL — ABNORMAL HIGH (ref 70–99)

## 2022-05-21 LAB — MAGNESIUM: Magnesium: 2.2 mg/dL (ref 1.7–2.4)

## 2022-05-21 LAB — HEPARIN LEVEL (UNFRACTIONATED): Heparin Unfractionated: 0.29 IU/mL — ABNORMAL LOW (ref 0.30–0.70)

## 2022-05-21 MED ORDER — AMIODARONE IV BOLUS ONLY 150 MG/100ML
150.0000 mg | Freq: Once | INTRAVENOUS | Status: AC
  Administered 2022-05-21: 150 mg via INTRAVENOUS

## 2022-05-21 MED ORDER — ROCURONIUM BROMIDE 10 MG/ML (PF) SYRINGE
PREFILLED_SYRINGE | INTRAVENOUS | Status: AC
  Administered 2022-05-21: 100 mg via INTRAVENOUS
  Filled 2022-05-21: qty 10

## 2022-05-21 MED ORDER — ROCURONIUM BROMIDE 10 MG/ML (PF) SYRINGE
100.0000 mg | PREFILLED_SYRINGE | Freq: Once | INTRAVENOUS | Status: AC
  Filled 2022-05-21: qty 10

## 2022-05-21 MED ORDER — METOCLOPRAMIDE HCL 5 MG/ML IJ SOLN
5.0000 mg | Freq: Four times a day (QID) | INTRAMUSCULAR | Status: AC
  Administered 2022-05-21 – 2022-05-22 (×4): 5 mg via INTRAVENOUS
  Filled 2022-05-21 (×4): qty 2

## 2022-05-21 MED ORDER — IPRATROPIUM BROMIDE 0.02 % IN SOLN
0.5000 mg | Freq: Four times a day (QID) | RESPIRATORY_TRACT | Status: DC
  Administered 2022-05-22 – 2022-05-23 (×8): 0.5 mg via RESPIRATORY_TRACT
  Filled 2022-05-21 (×8): qty 2.5

## 2022-05-21 MED ORDER — IPRATROPIUM-ALBUTEROL 0.5-2.5 (3) MG/3ML IN SOLN
3.0000 mL | Freq: Four times a day (QID) | RESPIRATORY_TRACT | Status: DC
  Filled 2022-05-21: qty 3

## 2022-05-21 MED ORDER — ALBUMIN HUMAN 25 % IV SOLN
12.5000 g | Freq: Once | INTRAVENOUS | Status: AC
  Administered 2022-05-21: 12.5 g via INTRAVENOUS
  Filled 2022-05-21: qty 50

## 2022-05-21 MED ORDER — LEVALBUTEROL HCL 0.63 MG/3ML IN NEBU
0.6300 mg | INHALATION_SOLUTION | Freq: Four times a day (QID) | RESPIRATORY_TRACT | Status: DC
  Administered 2022-05-22 – 2022-05-23 (×8): 0.63 mg via RESPIRATORY_TRACT
  Filled 2022-05-21 (×8): qty 3

## 2022-05-21 MED ORDER — CALCIUM GLUCONATE-NACL 1-0.675 GM/50ML-% IV SOLN
1.0000 g | Freq: Once | INTRAVENOUS | Status: AC
  Administered 2022-05-21: 1000 mg via INTRAVENOUS
  Filled 2022-05-21: qty 50

## 2022-05-21 MED ORDER — CALCIUM GLUCONATE-NACL 2-0.675 GM/100ML-% IV SOLN
2.0000 g | Freq: Once | INTRAVENOUS | Status: AC
  Administered 2022-05-21: 2000 mg via INTRAVENOUS
  Filled 2022-05-21: qty 100

## 2022-05-21 NOTE — Procedures (Signed)
Bronchoscopy Procedure Note  Frank Shelton  [id number]  [birth year]  Date:05/21/22  Time:12:28 PM   Provider Performing:Jaleen Finch   Procedure(s):  Flexible Bronchoscopy [phone]) and Subsequent Therapeutic Aspiration of Tracheobronchial Tree [phone])  Indication(s) Hemoptysis/acute respiratory failure  Consent Risks of the procedure as well as the alternatives and risks of each were explained to the patient and/or caregiver.  Consent for the procedure was obtained and is signed in the bedside chart  Anesthesia Midazolam/fentanyl/rocuronium   Time Out Verified patient identification, verified procedure, site/side was marked, verified correct patient position, special equipment/implants available, medications/allergies/relevant history reviewed, required imaging and test results available.   Sterile Technique Usual hand hygiene, masks, gowns, and gloves were used   Procedure Description Bronchoscope advanced through endotracheal tube and into airway.  Airways were examined down to subsegmental level with findings noted below.   Following diagnostic evaluation, Therapeutic aspiration performed in right lower lobe/ left lower lobe  Findings: Large amount of thick blood clots located in the right main bronchus and left lower lobe with thick mucus plugging, no active bleeding noted   Complications/Tolerance None; patient tolerated the procedure well. Chest X-ray is not needed post procedure.   EBL Minimal   Specimen(s)

## 2022-05-21 NOTE — Progress Notes (Signed)
[facility] Progress Note Patient Name: Frank Shelton. DOB: 10/09/1960 MRN: [id number]   Date of Service  05/21/2022  HPI/Events of Note  Multiple issues: 1. ABG on 60%/PRVC 15/TV 690/P 5 = 7.315/46.1/71/23.4. 2. AFIB with RVR - Ventricular rate = 147.  eICU Interventions  Plan: Increase PEEP to 8. Rebolus with Amiodarone 150 mg IV over 10 minutes. (Third bolus).     Intervention Category Major Interventions: Respiratory failure - evaluation and management;Arrhythmia - evaluation and management  Leigh Kaeding Eugene 05/21/2022, 3:53 AM

## 2022-05-21 NOTE — Progress Notes (Signed)
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 but missed a few doses over xmas and presented 12/28 with recurrent PE in setting of  DVT >L> R on Dopplers with RV/LV 1.2 no change from CT 14-Jun-2020 and echo ok but coded am 2023/06/10 and transferred to ICU where  given Altepase empirically and noted to have R groin hematoma at sight of R CVL during code with one arterial stick and hgb dropping therefafter with neg GI source to date but all anticoagulation held and PCCM service asked to see pending transfer to [facility] when bed available with worsening uop/increasing K and creat developing while still on pressors and full vent support.   Pertinent  Medical History  obesity, T2DM, HTN, gout, COPD, hyperlipidemia. He also has a history of previous DVT/PE on xarelto.   Significant Hospital Events: Including procedures, antibiotic start and stop dates in addition to other pertinent events   10-Jun-2023: Cardiac arrest while in BR. CPR  initiated. TNK administered during CPR, fem cvl  placed/ post arrest shock, high dose pressors. Failed attempt on right femoral and right IJ access. Time to ROSC estimated over 30 minutes. Hgb dropped 12 to 8.8  12/31 on 3 pressors and stress dose steroids. In DKA. Insulin gtt started. LMWH placed on hold.  MRSA PCR screen Jun 10, 2023 > neg  BC x 2 1/1  > one /two pos gpc + staph hominis. Unasyn started.  1/3 transferred to [facility] central venous catheter removed.  Blood cultures repeated.  Repeat echo: EF 50 to 55% no regional wall abnormality grade 1 diastolic dysfunction RV normal Renal function improving with Lasix.  Weaning pressors.  Adding Seroquel and clonazepam to assist with weaning   Interim History / Subjective:  Patient started with thick bloody secretion via ET  tube FiO2 requirement went up to 80% Went into A-fib with RVR, requiring multiple doses of amiodarone boluses followed by infusion  Objective   Blood pressure (!) 80/53, pulse (!) 197, temperature 99 F (37.2 C), temperature source Oral, resp. rate 15, height 6\' 4"  (1.93 m), weight (!) 184.8 kg, SpO2 97 %.    Vent Mode: PRVC FiO2 (%):  [50 %-100 %] 100 % Set Rate:  [15 bmp] 15 bmp Vt Set:  [690 mL] 690 mL PEEP:  [5 cmH20-8 cmH20] 8 cmH20 Plateau Pressure:  [16 cmH20-28 cmH20] 21 cmH20   Intake/Output Summary (Last 24 hours) at 05/21/2022 1227 Last data filed at 05/21/2022 1200 Gross per 24 hour  Intake 2297.37 ml  Output 6500 ml  Net -4202.63 ml   Filed Weights   05/18/22 0444 06/14/2022 0500 05/21/22 0500  Weight: (!) 179.2 kg (!) 185.4 kg (!) 184.8 kg    Examination:   Physical exam: General: Crtitically ill-appearing morbidly obese male, orally intubated HEENT: [place]/AT, eyes anicteric.  ETT and OGT in place Neuro: Sedated, not following commands.  Eyes are closed.  Pupils 3 mm bilateral reactive to light.  Positive cough and gag Chest: Coarse breath sounds, no wheezes or rhonchi.  Thick bloody secretions noted via ET tube Heart: Irregularly irregular, tachycardic, no murmurs or gallops Abdomen: Soft, nondistended, bowel sounds present Skin: No rash  Assessment & Plan:  Septic shock likely due to aspiration pneumonia Patient continues to require vasopressor support with  Levophed Repeat x-ray chest showed worsening bilateral infiltrates Respiratory culture is growing gram-negative rods Continue IV Unasyn, follow-up sensitivity results and adjust antibiotic accordingly Restarted back on trickle tube feeds Titrate vasopressors for map goal 65  S/p PEA cardiac arrest likely in the setting of massive PE Patient went into asystolic cardiac arrest, as he presented with acute PE it was suspected patient had massive PE at that time, he received TNK Continue supportive care  Acute  HFpEF with pulmonary edema Continue aggressive diuresis Patient remains net -4 L Monitor intake and output Daily weight  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  Patient is s/p IVC filter placement After receiving TNK he had groin hematoma and bleeding from oral cavity He was started on heparin yesterday but this morning started with big clots coming via ET tube Also noted to have fresh bleeding via ET tube Heparin was stopped again H&H remained stable  Acute hypoxic respiratory failure due to aspiration pneumonia Underlying COPD and OSA Continuing full ventilator support VAP prevention bundle in place FiO2 and PEEP requirement went up, currently on 100% FiO2 and PEEP at 8 Continuing aggressive diuresis and antibiotics to treat pneumonia PAD protocol midazolam and fentanyl infusion with RASS goal -2  Acute metabolic/septic encephalopathy Patient is deeply sedated now with Versed and fentanyl infusion  Staph Hominis bacteremia Procalcitonin continues to improve  Repeat blood cultures have been negative Continue Unasyn Curb sided with infectious disease, no need for TEE  Acute kidney injury due to ischemic ATN in setting of cardiac arrest Hyperkalemia Hypocalcemia Serum creatinine remain elevated Nephrology is following Patient is diuresing very well, made 4.9 L urine yesterday BUN continue to trend up Serum potassium has corrected Continue aggressive electrolyte supplement  Acute blood loss anemia/ multiple hematoma's but no obvious ongoing Gi bleeding Continuing PPI twice daily Monitor H&H and transfuse if less than 7  Thrombocytopenia/ likely due to critical illness, resolved Platelet count went up to 190  Hypothyroidism  Continue Synthroid  Poorly controlled DM w/ severe hyperglycemia Gastroparesis Patient blood sugars are controlled now Holding off on Lantus Continue sliding scale insulin Patient continued to have large amount of NG tube output Continue NG  tube on intermittent suction Started on Reglan  Morbid obesity Dietitian is following  Best Practice (right click and "Reselect all SmartList Selections" daily)   Diet/type: NPO, rectal tube feed DVT prophylaxis: SCD GI prophylaxis: PPI Lines: Central line Foley:  Yes, and it is still needed Code Status:  DNR Last date of multidisciplinary goals of care discussion: 1/5 patient's son was updated at bedside  This patient is critically ill with multiple organ system failure which requires frequent high complexity decision making, assessment, support, evaluation, and titration of therapies. This was completed through the application of advanced monitoring technologies and extensive interpretation of multiple databases.  During this encounter critical care time was devoted to patient care services described in this note for 41 minutes.    Cheri Fowler, MD [facility] See Amion for pager If no response to pager, please call [phone] until 7pm After 7pm, Please call E-link [phone]

## 2022-05-21 NOTE — Progress Notes (Signed)
ANTICOAGULATION CONSULT NOTE   Pharmacy Consult for heparin Indication: DVT  Allergies  Allergen Reactions   Nabumetone Hives    Patient Measurements: Height: 6\' 4"  (193 cm) Weight: (!) 184.8 kg (407 lb 6.6 oz) IBW/kg (Calculated) : 86.8 HEPARIN DW (KG): 129.7   Vital Signs: Temp: 99.8 F (37.7 C) (01/06 0735) Temp Source: Oral (01/06 0735) BP: 115/68 (01/06 0615) Pulse Rate: 133 (01/06 0615)  Labs: Recent Labs    05/18/22 1656 05/18/22 1843 05/19/22 0122 05/19/22 0145 05/19/22 0606 05/20/22 8299 05/20/22 1753 05/20/22 1957 05/21/22 0311 05/21/22 0634  HGB  --   --    < > 7.6*   < > 7.9*  --   --  7.8* 8.0*  HCT  --   --    < > 22.7*   < > 24.6*  --   --  23.0* 24.6*  PLT  --   --   --  90*  --  129*  --   --   --  190  HEPARINUNFRC  --   --   --   --   --   --  <0.10*  --   --  0.29*  CREATININE  --   --   --  8.91*  --  9.21*  --  9.62*  --  9.50*  TROPONINIHS 116* 125*  --   --   --   --   --   --   --   --    < > = values in this interval not displayed.     Estimated Creatinine Clearance: 14.2 mL/min (A) (by C-G formula based on SCr of 9.5 mg/dL (H)).   Medical History: Past Medical History:  Diagnosis Date   Arthritis    L shoulder & fingers    Bronchitis    during enviromental changes    Complication of anesthesia    Report of airway problem   Diabetes mellitus without complication (HCC)    Dyspnea    Embolism - blood clot    Gout    Headache    uses ibuprofen & tylenol & its relieved    Hypertension    Hypothyroidism    Myocardial infarction ([place]) 2012   OSA on CPAP    Peripheral vascular disease ([place])    Pulmonary embolism ([place])    Stroke ([place]) 2014   Assessment: 62 yo male with recurrent PE/DVT on xarelto PTA for history of PE/DVT. He is s/p TNK on 12/30 followed by hemorrhagic shock from right groin hematoma.  Heparin was started conservatively with no bolus -hg= 9, plt= 190, stable  Heparin level just under therapeutic range at  0.29 on 1700 units/hr. Significant bleeding noted via bronchoscopy 05/21/22. Per discussion with CCM, will hold heparin today.  Goal of Therapy:  Heparin level 0.3-0.5 units/ml Monitor platelets by anticoagulation protocol: Yes   Plan:  Hold heparin per discussion with CCM. Daily heparin level and CBC  Thank you for allowing pharmacy to participate in this patient's care.  Reatha Harps, PharmD PGY2 Pharmacy Resident 05/21/2022 8:26 AM Check [email or web address] for unit specific pharmacy number

## 2022-05-21 NOTE — Progress Notes (Signed)
[facility] Progress Note Patient Name: Frank Shelton. DOB: 17-Jul-1960 MRN: [id number]   Date of Service  05/21/2022  HPI/Events of Note  HR 140's Patient is in A fib RVR per EKG. On amio gtt at 39.   Levo is now max at 10 PIV dose. BP 80/47 (59) Only has PIVs  Also has a lasix IVPB dose due at 2200 asking if this should be administered  UP 2-3 liters during dayshift and has been at 150 cc/hr  eICU Interventions  Discussed with RN to hold due Lasix dose Give albumin 25% 12.5 g If persistently requiring pressors will need to re-insert central line     Intervention Category Intermediate Interventions: Hypotension - evaluation and management;Arrhythmia - evaluation and management  Judd Lien 05/21/2022, 10:59 PM

## 2022-05-21 NOTE — Procedures (Signed)
Bedside Bronchoscopy Procedure Note Sanders Manninen [id number] November 17, 1960  Procedure: Bronchoscopy Indications: Diagnostic evaluation of the airways and Remove secretions  Procedure Details: ET Tube Size: 7.5 ET Tube secured at lip (cm): 25 Bite block in place: Yes In preparation for procedure, Patient hyper-oxygenated with 100 % FiO2 and Saline given via ETT (30 ml) Airway entered and the following bronchi were examined: RUL, RML, RLL, LUL, LLL, and Bronchi.   Bronchoscope removed. Patient remained on 100% FiO2 at conclusion of procedure.    Evaluation BP 115/68   Pulse (!) 133   Temp 99.8 F (37.7 C) (Oral)   Resp 15   Ht 6\' 4"  (1.93 m)   Wt (!) 184.8 kg   SpO2 95%   BMI 49.59 kg/m  Breath Sounds:Rhonch O2 sats: stable throughout Patient's Current Condition: stable Specimens:  None Complications: No apparent complications Patient did tolerate procedure well.   Kathie Dike 05/21/2022, 8:53 AM

## 2022-05-21 NOTE — Progress Notes (Signed)
Admit: 05/12/2022 LOS: 7  35M with recurrent PE s/p cardiac arrest, s/p TNK; shock, VDRF  Subjective:  Ongoing great UOP on twice daily 160 mg IV Lasix, 4.9 L with -3.9 L yesterday Serum creatinine stubbornly unchanged at 9.5 with BUN 137, K4.3 Continues on full vent support and high FiO2 On norepinephrine A-fib with RVR overnight  01/05 0701 - 01/06 0700 In: 2703.3 [I.V.:2196; NG/GT:507.3] Out: 6690 [Urine:4890; Emesis/NG output:1800]  Filed Weights   05/18/22 0444 05/19/22 0500 05/21/22 0500  Weight: (!) 179.2 kg (!) 185.4 kg (!) 184.8 kg    Scheduled Meds:  sodium chloride   Intravenous Once   artificial tears  1 Application Both Eyes A2Z   budesonide (PULMICORT) nebulizer solution  0.5 mg Nebulization BID   Chlorhexidine Gluconate Cloth  6 each Topical Daily   clonazePAM  2 mg Per Tube TID   dextrose  1 ampule Intravenous Once   docusate  100 mg Per Tube BID   feeding supplement (PROSource TF20)  60 mL Per Tube Q4H   fentaNYL (SUBLIMAZE) injection  50 mcg Intravenous Once   insulin aspart  3-9 Units Subcutaneous Q4H   ipratropium-albuterol  3 mL Nebulization QID   lactulose  20 g Per Tube TID   levothyroxine  200 mcg Per Tube QAC breakfast   metoCLOPramide (REGLAN) injection  10 mg Intravenous Q6H   multivitamin  1 tablet Per Tube Daily   mouth rinse  15 mL Mouth Rinse Q2H   pantoprazole (PROTONIX) IV  40 mg Intravenous Q12H   polyethylene glycol  17 g Per Tube BID   QUEtiapine  100 mg Per Tube BID   Continuous Infusions:  sodium chloride Stopped (05/19/22 1340)   sodium chloride 10 mL/hr at 05/21/22 0600   amiodarone 30 mg/hr (05/21/22 0600)   ampicillin-sulbactam (UNASYN) IV 3 g (05/20/22 0828)   calcium gluconate     feeding supplement (VITAL 1.5 CAL) 10 mL/hr at 05/21/22 0600   fentaNYL infusion INTRAVENOUS 250 mcg/hr (05/21/22 0600)   furosemide 160 mg (05/20/22 2205)   midazolam 3 mg/hr (05/21/22 0600)   norepinephrine (LEVOPHED) Adult infusion 6 mcg/min  (05/21/22 0600)   PRN Meds:.acetaminophen **OR** acetaminophen, albuterol, dextrose, fentaNYL, midazolam, mouth rinse  Current Labs: reviewed    Physical Exam:  Blood pressure 98/66, pulse (!) 204, temperature 99.8 F (37.7 C), temperature source Oral, resp. rate 15, height 6\' 4"  (1.93 m), weight (!) 184.8 kg, SpO2 95 %. Obese, intubated, sedated Regular ,normal rate Coarse bs b/l ETT in place  Foley in place  Chronic hyperpigmentation of LEs b/l, 2+ edema present S/nt  A Severe AKI, normal GFR at baseline; now nonoliguric and lasix responsive has not yet req RRT Recurernt PE s/p cardiac arrest, shock on pressors AHRF/VDRF, hx/o COPD and OSA ? Aspiration PNA / HCAP on unasyn S hominis bacteremia on unasyn DM2 Hypocalcemia, repleted by CCM Anemia, no role for ESA, transfuse prn  P UOP suggests will be recovering GFR but lab work not yet consistent with that Still no indication for RRT, hopefully he will avoid; if problems with hyperkalemia or transitions to oliguria would offer RRT Continue Lasix today We will follow closely Medication Issues; Preferred narcotic agents for pain control are hydromorphone, fentanyl, and methadone. Morphine should not be used.  Baclofen should be avoided Avoid oral sodium phosphate and magnesium citrate based laxatives / bowel preps    [name] MD 05/21/2022, 9:46 AM  Recent Labs  Lab 05/15/22 3086 05/15/22 5784 05/20/22 6962 05/20/22 9528  05/20/22 1957 05/21/22 0311 05/21/22 0634  NA 137   < > 139  --  141 141 142  K 3.6   < > 4.2   < > 4.1 4.0 4.3  CL 102   < > 102  --  102  --  100  CO2 18*   < > 20*  --  21*  --  22  GLUCOSE 379*   < > 170*  --  145*  --  145*  BUN 33*   < > 130*  --  134*  --  137*  CREATININE 4.09*   < > 9.21*  --  9.62*  --  9.50*  CALCIUM 6.3*   < > 6.6*  --  6.5*  --  6.4*  PHOS 5.2*  --   --   --   --   --   --    < > = values in this interval not displayed.    Recent Labs  Lab 05/19/22 0145  05/19/22 0606 05/20/22 0627 05/21/22 0311 05/21/22 0634  WBC 12.7*  --  12.2*  --  15.2*  NEUTROABS  --   --  10.5*  --   --   HGB 7.6*   < > 7.9* 7.8* 8.0*  HCT 22.7*   < > 24.6* 23.0* 24.6*  MCV 90.4  --  94.6  --  95.0  PLT 90*  --  129*  --  190   < > = values in this interval not displayed.

## 2022-05-22 ENCOUNTER — Inpatient Hospital Stay (HOSPITAL_COMMUNITY): Payer: Medicare HMO

## 2022-05-22 DIAGNOSIS — R579 Shock, unspecified: Secondary | ICD-10-CM | POA: Diagnosis not present

## 2022-05-22 DIAGNOSIS — I2694 Multiple subsegmental pulmonary emboli without acute cor pulmonale: Secondary | ICD-10-CM | POA: Diagnosis not present

## 2022-05-22 DIAGNOSIS — J9601 Acute respiratory failure with hypoxia: Secondary | ICD-10-CM | POA: Diagnosis not present

## 2022-05-22 DIAGNOSIS — N179 Acute kidney failure, unspecified: Secondary | ICD-10-CM | POA: Diagnosis not present

## 2022-05-22 DIAGNOSIS — J9602 Acute respiratory failure with hypercapnia: Secondary | ICD-10-CM | POA: Diagnosis not present

## 2022-05-22 LAB — BASIC METABOLIC PANEL
Anion gap: 19 — ABNORMAL HIGH (ref 5–15)
BUN: 136 mg/dL — ABNORMAL HIGH (ref 8–23)
CO2: 22 mmol/L (ref 22–32)
Calcium: 6.5 mg/dL — ABNORMAL LOW (ref 8.9–10.3)
Chloride: 101 mmol/L (ref 98–111)
Creatinine, Ser: 9.46 mg/dL — ABNORMAL HIGH (ref 0.61–1.24)
GFR, Estimated: 6 mL/min — ABNORMAL LOW (ref >60)
Glucose, Bld: 164 mg/dL — ABNORMAL HIGH (ref 70–99)
Potassium: 4.2 mmol/L (ref 3.5–5.1)
Sodium: 142 mmol/L (ref 135–145)

## 2022-05-22 LAB — COOXEMETRY PANEL
Carboxyhemoglobin: 1.8 % — ABNORMAL HIGH (ref 0.5–1.5)
Methemoglobin: <0.7 % (ref 0.0–1.5)
O2 Saturation: 68.6 %
Total hemoglobin: 8.1 g/dL — ABNORMAL LOW (ref 12.0–16.0)

## 2022-05-22 LAB — CBC
HCT: 24.7 % — ABNORMAL LOW (ref 39.0–52.0)
Hemoglobin: 7.8 g/dL — ABNORMAL LOW (ref 13.0–17.0)
MCH: 30.4 pg (ref 26.0–34.0)
MCHC: 31.6 g/dL (ref 30.0–36.0)
MCV: 96.1 fL (ref 80.0–100.0)
Platelets: 191 10*3/uL (ref 150–400)
RBC: 2.57 MIL/uL — ABNORMAL LOW (ref 4.22–5.81)
RDW: 20.1 % — ABNORMAL HIGH (ref 11.5–15.5)
WBC: 16.3 10*3/uL — ABNORMAL HIGH (ref 4.0–10.5)
nRBC: 1.7 % — ABNORMAL HIGH (ref 0.0–0.2)

## 2022-05-22 LAB — GLUCOSE, CAPILLARY
Glucose-Capillary: 146 mg/dL — ABNORMAL HIGH (ref 70–99)
Glucose-Capillary: 152 mg/dL — ABNORMAL HIGH (ref 70–99)
Glucose-Capillary: 154 mg/dL — ABNORMAL HIGH (ref 70–99)
Glucose-Capillary: 186 mg/dL — ABNORMAL HIGH (ref 70–99)
Glucose-Capillary: 189 mg/dL — ABNORMAL HIGH (ref 70–99)
Glucose-Capillary: 220 mg/dL — ABNORMAL HIGH (ref 70–99)

## 2022-05-22 LAB — PHOSPHORUS: Phosphorus: 7.9 mg/dL — ABNORMAL HIGH (ref 2.5–4.6)

## 2022-05-22 LAB — MAGNESIUM: Magnesium: 2 mg/dL (ref 1.7–2.4)

## 2022-05-22 MED ORDER — ETOMIDATE 2 MG/ML IV SOLN: 20.0000 mg | Freq: Once | INTRAVENOUS | Status: AC

## 2022-05-22 MED ORDER — NOREPINEPHRINE 16 MG/250ML-% IV SOLN
0.0000 ug/min | INTRAVENOUS | Status: DC
  Administered 2022-05-22: 3 ug/min via INTRAVENOUS
  Administered 2022-05-23: 25 ug/min via INTRAVENOUS
  Administered 2022-05-23: 23 ug/min via INTRAVENOUS
  Administered 2022-05-24: 24 ug/min via INTRAVENOUS
  Administered 2022-05-24: 10 ug/min via INTRAVENOUS
  Filled 2022-05-22 (×6): qty 250

## 2022-05-22 MED ORDER — SODIUM CHLORIDE 0.9 % IV SOLN: 3.0000 g | Freq: Two times a day (BID) | INTRAVENOUS | Status: DC

## 2022-05-22 MED ORDER — CALCIUM GLUCONATE-NACL 2-0.675 GM/100ML-% IV SOLN
2.0000 g | Freq: Once | INTRAVENOUS | Status: AC
  Administered 2022-05-22: 2000 mg via INTRAVENOUS
  Filled 2022-05-22: qty 100

## 2022-05-22 MED ORDER — ETOMIDATE 2 MG/ML IV SOLN
INTRAVENOUS | Status: AC
  Administered 2022-05-22: 20 mg via INTRAVENOUS
  Filled 2022-05-22: qty 10

## 2022-05-22 MED ORDER — SORBITOL 70 % SOLN
960.0000 mL | TOPICAL_OIL | Freq: Once | ORAL | Status: AC
  Administered 2022-05-22: 960 mL via RECTAL
  Filled 2022-05-22: qty 240

## 2022-05-22 MED ORDER — SORBITOL 70 % SOLN
30.0000 mL | Freq: Once | Status: AC
  Administered 2022-05-22: 30 mL
  Filled 2022-05-22: qty 30

## 2022-05-22 MED ORDER — ROCURONIUM BROMIDE 10 MG/ML (PF) SYRINGE
PREFILLED_SYRINGE | INTRAVENOUS | Status: AC
  Administered 2022-05-22: 100 mg via INTRAVENOUS
  Filled 2022-05-22: qty 10

## 2022-05-22 MED ORDER — DIGOXIN 0.25 MG/ML IJ SOLN
0.2500 mg | Freq: Once | INTRAMUSCULAR | Status: AC
  Administered 2022-05-22: 0.25 mg via INTRAVENOUS
  Filled 2022-05-22: qty 2

## 2022-05-22 MED ORDER — ROCURONIUM BROMIDE 50 MG/5ML IV SOLN
100.0000 mg | Freq: Once | INTRAVENOUS | Status: AC
  Filled 2022-05-22: qty 10

## 2022-05-22 MED ORDER — METOCLOPRAMIDE HCL 5 MG/ML IJ SOLN
5.0000 mg | Freq: Four times a day (QID) | INTRAMUSCULAR | Status: DC
  Administered 2022-05-22 – 2022-06-09 (×65): 5 mg via INTRAVENOUS
  Filled 2022-05-22 (×65): qty 2

## 2022-05-22 MED ORDER — SODIUM CHLORIDE 0.9 % IV SOLN
3.0000 g | INTRAVENOUS | Status: DC
  Administered 2022-05-22: 3 g via INTRAVENOUS
  Filled 2022-05-22: qty 8

## 2022-05-22 MED ORDER — SENNOSIDES 8.8 MG/5ML PO SYRP
15.0000 mL | ORAL_SOLUTION | Freq: Two times a day (BID) | ORAL | Status: DC
  Administered 2022-05-22 – 2022-05-28 (×9): 15 mL
  Filled 2022-05-22 (×11): qty 15

## 2022-05-22 NOTE — Progress Notes (Signed)
Admit: 05/12/2022 LOS: 8  17M with recurrent PE s/p cardiac arrest, s/p TNK; shock, VDRF  Subjective:  Ongoing great UOP on twice daily 160 mg IV Lasix (yesterday evening dose was held because of low blood pressure), 4.4 L with -3.6 L yesterday Weights are likely trending down but hard to interpret Serum creatinine and BUN essentially unchanged, K4.2 Continues on full vent support and high FiO2 On norepinephrine A-fib with RVR ongoing  01/06 0701 - 01/07 0700 In: 3117.7 [I.V.:2018; NG/GT:1060; IV Piggyback:39.7] Out: 4259 [Urine:4435; Emesis/NG output:2250]  Filed Weights   05/19/22 0500 05/21/22 0500 05/22/22 0500  Weight: (!) 185.4 kg (!) 184.8 kg (!) 172.1 kg    Scheduled Meds:  sodium chloride   Intravenous Once   artificial tears  1 Application Both Eyes Q8H   budesonide (PULMICORT) nebulizer solution  0.5 mg Nebulization BID   Chlorhexidine Gluconate Cloth  6 each Topical Daily   clonazePAM  2 mg Per Tube TID   dextrose  1 ampule Intravenous Once   docusate  100 mg Per Tube BID   feeding supplement (PROSource TF20)  60 mL Per Tube Q4H   fentaNYL (SUBLIMAZE) injection  50 mcg Intravenous Once   insulin aspart  3-9 Units Subcutaneous Q4H   ipratropium  0.5 mg Nebulization Q6H   lactulose  20 g Per Tube TID   levalbuterol  0.63 mg Nebulization Q6H   levothyroxine  200 mcg Per Tube QAC breakfast   multivitamin  1 tablet Per Tube Daily   mouth rinse  15 mL Mouth Rinse Q2H   pantoprazole (PROTONIX) IV  40 mg Intravenous Q12H   polyethylene glycol  17 g Per Tube BID   QUEtiapine  100 mg Per Tube BID   Continuous Infusions:  sodium chloride Stopped (05/19/22 1340)   sodium chloride Stopped (05/22/22 0026)   amiodarone 30 mg/hr (05/22/22 0859)   ampicillin-sulbactam (UNASYN) IV 3 g (05/22/22 0911)   feeding supplement (VITAL 1.5 CAL) 10 mL/hr at 05/22/22 0800   fentaNYL infusion INTRAVENOUS 250 mcg/hr (05/22/22 0800)   furosemide Stopped (05/21/22 2305)   midazolam 2  mg/hr (05/22/22 0800)   norepinephrine (LEVOPHED) Adult infusion 10 mcg/min (05/22/22 0800)   PRN Meds:.acetaminophen **OR** acetaminophen, albuterol, dextrose, fentaNYL, midazolam, mouth rinse  Current Labs: reviewed    Physical Exam:  Blood pressure 104/60, pulse (!) 116, temperature 99.2 F (37.3 C), temperature source Axillary, resp. rate 15, height 6\' 4"  (1.93 m), weight (!) 172.1 kg, SpO2 98 %. Obese, intubated, sedated Regular ,normal rate Coarse bs b/l ETT in place  Foley in place  Chronic hyperpigmentation of LEs b/l, 2-3+ edema present S/nt  A Severe AKI, normal GFR at baseline; now nonoliguric and lasix responsive has not yet req RRT but remains possible Recurernt PE s/p cardiac arrest, shock on pressors AHRF/VDRF, hx/o COPD and OSA; Likely VAP with GNRs per CCM ? Aspiration PNA / HCAP on unasyn S hominis bacteremia on unasyn DM2 Hypocalcemia, repleted by CCM Anemia, no role for ESA, transfuse prn Persistent A-fib with RVR on amiodarone  P UOP suggests will be recovering GFR but labwork not yet consistent with that Still no indication for RRT, hopefully he will avoid; if problems with hyperkalemia or transitions to oliguria would offer CRRT Continue Lasix today We will follow closely Medication Issues; Preferred narcotic agents for pain control are hydromorphone, fentanyl, and methadone. Morphine should not be used.  Baclofen should be avoided Avoid oral sodium phosphate and magnesium citrate based laxatives / bowel preps  [name] MD 05/22/2022, 9:25 AM  Recent Labs  Lab 05/20/22 1957 05/21/22 0311 05/21/22 0634 05/22/22 0144  NA 141 141 142 142  K 4.1 4.0 4.3 4.2  CL 102  --  100 101  CO2 21*  --  22 22  GLUCOSE 145*  --  145* 164*  BUN 134*  --  137* 136*  CREATININE 9.62*  --  9.50* 9.46*  CALCIUM 6.5*  --  6.4* 6.5*  PHOS  --   --   --  7.9*    Recent Labs  Lab 05/20/22 0627 05/21/22 0311 05/21/22 0634 05/22/22 0144  WBC 12.2*  --   15.2* 16.3*  NEUTROABS 10.5*  --   --   --   HGB 7.9* 7.8* 8.0* 7.8*  HCT 24.6* 23.0* 24.6* 24.7*  MCV 94.6  --  95.0 96.1  PLT 129*  --  190 191

## 2022-05-22 NOTE — Progress Notes (Signed)
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 but missed a few doses over xmas and presented 12/28 with recurrent PE in setting of  DVT >L> R on Dopplers with RV/LV 1.2 no change from CT 06/16/2020 and echo ok but coded am 11-Jun-2023 and transferred to ICU where  given Altepase empirically and noted to have R groin hematoma at sight of R CVL during code with one arterial stick and hgb dropping therefafter with neg GI source to date but all anticoagulation held and PCCM service asked to see pending transfer to [facility] when bed available with worsening uop/increasing K and creat developing while still on pressors and full vent support.   Pertinent  Medical History  obesity, T2DM, HTN, gout, COPD, hyperlipidemia. He also has a history of previous DVT/PE on xarelto.   Significant Hospital Events: Including procedures, antibiotic start and stop dates in addition to other pertinent events   06/11/2023: Cardiac arrest while in BR. CPR  initiated. TNK administered during CPR, fem cvl  placed/ post arrest shock, high dose pressors. Failed attempt on right femoral and right IJ access. Time to ROSC estimated over 30 minutes. Hgb dropped 12 to 8.8  12/31 on 3 pressors and stress dose steroids. In DKA. Insulin gtt started. LMWH placed on hold.  MRSA PCR screen 2023-06-11 > neg  BC x 2 1/1  > one /two pos gpc + staph hominis. Unasyn started.  1/3 transferred to [facility] central venous catheter removed.  Blood cultures repeated.  Repeat echo: EF 50 to 55% no regional wall abnormality grade 1 diastolic dysfunction RV normal Renal function improving with Lasix.  Weaning pressors.  Adding Seroquel and clonazepam to assist with weaning   Interim History / Subjective:  Patient continued to have bleeding secretion  through ET tube  Remain in A-fib with RVR, rate goes up to 150s despite being on amiodarone infusion and multiple boluses  FiO2 requirement is coming down  Objective   Blood pressure (!) 126/103, pulse (!) 157, temperature 99.5 F (37.5 C), temperature source Axillary, resp. rate 15, height 6\' 4"  (1.93 m), weight (!) 172.1 kg, SpO2 97 %.    Vent Mode: PRVC FiO2 (%):  [60 %-80 %] 60 % Set Rate:  [15 bmp] 15 bmp Vt Set:  [690 mL] 690 mL PEEP:  [8 cmH20] 8 cmH20 Plateau Pressure:  [21 cmH20-27 cmH20] 23 cmH20   Intake/Output Summary (Last 24 hours) at 05/22/2022 1146 Last data filed at 05/22/2022 1100 Gross per 24 hour  Intake 3193.3 ml  Output 6715 ml  Net -3521.7 ml   Filed Weights   05/19/22 0500 05/21/22 0500 05/22/22 0500  Weight: (!) 185.4 kg (!) 184.8 kg (!) 172.1 kg    Examination: Physical exam: General: Crtitically ill-appearing morbidly obese male, orally intubated HEENT: [place]/AT, eyes anicteric.  ETT with bloody secretions.  OGT on low intermittent suction, cortrak in place Neuro: Sedated, not following commands.  Eyes are closed.  Pupils 3 mm bilateral reactive to light.  Positive pupillary, cough and gag Chest: Coarse breath sounds, no wheezes or rhonchi Heart: Irregularly irregular, tachycardic, no murmurs or gallops Abdomen: Soft, nondistended, bowel sounds present Skin: No rash  Assessment & Plan:  Shock, combination septic and cardiogenic  Patient continues to require vasopressor  support with Levophed, currently at 10 mics Repeat x-ray chest showed worsening bilateral infiltrates could be related to pneumonia versus pulmonary edema Respiratory culture is growing gram-negative rods, still sensitivities pending Continue IV Unasyn, follow-up sensitivity results and adjust antibiotic accordingly Titrate vasopressors for map goal 65 Place central line and often Coox  S/p PEA cardiac arrest likely in the setting of massive PE Now with bloody secretions from ET  tube Patient went into asystolic cardiac arrest, as he presented with acute PE it was suspected patient probably had massive PE at that time, he received TNK Continue supportive care  Acute HFpEF with pulmonary edema Continue aggressive diuresis Making good amount of urine Weight is down significantly Patient remains net - 3.5 L Monitor intake and output Daily weight  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  Patient is s/p IVC filter placement After receiving TNK he had groin hematoma and bleeding from oral cavity Now bloody secretions from ETT He was started on heparin for short course with increased bleeding from ET tube, bronchoscopy was done which showed no active extravasation Heparin is on hold Will try to restart heparin by tomorrow to see if he can tolerate  Acute hypoxic respiratory failure due to aspiration pneumonia Underlying COPD and OSA Aspiration pneumonia Continuing full ventilator support VAP prevention bundle in place After bronchoscopy and suctioning, his FiO2 requirement is improving Currently at 70% FiO2 and PEEP of 8 Continuing aggressive diuresis and antibiotics to treat pneumonia with Unasyn to complete 10 days therapy PAD protocol midazolam and fentanyl infusion with RASS goal -2  Acute metabolic/septic encephalopathy Patient is deeply sedated now with Versed and fentanyl infusion  Staph Hominis bacteremia Procalcitonin continues to improve  Repeat blood cultures have been negative Continue Unasyn Curb sided with infectious disease, no need for TEE  Acute kidney injury due to ischemic ATN in setting of cardiac arrest Hyperkalemia Hypocalcemia Serum creatinine remain elevated Nephrology is following, no need for CRRT as of now Patient is diuresing very well, made 4.4 L urine yesterday BUN and creatinine remain stable but elevated Serum potassium has corrected Continue aggressive electrolyte supplement  Acute blood loss anemia/ multiple  hematoma's but no obvious ongoing Gi bleeding Continuing PPI twice daily Monitor H&H and transfuse if less than 7  Thrombocytopenia/ likely due to critical illness, resolved Platelet counts are stable around 190  Hypothyroidism  Continue Synthroid  Poorly controlled DM w/ severe hyperglycemia Gastroparesis Patient blood sugars are controlled now Holding off on Lantus Continue sliding scale insulin Patient continued to have large amount of NG tube output Continue NG tube on intermittent suction, he put out 2.2 L via OG output Continue Reglan  Morbid obesity Dietitian is following  Constipation Patient had no bowel movement for last 8 days Will give him enema  Best Practice (right click and "Reselect all SmartList Selections" daily)   Diet/type: NPO, trickle tube feed DVT prophylaxis: SCD GI prophylaxis: PPI Lines: Central line Foley:  Yes, and it is still needed Code Status:  DNR Last date of multidisciplinary goals of care discussion: 1/5 patient's son was updated at bedside  This patient is critically ill with multiple organ system failure which requires frequent high complexity decision making, assessment, support, evaluation, and titration of therapies. This was completed through the application of advanced monitoring technologies and extensive interpretation of multiple databases.  During this encounter critical care time was devoted to patient care services described in this note for 38 minutes.    Jacky Kindle, MD [facility]  Critical Care See Amion for pager If no response to pager, please call [phone] until 7pm After 7pm, Please call E-link [phone]

## 2022-05-22 NOTE — Progress Notes (Signed)
ET tube exchange performed with Dr Tacy Learn to perform bronchoscopy with larger bronchoscope. ET tube changed to an 8.5 from a 7.5.

## 2022-05-22 NOTE — Procedures (Signed)
Intubation Procedure Note  [name]  [id number]  03-19-1961  Date:05/22/22  Time:4:46 PM   Provider Performing:Avianah Pellman    Procedure: Intubation (31500)  Indication(s) Respiratory Failure  Consent Risks of the procedure as well as the alternatives and risks of each were explained to the patient and/or caregiver.  Consent for the procedure was obtained and is signed in the bedside chart   Anesthesia Etomidate and Rocuronium   Time Out Verified patient identification, verified procedure, site/side was marked, verified correct patient position, special equipment/implants available, medications/allergies/relevant history reviewed, required imaging and test results available.   Sterile Technique Usual hand hygeine, masks, and gloves were used   Procedure Description Patient positioned in bed supine.  Sedation given as noted above.  Patient was intubated with endotracheal tube using Glidescope.  View was Grade 1 full glottis .  Number of attempts was  2 .  Colorimetric CO2 detector was consistent with tracheal placement.   Complications/Tolerance None; patient tolerated the procedure well. Chest X-ray is ordered to verify placement.   EBL Minimal   Specimen(s) None

## 2022-05-22 NOTE — Progress Notes (Signed)
ANTICOAGULATION CONSULT NOTE   Pharmacy Consult for heparin Indication: DVT  Allergies  Allergen Reactions   Nabumetone Hives    Patient Measurements: Height: 6\' 4"  (193 cm) Weight: (!) 172.1 kg (379 lb 6.6 oz) IBW/kg (Calculated) : 86.8 HEPARIN DW (KG): 129.7   Vital Signs: Temp: 99.2 F (37.3 C) (01/07 0730) Temp Source: Axillary (01/07 0730) BP: 112/54 (01/07 1045) Pulse Rate: 144 (01/07 1045)  Labs: Recent Labs    05/20/22 0627 05/20/22 1753 05/20/22 1957 05/21/22 0311 05/21/22 0634 05/22/22 0144  HGB 7.9*  --   --  7.8* 8.0* 7.8*  HCT 24.6*  --   --  23.0* 24.6* 24.7*  PLT 129*  --   --   --  190 191  HEPARINUNFRC  --  <0.10*  --   --  0.29*  --   CREATININE 9.21*  --  9.62*  --  9.50* 9.46*     Estimated Creatinine Clearance: 13.7 mL/min (A) (by C-G formula based on SCr of 9.46 mg/dL (H)).   Medical History: Past Medical History:  Diagnosis Date   Arthritis    L shoulder & fingers    Bronchitis    during enviromental changes    Complication of anesthesia    Report of airway problem   Diabetes mellitus without complication (HCC)    Dyspnea    Embolism - blood clot    Gout    Headache    uses ibuprofen & tylenol & its relieved    Hypertension    Hypothyroidism    Myocardial infarction ([place]) 2012   OSA on CPAP    Peripheral vascular disease ([place])    Pulmonary embolism ([place])    Stroke ([place]) 2014   Assessment: 62 yo male with recurrent PE/DVT on xarelto PTA for history of PE/DVT. He is s/p TNK on 12/30 followed by hemorrhagic shock from right groin hematoma.  Heparin was started conservatively with no bolus  Hemoglobin and pltc stable from yesterday. Still having some upper airway bleeding. Discussed with CCM and will hold again today and evaluate bleeding 1/8.  Goal of Therapy:  Heparin level 0.3-0.5 units/ml Monitor platelets by anticoagulation protocol: Yes   Plan:  Hold heparin per discussion with CCM. Daily heparin level and  CBC  Thank you for allowing pharmacy to participate in this patient's care.  Reatha Harps, PharmD PGY2 Pharmacy Resident 05/22/2022 11:02 AM Check [email or web address] for unit specific pharmacy number

## 2022-05-22 NOTE — Procedures (Signed)
Central Venous Catheter Insertion Procedure Note  Frank Shelton  [id number]  [birth year]  Date:05/22/22  Time:4:48 PM   Provider Performing:Dalya Maselli   Procedure: Insertion of Non-tunneled Central Venous [phone]) with US guidance ([id number])   Indication(s) Medication administration  Consent Risks of the procedure as well as the alternatives and risks of each were explained to the patient and/or caregiver.  Consent for the procedure was obtained and is signed in the bedside chart  Anesthesia Topical only with 1% lidocaine   Timeout Verified patient identification, verified procedure, site/side was marked, verified correct patient position, special equipment/implants available, medications/allergies/relevant history reviewed, required imaging and test results available.  Sterile Technique Maximal sterile technique including full sterile barrier drape, hand hygiene, sterile gown, sterile gloves, mask, hair covering, sterile ultrasound probe cover (if used).  Procedure Description Area of catheter insertion was cleaned with chlorhexidine and draped in sterile fashion.  With real-time ultrasound guidance a central venous catheter was placed into the left subclavian vein. Nonpulsatile blood flow and easy flushing noted in all ports.  The catheter was sutured in place and sterile dressing applied.  Complications/Tolerance None; patient tolerated the procedure well. Chest X-ray is ordered to verify placement for internal jugular or subclavian cannulation.   Chest x-ray is not ordered for femoral cannulation.  EBL Minimal  Specimen(s) None

## 2022-05-22 NOTE — Procedures (Signed)
Bronchoscopy Procedure Note  Bertrum Helmstetter  [id number]  1960-07-25  Date:05/22/22  Time:4:47 PM   Provider Performing:Taleia Sadowski   Procedure(s):  Flexible Bronchoscopy [phone]) and Subsequent Therapeutic Aspiration of Tracheobronchial Tree [phone])  Indication(s) Hemoptysis  Consent Risks of the procedure as well as the alternatives and risks of each were explained to the patient and/or caregiver.  Consent for the procedure was obtained and is signed in the bedside chart  Anesthesia Etomidate and roc   Time Out Verified patient identification, verified procedure, site/side was marked, verified correct patient position, special equipment/implants available, medications/allergies/relevant history reviewed, required imaging and test results available.   Sterile Technique Usual hand hygiene, masks, gowns, and gloves were used   Procedure Description Bronchoscope advanced through endotracheal tube and into airway.  Airways were examined down to subsegmental level with findings noted below.   Following diagnostic evaluation, Therapeutic aspiration performed in right lower lobe/left lower lobe  Findings: Blood clots noted in the left lower lobe and right lower lobe, no active bleeding was received   Complications/Tolerance None; patient tolerated the procedure well. Chest X-ray is needed post procedure.   EBL Minimal

## 2022-05-22 NOTE — Procedures (Signed)
Arterial Catheter Insertion Procedure Note  Frank Shelton  [id number]  [birth year]  Date:05/22/22  Time:4:50 PM    Provider Performing: Jacky Kindle    Procedure: Insertion of Arterial Line [phone]) with US guidance ([id number])   Indication(s) Blood pressure monitoring and/or need for frequent ABGs  Consent Risks of the procedure as well as the alternatives and risks of each were explained to the patient and/or caregiver.  Consent for the procedure was obtained and is signed in the bedside chart  Anesthesia None   Time Out Verified patient identification, verified procedure, site/side was marked, verified correct patient position, special equipment/implants available, medications/allergies/relevant history reviewed, required imaging and test results available.   Sterile Technique Maximal sterile technique including full sterile barrier drape, hand hygiene, sterile gown, sterile gloves, mask, hair covering, sterile ultrasound probe cover (if used).   Procedure Description Area of catheter insertion was cleaned with chlorhexidine and draped in sterile fashion. With real-time ultrasound guidance an arterial catheter was placed into the left  Axilllary  artery.  Appropriate arterial tracings confirmed on monitor.     Complications/Tolerance None; patient tolerated the procedure well.   EBL Minimal   Specimen(s) None

## 2022-05-22 NOTE — Procedures (Signed)
Bedside Bronchoscopy Procedure Note Frank Shelton [id number] February 23, 1961  Procedure: Bronchoscopy Indications: Remove secretions  Procedure Details: ET Tube Size: 8.5 ET Tube secured at lip (cm): 25 Bite block in place: No In preparation for procedure, Patient hyper-oxygenated with 100 % FiO2 and Saline given via ETT (60 ml) Airway entered and the following bronchi were examined: RUL, RML, RLL, LUL, LLL, and Bronchi.   Bronchoscope removed. Patient on 100% FiO2 throughout procedure.    Evaluation BP (!) 86/58   Pulse (!) 140   Temp 99.5 F (37.5 C) (Axillary)   Resp 15   Ht 6\' 4"  (1.93 m)   Wt (!) 172.1 kg   SpO2 100%   BMI 46.18 kg/m  Breath Sounds:Rhonch O2 sats: stable throughout Patient's Current Condition: stable Specimens:  None Complications: No apparent complications Patient did tolerate procedure well.   Kathie Dike 05/22/2022, 2:52 PM

## 2022-05-23 DIAGNOSIS — I469 Cardiac arrest, cause unspecified: Secondary | ICD-10-CM | POA: Diagnosis not present

## 2022-05-23 LAB — BASIC METABOLIC PANEL
Anion gap: 18 — ABNORMAL HIGH (ref 5–15)
BUN: 132 mg/dL — ABNORMAL HIGH (ref 8–23)
CO2: 24 mmol/L (ref 22–32)
Calcium: 6.7 mg/dL — ABNORMAL LOW (ref 8.9–10.3)
Chloride: 101 mmol/L (ref 98–111)
Creatinine, Ser: 9.31 mg/dL — ABNORMAL HIGH (ref 0.61–1.24)
GFR, Estimated: 6 mL/min — ABNORMAL LOW (ref >60)
Glucose, Bld: 229 mg/dL — ABNORMAL HIGH (ref 70–99)
Potassium: 3.8 mmol/L (ref 3.5–5.1)
Sodium: 143 mmol/L (ref 135–145)

## 2022-05-23 LAB — GLUCOSE, CAPILLARY
Glucose-Capillary: 180 mg/dL — ABNORMAL HIGH (ref 70–99)
Glucose-Capillary: 210 mg/dL — ABNORMAL HIGH (ref 70–99)
Glucose-Capillary: 212 mg/dL — ABNORMAL HIGH (ref 70–99)
Glucose-Capillary: 215 mg/dL — ABNORMAL HIGH (ref 70–99)
Glucose-Capillary: 220 mg/dL — ABNORMAL HIGH (ref 70–99)
Glucose-Capillary: 223 mg/dL — ABNORMAL HIGH (ref 70–99)
Glucose-Capillary: 228 mg/dL — ABNORMAL HIGH (ref 70–99)

## 2022-05-23 LAB — CULTURE, BLOOD (ROUTINE X 2)
Culture: NO GROWTH
Culture: NO GROWTH
Special Requests: ADEQUATE

## 2022-05-23 LAB — CBC
HCT: 26 % — ABNORMAL LOW (ref 39.0–52.0)
Hemoglobin: 8.3 g/dL — ABNORMAL LOW (ref 13.0–17.0)
MCH: 30.4 pg (ref 26.0–34.0)
MCHC: 31.9 g/dL (ref 30.0–36.0)
MCV: 95.2 fL (ref 80.0–100.0)
Platelets: 236 10*3/uL (ref 150–400)
RBC: 2.73 MIL/uL — ABNORMAL LOW (ref 4.22–5.81)
RDW: 19.9 % — ABNORMAL HIGH (ref 11.5–15.5)
WBC: 19.2 10*3/uL — ABNORMAL HIGH (ref 4.0–10.5)
nRBC: 0.9 % — ABNORMAL HIGH (ref 0.0–0.2)

## 2022-05-23 LAB — PHOSPHORUS: Phosphorus: 6.2 mg/dL — ABNORMAL HIGH (ref 2.5–4.6)

## 2022-05-23 LAB — MAGNESIUM: Magnesium: 2.1 mg/dL (ref 1.7–2.4)

## 2022-05-23 MED ORDER — TRANEXAMIC ACID FOR INHALATION
500.0000 mg | Freq: Three times a day (TID) | RESPIRATORY_TRACT | Status: DC
  Administered 2022-05-23 (×2): 500 mg via RESPIRATORY_TRACT
  Filled 2022-05-23 (×3): qty 10

## 2022-05-23 MED ORDER — SODIUM CHLORIDE 0.9 % IV SOLN
1.0000 g | Freq: Two times a day (BID) | INTRAVENOUS | Status: AC
  Administered 2022-05-23 – 2022-05-29 (×14): 1 g via INTRAVENOUS
  Filled 2022-05-23 (×14): qty 20

## 2022-05-23 MED ORDER — METHYLNALTREXONE BROMIDE 12 MG/0.6ML ~~LOC~~ SOLN
12.0000 mg | Freq: Once | SUBCUTANEOUS | Status: AC
  Administered 2022-05-23: 12 mg via SUBCUTANEOUS
  Filled 2022-05-23: qty 0.6

## 2022-05-23 MED ORDER — AMIODARONE IV BOLUS ONLY 150 MG/100ML
150.0000 mg | Freq: Once | INTRAVENOUS | Status: AC
  Administered 2022-05-23: 150 mg via INTRAVENOUS
  Filled 2022-05-23: qty 100

## 2022-05-23 MED ORDER — MIDODRINE HCL 5 MG PO TABS
10.0000 mg | ORAL_TABLET | Freq: Three times a day (TID) | ORAL | Status: DC
  Administered 2022-05-23 – 2022-05-27 (×11): 10 mg
  Filled 2022-05-23 (×11): qty 2

## 2022-05-23 MED ORDER — SODIUM CHLORIDE 0.9 % IV SOLN
20.0000 ug | Freq: Once | INTRAVENOUS | Status: AC
  Administered 2022-05-23: 20 ug via INTRAVENOUS
  Filled 2022-05-23: qty 5

## 2022-05-23 MED ORDER — VITAL 1.5 CAL PO LIQD
1000.0000 mL | ORAL | Status: DC
  Administered 2022-05-23 – 2022-06-04 (×14): 1000 mL
  Filled 2022-05-23 (×13): qty 1000

## 2022-05-23 MED ORDER — CALCIUM GLUCONATE-NACL 2-0.675 GM/100ML-% IV SOLN
2.0000 g | Freq: Once | INTRAVENOUS | Status: AC
  Administered 2022-05-23: 2000 mg via INTRAVENOUS
  Filled 2022-05-23: qty 100

## 2022-05-23 MED ORDER — TRANEXAMIC ACID FOR INHALATION
500.0000 mg | Freq: Three times a day (TID) | RESPIRATORY_TRACT | Status: DC
  Filled 2022-05-23 (×2): qty 10

## 2022-05-23 NOTE — Progress Notes (Signed)
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 but missed a few doses over xmas and presented 12/28 with recurrent PE in setting of  DVT >L> R on Dopplers with RV/LV 1.2 no change from CT 06-14-20 and echo ok but coded am June 10, 2023 and transferred to ICU where  given Altepase empirically and noted to have R groin hematoma at sight of R CVL during code with one arterial stick and hgb dropping therefafter with neg GI source to date but all anticoagulation held and PCCM service asked to see pending transfer to [facility] when bed available with worsening uop/increasing K and creat developing while still on pressors and full vent support.   Pertinent  Medical History  obesity, T2DM, HTN, gout, COPD, hyperlipidemia. He also has a history of previous DVT/PE on xarelto.   Significant Hospital Events: Including procedures, antibiotic start and stop dates in addition to other pertinent events   06-10-2023: Cardiac arrest while in BR. CPR  initiated. TNK administered during CPR, fem cvl  placed/ post arrest shock, high dose pressors. Failed attempt on right femoral and right IJ access. Time to ROSC estimated over 30 minutes. Hgb dropped 12 to 8.8  12/31 on 3 pressors and stress dose steroids. In DKA. Insulin gtt started. LMWH placed on hold.  MRSA PCR screen 06-10-23 > neg  BC x 2 1/1  > one /two pos gpc + staph hominis. Unasyn started.  1/3 transferred to [facility] central venous catheter removed.  Blood cultures repeated.  Repeat echo: EF 50 to 55% no regional wall abnormality grade 1 diastolic dysfunction RV normal Renal function improving with Lasix.  Weaning pressors.  Adding Seroquel and clonazepam to assist with weaning 1/7 ETT exchange, bronch for ongoing hemoptysis   Interim History / Subjective:   Ongoing bloody ett secretions Sedated on vent to prevent further coughing fits and derecruitment  Objective   Blood pressure (!) 118/105, pulse (!) 141, temperature 99.1 F (37.3 C), temperature source Oral, resp. rate 15, height 6\' 4"  (1.93 m), weight (!) 172.1 kg, SpO2 97 %. CVP:  [6 mmHg-10 mmHg] 7 mmHg  Vent Mode: PRVC FiO2 (%):  [50 %-100 %] 50 % Set Rate:  [15 bmp] 15 bmp Vt Set:  [517 mL] 690 mL PEEP:  [8 cmH20] 8 cmH20 Plateau Pressure:  [21 cmH20-23 cmH20] 23 cmH20   Intake/Output Summary (Last 24 hours) at 05/23/2022 0943 Last data filed at 05/23/2022 0900 Gross per 24 hour  Intake 2494.59 ml  Output 6760 ml  Net -4265.41 ml    Filed Weights   2022-06-14 0500 05/21/22 0500 05/22/22 0500  Weight: (!) 185.4 kg (!) 184.8 kg (!) 172.1 kg    Examination: No distress sedated on vent Global anasarca Chronic venous stasis changes Abd soft, +BS RASS -5 Looks like aflutter on monitor rate 140s   Renal function continues to be severely compromised  Assessment & Plan:  Ongoing septic shock Serratia/enterobacter HCAP- with hemoptysis/hemorrhagic bronchitis Staph Hominis bacteremia- Curb sided with infectious disease, no need for TEE Ileus, hx gastroparesis- improved Acute kidney injury- polyuric on lasix; post arrest ATN S/P PEA arrest- from massive PE, IVC filter in place Volume overload- improving Hx COPD/OSA Class 3 obesity Resp failure- volume overload, mental status and hemoptysis precluding extubation at  present DM2 with hyperglycemia ABLA from R groin hematoma- seems to have settled out, exam benign today (05/23/22) Afib/aflutter  - Abx to meropenem - TXA nebs, DDAVP (uremic) - AC on hold pending stable H/H and improvement in hemoptysis - Start trickles and advance to goal - Dose of relistor - Levophed to MAP 65, start midodrine - Continue lasix for now, will try to figure out dry weight - Sedation  wean once hemoptysis eases up - Calcium supplementation -  Rebolus amio, continue drip - Guarded prognosis, son updated at bedside  Best Practice (right click and "Reselect all SmartList Selections" daily)   Diet/type: advance to goal DVT prophylaxis: SCD GI prophylaxis: PPI Lines: Central line Foley:  Yes, and it is still needed Code Status:  DNR Last date of multidisciplinary goals of care discussion: 1/5 patient's son was updated at bedside  85 min cc time reviewing complex case  Myrla Halsted MD PCCM

## 2022-05-23 NOTE — Progress Notes (Signed)
Nutrition Follow-up  DOCUMENTATION CODES:   Morbid obesity  INTERVENTION:  Tube Feeding via Cortrak (post-pyloric): -Advance Vital 1.5 by 10 mL/hour every 8 hours to goal rate of 60 mL/hour (1440 mL per day) -Once at goal rate, change to PROSource TF20 60 mL TID -Goal regimen provides: 2400 kcal, 157 grams of protein, 1094 mL H2O daily  Until at goal rate of tube feeds, continue PROSource TF20 60 mL every 4 hours as currently ordered to maximize protein intake, each packet provides 80 kcal and 20 grams of protein.  If unable to tolerate tube feed advancement, recommend initiation of TPN given inadequate nutrition x 8 days.  Monitor magnesium, potassium, and phosphorus BID for at least 3 days, MD to replete as needed, as pt is at risk for refeeding syndrome given inadequate nutrition x 8 days.  NUTRITION DIAGNOSIS:   Inadequate oral intake related to acute illness as evidenced by NPO status.  Ongoing.  GOAL:   Patient will meet greater than or equal to 90% of their needs  Progressing with advancement of tube feeds.  MONITOR:   Vent status, Labs, Weight trends, I & O's  REASON FOR ASSESSMENT:   Consult, Ventilator Enteral/tube feeding initiation and management  ASSESSMENT:   Pt admitted with chest pain and difficulty breathing, found to have multiple pulmonary emboli and LLE DVT. PMH significant for PE, DVT, DM, HTN, gout, COPD, OSA.  12/28: admitted 12/30: cardiac arrest, intubated 1/3: transferred [facility] to [place] 1/5: large volume of emesis overnight and tube feeds held; Cortrak tube placed with tip in proximal duodenum and trickle tube feeds started   Assessed patient at bedside. No family present at time of RD assessment. Discussed with RN. Pt has been tolerating trickle tube feeds. OGT output decreasing and appears to be bile (no signs of TF output in cannister). Pt had a bowel movement overnight (first BM this admission). Noted pt with normal renal  function at baseline and suffered AKI in setting of cardiac arrest. Has not required renal replacement therapy so far.  Pt was 169.9 kg on 05/12/22. Wt got up to 184.8 kg on 1/6. Most recent wt 172.1 kg on 1/7. Suspect fluctuations may be related to fluid status. Will continue to monitor.  Patient is currently intubated on ventilator support MV: 11.2 L/min Temp (24hrs), Avg:99.8 F (37.7 C), Min:99.1 F (37.3 C), Max:100.8 F (38.2 C)  Medications reviewed and include: Colace 100 mg BID, Novolog 3-9 units Q4hrs, lactulose, levothyroxine, Reglan 5 mg Q6hrs IV, Rena-vite per tube, pantoprazole, Miralax, sennosides 15 mL BID, amiodarone, fentanyl gtt, Lasix 160 mg Q12hrs IV, meropenem, Versed gtt, norepinephrine gtt at 23 mcg/min  Labs reviewed: CBG 180-228, BUN 132 (trending down), Creatinine 9.31 (trending down)  Enteral Access: 10 Fr. Cortrak tube placed 1/5; 96 cm in left nare secured with bridle; terminates in proximal duodenum per abdominal x-ray 1/5 Pt also with OGT placed 05/15/22; terminates in gastric body per abdominal x-ray 1/5; remains on low intermittent suction  TF regimen: Vital 1.5 at 10 mL/hour + PROSource TF20 60 mL every 4 hours  UOP: 5440 mL (1.3 mL/kg/hr) previous 24 hours  OGT output: 1700 mL output previous 24 hours (output now decreasing per RN)  I/O: +5691 mL since admission  Discussed with MD via secure chat. Plan is to start advancing tube feeds towards goal rate.  NUTRITION - FOCUSED PHYSICAL EXAM:  Flowsheet Row Most Recent Value  Orbital Region No depletion  Upper Arm Region No depletion  Thoracic  and Lumbar Region No depletion  Buccal Region Unable to assess  Temple Region No depletion  Clavicle Bone Region No depletion  Clavicle and Acromion Bone Region No depletion  Scapular Bone Region Unable to assess  Dorsal Hand No depletion  Patellar Region No depletion  Anterior Thigh Region No depletion  Posterior Calf Region Unable to assess  [edema]   Edema (RD Assessment) Moderate  Hair Reviewed  Eyes Unable to assess  Mouth Unable to assess  Skin Reviewed  Nails Reviewed      Diet Order:   Diet Order             Diet NPO time specified  Diet effective now                  EDUCATION NEEDS:   No education needs have been identified at this time  Skin:  Skin Assessment: Reviewed RN Assessment  Last BM:  05/23/22 (no stool characteristics available)  Height:   Ht Readings from Last 1 Encounters:  05/18/22 6\' 4"  (1.93 m)   Weight:   Wt Readings from Last 1 Encounters:  05/22/22 (!) 172.1 kg   Ideal Body Weight:  91.8 kg  BMI:  Body mass index is 46.18 kg/m.  Estimated Nutritional Needs:   Kcal:  2200-2400  Protein:  140-160g  Fluid:  1L + UOP  Lyrik Buresh 07/21/22, MS, RD, LDN, CNSC Pager number available on Amion

## 2022-05-23 NOTE — TOC Initial Note (Signed)
Transition of Care (TOC) - Initial/Assessment Note    Patient Details  Name: Frank Shelton. MRN: [id number] Date of Birth: 10/09/1960  Transition of Care [facility]) CM/SW Contact:    Bethena Roys, RN Phone Number: 05/23/2022, 4:27 PM  Clinical Narrative:  Patient was a transfer [facility]. Patient lives in [place] with minor son, who is with the sons mother at this time. Patient has support of  his children and parents were at the bedside during the visit. Patient remains intubated at this time-continues on Levophed and Fentanyl gtt. Case Manager will continue to follow for transition of care needs as the patient progresses.               Expected Discharge Plan:  (TBD)     Patient Goals and CMS Choice Patient states their goals for this hospitalization and ongoing recovery are:: patient remains intubated    Expected Discharge Plan and Services In-house Referral: NA Discharge Planning Services: CM Consult   Living arrangements for the past 2 months: Single Family Home  Prior Living Arrangements/Services Living arrangements for the past 2 months: Single Family Home Lives with:: Minor Children Patient language and need for interpreter reviewed:: Yes        Need for Family Participation in Patient Care: Yes (Comment) Care giver support system in place?: Yes (comment)   Criminal Activity/Legal Involvement Pertinent to Current Situation/Hospitalization: No - Comment as needed  Activities of Daily Living Home Assistive Devices/Equipment: Cane (specify quad or straight), CPAP ADL Screening (condition at time of admission) Patient's cognitive ability adequate to safely complete daily activities?: Yes Is the patient deaf or have difficulty hearing?: No Does the patient have difficulty seeing, even when wearing glasses/contacts?: No Does the patient have difficulty concentrating, remembering, or making decisions?: No Patient able to express need  for assistance with ADLs?: No Does the patient have difficulty dressing or bathing?: No Independently performs ADLs?: Yes (appropriate for developmental age) Does the patient have difficulty walking or climbing stairs?: Yes Weakness of Legs: None Weakness of Arms/Hands: Right  Emotional Assessment   Attitude/Demeanor/Rapport: Unable to Assess Affect (typically observed): Unable to Assess   Alcohol / Substance Use: Not Applicable Psych Involvement: No (comment)  Admission diagnosis:  Pulmonary embolism ([place]) [I26.99] Multiple subsegmental pulmonary emboli without acute cor pulmonale (HCC) [I26.94] Patient Active Problem List   Diagnosis Date Noted   Acute respiratory failure with hypoxia and hypercapnia ([place]) 05/22/2022   Shock ([place]) 05/22/2022   Coffee ground emesis 05/16/2022   Upper GI bleed 05/15/2022   Pulmonary embolism ([place]) 05/12/2022   COVID-19 virus infection 05/17/2020   Hyperglycemia 05/16/2020   Varicose veins of bilateral lower extremities with other complications 64/33/2951   Postsurgical hypothyroidism 05/04/2017   Multiple thyroid nodules 01/26/2017   DM type 2 causing vascular disease ([place]) 01/10/2017   Primary osteoarthritis of both first carpometacarpal joints 02/27/2014   Atypical chest pain 01/09/2014   Hypercoagulopathy ([place]) 01/09/2014   DVT (deep venous thrombosis) ([place]) 01/12/2012   Cellulitis of left lower extremity 01/12/2012   ASTHMA 08/02/2010   NUMBNESS, HAND 08/02/2010   GOUT 07/30/2010   Morbid obesity ([place]) 07/30/2010   OBSTRUCTIVE SLEEP APNEA 07/30/2010   RESTLESS LEG SYNDROME 07/30/2010   Essential hypertension 07/30/2010   ALLERGIC RHINITIS 07/30/2010   Other specified chronic obstructive pulmonary disease 07/30/2010   GERD 07/30/2010   PULMONARY EMBOLISM, HX OF 07/30/2010   GASTROINTESTINAL XRAY, ABNORMAL 09/14/2009   OTHER DYSPHAGIA 08/24/2009  PCP:  Vanessa Barry, FNP Pharmacy:   [facility] [place], [place] - [address] [place] [address] Phone: [phone] Fax: [phone]  [facility] [address], [place] - [place] [address] [place] [address] Phone: [phone] Fax: [phone]  [id number] [place], [place] - [address] AT [facility] [address] [place] [address] Phone: [phone] Fax: [phone]     Social Determinants of Health (SDOH) Social History: SDOH Screenings   Food Insecurity: No Food Insecurity (05/12/2022)  Housing: Low Risk  (05/12/2022)  Transportation Needs: No Transportation Needs (05/12/2022)  Utilities: Not At Risk (05/12/2022)  Tobacco Use: Medium Risk (05/12/2022)   SDOH Interventions:     Readmission Risk Interventions     No data to display

## 2022-05-23 NOTE — Progress Notes (Signed)
Patient ID: [name], male   DOB: 1960/10/10, 62 y.o.   MRN: [id number] [facility] Progress Note   Assessment/ Plan:   1. Acute kidney Injury: Normal renal function at baseline and suffered dense ATN/severe AKI in the setting of cardiac arrest. Currently nonoliguric while on furosemide and with slight improvement of BUN/creatinine that without acute/compelling indications for renal replacement therapy.  Will continue to monitor closely for recovery versus indications to begin renal replacement therapy. 2.  Status postcardiac arrest with persistent shock: Combined septic/cardiogenic: With recurrent PE possibly secondary to missed doses of Xarelto; treated with alteplase empirically.  Continues to require pressor support. 3.  Acute exacerbation of congestive heart failure: With evidence of some pulmonary edema on initial lung imaging and continues to respond well with ongoing diuresis.  Remains net negative from a fluid standpoint. 4.  Aspiration pneumonia/healthcare associated pneumonia: On Unasyn.  Remains ventilator dependent with shock. 5.  Anemia: Compounded earlier by groin hematoma status post alteplase and likely also secondary to critical illness.  Continue to monitor for overt loss and indications for PRBC as needed.  Subjective:   Without acute clinical events overnight, remains pressor dependent and tachycardic while on amiodarone drip.   Objective:   BP (!) 118/105   Pulse (!) 141   Temp 99.1 F (37.3 C) (Oral)   Resp 15   Ht 6\' 4"  (1.93 m)   Wt (!) 172.1 kg   SpO2 97%   BMI 46.18 kg/m   Intake/Output Summary (Last 24 hours) at 05/23/2022 0742 Last data filed at 05/23/2022 0700 Gross per 24 hour  Intake 2705.76 ml  Output 7140 ml  Net -4434.24 ml   Weight change:   Physical Exam: Gen: Intubated, sedated, appears comfortable CVS: Pulse regular tachycardia, S1 and S2 normal Resp: Anteriorly coarse breath sounds bilaterally without distinct rales or  rhonchi Abd: Soft, obese, nontender, bowel sounds normal Ext: Chronic bilateral lower extremity hyperpigmentation with 2+ pitting edema.  Legs in supportive boots.  Imaging: DG CHEST PORT 1 VIEW  Result Date: 05/22/2022 CLINICAL DATA:  Endotracheal tube placement. EXAM: PORTABLE CHEST 1 VIEW COMPARISON:  Chest radiograph performed 05/21/2022. FINDINGS: an endotracheal tube terminates 2.7 cm from the carina. An enteric tube terminates in the stomach. The heart is borderline enlarged. There is a small right pleural effusion. There is mild bibasilar atelectasis. There is no left pleural effusion. There is no pneumothorax on either side. IMPRESSION: 1. Endotracheal tube terminates 2.7 cm from the carina. 2. Small right pleural effusion. 3. Mild bibasilar atelectasis. Electronically Signed   By: 07/20/2022 M.D.   On: 05/22/2022 17:36   DG CHEST PORT 1 VIEW  Result Date: 05/22/2022 CLINICAL DATA:  Central line placement. EXAM: PORTABLE CHEST 1 VIEW COMPARISON:  Chest radiograph performed the same day. FINDINGS: A left subclavian approach central venous catheter tip overlies the left brachiocephalic vein/superior vena cava junction. An endotracheal tube terminates 2.8 cm from the carina. Two enteric tubes terminate in the stomach. The heart is borderline enlarged. There is a small right pleural effusion. There is mild bibasilar atelectasis. There is no left pleural effusion. There is no pneumothorax on either side. IMPRESSION: 1. Left subclavian central venous catheter tip overlies the left brachiocephalic vein/superior vena cava junction. Electronically Signed   By: 07/21/2022 M.D.   On: 05/22/2022 17:34    Labs: BMET Recent Labs  Lab 05/18/22 1528 05/18/22 1550 05/19/22 0145 05/19/22 0606 05/19/22 1023 05/20/22 07/19/22 05/20/22 07/19/22 05/20/22 1957 05/21/22  2993 05/21/22 0634 05/22/22 0144 05/23/22 0345  NA 137   < > 137 138  --  139  --  141 141 142 142 143  K 5.0   < > 4.3 4.5   < > 4.2 4.2  4.1 4.0 4.3 4.2 3.8  CL 99  --  99  --   --  102  --  102  --  100 101 101  CO2 20*  --  19*  --   --  20*  --  21*  --  22 22 24   GLUCOSE 292*  --  128*  --   --  170*  --  145*  --  145* 164* 229*  BUN 110*  --  115*  --   --  130*  --  134*  --  137* 136* 132*  CREATININE 8.94*  --  8.91*  --   --  9.21*  --  9.62*  --  9.50* 9.46* 9.31*  CALCIUM 6.3*  --  6.3*  --   --  6.6*  --  6.5*  --  6.4* 6.5* 6.7*  PHOS  --   --   --   --   --   --   --   --   --   --  7.9*  --    < > = values in this interval not displayed.   CBC Recent Labs  Lab 05/20/22 0627 05/21/22 0311 05/21/22 0634 05/22/22 0144 05/23/22 0345  WBC 12.2*  --  15.2* 16.3* 19.2*  NEUTROABS 10.5*  --   --   --   --   HGB 7.9* 7.8* 8.0* 7.8* 8.3*  HCT 24.6* 23.0* 24.6* 24.7* 26.0*  MCV 94.6  --  95.0 96.1 95.2  PLT 129*  --  190 191 236    Medications:     sodium chloride   Intravenous Once   budesonide (PULMICORT) nebulizer solution  0.5 mg Nebulization BID   Chlorhexidine Gluconate Cloth  6 each Topical Daily   clonazePAM  2 mg Per Tube TID   dextrose  1 ampule Intravenous Once   docusate  100 mg Per Tube BID   feeding supplement (PROSource TF20)  60 mL Per Tube Q4H   fentaNYL (SUBLIMAZE) injection  50 mcg Intravenous Once   insulin aspart  3-9 Units Subcutaneous Q4H   ipratropium  0.5 mg Nebulization Q6H   lactulose  20 g Per Tube TID   levalbuterol  0.63 mg Nebulization Q6H   levothyroxine  200 mcg Per Tube QAC breakfast   metoCLOPramide (REGLAN) injection  5 mg Intravenous Q6H   multivitamin  1 tablet Per Tube Daily   mouth rinse  15 mL Mouth Rinse Q2H   pantoprazole (PROTONIX) IV  40 mg Intravenous Q12H   polyethylene glycol  17 g Per Tube BID   QUEtiapine  100 mg Per Tube BID   sennosides  15 mL Per Tube BID    Elmarie Shiley, MD 05/23/2022, 7:42 AM

## 2022-05-24 DIAGNOSIS — I2694 Multiple subsegmental pulmonary emboli without acute cor pulmonale: Secondary | ICD-10-CM | POA: Diagnosis not present

## 2022-05-24 LAB — CBC
HCT: 25.8 % — ABNORMAL LOW (ref 39.0–52.0)
Hemoglobin: 8.2 g/dL — ABNORMAL LOW (ref 13.0–17.0)
MCH: 30.3 pg (ref 26.0–34.0)
MCHC: 31.8 g/dL (ref 30.0–36.0)
MCV: 95.2 fL (ref 80.0–100.0)
Platelets: 244 10*3/uL (ref 150–400)
RBC: 2.71 MIL/uL — ABNORMAL LOW (ref 4.22–5.81)
RDW: 19.8 % — ABNORMAL HIGH (ref 11.5–15.5)
WBC: 19 10*3/uL — ABNORMAL HIGH (ref 4.0–10.5)
nRBC: 0.8 % — ABNORMAL HIGH (ref 0.0–0.2)

## 2022-05-24 LAB — BASIC METABOLIC PANEL
Anion gap: 19 — ABNORMAL HIGH (ref 5–15)
BUN: 131 mg/dL — ABNORMAL HIGH (ref 8–23)
CO2: 24 mmol/L (ref 22–32)
Calcium: 6.7 mg/dL — ABNORMAL LOW (ref 8.9–10.3)
Chloride: 99 mmol/L (ref 98–111)
Creatinine, Ser: 8.56 mg/dL — ABNORMAL HIGH (ref 0.61–1.24)
GFR, Estimated: 6 mL/min — ABNORMAL LOW (ref >60)
Glucose, Bld: 284 mg/dL — ABNORMAL HIGH (ref 70–99)
Potassium: 3.8 mmol/L (ref 3.5–5.1)
Sodium: 142 mmol/L (ref 135–145)

## 2022-05-24 LAB — CARBAPENEM RESISTANCE PANEL
Carba Resistance IMP Gene: NOT DETECTED
Carba Resistance KPC Gene: NOT DETECTED
Carba Resistance NDM Gene: NOT DETECTED
Carba Resistance OXA48 Gene: NOT DETECTED
Carba Resistance VIM Gene: NOT DETECTED

## 2022-05-24 LAB — CULTURE, RESPIRATORY W GRAM STAIN: Culture: 50000 — AB

## 2022-05-24 LAB — GLUCOSE, CAPILLARY
Glucose-Capillary: 273 mg/dL — ABNORMAL HIGH (ref 70–99)
Glucose-Capillary: 278 mg/dL — ABNORMAL HIGH (ref 70–99)
Glucose-Capillary: 327 mg/dL — ABNORMAL HIGH (ref 70–99)
Glucose-Capillary: 346 mg/dL — ABNORMAL HIGH (ref 70–99)
Glucose-Capillary: 372 mg/dL — ABNORMAL HIGH (ref 70–99)
Glucose-Capillary: 399 mg/dL — ABNORMAL HIGH (ref 70–99)

## 2022-05-24 LAB — PHOSPHORUS
Phosphorus: 6.1 mg/dL — ABNORMAL HIGH (ref 2.5–4.6)
Phosphorus: 5.7 mg/dL — ABNORMAL HIGH (ref 2.5–4.6)

## 2022-05-24 LAB — PATHOLOGIST SMEAR REVIEW

## 2022-05-24 LAB — MAGNESIUM
Magnesium: 2.1 mg/dL (ref 1.7–2.4)
Magnesium: 2.2 mg/dL (ref 1.7–2.4)

## 2022-05-24 MED ORDER — IPRATROPIUM BROMIDE 0.02 % IN SOLN
0.5000 mg | Freq: Four times a day (QID) | RESPIRATORY_TRACT | Status: DC
  Administered 2022-05-24: 0.5 mg via RESPIRATORY_TRACT
  Filled 2022-05-24: qty 2.5

## 2022-05-24 MED ORDER — IPRATROPIUM BROMIDE 0.02 % IN SOLN
0.5000 mg | Freq: Four times a day (QID) | RESPIRATORY_TRACT | Status: DC
  Administered 2022-05-24 – 2022-05-30 (×24): 0.5 mg via RESPIRATORY_TRACT
  Filled 2022-05-24 (×24): qty 2.5

## 2022-05-24 MED ORDER — PROSOURCE TF20 ENFIT COMPATIBL EN LIQD
60.0000 mL | Freq: Three times a day (TID) | ENTERAL | Status: DC
  Administered 2022-05-24 – 2022-06-07 (×41): 60 mL
  Filled 2022-05-24 (×41): qty 60

## 2022-05-24 MED ORDER — CALCIUM GLUCONATE-NACL 1-0.675 GM/50ML-% IV SOLN
1.0000 g | Freq: Once | INTRAVENOUS | Status: AC
  Administered 2022-05-24: 1000 mg via INTRAVENOUS
  Filled 2022-05-24: qty 50

## 2022-05-24 MED ORDER — LEVALBUTEROL HCL 0.63 MG/3ML IN NEBU
0.6300 mg | INHALATION_SOLUTION | Freq: Four times a day (QID) | RESPIRATORY_TRACT | Status: DC
  Administered 2022-05-24 – 2022-05-30 (×24): 0.63 mg via RESPIRATORY_TRACT
  Filled 2022-05-24 (×25): qty 3

## 2022-05-24 MED ORDER — INSULIN ASPART 100 UNIT/ML IJ SOLN
20.0000 [IU] | Freq: Once | INTRAMUSCULAR | Status: AC
  Administered 2022-05-24: 20 [IU] via SUBCUTANEOUS

## 2022-05-24 MED ORDER — INSULIN ASPART 100 UNIT/ML IJ SOLN
2.0000 [IU] | INTRAMUSCULAR | Status: DC
  Administered 2022-05-24: 2 [IU] via SUBCUTANEOUS

## 2022-05-24 MED ORDER — AMIODARONE LOAD VIA INFUSION
150.0000 mg | Freq: Once | INTRAVENOUS | Status: AC
  Administered 2022-05-24: 150 mg via INTRAVENOUS
  Filled 2022-05-24: qty 83.34

## 2022-05-24 MED ORDER — LEVALBUTEROL HCL 0.63 MG/3ML IN NEBU
0.6300 mg | INHALATION_SOLUTION | Freq: Four times a day (QID) | RESPIRATORY_TRACT | Status: DC
  Administered 2022-05-24: 0.63 mg via RESPIRATORY_TRACT
  Filled 2022-05-24: qty 3

## 2022-05-24 MED ORDER — POTASSIUM CHLORIDE 20 MEQ PO PACK
20.0000 meq | PACK | Freq: Once | ORAL | Status: AC
  Administered 2022-05-24: 20 meq
  Filled 2022-05-24: qty 1

## 2022-05-24 MED ORDER — INSULIN GLARGINE-YFGN 100 UNIT/ML ~~LOC~~ SOLN
10.0000 [IU] | Freq: Every day | SUBCUTANEOUS | Status: DC
  Administered 2022-05-24: 10 [IU] via SUBCUTANEOUS
  Filled 2022-05-24 (×2): qty 0.1

## 2022-05-24 MED ORDER — FUROSEMIDE 10 MG/ML IJ SOLN
80.0000 mg | Freq: Three times a day (TID) | INTRAMUSCULAR | Status: DC
  Administered 2022-05-24 – 2022-05-25 (×4): 80 mg via INTRAVENOUS
  Filled 2022-05-24 (×4): qty 8

## 2022-05-24 NOTE — Progress Notes (Signed)
NAME:  Frank Shelton., MRN:  [id number], DOB:  27-Apr-1961, LOS: 10 ADMISSION DATE:  05/12/2022, CONSULTATION DATE:  05/17/22 REFERRING MD:  [place], CHIEF COMPLAINT:  vent dep resp failure   History of Present Illness:  61 yowm with h/o PE/DVT on long term xarleto but missed a few doses over xmas and presented 12/28 with recurrent PE in setting of  DVT >L> R on Dopplers with RV/LV 1.2 no change from CT May 30, 2020 and echo ok but coded am May 25, 2023 and transferred to ICU where  given Altepase empirically and noted to have R groin hematoma at sight of R CVL during code with one arterial stick and hgb dropping therefafter with neg GI source to date but all anticoagulation held and PCCM service asked to see pending transfer to [facility] when bed available with worsening uop/increasing K and creat developing while still on pressors and full vent support.   Pertinent  Medical History  obesity, T2DM, HTN, gout, COPD, hyperlipidemia. He also has a history of previous DVT/PE on xarelto.   Significant Hospital Events: Including procedures, antibiotic start and stop dates in addition to other pertinent events   2023-05-25: Cardiac arrest while in BR. CPR  initiated. TNK administered during CPR, fem cvl  placed/ post arrest shock, high dose pressors. Failed attempt on right femoral and right IJ access. Time to ROSC estimated over 30 minutes. Hgb dropped 12 to 8.8  12/31 on 3 pressors and stress dose steroids. In DKA. Insulin gtt started. LMWH placed on hold.  MRSA PCR screen May 25, 2023 > neg  BC x 2 1/1  > one /two pos gpc + staph hominis. Unasyn started.  1/3 transferred to [facility] central venous catheter removed.  Blood cultures repeated.  Repeat echo: EF 50 to 55% no regional wall abnormality grade 1 diastolic dysfunction RV normal Renal function improving with Lasix.  Weaning pressors.  Adding Seroquel and clonazepam to assist with weaning 1/7 ETT exchange, bronch for ongoing hemoptysis   Interim History / Subjective:   Bloody secretions improved. Remains afib/RVR  Objective   Blood pressure (!) 118/105, pulse (!) 126, temperature 98.7 F (37.1 C), temperature source Oral, resp. rate 16, height 6\' 4"  (1.93 m), weight (!) 165.4 kg, SpO2 98 %. CVP:  [3 mmHg-8 mmHg] 5 mmHg  Vent Mode: PRVC FiO2 (%):  [40 %-45 %] 40 % Set Rate:  [15 bmp] 15 bmp Vt Set:  mL] 690 mL PEEP:  [8 cmH20] 8 cmH20 Plateau Pressure:  [18 cmH20-24 cmH20] 22 cmH20   Intake/Output Summary (Last 24 hours) at 05/24/2022 0912 Last data filed at 05/24/2022 0900 Gross per 24 hour  Intake 2837.33 ml  Output 7730 ml  Net -4892.67 ml    Filed Weights   05/21/22 0500 05/22/22 0500 05/24/22 0600  Weight: (!) 184.8 kg (!) 172.1 kg (!) 165.4 kg    Examination: No distress sedated on vent Global anasarca stable Chronic venous stasis changes Abd soft, +BS RASS -5 R groin bruising stable Heart rate irregular, ext warm  BUN/Cr slightly better Remains on pressors  Assessment & Plan:  Ongoing septic shock Serratia/enterobacter HCAP- with hemoptysis/hemorrhagic bronchitis Staph Hominis bacteremia- Curb sided with infectious disease, no need for TEE Ileus, hx gastroparesis- improved; last BM 1/8 Acute kidney injury- polyuric on lasix; post arrest ATN S/P PEA arrest- from massive PE, IVC filter in place Volume overload- improving Hx COPD/OSA Class 3 obesity Resp failure- volume overload, mental status and hemoptysis precluding extubation at present DM2 with hyperglycemia ABLA  from R groin hematoma- seems to have settled out, exam benign today (05/23/22) Afib/aflutter  - Abx to meropenem x 7 days - AC on hold pending stable H/H and improvement in hemoptysis: 1 more day - Advance TF as tolerated - Continue reglan, lactulose - Levophed to MAP 65, start midodrine - Continue lasix for now, nephro following - Hold sedation this am and see how/if wakes up: if not needs MRI and GOC discussion - Rebolus amio again, continue drip -  Guarded prognosis, son updated at bedside 05/23/22  Best Practice (right click and "Reselect all SmartList Selections" daily)   Diet/type: advance to goal DVT prophylaxis: SCD GI prophylaxis: PPI Lines: Central line Foley:  Yes, and it is still needed Code Status:  DNR Last date of multidisciplinary goals of care discussion: 1/8 patient's son was updated at bedside  35 min cc time reviewing complex case  Erskine Emery MD PCCM

## 2022-05-24 NOTE — Progress Notes (Signed)
Patient ID: Frank Pesa., male   DOB: 1960-12-06, 62 y.o.   MRN: [id number] [facility] Progress Note   Assessment/ Plan:   1. Acute kidney Injury: Normal renal function at baseline and appears to have suffered dense ATN/severe AKI in the setting of cardiac arrest.  He remains polyuric/nonoliguric with ongoing diuresis; I will decrease furosemide dose from 160 mg twice daily to 80 mg 3 times daily to help limit volume depletion and its effect on his tachycardia/arrhythmia as well as electrolyte wasting.  He does not have any indication for dialysis at this time and creatinine continues to change down. 2.  Status postcardiac arrest with persistent shock: Combined septic/cardiogenic: With recurrent PE possibly secondary to missed doses of Xarelto; treated with alteplase empirically.  Remains on pressor support. 3.  Acute exacerbation of congestive heart failure: With evidence of some pulmonary edema on initial lung imaging and continues to respond well with ongoing diuresis.  Net negative from a fluid standpoint overnight but still with evidence of volume excess on exam. 4.  Aspiration pneumonia/healthcare associated pneumonia: On Unasyn.  Remains ventilator dependent with shock. 5.  Anemia: Compounded earlier by groin hematoma status post alteplase and likely also secondary to critical illness.  Continue to monitor for overt loss and indications for PRBC as needed.  Subjective:   Atrial fibrillation with rapid ventricular response noted overnight along with hemodynamic instability.   Objective:   BP (!) 118/105   Pulse (!) 214   Temp 99.1 F (37.3 C) (Oral)   Resp 15   Ht 6\' 4"  (1.93 m)   Wt (!) 165.4 kg   SpO2 98%   BMI 44.39 kg/m   Intake/Output Summary (Last 24 hours) at 05/24/2022 0735 Last data filed at 05/24/2022 0700 Gross per 24 hour  Intake 2957.33 ml  Output 7605 ml  Net -4647.67 ml   Weight change:   Physical Exam: Gen: Intubated, sedated, resting  comfortably CVS: Pulse regular tachycardia, S1 and S2 normal Resp: Anteriorly coarse breath sounds bilaterally without distinct rales or rhonchi Abd: Soft, obese, nontender, bowel sounds normal Ext: Chronic bilateral lower extremity hyperpigmentation with 2+ pitting edema.  Legs in supportive boots.  Imaging: DG CHEST PORT 1 VIEW  Result Date: 05/22/2022 CLINICAL DATA:  Endotracheal tube placement. EXAM: PORTABLE CHEST 1 VIEW COMPARISON:  Chest radiograph performed 05/21/2022. FINDINGS: an endotracheal tube terminates 2.7 cm from the carina. An enteric tube terminates in the stomach. The heart is borderline enlarged. There is a small right pleural effusion. There is mild bibasilar atelectasis. There is no left pleural effusion. There is no pneumothorax on either side. IMPRESSION: 1. Endotracheal tube terminates 2.7 cm from the carina. 2. Small right pleural effusion. 3. Mild bibasilar atelectasis. Electronically Signed   By: Zerita Boers M.D.   On: 05/22/2022 17:36   DG CHEST PORT 1 VIEW  Result Date: 05/22/2022 CLINICAL DATA:  Central line placement. EXAM: PORTABLE CHEST 1 VIEW COMPARISON:  Chest radiograph performed the same day. FINDINGS: A left subclavian approach central venous catheter tip overlies the left brachiocephalic vein/superior vena cava junction. An endotracheal tube terminates 2.8 cm from the carina. Two enteric tubes terminate in the stomach. The heart is borderline enlarged. There is a small right pleural effusion. There is mild bibasilar atelectasis. There is no left pleural effusion. There is no pneumothorax on either side. IMPRESSION: 1. Left subclavian central venous catheter tip overlies the left brachiocephalic vein/superior vena cava junction. Electronically Signed   By: Zerita Boers  M.D.   On: 05/22/2022 17:34    Labs: BMET Recent Labs  Lab 05/19/22 0145 05/19/22 8315 05/20/22 1761 05/20/22 6073 05/20/22 1957 05/21/22 7106 05/21/22 2694 05/22/22 0144  05/23/22 0345 05/23/22 1534 05/24/22 0539  NA 137   < > 139  --  141 141 142 142 143  --  142  K 4.3   < > 4.2 4.2 4.1 4.0 4.3 4.2 3.8  --  3.8  CL 99  --  102  --  102  --  100 101 101  --  99  CO2 19*  --  20*  --  21*  --  22 22 24   --  24  GLUCOSE 128*  --  170*  --  145*  --  145* 164* 229*  --  284*  BUN 115*  --  130*  --  134*  --  137* 136* 132*  --  131*  CREATININE 8.91*  --  9.21*  --  9.62*  --  9.50* 9.46* 9.31*  --  8.56*  CALCIUM 6.3*  --  6.6*  --  6.5*  --  6.4* 6.5* 6.7*  --  6.7*  PHOS  --   --   --   --   --   --   --  7.9*  --  6.2* 6.1*   < > = values in this interval not displayed.   CBC Recent Labs  Lab 05/20/22 0627 05/21/22 0311 05/21/22 0634 05/22/22 0144 05/23/22 0345 05/24/22 0539  WBC 12.2*  --  15.2* 16.3* 19.2* 19.0*  NEUTROABS 10.5*  --   --   --   --   --   HGB 7.9*   < > 8.0* 7.8* 8.3* 8.2*  HCT 24.6*   < > 24.6* 24.7* 26.0* 25.8*  MCV 94.6  --  95.0 96.1 95.2 95.2  PLT 129*  --  190 191 236 244   < > = values in this interval not displayed.    Medications:     sodium chloride   Intravenous Once   budesonide (PULMICORT) nebulizer solution  0.5 mg Nebulization BID   Chlorhexidine Gluconate Cloth  6 each Topical Daily   clonazePAM  2 mg Per Tube TID   dextrose  1 ampule Intravenous Once   docusate  100 mg Per Tube BID   feeding supplement (PROSource TF20)  60 mL Per Tube Q4H   furosemide  80 mg Intravenous Q8H   insulin aspart  3-9 Units Subcutaneous Q4H   ipratropium  0.5 mg Nebulization QID   lactulose  20 g Per Tube TID   levalbuterol  0.63 mg Nebulization QID   levothyroxine  200 mcg Per Tube QAC breakfast   metoCLOPramide (REGLAN) injection  5 mg Intravenous Q6H   midodrine  10 mg Per Tube Q8H   multivitamin  1 tablet Per Tube Daily   mouth rinse  15 mL Mouth Rinse Q2H   pantoprazole (PROTONIX) IV  40 mg Intravenous Q12H   polyethylene glycol  17 g Per Tube BID   QUEtiapine  100 mg Per Tube BID   sennosides  15 mL Per Tube  BID   tranexamic acid  500 mg Nebulization TID    07/23/22, MD 05/24/2022, 7:35 AM

## 2022-05-24 NOTE — Inpatient Diabetes Management (Signed)
Inpatient Diabetes Program Recommendations  AACE/ADA: New Consensus Statement on Inpatient Glycemic Control (2015)  Target Ranges:  Prepandial:   less than 140 mg/dL      Peak postprandial:   less than 180 mg/dL (1-2 hours)      Critically ill patients:  140 - 180 mg/dL   Lab Results  Component Value Date   GLUCAP 278 (H) 05/24/2022   HGBA1C 11.4 (H) 05/12/2022    Latest Reference Range & Units 05/23/22 07:36 05/23/22 11:11 05/23/22 15:41 05/23/22 19:50 05/23/22 23:08 05/24/22 05:38 05/24/22 08:05  Glucose-Capillary 70 - 99 mg/dL 180 (H) 212 (H) 215 (H) 223 (H) 220 (H) 273 (H) 278 (H)  (H): Data is abnormally high  Inpatient Diabetes Program Recommendations:   Please consider: -Novolog 2 units q 4 hrs. Tube feed coverage (hold is tube feed held or stopped).  Thank you, Nani Gasser. Arly Salminen, RN, MSN, CDE  Diabetes Coordinator Inpatient Glycemic Control Team Team Pager [phone] (8am-5pm) 05/24/2022 9:56 AM

## 2022-05-24 NOTE — Progress Notes (Signed)
[facility] Progress Note Patient Name: Frank Shelton. DOB: 1960/07/16 MRN: [id number]   Date of Service  05/24/2022  HPI/Events of Note  Afib with cont rate 140's    113/60(75)    CVP 5      On Vent with Amio 30, Levo 25 Receiving diuretics and is 13 liters negative the past days but is still 6 liters positive overall since admission  eICU Interventions  Ordered BMP now to assess if with signs of volume depletion and correct electrolytes as necessary     Intervention Category Intermediate Interventions: Arrhythmia - evaluation and management  Judd Lien 05/24/2022, 1:33 AM

## 2022-05-24 NOTE — Progress Notes (Signed)
[facility] Progress Note Patient Name: Frank Shelton. DOB: Nov 14, 1960 MRN: [id number]   Date of Service  05/24/2022  HPI/Events of Note  Hyperglycemia - Blood glucose = 399. Currently on SemGlee and Q 4 hour resistant Novolog SSI.  eICU Interventions  Plan: Novolog insulin 20 units [place] X 1.  If control not improved, may need an insulin IV infusion.      Intervention Category Major Interventions: Hyperglycemia - active titration of insulin therapy  Lysle Dingwall 05/24/2022, 8:41 PM

## 2022-05-24 NOTE — Progress Notes (Signed)
[facility] Progress Note Patient Name: Frank Shelton. DOB: 12-Jan-1961 MRN: [id number]   Date of Service  05/24/2022  HPI/Events of Note  Labs back, still afib with rate 142    on vent with amio 30 and Levo at 23   BP 95/56  (68)    CVP 8 Calcium 6.7  eICU Interventions  Ordered calcium gluconate 1 g IVPB     Intervention Category Intermediate Interventions: Arrhythmia - evaluation and management  Judd Lien 05/24/2022, 6:43 AM

## 2022-05-24 NOTE — Progress Notes (Addendum)
Nutrition Follow-up  DOCUMENTATION CODES:   Morbid obesity  INTERVENTION:   Tube Feeding via Cortrak(PP): continue to titrate TF to goal Vital 1.5 at 40 ml/hr, titrate by 10 mL q 8 hours until goal rate of 21ml/hr Pro-Source TF20 60 mL TID Goal regimen provides: 2400 kcal, 157 grams of protein, 1094 mL H2O daily    NUTRITION DIAGNOSIS:   Inadequate oral intake related to acute illness as evidenced by NPO status.  Being addressed via TF  GOAL:   Patient will meet greater than or equal to 90% of their needs  Progressing  MONITOR:   Vent status, Labs, Weight trends, I & O's  REASON FOR ASSESSMENT:   Consult, Ventilator Enteral/tube feeding initiation and management  ASSESSMENT:   Pt admitted with chest pain and difficulty breathing, found to have multiple pulmonary emboli and LLE DVT. PMH significant for PE, DVT, DM, HTN, gout, COPD, OSA.  12/28: Admitted 12/30: Cardiac arrest, Intubated 01/03: Transferred from [facility], TF initiated 01/05: large volume of emesis overnight and tube feeds held; Cortrak tube placed with tip in proximal duodenum and trickle tube feeds started 1/07: ETT exchange, bronch 1/08: began titration of TF to goal rate   Pt remains on vent support, levophed down to 10.   Tolerating TF at rate of 40 ml/hr via Cortrak today. OG remains in place  Noted CBGs not well controlled at present, recommend insulin drip for now  UOP >6 L yesterday. Noted lasix dosing modified today. Creatinine 8.56 (trending back down), BUN 131. Of note, azotemia at this time is not related to excess protein provision as pt has been receiving inadequate nutrition since admission and only began titration towards goal yesterday afternoon, currently TF not at goal rate  NG with 1.4 L out in 24 hours, 800 mL thus far today, bilious output  Labs: CBGs 220-327, BUN 131, Creatinine 8.56, phosphorous 6.1 (H), anion gap 16 Meds: reglan, lactulose, lasix   Diet  Order:   Diet Order             Diet NPO time specified  Diet effective now                   EDUCATION NEEDS:   No education needs have been identified at this time  Skin:  Skin Assessment: Reviewed RN Assessment  Last BM:  05/23/22 (no stool characteristics available)  Height:   Ht Readings from Last 1 Encounters:  05/18/22 6\' 4"  (1.93 m)    Weight:   Wt Readings from Last 1 Encounters:  05/24/22 (!) 165.4 kg    Ideal Body Weight:  91.8 kg  BMI:  Body mass index is 44.39 kg/m.  Estimated Nutritional Needs:   Kcal:  2200-2400  Protein:  140-160g  Fluid:  1L + UOP  Kerman Passey MS, RDN, LDN, CNSC Registered Dietitian 3 Clinical Nutrition RD Pager and RD On-Call Pager Number Located in [facility]

## 2022-05-25 DIAGNOSIS — J9600 Acute respiratory failure, unspecified whether with hypoxia or hypercapnia: Secondary | ICD-10-CM

## 2022-05-25 DIAGNOSIS — R579 Shock, unspecified: Secondary | ICD-10-CM | POA: Diagnosis not present

## 2022-05-25 DIAGNOSIS — R6521 Severe sepsis with septic shock: Secondary | ICD-10-CM

## 2022-05-25 DIAGNOSIS — A419 Sepsis, unspecified organism: Secondary | ICD-10-CM

## 2022-05-25 DIAGNOSIS — J1569 Pneumonia due to other gram-negative bacteria: Secondary | ICD-10-CM | POA: Insufficient documentation

## 2022-05-25 LAB — POCT I-STAT 7, (LYTES, BLD GAS, ICA,H+H)
Acid-Base Excess: 8 mmol/L — ABNORMAL HIGH (ref 0.0–2.0)
Bicarbonate: 31.5 mmol/L — ABNORMAL HIGH (ref 20.0–28.0)
Calcium, Ion: 0.97 mmol/L — ABNORMAL LOW (ref 1.15–1.40)
HCT: 27 % — ABNORMAL LOW (ref 39.0–52.0)
Hemoglobin: 9.2 g/dL — ABNORMAL LOW (ref 13.0–17.0)
O2 Saturation: 97 %
Patient temperature: 36.5
Potassium: 3.4 mmol/L — ABNORMAL LOW (ref 3.5–5.1)
Sodium: 149 mmol/L — ABNORMAL HIGH (ref 135–145)
TCO2: 33 mmol/L — ABNORMAL HIGH (ref 22–32)
pCO2 arterial: 38.8 mmHg (ref 32–48)
pH, Arterial: 7.516 — ABNORMAL HIGH (ref 7.35–7.45)
pO2, Arterial: 78 mmHg — ABNORMAL LOW (ref 83–108)

## 2022-05-25 LAB — MAGNESIUM
Magnesium: 2.3 mg/dL (ref 1.7–2.4)
Magnesium: 2.3 mg/dL (ref 1.7–2.4)

## 2022-05-25 LAB — BASIC METABOLIC PANEL
Anion gap: 18 — ABNORMAL HIGH (ref 5–15)
Anion gap: 17 — ABNORMAL HIGH (ref 5–15)
BUN: 134 mg/dL — ABNORMAL HIGH (ref 8–23)
BUN: 136 mg/dL — ABNORMAL HIGH (ref 8–23)
CO2: 29 mmol/L (ref 22–32)
CO2: 29 mmol/L (ref 22–32)
Calcium: 7.2 mg/dL — ABNORMAL LOW (ref 8.9–10.3)
Calcium: 7.4 mg/dL — ABNORMAL LOW (ref 8.9–10.3)
Chloride: 99 mmol/L (ref 98–111)
Chloride: 103 mmol/L (ref 98–111)
Creatinine, Ser: 7.68 mg/dL — ABNORMAL HIGH (ref 0.61–1.24)
Creatinine, Ser: 7.27 mg/dL — ABNORMAL HIGH (ref 0.61–1.24)
GFR, Estimated: 7 mL/min — ABNORMAL LOW (ref >60)
GFR, Estimated: 8 mL/min — ABNORMAL LOW (ref >60)
Glucose, Bld: 438 mg/dL — ABNORMAL HIGH (ref 70–99)
Glucose, Bld: 183 mg/dL — ABNORMAL HIGH (ref 70–99)
Potassium: 3.5 mmol/L (ref 3.5–5.1)
Potassium: 3 mmol/L — ABNORMAL LOW (ref 3.5–5.1)
Sodium: 146 mmol/L — ABNORMAL HIGH (ref 135–145)
Sodium: 149 mmol/L — ABNORMAL HIGH (ref 135–145)

## 2022-05-25 LAB — GLUCOSE, CAPILLARY
Glucose-Capillary: 399 mg/dL — ABNORMAL HIGH (ref 70–99)
Glucose-Capillary: 417 mg/dL — ABNORMAL HIGH (ref 70–99)
Glucose-Capillary: 461 mg/dL — ABNORMAL HIGH (ref 70–99)
Glucose-Capillary: 399 mg/dL — ABNORMAL HIGH (ref 70–99)
Glucose-Capillary: 405 mg/dL — ABNORMAL HIGH (ref 70–99)
Glucose-Capillary: 365 mg/dL — ABNORMAL HIGH (ref 70–99)
Glucose-Capillary: 322 mg/dL — ABNORMAL HIGH (ref 70–99)
Glucose-Capillary: 275 mg/dL — ABNORMAL HIGH (ref 70–99)
Glucose-Capillary: 290 mg/dL — ABNORMAL HIGH (ref 70–99)
Glucose-Capillary: 229 mg/dL — ABNORMAL HIGH (ref 70–99)
Glucose-Capillary: 239 mg/dL — ABNORMAL HIGH (ref 70–99)
Glucose-Capillary: 247 mg/dL — ABNORMAL HIGH (ref 70–99)
Glucose-Capillary: 209 mg/dL — ABNORMAL HIGH (ref 70–99)
Glucose-Capillary: 209 mg/dL — ABNORMAL HIGH (ref 70–99)
Glucose-Capillary: 212 mg/dL — ABNORMAL HIGH (ref 70–99)
Glucose-Capillary: 205 mg/dL — ABNORMAL HIGH (ref 70–99)
Glucose-Capillary: 192 mg/dL — ABNORMAL HIGH (ref 70–99)
Glucose-Capillary: 173 mg/dL — ABNORMAL HIGH (ref 70–99)
Glucose-Capillary: 151 mg/dL — ABNORMAL HIGH (ref 70–99)
Glucose-Capillary: 144 mg/dL — ABNORMAL HIGH (ref 70–99)
Glucose-Capillary: 148 mg/dL — ABNORMAL HIGH (ref 70–99)

## 2022-05-25 LAB — CBC
HCT: 26 % — ABNORMAL LOW (ref 39.0–52.0)
Hemoglobin: 8.2 g/dL — ABNORMAL LOW (ref 13.0–17.0)
MCH: 30.4 pg (ref 26.0–34.0)
MCHC: 31.5 g/dL (ref 30.0–36.0)
MCV: 96.3 fL (ref 80.0–100.0)
Platelets: 211 10*3/uL (ref 150–400)
RBC: 2.7 MIL/uL — ABNORMAL LOW (ref 4.22–5.81)
RDW: 19.7 % — ABNORMAL HIGH (ref 11.5–15.5)
WBC: 16.2 10*3/uL — ABNORMAL HIGH (ref 4.0–10.5)
nRBC: 0.5 % — ABNORMAL HIGH (ref 0.0–0.2)

## 2022-05-25 LAB — HEPARIN LEVEL (UNFRACTIONATED): Heparin Unfractionated: <0.1 IU/mL — ABNORMAL LOW (ref 0.30–0.70)

## 2022-05-25 LAB — PHOSPHORUS
Phosphorus: 4.8 mg/dL — ABNORMAL HIGH (ref 2.5–4.6)
Phosphorus: 3.5 mg/dL (ref 2.5–4.6)

## 2022-05-25 LAB — AMMONIA: Ammonia: 41 umol/L — ABNORMAL HIGH (ref 9–35)

## 2022-05-25 MED ORDER — POTASSIUM CHLORIDE 10 MEQ/50ML IV SOLN
10.0000 meq | Freq: Once | INTRAVENOUS | Status: AC
  Administered 2022-05-25: 10 meq via INTRAVENOUS
  Filled 2022-05-25: qty 50

## 2022-05-25 MED ORDER — HEPARIN (PORCINE) 25000 UT/250ML-% IV SOLN
2200.0000 [IU]/h | INTRAVENOUS | Status: DC
  Administered 2022-05-25: 1200 [IU]/h via INTRAVENOUS
  Administered 2022-05-26: 1650 [IU]/h via INTRAVENOUS
  Administered 2022-05-26: 1900 [IU]/h via INTRAVENOUS
  Administered 2022-05-27: 2200 [IU]/h via INTRAVENOUS
  Filled 2022-05-25 (×5): qty 250

## 2022-05-25 MED ORDER — AMIODARONE IV BOLUS ONLY 150 MG/100ML
150.0000 mg | Freq: Once | INTRAVENOUS | Status: AC
  Administered 2022-05-25: 150 mg via INTRAVENOUS

## 2022-05-25 MED ORDER — INSULIN REGULAR(HUMAN) IN NACL 100-0.9 UT/100ML-% IV SOLN
INTRAVENOUS | Status: DC
  Administered 2022-05-25: 25 [IU]/h via INTRAVENOUS
  Administered 2022-05-25: 11.5 [IU]/h via INTRAVENOUS
  Administered 2022-05-25: 26 [IU]/h via INTRAVENOUS
  Administered 2022-05-25: 18 [IU]/h via INTRAVENOUS
  Administered 2022-05-26: 15 [IU]/h via INTRAVENOUS
  Administered 2022-05-26: 16 [IU]/h via INTRAVENOUS
  Filled 2022-05-25 (×6): qty 100

## 2022-05-25 MED ORDER — POTASSIUM CHLORIDE 10 MEQ/50ML IV SOLN
10.0000 meq | INTRAVENOUS | Status: AC
  Administered 2022-05-25 – 2022-05-26 (×6): 10 meq via INTRAVENOUS
  Filled 2022-05-25 (×6): qty 50

## 2022-05-25 MED ORDER — FREE WATER
200.0000 mL | Status: DC
  Administered 2022-05-25 – 2022-05-26 (×6): 200 mL

## 2022-05-25 MED ORDER — POTASSIUM CHLORIDE 20 MEQ PO PACK
40.0000 meq | PACK | Freq: Once | ORAL | Status: AC
  Administered 2022-05-25: 40 meq
  Filled 2022-05-25: qty 2

## 2022-05-25 MED ORDER — FUROSEMIDE 10 MG/ML IJ SOLN
80.0000 mg | Freq: Two times a day (BID) | INTRAMUSCULAR | Status: DC
  Administered 2022-05-25 – 2022-05-26 (×2): 80 mg via INTRAVENOUS
  Filled 2022-05-25 (×3): qty 8

## 2022-05-25 MED ORDER — DEXTROSE 50 % IV SOLN: 0.0000 mL | INTRAVENOUS | Status: DC | PRN

## 2022-05-25 NOTE — Progress Notes (Addendum)
ANTICOAGULATION CONSULT NOTE   Pharmacy Consult for heparin Indication: DVT  Allergies  Allergen Reactions   Nabumetone Hives    Patient Measurements: Height: 6\' 4"  (193 cm) Weight: (!) 160.6 kg (354 lb 0.9 oz) IBW/kg (Calculated) : 86.8 HEPARIN DW (KG): 129.7   Vital Signs: Temp: 98.4 F (36.9 C) (01/10 1500) Temp Source: Axillary (01/10 1500) BP: 111/53 (01/10 1253) Pulse Rate: 95 (01/10 1800)  Labs: Recent Labs    05/23/22 0345 05/24/22 0539 05/25/22 0304 05/25/22 1246 05/25/22 1800  HGB 8.3* 8.2* 8.2* 9.2*  --   HCT 26.0* 25.8* 26.0* 27.0*  --   PLT 236 244 211  --   --   HEPARINUNFRC  --   --   --   --  <0.10*  CREATININE 9.31* 8.56* 7.68*  --   --      Estimated Creatinine Clearance: 16.2 mL/min (A) (by C-G formula based on SCr of 7.68 mg/dL (H)).   Medical History: Past Medical History:  Diagnosis Date   Arthritis    L shoulder & fingers    Bronchitis    during enviromental changes    Complication of anesthesia    Report of airway problem   Diabetes mellitus without complication (HCC)    Dyspnea    Embolism - blood clot    Gout    Headache    uses ibuprofen & tylenol & its relieved    Hypertension    Hypothyroidism    Myocardial infarction ([place]) 2012   OSA on CPAP    Peripheral vascular disease ([place])    Pulmonary embolism ([place])    Stroke ([place]) 2014   Assessment: 62 yo male with recurrent PE/DVT on xarelto PTA for history of PE/DVT. He is s/p TNK on 12/30 followed by hemorrhagic shock from right groin hematoma.  On 1/5, heparin was started conservatively with no bolus which subsequently led to recurrent bleeding issues. DDAVP and TXA nebs given 1/8, heparin held since 1/6.  Bleeding issues have temporized for now, Hgb 8.2 mg/dl. Discussed with CCM, will retrial heparin today. Pt previously on 1700 units/h, will resume at lower rate and adjust conservatively.  Heparin level undetectable on 1200 units/hr. Minimal bloody ETT secretions -  resolving.  Goal of Therapy:  Heparin level 0.3-0.5 units/ml Monitor platelets by anticoagulation protocol: Yes   Plan:  Increase heparin to 1450 units/h - being cautious with recent bleeding but will likely need close to 1700 units/hr Check heparin level in [place], PharmD, BCPS Please see amion for complete clinical pharmacist phone list 05/25/2022

## 2022-05-25 NOTE — Progress Notes (Signed)
Patient ID: Jonette Pesa., male   DOB: 06-25-1960, 62 y.o.   MRN: [id number] [facility] Progress Note   Assessment/ Plan:   1. Acute kidney Injury: Normal renal function at baseline and appears to have suffered dense ATN/severe AKI in the setting of cardiac arrest.  Remains polyuric with ongoing diuresis and likely from the effect of associated hyperglycemia +/- diuretic ATN recovery phase.  Will decrease furosemide dose down to 80 mg twice daily.  Labs indicative of ongoing renal recovery. 2.  Status postcardiac arrest with persistent shock: Combined septic/cardiogenic: With recurrent PE possibly secondary to missed doses of Xarelto; treated with alteplase empirically.  Remains on pressor support. 3.  Acute exacerbation of congestive heart failure: With evidence of some pulmonary edema on initial lung imaging and continues to respond well with ongoing diuresis.  Net negative from a fluid standpoint overnight but still with evidence of volume excess on exam. 4.  Aspiration pneumonia/healthcare associated pneumonia: On Unasyn.  Remains ventilator dependent with shock. 5.  Anemia: Compounded earlier by groin hematoma status post alteplase and likely also secondary to critical illness.  Continue to monitor for overt loss and indications for PRBC as needed. 6.  Hypernatremia: Secondary to polyuria/free water deficit.  Begin free water flushes at 200 cc every 4 hours (1.2 L / 24 hours)  Subjective:   He again had atrial fibrillation with rapid ventricular response noted overnight managed with amiodarone/potassium replacement.   Objective:   BP (!) 132/58   Pulse 100   Temp 98.6 F (37 C)   Resp (!) 21   Ht 6\' 4"  (1.93 m)   Wt (!) 160.6 kg   SpO2 97%   BMI 43.10 kg/m   Intake/Output Summary (Last 24 hours) at 05/25/2022 0804 Last data filed at 05/25/2022 0600 Gross per 24 hour  Intake 2727.08 ml  Output 6380 ml  Net -3652.92 ml   Weight change: -4.8 kg  Physical  Exam: Gen: Morbidly obese man, intubated and resting comfortably CVS: Pulse regular tachycardia, S1 and S2 normal Resp: Anteriorly coarse breath sounds bilaterally without distinct rales or rhonchi Abd: Soft, obese, nontender, bowel sounds normal Ext: Chronic bilateral lower extremity hyperpigmentation with 2+ pitting edema.  Legs in supportive boots.  Imaging: No results found.  Labs: BMET Recent Labs  Lab 05/20/22 [phone] 05/20/22 0628 05/20/22 1957 05/21/22 0311 05/21/22 0634 05/22/22 0144 05/23/22 0345 05/23/22 1534 05/24/22 0539 05/24/22 1523 05/25/22 0304  NA 139  --  141 141 142 142 143  --  142  --  146*  K 4.2   < > 4.1 4.0 4.3 4.2 3.8  --  3.8  --  3.5  CL 102  --  102  --  100 101 101  --  99  --  99  CO2 20*  --  21*  --  22 22 24   --  24  --  29  GLUCOSE 170*  --  145*  --  145* 164* 229*  --  284*  --  438*  BUN 130*  --  134*  --  137* 136* 132*  --  131*  --  134*  CREATININE 9.21*  --  9.62*  --  9.50* 9.46* 9.31*  --  8.56*  --  7.68*  CALCIUM 6.6*  --  6.5*  --  6.4* 6.5* 6.7*  --  6.7*  --  7.2*  PHOS  --   --   --   --   --  7.9*  --  6.2* 6.1* 5.7* 4.8*   < > = values in this interval not displayed.   CBC Recent Labs  Lab 05/20/22 0627 05/21/22 0311 05/22/22 0144 05/23/22 0345 05/24/22 0539 05/25/22 0304  WBC 12.2*   < > 16.3* 19.2* 19.0* 16.2*  NEUTROABS 10.5*  --   --   --   --   --   HGB 7.9*   < > 7.8* 8.3* 8.2* 8.2*  HCT 24.6*   < > 24.7* 26.0* 25.8* 26.0*  MCV 94.6   < > 96.1 95.2 95.2 96.3  PLT 129*   < > 191 236 244 211   < > = values in this interval not displayed.    Medications:     sodium chloride   Intravenous Once   budesonide (PULMICORT) nebulizer solution  0.5 mg Nebulization BID   Chlorhexidine Gluconate Cloth  6 each Topical Daily   docusate  100 mg Per Tube BID   feeding supplement (PROSource TF20)  60 mL Per Tube TID   free water  200 mL Per Tube Q4H   furosemide  80 mg Intravenous Q12H   ipratropium  0.5 mg  Nebulization Q6H   lactulose  20 g Per Tube TID   levalbuterol  0.63 mg Nebulization Q6H   levothyroxine  200 mcg Per Tube QAC breakfast   metoCLOPramide (REGLAN) injection  5 mg Intravenous Q6H   midodrine  10 mg Per Tube Q8H   multivitamin  1 tablet Per Tube Daily   mouth rinse  15 mL Mouth Rinse Q2H   pantoprazole (PROTONIX) IV  40 mg Intravenous Q12H   polyethylene glycol  17 g Per Tube BID   sennosides  15 mL Per Tube BID    Elmarie Shiley, MD 05/25/2022, 8:04 AM

## 2022-05-25 NOTE — Progress Notes (Signed)
[facility] Progress Note Patient Name: Frank Shelton. DOB: 1960/08/13 MRN: [id number]   Date of Service  05/25/2022  HPI/Events of Note  AFIB with RVR - Ventricular rate = 133. Currently on an Amiodarone IV infusion. K+ 3.5, Mg++ = 2.3 and Creatinine = 7.68. Patient being diuresed with Lasix 80 mg IV Q 8 hours.  eICU Interventions  Plan: Bolus with Amiodarone 150 mg IV over 10 minutes.  Will replace K+.     Intervention Category Major Interventions: Arrhythmia - evaluation and management  Oisin Yoakum [place] 05/25/2022, 5:14 AM

## 2022-05-25 NOTE — Progress Notes (Signed)
[facility] Progress Note Patient Name: Frank Shelton. DOB: 08-09-1960 MRN: [id number]   Date of Service  05/25/2022  HPI/Events of Note  Hypokalemia - K+ = 3.0 and Creatinine = 7.27. Patient being diuresed with Lasix 80 mg IV Q 8 hours.   eICU Interventions  Plan: Will replace K+.     Intervention Category Major Interventions: Electrolyte abnormality - evaluation and management  Shourya Macpherson [place] 05/25/2022, 10:01 PM

## 2022-05-25 NOTE — Progress Notes (Signed)
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 but missed a few doses over Christmas and presented 12/28 with recurrent PE in setting of  DVT >L> R on Dopplers with RV/LV 1.2 no change from CT May 23, 2020 and echo ok but coded am 19-May-2023 and transferred to ICU where  given Altepase empirically and noted to have R groin hematoma at sight of R CVL during code with one arterial stick and hgb dropping therefafter with neg GI source to date but all anticoagulation held and PCCM service asked to see pending transfer to [facility] when bed available with worsening uop/increasing K and creat developing while still on pressors and full vent support.   Pertinent  Medical History  obesity, T2DM, HTN, gout, COPD, hyperlipidemia. He also has a history of previous DVT/PE on xarelto.   Significant Hospital Events: Including procedures, antibiotic start and stop dates in addition to other pertinent events   05-19-23: Cardiac arrest while in BR. CPR  initiated. TNK administered during CPR, fem cvl  placed/ post arrest shock, high dose pressors. Failed attempt on right femoral and right IJ access. Time to ROSC estimated over 30 minutes. Hgb dropped 12 to 8.8  12/31 on 3 pressors and stress dose steroids. In DKA. Insulin gtt started. LMWH placed on hold.  MRSA PCR screen 2023/05/19 > neg  BC x 2 1/1  > one /two pos gpc + staph hominis. Unasyn started.  1/3 transferred to [facility] central venous catheter removed.  Blood cultures repeated.  Repeat echo: EF 50 to 55% no regional wall abnormality grade 1 diastolic dysfunction RV normal Renal function improving with Lasix.  Weaning pressors.  Adding Seroquel and clonazepam to assist with weaning 1/7 ETT exchange, bronch for ongoing hemoptysis 1/10 no hemoptysis; resuming  heparin today; off levo   Interim History / Subjective:  1/9 sedation held; still no waking up but responds to painful stimuli and cough/gag present; perrl Overnight afib rvr rate 133 re-bolused amio K 3.5 repleted Glucose 438 started on insulin gtt CVP 3; UOP 5.8, creat 7.6 from 8.5 On vent prvc Restarted on heparin today  Objective   Blood pressure (!) 132/58, pulse (!) 103, temperature 98.6 F (37 C), resp. rate (!) 23, height 6\' 4"  (1.93 m), weight (!) 160.6 kg, SpO2 97 %. CVP:  [1 mmHg-9 mmHg] 3 mmHg  Vent Mode: PRVC FiO2 (%):  [40 %] 40 % Set Rate:  [15 bmp] 15 bmp Vt Set:  [690 mL] 690 mL PEEP:  [8 cmH20] 8 cmH20 Plateau Pressure:  [21 cmH20-25 cmH20] 25 cmH20   Intake/Output Summary (Last 24 hours) at 05/25/2022 1014 Last data filed at 05/25/2022 1000 Gross per 24 hour  Intake 3011.07 ml  Output 6480 ml  Net -3468.93 ml    Filed Weights   05/22/22 0500 05/24/22 0600 05/25/22 0615  Weight: (!) 172.1 kg (!) 165.4 kg (!) 160.6 kg    Examination: General:  critically ill appearing on mech vent HEENT: MM pink/moist; ETT in place Neuro: 1/9 sedation held; still no waking up but responds to painful stimuli and cough/gag present; perrl CV: s1s2, appears sinus rate 100, no m/r/g PULM:  dim clear BS bilaterally; on mech vent PRVC 40% 8 peep GI: soft, bsx4 active  Extremities: warm/dry, diffuse anasarca;  r groin bruising Skin: no rashes or lesions   Assessment & Plan:  Ongoing septic shock Serratia/enterobacter HCAP- with hemoptysis/hemorrhagic bronchitis Staph Hominis bacteremia- Curb sided with infectious disease, no need for TEE Plan: -off levo this am; map goal >65 -cont midodrine -continue meropenem x 7days -trend wbc/fever curve  Volume overload- improving Hx COPD/OSA Resp failure- volume overload, mental status and hemoptysis precluding extubation at present Plan: -will keep intubated due to poor mental status -LTVV strategy with tidal volumes of 6-8  cc/kg ideal body weight -Wean PEEP/FiO2 for SpO2 >92% -VAP bundle in place -Daily SAT and SBT when appropriate -PAD protocol in place -hold sedation for RASS goal 0 to -1  Acute encephalopathy: possibly sepsis related vs. Anoxic encephalopathy from pea arrest Plan: -sedation held 1/9 -limit sedating meds -check ammonia and abg (ensure no hypercarbia) -consider MRI   Acute kidney injury- polyuric on lasix; post arrest ATN Plan: -nephro following -lasix decreased 80 bid -Trend BMP / urinary output -Replace electrolytes as indicated -Avoid nephrotoxic agents, ensure adequate renal perfusion  S/P PEA arrest- from massive PE, IVC filter in place PE/DVT Plan: -resume heparin today -telemetry monitoring  Afib/aflutter Plan: -heparin gtt today -telemetry -cont amio -trend k/mag; replete for k >4 and mag >2  DM2 with hyperglycemia Plan: -insulin gtt started overnight -cbg monitoring  Ileus, hx gastroparesis- improved; last BM 1/9 Plan: -cont bowel regimen and reglan  ABLA from R groin hematoma- seems to have settled out, exam benign today (05/23/22) Hemoptysis: improved Plan: -trend cbc -resuming heparin today; monitor for signs of bleeding  Class 3 obesity Plan: -weight loss cessation counseling when appropriate   Best Practice (right click and "Reselect all SmartList Selections" daily)   Diet/type: advance to goal DVT prophylaxis: heparin gtt restarted 1/10 GI prophylaxis: PPI Lines: Central line and Arterial Line Foley:  Yes, and it is still needed Code Status:  DNR Last date of multidisciplinary goals of care discussion: 1/10 patient's son was updated at bedside  35 min cc time reviewing complex case  JD Geryl Rankins [facility] 05/25/2022, 10:52 AM  Please see [email or web address] for pager details.  From 7A-7P if no response, please call [phone]. After hours, please call ELink [phone].

## 2022-05-25 NOTE — Progress Notes (Signed)
[place] Progress Note Patient Name: Frank Shelton. DOB: [birth year] MRN: [id number]   Date of Service  05/25/2022  HPI/Events of Note  Hyperglycemia - Blood glucose = 346.   eICU Interventions  Plan: D/C SemGlee and Q 4 hour Novolog Miranda SSI. Insulin IV infusion per [employer] protocol.      Intervention Category Major Interventions: Hyperglycemia - active titration of insulin therapy  [name] [name] 05/25/2022, 2:33 AM

## 2022-05-25 NOTE — Progress Notes (Signed)
ANTICOAGULATION CONSULT NOTE   Pharmacy Consult for heparin Indication: DVT  Allergies  Allergen Reactions   Nabumetone Hives    Patient Measurements: Height: 6\' 4"  (193 cm) Weight: (!) 160.6 kg (354 lb 0.9 oz) IBW/kg (Calculated) : 86.8 HEPARIN DW (KG): 129.7   Vital Signs: Temp: 98.6 F (37 C) (01/10 0739) Temp Source: Oral (01/09 2300) BP: 132/58 (01/10 0332) Pulse Rate: 100 (01/10 0715)  Labs: Recent Labs    05/23/22 0345 05/24/22 0539 05/25/22 0304  HGB 8.3* 8.2* 8.2*  HCT 26.0* 25.8* 26.0*  PLT 236 244 211  CREATININE 9.31* 8.56* 7.68*     Estimated Creatinine Clearance: 16.2 mL/min (A) (by C-G formula based on SCr of 7.68 mg/dL (H)).   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 He is s/p TNK on 12/30 followed by hemorrhagic shock from right groin hematoma.  Heparin was started conservatively with no bolus which subsequently led to recurrent bleeding issues. DDAVP and TXA nebs given 1/8, heparin held since 1/6.  Bleeding issues have temporized for now, Hgb 8.2 mg/dl. Discussed with CCM, will retrial heparin today. Pt previously on 1700 units/h, will resume at lower rate and adjust conservatively.   Goal of Therapy:  Heparin level 0.3-0.5 units/ml Monitor platelets by anticoagulation protocol: Yes   Plan:  Resume heparin 1200 units/h Check heparin level in 8h  Arrie Senate, PharmD, [place],  [facility] Clinical Pharmacist [phone] Please check AMION for all [facility] Pharmacy numbers 05/25/2022

## 2022-05-26 DIAGNOSIS — J1569 Pneumonia due to other gram-negative bacteria: Secondary | ICD-10-CM | POA: Diagnosis not present

## 2022-05-26 DIAGNOSIS — G9340 Encephalopathy, unspecified: Secondary | ICD-10-CM

## 2022-05-26 DIAGNOSIS — N179 Acute kidney failure, unspecified: Secondary | ICD-10-CM | POA: Diagnosis not present

## 2022-05-26 DIAGNOSIS — J9601 Acute respiratory failure with hypoxia: Secondary | ICD-10-CM | POA: Diagnosis not present

## 2022-05-26 LAB — GLUCOSE, CAPILLARY
Glucose-Capillary: 131 mg/dL — ABNORMAL HIGH (ref 70–99)
Glucose-Capillary: 143 mg/dL — ABNORMAL HIGH (ref 70–99)
Glucose-Capillary: 145 mg/dL — ABNORMAL HIGH (ref 70–99)
Glucose-Capillary: 158 mg/dL — ABNORMAL HIGH (ref 70–99)
Glucose-Capillary: 164 mg/dL — ABNORMAL HIGH (ref 70–99)
Glucose-Capillary: 171 mg/dL — ABNORMAL HIGH (ref 70–99)
Glucose-Capillary: 180 mg/dL — ABNORMAL HIGH (ref 70–99)
Glucose-Capillary: 256 mg/dL — ABNORMAL HIGH (ref 70–99)
Glucose-Capillary: 300 mg/dL — ABNORMAL HIGH (ref 70–99)
Glucose-Capillary: 301 mg/dL — ABNORMAL HIGH (ref 70–99)

## 2022-05-26 LAB — BASIC METABOLIC PANEL
Anion gap: 14 (ref 5–15)
Anion gap: 13 (ref 5–15)
BUN: 132 mg/dL — ABNORMAL HIGH (ref 8–23)
BUN: 130 mg/dL — ABNORMAL HIGH (ref 8–23)
CO2: 30 mmol/L (ref 22–32)
CO2: 30 mmol/L (ref 22–32)
Calcium: 7.5 mg/dL — ABNORMAL LOW (ref 8.9–10.3)
Calcium: 7.4 mg/dL — ABNORMAL LOW (ref 8.9–10.3)
Chloride: 106 mmol/L (ref 98–111)
Chloride: 103 mmol/L (ref 98–111)
Creatinine, Ser: 6.74 mg/dL — ABNORMAL HIGH (ref 0.61–1.24)
Creatinine, Ser: 5.93 mg/dL — ABNORMAL HIGH (ref 0.61–1.24)
GFR, Estimated: 9 mL/min — ABNORMAL LOW (ref >60)
GFR, Estimated: 10 mL/min — ABNORMAL LOW (ref >60)
Glucose, Bld: 128 mg/dL — ABNORMAL HIGH (ref 70–99)
Glucose, Bld: 335 mg/dL — ABNORMAL HIGH (ref 70–99)
Potassium: 3.5 mmol/L (ref 3.5–5.1)
Potassium: 3.4 mmol/L — ABNORMAL LOW (ref 3.5–5.1)
Sodium: 150 mmol/L — ABNORMAL HIGH (ref 135–145)
Sodium: 146 mmol/L — ABNORMAL HIGH (ref 135–145)

## 2022-05-26 LAB — HEPARIN LEVEL (UNFRACTIONATED)
Heparin Unfractionated: <0.1 IU/mL — ABNORMAL LOW (ref 0.30–0.70)
Heparin Unfractionated: <0.1 [IU]/mL — ABNORMAL LOW (ref 0.30–0.70)

## 2022-05-26 LAB — CBC
HCT: 26.4 % — ABNORMAL LOW (ref 39.0–52.0)
Hemoglobin: 8.2 g/dL — ABNORMAL LOW (ref 13.0–17.0)
MCH: 30.6 pg (ref 26.0–34.0)
MCHC: 31.1 g/dL (ref 30.0–36.0)
MCV: 98.5 fL (ref 80.0–100.0)
Platelets: 197 10*3/uL (ref 150–400)
RBC: 2.68 MIL/uL — ABNORMAL LOW (ref 4.22–5.81)
RDW: 19.7 % — ABNORMAL HIGH (ref 11.5–15.5)
WBC: 15.7 10*3/uL — ABNORMAL HIGH (ref 4.0–10.5)
nRBC: 0.3 % — ABNORMAL HIGH (ref 0.0–0.2)

## 2022-05-26 LAB — POTASSIUM: Potassium: 3.3 mmol/L — ABNORMAL LOW (ref 3.5–5.1)

## 2022-05-26 LAB — MAGNESIUM: Magnesium: 2.3 mg/dL (ref 1.7–2.4)

## 2022-05-26 LAB — PHOSPHORUS: Phosphorus: 3.1 mg/dL (ref 2.5–4.6)

## 2022-05-26 MED ORDER — FREE WATER
300.0000 mL | Status: DC
  Administered 2022-05-26 – 2022-06-01 (×37): 300 mL

## 2022-05-26 MED ORDER — INSULIN ASPART 100 UNIT/ML IJ SOLN
0.0000 [IU] | INTRAMUSCULAR | Status: DC
  Administered 2022-05-26 (×2): 15 [IU] via SUBCUTANEOUS
  Administered 2022-05-27: 11 [IU] via SUBCUTANEOUS
  Administered 2022-05-27: 15 [IU] via SUBCUTANEOUS
  Administered 2022-05-27: 7 [IU] via SUBCUTANEOUS
  Administered 2022-05-27 – 2022-05-28 (×4): 11 [IU] via SUBCUTANEOUS
  Administered 2022-05-28: 7 [IU] via SUBCUTANEOUS
  Administered 2022-05-28 (×2): 11 [IU] via SUBCUTANEOUS
  Administered 2022-05-28: 7 [IU] via SUBCUTANEOUS
  Administered 2022-05-28: 4 [IU] via SUBCUTANEOUS
  Administered 2022-05-29: 7 [IU] via SUBCUTANEOUS
  Administered 2022-05-29: 4 [IU] via SUBCUTANEOUS
  Administered 2022-05-29: 11 [IU] via SUBCUTANEOUS
  Administered 2022-05-29: 15 [IU] via SUBCUTANEOUS
  Administered 2022-05-29 (×2): 4 [IU] via SUBCUTANEOUS
  Administered 2022-05-30: 11 [IU] via SUBCUTANEOUS
  Administered 2022-05-30 (×4): 15 [IU] via SUBCUTANEOUS
  Administered 2022-05-31: 20 [IU] via SUBCUTANEOUS
  Administered 2022-05-31: 11 [IU] via SUBCUTANEOUS
  Administered 2022-05-31: 20 [IU] via SUBCUTANEOUS
  Administered 2022-05-31: 11 [IU] via SUBCUTANEOUS
  Administered 2022-05-31: 15 [IU] via SUBCUTANEOUS
  Administered 2022-06-01: 20 [IU] via SUBCUTANEOUS
  Administered 2022-06-01: 15 [IU] via SUBCUTANEOUS
  Administered 2022-06-01 (×3): 20 [IU] via SUBCUTANEOUS
  Administered 2022-06-01: 15 [IU] via SUBCUTANEOUS
  Administered 2022-06-02: 11 [IU] via SUBCUTANEOUS
  Administered 2022-06-02: 7 [IU] via SUBCUTANEOUS
  Administered 2022-06-02: 20 [IU] via SUBCUTANEOUS
  Administered 2022-06-02: 15 [IU] via SUBCUTANEOUS
  Administered 2022-06-02: 20 [IU] via SUBCUTANEOUS
  Administered 2022-06-03: 15 [IU] via SUBCUTANEOUS
  Administered 2022-06-03 (×3): 11 [IU] via SUBCUTANEOUS
  Administered 2022-06-03: 15 [IU] via SUBCUTANEOUS
  Administered 2022-06-03: 11 [IU] via SUBCUTANEOUS
  Administered 2022-06-04 (×2): 7 [IU] via SUBCUTANEOUS
  Administered 2022-06-04: 11 [IU] via SUBCUTANEOUS
  Administered 2022-06-04: 4 [IU] via SUBCUTANEOUS
  Administered 2022-06-04: 15 [IU] via SUBCUTANEOUS
  Administered 2022-06-04 – 2022-06-05 (×5): 7 [IU] via SUBCUTANEOUS
  Administered 2022-06-05 (×2): 4 [IU] via SUBCUTANEOUS
  Administered 2022-06-06: 11 [IU] via SUBCUTANEOUS
  Administered 2022-06-06: 7 [IU] via SUBCUTANEOUS

## 2022-05-26 MED ORDER — INSULIN ASPART 100 UNIT/ML IJ SOLN
3.0000 [IU] | INTRAMUSCULAR | Status: DC
  Administered 2022-05-26: 9 [IU] via SUBCUTANEOUS
  Administered 2022-05-26: 6 [IU] via SUBCUTANEOUS
  Administered 2022-05-26: 9 [IU] via SUBCUTANEOUS

## 2022-05-26 MED ORDER — POTASSIUM CHLORIDE 20 MEQ PO PACK
40.0000 meq | PACK | Freq: Once | ORAL | Status: AC
  Administered 2022-05-26: 40 meq
  Filled 2022-05-26: qty 2

## 2022-05-26 MED ORDER — INSULIN DETEMIR 100 UNIT/ML ~~LOC~~ SOLN
40.0000 [IU] | Freq: Two times a day (BID) | SUBCUTANEOUS | Status: DC
  Administered 2022-05-26 – 2022-05-27 (×3): 40 [IU] via SUBCUTANEOUS
  Filled 2022-05-26 (×4): qty 0.4

## 2022-05-26 MED ORDER — FUROSEMIDE 10 MG/ML IJ SOLN
60.0000 mg | Freq: Two times a day (BID) | INTRAMUSCULAR | Status: DC
  Administered 2022-05-26 – 2022-05-28 (×5): 60 mg via INTRAVENOUS
  Filled 2022-05-26 (×5): qty 6

## 2022-05-26 MED ORDER — DEXTROSE 10 % IV SOLN: INTRAVENOUS | Status: DC | PRN

## 2022-05-26 MED ORDER — INSULIN ASPART 100 UNIT/ML IJ SOLN
13.0000 [IU] | INTRAMUSCULAR | Status: DC
  Administered 2022-05-26 – 2022-05-27 (×6): 13 [IU] via SUBCUTANEOUS

## 2022-05-26 MED ORDER — FENTANYL CITRATE PF 50 MCG/ML IJ SOSY
25.0000 ug | PREFILLED_SYRINGE | INTRAMUSCULAR | Status: DC | PRN
  Administered 2022-05-27 – 2022-05-28 (×5): 50 ug via INTRAVENOUS
  Filled 2022-05-26 (×5): qty 1

## 2022-05-26 MED FILL — Midazolam HCl Inj 5 MG/5ML (Base Equivalent): INTRAMUSCULAR | Qty: 5 | Status: AC

## 2022-05-26 NOTE — Progress Notes (Signed)
[place] Progress Note Patient Name: Frank Shelton. DOB: 02-08-1961 MRN: [id number]   Date of Service  05/26/2022  HPI/Events of Note  Hyperglycemia - Blood glucose = 300. Patient is on a Levemir + Q 4 hours resistant Novolog SSI + Q 4 hour Novolog tube feed coverage.   eICU Interventions  Plan: Will increase Q 4 hour resistant Novolog SSI coverage up to blood glucose = 400.     Intervention Category Major Interventions: Hyperglycemia - active titration of insulin therapy  Lysle Dingwall 05/26/2022, 8:57 PM

## 2022-05-26 NOTE — Progress Notes (Signed)
[place] Progress Note Patient Name: Frank Shelton. DOB: [birth year] MRN: [id number]   Date of Service  05/26/2022  HPI/Events of Note  Hypokalemia - K+ = 3.4 and Creatinine = 5.93. Patient is being diuresed with Lasix 60 mg IV Q 12 hours.   eICU Interventions  Will replace K+.      Intervention Category Major Interventions: Electrolyte abnormality - evaluation and management  Lysle Dingwall 05/26/2022, 9:47 PM

## 2022-05-26 NOTE — Progress Notes (Signed)
Patient ID: Frank Pesa., male   DOB: 09/06/1960, 62 y.o.   MRN: [id number] [facility] Progress Note   Assessment/ Plan:   1. Acute kidney Injury: Normal renal function at baseline and appears to have suffered dense ATN/severe AKI in the setting of cardiac arrest.  He has sluggish improvement of BUN/creatinine however, trajectory remains consistently downwards.  His rate of urine output slowed down with decreased furosemide dose.  CVP 10 cm water. 2.  Status postcardiac arrest with persistent shock: Combined septic/cardiogenic: With recurrent PE possibly secondary to missed doses of Xarelto; treated with alteplase empirically.  Remains on pressor support. 3.  Acute exacerbation of congestive heart failure: With evidence of some pulmonary edema on initial lung imaging and continues to respond well with ongoing diuresis.  Remains consistently net negative from a fluid standpoint. 4.  Aspiration pneumonia/healthcare associated pneumonia: On Unasyn.  Remains ventilator dependent with shock. 5.  Anemia: Compounded earlier by groin hematoma status post alteplase and likely also secondary to critical illness.  Continue to monitor for overt loss and indications for PRBC as needed. 6.  Hypernatremia: Secondary to polyuria/free water deficit in the setting of ongoing diuresis and limited free water excess.  Will increase free water flushes to 300 cc every 4 hours and decrease furosemide dose to 60 mg twice daily.  Subjective:   He is showing increased awakening/responsiveness overnight.   Objective:   BP (!) 111/53   Pulse 89   Temp 97.7 F (36.5 C) (Axillary)   Resp 16   Ht 6\' 4"  (1.93 m)   Wt (!) 158.5 kg   SpO2 100%   BMI 42.53 kg/m   Intake/Output Summary (Last 24 hours) at 05/26/2022 0742 Last data filed at 05/26/2022 0600 Gross per 24 hour  Intake 3944.99 ml  Output 3825 ml  Net 119.99 ml   Weight change: -2.1 kg  Physical Exam: Gen: Morbidly obese man, intubated  and resting comfortably.  Attempts to open eyes to calling out his name CVS: Pulse regular tachycardia, S1 and S2 normal Resp: Anteriorly coarse breath sounds bilaterally without distinct rales or rhonchi Abd: Soft, obese, nontender, bowel sounds normal Ext: Chronic bilateral lower extremity hyperpigmentation with 1-2+ pitting edema.  Legs in supportive boots.  No upper extremity edema noted  Imaging: No results found.  Labs: BMET Recent Labs  Lab 05/21/22 0634 05/22/22 0144 05/23/22 0345 05/23/22 1534 05/24/22 0539 05/24/22 1523 05/25/22 0304 05/25/22 1246 05/25/22 1800 05/25/22 1858 05/26/22 0303  NA 142 142 143  --  142  --  146* 149*  --  149* 150*  K 4.3 4.2 3.8  --  3.8  --  3.5 3.4*  --  3.0* 3.5  CL 100 101 101  --  99  --  99  --   --  103 106  CO2 22 22 24   --  24  --  29  --   --  29 30  GLUCOSE 145* 164* 229*  --  284*  --  438*  --   --  183* 128*  BUN 137* 136* 132*  --  131*  --  134*  --   --  136* 132*  CREATININE 9.50* 9.46* 9.31*  --  8.56*  --  7.68*  --   --  7.27* 6.74*  CALCIUM 6.4* 6.5* 6.7*  --  6.7*  --  7.2*  --   --  7.4* 7.5*  PHOS  --  7.9*  --  6.2*  6.1* 5.7* 4.8*  --  3.5  --  3.1   CBC Recent Labs  Lab 05/20/22 0627 05/21/22 0311 05/23/22 0345 05/24/22 0539 05/25/22 0304 05/25/22 1246 05/26/22 0303  WBC 12.2*   < > 19.2* 19.0* 16.2*  --  15.7*  NEUTROABS 10.5*  --   --   --   --   --   --   HGB 7.9*   < > 8.3* 8.2* 8.2* 9.2* 8.2*  HCT 24.6*   < > 26.0* 25.8* 26.0* 27.0* 26.4*  MCV 94.6   < > 95.2 95.2 96.3  --  98.5  PLT 129*   < > 236 244 211  --  197   < > = values in this interval not displayed.    Medications:     sodium chloride   Intravenous Once   budesonide (PULMICORT) nebulizer solution  0.5 mg Nebulization BID   Chlorhexidine Gluconate Cloth  6 each Topical Daily   docusate  100 mg Per Tube BID   feeding supplement (PROSource TF20)  60 mL Per Tube TID   free water  200 mL Per Tube Q4H   furosemide  80 mg  Intravenous Q12H   ipratropium  0.5 mg Nebulization Q6H   lactulose  20 g Per Tube TID   levalbuterol  0.63 mg Nebulization Q6H   levothyroxine  200 mcg Per Tube QAC breakfast   metoCLOPramide (REGLAN) injection  5 mg Intravenous Q6H   midodrine  10 mg Per Tube Q8H   multivitamin  1 tablet Per Tube Daily   mouth rinse  15 mL Mouth Rinse Q2H   pantoprazole (PROTONIX) IV  40 mg Intravenous Q12H   polyethylene glycol  17 g Per Tube BID   sennosides  15 mL Per Tube BID    Elmarie Shiley, MD 05/26/2022, 7:42 AM

## 2022-05-26 NOTE — Progress Notes (Incomplete)
ANTICOAGULATION CONSULT NOTE - Follow Up Consult  Pharmacy Consult for heparin Indication: DVT  Allergies  Allergen Reactions   Nabumetone Hives    Patient Measurements: Height: 6\' 4"  (193 cm) Weight: (!) 158.5 kg (349 lb 6.9 oz) IBW/kg (Calculated) : 86.8 Heparin Dosing Weight: 115 kg  Vital Signs: Temp: 98.2 F (36.8 C) (01/11 1133) Temp Source: Axillary (01/11 1133) BP: 136/64 (01/11 0802) Pulse Rate: 102 (01/11 1300)  Labs: Recent Labs    05/24/22 0539 05/25/22 0304 05/25/22 1246 05/25/22 1800 05/25/22 1858 05/26/22 0300 05/26/22 0303 05/26/22 1353  HGB 8.2* 8.2* 9.2*  --   --   --  8.2*  --   HCT 25.8* 26.0* 27.0*  --   --   --  26.4*  --   PLT 244 211  --   --   --   --  197  --   HEPARINUNFRC  --   --   --  <0.10*  --  <0.10*  --  <0.10*  CREATININE 8.56* 7.68*  --   --  7.27*  --  6.74*  --     Estimated Creatinine Clearance: 18.3 mL/min (A) (by C-G formula based on SCr of 6.74 mg/dL (H)).  Assessment: 62 yo male with recurrent PE/DVT on rivaroxaban PTA for history of PE/DVT. He is s/p TNK on 12/30 followed by hemorrhagic shock from right groin hematoma.  On 1/5, heparin was started conservatively with no bolus which subsequently led to recurrent bleeding issues. DDAVP and TXA nebs given 1/8, heparin held since 1/6.   Bleeding issues have temporized for now, Hgb 8.2 mg/dl. Discussed with CCM, will retrial heparin today. Pt previously on 1700 units/h, was resumed at lower rate and continuing to adjust conservatively.   Heparin level undetectable on 1650 units/hr. Minimal bloody ETT secretions - resolving.  Goal of Therapy:  Heparin level 0.3-0.5 units/ml Monitor platelets by anticoagulation protocol: Yes   Plan:  Increase heparin infusion rate to 1900 units/hr Recheck heparin level in 8 hours (2300) Monitor s/sx bleeding, hemoglobin, and platelets daily  Freddie Breech 05/26/2022,2:58 PM

## 2022-05-26 NOTE — Progress Notes (Signed)
ANTICOAGULATION CONSULT NOTE   Pharmacy Consult for heparin Indication: DVT  Allergies  Allergen Reactions   Nabumetone Hives    Patient Measurements: Height: 6\' 4"  (193 cm) Weight: (!) 160.6 kg (354 lb 0.9 oz) IBW/kg (Calculated) : 86.8 HEPARIN DW (KG): 129.7   Vital Signs: Temp: 98.2 F (36.8 C) (01/10 2300) Temp Source: Oral (01/10 2300) Pulse Rate: 93 (01/11 0300)  Labs: Recent Labs    05/24/22 0539 05/25/22 0304 05/25/22 1246 05/25/22 1800 05/25/22 1858 05/26/22 0300 05/26/22 0303  HGB 8.2* 8.2* 9.2*  --   --   --  8.2*  HCT 25.8* 26.0* 27.0*  --   --   --  26.4*  PLT 244 211  --   --   --   --  197  HEPARINUNFRC  --   --   --  <0.10*  --  <0.10*  --   CREATININE 8.56* 7.68*  --   --  7.27*  --  6.74*     Estimated Creatinine Clearance: 18.5 mL/min (A) (by C-G formula based on SCr of 6.74 mg/dL (H)).   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 He is s/p TNK on 12/30 followed by hemorrhagic shock from right groin hematoma.  On 1/5, heparin was started conservatively with no bolus which subsequently led to recurrent bleeding issues. DDAVP and TXA nebs given 1/8, heparin held since 1/6.  Bleeding issues have temporized for now, Hgb 8.2 mg/dl. Discussed with CCM, will retrial heparin today. Pt previously on 1700 units/h, will resume at lower rate and adjust conservatively.  Heparin level undetectable  on 1200 units/hr. Minimal bloody ETT secretions - resolving.  1/11 AM update: Heparin level sub-therapeutic   Goal of Therapy:  Heparin level 0.3-0.5 units/ml Monitor platelets by anticoagulation protocol: Yes   Plan:  Inc heparin to 1650 units/hr 1300 heparin level  Narda Bonds, PharmD, BCPS Clinical Pharmacist Phone: [phone]

## 2022-05-26 NOTE — Progress Notes (Signed)
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 but missed a few doses over Christmas and presented 12/28 with recurrent PE in setting of  DVT >L> R on Dopplers with RV/LV 1.2 no change from CT 06-09-2020 and echo ok but coded am 2023/06/06 and transferred to ICU where  given Altepase empirically and noted to have R groin hematoma at sight of R CVL during code with one arterial stick and hgb dropping therefafter with neg GI source to date but all anticoagulation held and PCCM service asked to see pending transfer to [facility] when bed available with worsening uop/increasing K and creat developing while still on pressors and full vent support.   Pertinent  Medical History  obesity, T2DM, HTN, gout, COPD, hyperlipidemia. He also has a history of previous DVT/PE on xarelto.   Significant Hospital Events: Including procedures, antibiotic start and stop dates in addition to other pertinent events   June 06, 2023: Cardiac arrest while in BR. CPR  initiated. TNK administered during CPR, fem cvl  placed/ post arrest shock, high dose pressors. Failed attempt on right femoral and right IJ access. Time to ROSC estimated over 30 minutes. Hgb dropped 12 to 8.8  12/31 on 3 pressors and stress dose steroids. In DKA. Insulin gtt started. LMWH placed on hold.  MRSA PCR screen 06-06-2023 > neg  BC x 2 1/1  > one /two pos gpc + staph hominis. Unasyn started.  1/3 transferred to [facility] central venous catheter removed.  Blood cultures repeated.  Repeat echo: EF 50 to 55% no regional wall abnormality grade 1 diastolic dysfunction RV normal Renal function improving with Lasix.  Weaning pressors.  Adding Seroquel and clonazepam to assist with weaning 1/7 ETT exchange, bronch for ongoing hemoptysis 1/10 no hemoptysis; resuming  heparin today; off levo 1/11 starting to wake up a bit, nephro decr lasix and incr FWF    Interim History / Subjective:   NAEO Remains off continuous sedation Renal fxn slightly improved   Tried SBT this morning -- did well for about 10 min then tachypnea   Following some commands   Objective   Blood pressure 136/64, pulse 96, temperature 98.2 F (36.8 C), temperature source Axillary, resp. rate (!) 22, height 6\' 4"  (1.93 m), weight (!) 158.5 kg, SpO2 100 %. CVP:  [4 mmHg-19 mmHg] 19 mmHg  Vent Mode: PRVC FiO2 (%):  [35 %-40 %] 40 % Set Rate:  [15 bmp] 15 bmp Vt Set:  [690 mL] 690 mL PEEP:  [5 cmH20-8 cmH20] 5 cmH20 Plateau Pressure:  [20 cmH20-22 cmH20] 22 cmH20   Intake/Output Summary (Last 24 hours) at 05/26/2022 1248 Last data filed at 05/26/2022 1034 Gross per 24 hour  Intake 3080.6 ml  Output 4205 ml  Net -1124.4 ml   Filed Weights   05/24/22 0600 05/25/22 0615 05/26/22 0500  Weight: (!) 165.4 kg (!) 160.6 kg (!) 158.5 kg    Examination: General:  Critically and chronically ill M intubated NAD  HEENT: NCAT pink mm ETT secure Neuro: Wiggled fingers to command  CV: rrr distant heart sounds  PULM:  Mechanically ventilated  GI: obese soft ndnt  Extremities: BLE edema  Skin: c/d/w  Assessment & Plan:   Acute encephalopathy -AKI uremia, septic shock, delirium, medications, cardiac arrest  -  only very mild hyperammonemia, doubt significantly contributing  Plan: -hold sedation, allow time for washout with renal fxn  -delirium precautions  PE/ DVT PEA arrest -s/p IVC filter  Plan: -hep resumed 1/10   Acute respiratory failure with hypoxia Hx COPD, OSA  Hemoptysis - improved  Plan: -WUA/Sbt qD -- failed 1/11 with tachypnea  -Wean PEEP/FiO2 for SpO2 >92% -VAP, pulm hygiene  -pulmicort, atrovent, xopenex  -with recent hep gtt restart, watching for hemoptysis recurrence closely  Septic shock  Serration marcescens HCAP  PNA Enterobacter cloacae HCAP  PNA Staph hominis bacteremia  Plan: -cont midodrine -MAP goal > 65 -- off NE  -continue meropenem x 7days -trend wbc/fever curve -no TEE (ID was curbsided re this)   AKI Hypernatremia  - polyuric on lasix; post arrest ATN Plan: -nephro following -decr lasix to 60mg  BID -incr FWF 1/11   Afib / Aflutter Plan: -hep gtt  -cont amio -K goal 4 mag goal 2  ABLA - stable  R groin hematoma, iatrogenic r/t arterial puncture  Plan: -trend cbc -monitor for signs of bleeding with resuming hep gtt   DM2 with hyperglycemia Plan: -trying to transition off insulin gtt 1/11   hx gastroparesis (likely 2/2 DM)  Plan: -cont bowel regimen and reglan  Class 3 obesity Plan: -weight loss cessation counseling when appropriate   Best Practice (right click and "Reselect all SmartList Selections" daily)   Diet/type: advance to goal DVT prophylaxis: heparin gtt restarted 1/10 GI prophylaxis: PPI Lines: Central line and Arterial Line Foley:  Yes, and it is still needed Code Status:  DNR Last date of multidisciplinary goals of care discussion: 1/10 patient's son was updated at bedside  CRITICAL CARE Performed by: Cristal Generous   Total critical care time: 38 minutes  Critical care time was exclusive of separately billable procedures and treating other patients. Critical care was necessary to treat or prevent imminent or life-threatening deterioration.  Critical care was time spent personally by me on the following activities: development of treatment plan with patient and/or surrogate as well as nursing, discussions with consultants, evaluation of patient's response to treatment, examination of patient, obtaining history from patient or surrogate, ordering and performing treatments and interventions, ordering and review of laboratory studies, ordering and review of radiographic studies, pulse oximetry and re-evaluation of patient's condition.  Eliseo Gum MSN, AGACNP-BC [place] for pager  05/26/2022, 12:48 PM

## 2022-05-27 ENCOUNTER — Inpatient Hospital Stay (HOSPITAL_COMMUNITY): Payer: Medicare HMO

## 2022-05-27 DIAGNOSIS — R6520 Severe sepsis without septic shock: Secondary | ICD-10-CM

## 2022-05-27 DIAGNOSIS — I2694 Multiple subsegmental pulmonary emboli without acute cor pulmonale: Secondary | ICD-10-CM | POA: Diagnosis not present

## 2022-05-27 DIAGNOSIS — N179 Acute kidney failure, unspecified: Secondary | ICD-10-CM | POA: Diagnosis not present

## 2022-05-27 DIAGNOSIS — A419 Sepsis, unspecified organism: Secondary | ICD-10-CM

## 2022-05-27 DIAGNOSIS — G9340 Encephalopathy, unspecified: Secondary | ICD-10-CM | POA: Diagnosis not present

## 2022-05-27 LAB — BASIC METABOLIC PANEL
Anion gap: 14 (ref 5–15)
BUN: 129 mg/dL — ABNORMAL HIGH (ref 8–23)
CO2: 29 mmol/L (ref 22–32)
Calcium: 7.5 mg/dL — ABNORMAL LOW (ref 8.9–10.3)
Chloride: 105 mmol/L (ref 98–111)
Creatinine, Ser: 5.71 mg/dL — ABNORMAL HIGH (ref 0.61–1.24)
GFR, Estimated: 10 mL/min — ABNORMAL LOW (ref >60)
Glucose, Bld: 259 mg/dL — ABNORMAL HIGH (ref 70–99)
Potassium: 3.7 mmol/L (ref 3.5–5.1)
Sodium: 148 mmol/L — ABNORMAL HIGH (ref 135–145)

## 2022-05-27 LAB — CBC
HCT: 27.9 % — ABNORMAL LOW (ref 39.0–52.0)
HCT: 27.9 % — ABNORMAL LOW (ref 39.0–52.0)
Hemoglobin: 8.8 g/dL — ABNORMAL LOW (ref 13.0–17.0)
Hemoglobin: 8.6 g/dL — ABNORMAL LOW (ref 13.0–17.0)
MCH: 31 pg (ref 26.0–34.0)
MCH: 30.7 pg (ref 26.0–34.0)
MCHC: 31.5 g/dL (ref 30.0–36.0)
MCHC: 30.8 g/dL (ref 30.0–36.0)
MCV: 98.2 fL (ref 80.0–100.0)
MCV: 99.6 fL (ref 80.0–100.0)
Platelets: 203 10*3/uL (ref 150–400)
Platelets: 212 10*3/uL (ref 150–400)
RBC: 2.84 MIL/uL — ABNORMAL LOW (ref 4.22–5.81)
RBC: 2.8 MIL/uL — ABNORMAL LOW (ref 4.22–5.81)
RDW: 19.7 % — ABNORMAL HIGH (ref 11.5–15.5)
RDW: 19.8 % — ABNORMAL HIGH (ref 11.5–15.5)
WBC: 13.4 10*3/uL — ABNORMAL HIGH (ref 4.0–10.5)
WBC: 14.3 10*3/uL — ABNORMAL HIGH (ref 4.0–10.5)
nRBC: 0.3 % — ABNORMAL HIGH (ref 0.0–0.2)
nRBC: 0.2 % (ref 0.0–0.2)

## 2022-05-27 LAB — RENAL FUNCTION PANEL
Albumin: 2.1 g/dL — ABNORMAL LOW (ref 3.5–5.0)
Anion gap: 16 — ABNORMAL HIGH (ref 5–15)
BUN: 128 mg/dL — ABNORMAL HIGH (ref 8–23)
CO2: 27 mmol/L (ref 22–32)
Calcium: 7.6 mg/dL — ABNORMAL LOW (ref 8.9–10.3)
Chloride: 105 mmol/L (ref 98–111)
Creatinine, Ser: 5.31 mg/dL — ABNORMAL HIGH (ref 0.61–1.24)
GFR, Estimated: 11 mL/min — ABNORMAL LOW (ref >60)
Glucose, Bld: 277 mg/dL — ABNORMAL HIGH (ref 70–99)
Phosphorus: 4.9 mg/dL — ABNORMAL HIGH (ref 2.5–4.6)
Potassium: 4 mmol/L (ref 3.5–5.1)
Sodium: 148 mmol/L — ABNORMAL HIGH (ref 135–145)

## 2022-05-27 LAB — GLUCOSE, CAPILLARY
Glucose-Capillary: 230 mg/dL — ABNORMAL HIGH (ref 70–99)
Glucose-Capillary: 252 mg/dL — ABNORMAL HIGH (ref 70–99)
Glucose-Capillary: 254 mg/dL — ABNORMAL HIGH (ref 70–99)
Glucose-Capillary: 263 mg/dL — ABNORMAL HIGH (ref 70–99)
Glucose-Capillary: 268 mg/dL — ABNORMAL HIGH (ref 70–99)
Glucose-Capillary: 304 mg/dL — ABNORMAL HIGH (ref 70–99)
Glucose-Capillary: 313 mg/dL — ABNORMAL HIGH (ref 70–99)

## 2022-05-27 LAB — HEPARIN LEVEL (UNFRACTIONATED)
Heparin Unfractionated: <0.1 IU/mL — ABNORMAL LOW (ref 0.30–0.70)
Heparin Unfractionated: <0.1 IU/mL — ABNORMAL LOW (ref 0.30–0.70)

## 2022-05-27 LAB — APTT
aPTT: 50 seconds — ABNORMAL HIGH (ref 24–36)
aPTT: 48 seconds — ABNORMAL HIGH (ref 24–36)

## 2022-05-27 MED ORDER — MIDODRINE HCL 5 MG PO TABS
5.0000 mg | ORAL_TABLET | Freq: Three times a day (TID) | ORAL | Status: DC
  Administered 2022-05-27 – 2022-05-28 (×5): 5 mg
  Filled 2022-05-27 (×5): qty 1

## 2022-05-27 MED ORDER — SODIUM CHLORIDE 0.9 % IV SOLN
0.0600 mg/kg/h | INTRAVENOUS | Status: DC
  Administered 2022-05-27: 0.05 mg/kg/h via INTRAVENOUS
  Administered 2022-05-28: 0.06 mg/kg/h via INTRAVENOUS
  Filled 2022-05-27 (×2): qty 250

## 2022-05-27 MED ORDER — DEXMEDETOMIDINE HCL IN NACL 400 MCG/100ML IV SOLN
0.0000 ug/kg/h | INTRAVENOUS | Status: DC
  Administered 2022-05-27: 0.6 ug/kg/h via INTRAVENOUS
  Administered 2022-05-27: 0.4 ug/kg/h via INTRAVENOUS
  Administered 2022-05-27: 0.7 ug/kg/h via INTRAVENOUS
  Administered 2022-05-27: 0.6 ug/kg/h via INTRAVENOUS
  Administered 2022-05-28: 0.5 ug/kg/h via INTRAVENOUS
  Administered 2022-05-28: 0.6 ug/kg/h via INTRAVENOUS
  Filled 2022-05-27 (×6): qty 100

## 2022-05-27 MED ORDER — NOREPINEPHRINE 4 MG/250ML-% IV SOLN
0.0000 ug/min | INTRAVENOUS | Status: DC
  Administered 2022-05-27: 2 ug/min via INTRAVENOUS
  Administered 2022-05-28: 4 ug/min via INTRAVENOUS
  Filled 2022-05-27 (×2): qty 250

## 2022-05-27 MED ORDER — INSULIN ASPART 100 UNIT/ML IJ SOLN
15.0000 [IU] | INTRAMUSCULAR | Status: DC
  Administered 2022-05-27 – 2022-05-29 (×12): 15 [IU] via SUBCUTANEOUS

## 2022-05-27 MED ORDER — INSULIN DETEMIR 100 UNIT/ML ~~LOC~~ SOLN
45.0000 [IU] | Freq: Two times a day (BID) | SUBCUTANEOUS | Status: DC
  Administered 2022-05-27 – 2022-05-28 (×2): 45 [IU] via SUBCUTANEOUS
  Filled 2022-05-27 (×3): qty 0.45

## 2022-05-27 NOTE — Progress Notes (Signed)
ANTICOAGULATION CONSULT NOTE   Pharmacy Consult for heparin Indication: DVT  Allergies  Allergen Reactions   Nabumetone Hives    Patient Measurements: Height: 6\' 4"  (193 cm) Weight: (!) 158.5 kg (349 lb 6.9 oz) IBW/kg (Calculated) : 86.8 HEPARIN DW (KG): 129.7   Vital Signs: Temp: 98.8 F (37.1 C) (01/11 2000) Temp Source: Axillary (01/11 2000) Pulse Rate: 102 (01/11 2000)  Labs: Recent Labs    05/24/22 0539 05/25/22 0304 05/25/22 1246 05/25/22 1800 05/25/22 1858 05/26/22 0300 05/26/22 0303 05/26/22 1353 05/26/22 2005 05/26/22 2344  HGB 8.2* 8.2* 9.2*  --   --   --  8.2*  --   --   --   HCT 25.8* 26.0* 27.0*  --   --   --  26.4*  --   --   --   PLT 244 211  --   --   --   --  197  --   --   --   HEPARINUNFRC  --   --   --    < >  --  <0.10*  --  <0.10*  --  <0.10*  CREATININE 8.56* 7.68*  --   --  7.27*  --  6.74*  --  5.93*  --    < > = values in this interval not displayed.     Estimated Creatinine Clearance: 20.8 mL/min (A) (by C-G formula based on SCr of 5.93 mg/dL (H)).   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 He is s/p TNK on 12/30 followed by hemorrhagic shock from right groin hematoma.  On 1/5, heparin was started conservatively with no bolus which subsequently led to recurrent bleeding issues. DDAVP and TXA nebs given 1/8, heparin held since 1/6.  Bleeding issues have temporized for now,  Hgb 8.2 mg/dl. Discussed with CCM, will retrial heparin today. Pt previously on 1700 units/h, will resume at lower rate and adjust conservatively.  Heparin level undetectable on 1200 units/hr. Minimal bloody ETT secretions - resolving.  1/12 AM update: Heparin level remains sub-therapeutic   Goal of Therapy:  Heparin level 0.3-0.5 units/ml Monitor platelets by anticoagulation protocol: Yes   Plan:  Inc heparin to 2200 units/hr 0900 heparin level  Narda Bonds, PharmD, BCPS Clinical Pharmacist Phone: [phone]

## 2022-05-27 NOTE — TOC Progression Note (Signed)
Transition of Care (TOC) - Progression Note    Patient Details  Name: Frank Shelton. MRN: [id number] Date of Birth: [birth year]  Transition of Care [facility]) CM/SW Contact  Graves-Bigelow, Ocie Cornfield, RN Phone Number: 05/27/2022, 12:08 PM  Clinical Narrative:  Patient was discussed in morning rounds-remains on the vent. Patient continues on IV Amio gtt. Case Manager will continue to follow for transition of care needs.     Expected Discharge Plan:  (TBD)    Expected Discharge Plan and Services In-house Referral: NA Discharge Planning Services: CM Consult   Living arrangements for the past 2 months: Single Family Home                  Social Determinants of Health (SDOH) Interventions SDOH Screenings   Food Insecurity: No Food Insecurity (05/12/2022)  Housing: Low Risk  (05/12/2022)  Transportation Needs: No Transportation Needs (05/12/2022)  Utilities: Not At Risk (05/12/2022)  Tobacco Use: Medium Risk (05/12/2022)    Readmission Risk Interventions     No data to display

## 2022-05-27 NOTE — Progress Notes (Signed)
Nutrition Follow-up  DOCUMENTATION CODES:   Morbid obesity  INTERVENTION:   Recommend considering insulin drip given persistent issues with hyperglycemia despite adjustments in insulin regimen.   Tube Feeding via Cortrak (PP):  Vital 1.5 at 60 ml/hr Pro-Source TF20 60 mL TID Goal regimen provides: 2400 kcal, 157 grams of protein, 1094 mL H2O daily     Current free water flushes of 300 mL q 4 hours with TF at goal rate provides 2894 mL of free water  Continue Renal MVI  NUTRITION DIAGNOSIS:   Inadequate oral intake related to acute illness as evidenced by NPO status.  Being addressed via TF   GOAL:   Patient will meet greater than or equal to 90% of their needs  Progressing  MONITOR:   Vent status, Labs, Weight trends, I & O's  REASON FOR ASSESSMENT:   Consult, Ventilator Enteral/tube feeding initiation and management  ASSESSMENT:   Pt admitted with chest pain and difficulty breathing, found to have multiple pulmonary emboli and LLE DVT. PMH significant for PE, DVT, DM, HTN, gout, COPD, OSA.  12/28: Admitted 12/30: Cardiac arrest, Intubated 01/03: Transferred from [facility] to [facility], TF initiated 01/05: large volume of emesis overnight and tube feeds held; Cortrak tube placed with tip in proximal duodenum and trickle tube feeds started 1/07: ETT exchange, bronch 1/08: began titration of TF to goal rate    Pt remains on vent support; mental status remains an issue, agitated overnight, required restraints and sedation  Tolerating Vital 1.5 at 60 ml/hr via Cortrak, tip post pyloric  Hypernatremic, noted free water flush increased to 300 mL q 4 hours yesterday per MD  CBGs remain >200, ICU goal 140-180. Current insulin regimen, adjusted today: ss novolog (resistant coverage), novolog 15 units q 4 hours, levemir 45 units BID. Noted pt was on insulin drip on 1/10 but transitioned off 1/11. Recommend reconsidering insulin drip for now  UOP 5 L in 24 hours. Per  I/O flow sheet, 7 L net negative NG output 960 mL in 24 hours  Current wt 161.8 kg; admit weight 169.9 kg. +moderate edema on exam. Current dry weight unknown  Labs: CBGs 230-304, sodium 148 (H), BUN 129, Creatinine 5.27 Meds: lasix  Diet Order:   Diet Order     None       EDUCATION NEEDS:   No education needs have been identified at this time  Skin:  Skin Assessment: Reviewed RN Assessment  Last BM:  1/12 type 6 medium (no rectal tube)  Height:   Ht Readings from Last 1 Encounters:  05/18/22 6\' 4"  (1.93 m)    Weight:   Wt Readings from Last 1 Encounters:  05/27/22 (!) 161.8 kg    Ideal Body Weight:  91.8 kg  BMI:  Body mass index is 43.42 kg/m.  Estimated Nutritional Needs:   Kcal:  2200-2400  Protein:  140-160g  Fluid:  1L + UOP   Kerman Passey MS, RDN, LDN, CNSC Registered Dietitian 3 Clinical Nutrition RD Pager and On-Call Pager Number Located in [place]

## 2022-05-27 NOTE — Progress Notes (Signed)
[facility] Progress Note Patient Name: Frank Shelton. DOB: [birth year] MRN: [id number]   Date of Service  05/27/2022  HPI/Events of Note  Hemoptysis - Patient back on Heparin iV infusion and nursing reports suctioning bloody secretions and clots from ETT. Spoke with pharmacy to alert them to the new finding.   eICU Interventions  Plan: Will attempt to continue the Heparin IV infusion for now. However, may need to stop Heparin IV infusion again.      Intervention Category Major Interventions: Other:  Lysle Dingwall 05/27/2022, 3:14 AM

## 2022-05-27 NOTE — Progress Notes (Signed)
ANTICOAGULATION CONSULT NOTE   Pharmacy Consult for heparin< bivalirudin Indication: DVT  Allergies  Allergen Reactions   Nabumetone Hives    Patient Measurements: Height: 6\' 4"  (193 cm) Weight: (!) 161.8 kg (356 lb 11.3 oz) IBW/kg (Calculated) : 86.8 HEPARIN DW (KG): 129.7   Vital Signs: Temp: 99.3 F (37.4 C) (01/12 1200) Temp Source: Axillary (01/12 1200) Pulse Rate: 109 (01/12 1200)  Labs: Recent Labs    05/25/22 0304 05/25/22 1246 05/25/22 1800 05/26/22 0303 05/26/22 1353 05/26/22 2005 05/26/22 2343 05/26/22 2344 05/27/22 0904  HGB 8.2* 9.2*  --  8.2*  --   --  8.8*  --   --   HCT 26.0* 27.0*  --  26.4*  --   --  27.9*  --   --   PLT 211  --   --  197  --   --  203  --   --   HEPARINUNFRC  --   --    < >  --  <0.10*  --   --  <0.10* <0.10*  CREATININE 7.68*  --    < > 6.74*  --  5.93* 5.71*  --   --    < > = values in this interval not displayed.     Estimated Creatinine Clearance: 21.9 mL/min (A) (by C-G formula based on SCr of 5.71 mg/dL (H)).   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 He is s/p TNK on 12/30 followed by hemorrhagic shock from right groin hematoma.  On 1/5, heparin was started conservatively with no bolus which subsequently led to recurrent bleeding issues. DDAVP and TXA nebs given 1/8. Heparin has been resumed and pharmacy dosing  -heparin level continues to be  undetectable -hemoptysis noted this morning and RN has noted continued bleeding at the IV line -hg= 8.8 and stable  Possible heparin resistance and plans are to change to bivalirudin   Goal of Therapy:  aPTT= 50-85 Monitor platelets by anticoagulation protocol: Yes   Plan:  -Stop heparin  -After stopping for 30 minutes, start bivalirudin at 0.05mg /kg/hr -aPTT in 4 hrs  Hildred Laser, PharmD Clinical Pharmacist **Pharmacist phone directory can now be found on [email or web address] ([email or web address]).  Listed under [place].

## 2022-05-27 NOTE — Progress Notes (Addendum)
Patient ID: Frank Pesa., male   DOB: 1960/09/23, 62 y.o.   MRN: [id number] [facility] Progress Note   Assessment/ Plan:   1. Acute kidney Injury: Normal renal function at baseline and appears to have suffered dense ATN/severe AKI in the setting of cardiac arrest.  Labs pending from this morning to assess renal function/electrolytes. 2.  Status postcardiac arrest with persistent shock: Combined septic/cardiogenic: With recurrent PE possibly secondary to missed doses of Xarelto; treated with alteplase empirically.  Remains on pressor support. 3.  Acute exacerbation of congestive heart failure: With evidence of some pulmonary edema on initial lung imaging and continues to respond well with ongoing diuresis.  Remains consistently net negative from a fluid standpoint. 4.  Aspiration pneumonia/healthcare associated pneumonia: On Unasyn.  Remains ventilator dependent with shock. 5.  Anemia: Compounded earlier by groin hematoma status post alteplase and likely also secondary to critical illness.  Continue to monitor for overt loss and indications for PRBC as needed. 6.  Hypernatremia: Secondary to polyuria/free water deficit in the setting of ongoing diuresis and limited free water excess.  Remains on free water flushes along with furosemide.  The patient maintains robust urine output and his creatinine/BUN have been slowly but reliably trending down.  I do not have anything additional to offer with his ongoing management beyond what is currently being done.  Nephrology will sign off and remain available for questions or concerns.  Subjective:   Noted to have increased agitation overnight.  Developed some bloody secretions/mucous plugging on heparin drip.   Objective:   BP 136/64   Pulse (!) 104   Temp 98.9 F (37.2 C) (Axillary)   Resp (!) 21   Ht 6\' 4"  (1.93 m)   Wt (!) 161.8 kg   SpO2 99%   BMI 43.42 kg/m   Intake/Output Summary (Last 24 hours) at 05/27/2022 0735 Last  data filed at 05/27/2022 0600 Gross per 24 hour  Intake 2893.66 ml  Output 5861 ml  Net -2967.34 ml   Weight change: 3.3 kg  Physical Exam: Gen: Morbidly obese man, intubated and resting comfortably on ventilator CVS: Pulse regular tachycardia, S1 and S2 normal Resp: Anteriorly coarse breath sounds bilaterally without distinct rales or rhonchi Abd: Soft, obese, nontender, bowel sounds normal Ext: Chronic bilateral lower extremity hyperpigmentation with 1-2+ pitting edema.  Legs in supportive boots.  No upper extremity edema noted  Imaging: No results found.  Labs: BMET Recent Labs  Lab 05/22/22 0144 05/23/22 0345 05/23/22 1534 05/24/22 0539 05/24/22 1523 05/25/22 0304 05/25/22 1246 05/25/22 1800 05/25/22 1858 05/26/22 0303 05/26/22 0857 05/26/22 2005 05/26/22 2343  NA 142 143  --  142  --  146* 149*  --  149* 150*  --  146* 148*  K 4.2 3.8  --  3.8  --  3.5 3.4*  --  3.0* 3.5 3.3* 3.4* 3.7  CL 101 101  --  99  --  99  --   --  103 106  --  103 105  CO2 22 24  --  24  --  29  --   --  29 30  --  30 29  GLUCOSE 164* 229*  --  284*  --  438*  --   --  183* 128*  --  335* 259*  BUN 136* 132*  --  131*  --  134*  --   --  136* 132*  --  130* 129*  CREATININE 9.46* 9.31*  --  8.56*  --  7.68*  --   --  7.27* 6.74*  --  5.93* 5.71*  CALCIUM 6.5* 6.7*  --  6.7*  --  7.2*  --   --  7.4* 7.5*  --  7.4* 7.5*  PHOS 7.9*  --  6.2* 6.1* 5.7* 4.8*  --  3.5  --  3.1  --   --   --    CBC Recent Labs  Lab 05/24/22 0539 05/25/22 0304 05/25/22 1246 05/26/22 0303 05/26/22 2343  WBC 19.0* 16.2*  --  15.7* 13.4*  HGB 8.2* 8.2* 9.2* 8.2* 8.8*  HCT 25.8* 26.0* 27.0* 26.4* 27.9*  MCV 95.2 96.3  --  98.5 98.2  PLT 244 211  --  197 203    Medications:     sodium chloride   Intravenous Once   budesonide (PULMICORT) nebulizer solution  0.5 mg Nebulization BID   Chlorhexidine Gluconate Cloth  6 each Topical Daily   docusate  100 mg Per Tube BID   feeding supplement (PROSource TF20)   60 mL Per Tube TID   free water  300 mL Per Tube Q4H   furosemide  60 mg Intravenous BID   insulin aspart  0-20 Units Subcutaneous Q4H   insulin aspart  13 Units Subcutaneous Q4H   insulin detemir  40 Units Subcutaneous Q12H   ipratropium  0.5 mg Nebulization Q6H   levalbuterol  0.63 mg Nebulization Q6H   levothyroxine  200 mcg Per Tube QAC breakfast   metoCLOPramide (REGLAN) injection  5 mg Intravenous Q6H   midodrine  10 mg Per Tube Q8H   multivitamin  1 tablet Per Tube Daily   mouth rinse  15 mL Mouth Rinse Q2H   pantoprazole (PROTONIX) IV  40 mg Intravenous Q12H   polyethylene glycol  17 g Per Tube BID   sennosides  15 mL Per Tube BID    Elmarie Shiley, MD 05/27/2022, 7:35 AM

## 2022-05-27 NOTE — Progress Notes (Addendum)
NAME:  Frank Beckner., MRN:  [id number], DOB:  Feb 06, 1961, LOS: 82 ADMISSION DATE:  05/12/2022, CONSULTATION DATE:  05/17/22 REFERRING MD:  [place], CHIEF COMPLAINT:  vent dep resp failure   History of Present Illness:  80 yowm with h/o PE/DVT on long term xarleto but missed a few doses over Christmas and presented 12/28 with recurrent PE in setting of  DVT >L> R on Dopplers with RV/LV 1.2 no change from CT 2020-06-10 and echo ok but coded am 06/07/2023 and transferred to ICU where  given Altepase empirically and noted to have R groin hematoma at sight of R CVL during code with one arterial stick and hgb dropping therefafter with neg GI source to date but all anticoagulation held and PCCM service asked to see pending transfer to [facility] when bed available with worsening uop/increasing K and creat developing while still on pressors and full vent support.   Pertinent  Medical History  obesity, T2DM, HTN, gout, COPD, hyperlipidemia. He also has a history of previous DVT/PE on xarelto.   Significant Hospital Events: Including procedures, antibiotic start and stop dates in addition to other pertinent events   2023/06/07: Cardiac arrest while in BR. CPR  initiated. TNK administered during CPR, fem cvl  placed/ post arrest shock, high dose pressors. Failed attempt on right femoral and right IJ access. Time to ROSC estimated over 30 minutes. Hgb dropped 12 to 8.8  12/31 on 3 pressors and stress dose steroids. In DKA. Insulin gtt started. LMWH placed on hold.  MRSA PCR screen Jun 07, 2023 > neg  BC x 2 1/1  > one /two pos gpc + staph hominis. Unasyn started.  1/3 transferred to [facility] central venous catheter removed.  Blood cultures repeated.  Repeat echo: EF 50 to 55% no regional wall abnormality grade 1 diastolic dysfunction RV normal Renal function improving with Lasix.  Weaning pressors.  Adding Seroquel and clonazepam to assist with weaning 1/7 ETT exchange, bronch for ongoing hemoptysis 1/10 no hemoptysis; resuming  heparin today; off levo 1/11 starting to wake up a bit, nephro decr lasix and incr FWF   1/12 more mucus, having incr peaks, changing sedation   Interim History / Subjective:   Overnight was agitated, got restrained and then started on a midaz gtt Some bloody tinged secretions overnight  This was weaned to 2 when I saw him this morning, he was very tachycardic, HTN, and incr RR   Objective   Blood pressure 136/64, pulse (!) 113, temperature 98.1 F (36.7 C), temperature source Axillary, resp. rate (!) 36, height 6\' 4"  (1.93 m), weight (!) 161.8 kg, SpO2 98 %. CVP:  [0 mmHg-21 mmHg] 2 mmHg  Vent Mode: PRVC FiO2 (%):  [40 %] 40 % Set Rate:  [15 bmp] 15 bmp Vt Set:  [690 mL] 690 mL PEEP:  [5 cmH20] 5 cmH20 Plateau Pressure:  [17 cmH20-22 cmH20] 19 cmH20   Intake/Output Summary (Last 24 hours) at 05/27/2022 1037 Last data filed at 05/27/2022 1026 Gross per 24 hour  Intake 2777.34 ml  Output 5036 ml  Net -2258.66 ml   Filed Weights   05/25/22 0615 05/26/22 0500 05/27/22 0400  Weight: (!) 160.6 kg (!) 158.5 kg (!) 161.8 kg    Examination: General:   Critically ill obese M in some distress intubated lightly sedated  HEENT: NCAT pink mm, thick resp secretions, blood tinged  Neuro: Moving spontaneously, not following commands  CV:  tachycardic PULM:  distant breath sounds, incr RR, mechanically ventialted  GI:  obese soft  Extremities: BLE edema  Skin: c/d/w  Assessment & Plan:    Acute encephalopathy  -AKI uremia, septic shock, delirium, medications, cardiac arrest  -only very mild hyperammonemia, doubt significantly contributing  -did follow commands 1/11  Plan: -RASS goal 0, rep sx are making this harder to achieve  -adding precedex 1/12. PRN fent. Will dc the continuous midaz gtt.  -delirium precautions  PE/DVT PEA arrest -s/p IVC filter  Plan: -hep resumed 1/10 but remains undetectable even after increase. Query if he is heparin resistant? D/w PharmD, change to  bival  -supportive  care  Acute respiratory failure with hypoxia Hx COPD, OSA Hemoptysis - improved  Possible mucus plugging?  -some blood tinged secretions 1/12 -- ? Sxn trauma  Plan: -CXR  -add CPT -pending Cxr, might need a bronch  -Wean vent as able -aggressive pulm hygiene -pulmicort, atrovent, xopenex  -with recent hep gtt restart, watching for hemoptysis recurrence closely -if we can't make progress toward extubation in coming days, need to discuss GOC/?trach  Severe sepsis due to Serratia marcescens HCAP Enterobacter cloacae HCAP Staph hominis bacteremia  -improved shock  Plan: -decr midodrine, hopefully can dc this weekend  -continue meropenem x 7days -trend wbc/fever curve -no TEE (ID was curbsided re this)   AKI Hypernatremia - polyuric on lasix; post arrest ATN  Plan: -nephro following -cont lasix and FWF per nephro  Hx Afib  Plan: -hep gtt changing to Bival 1/12  -cont amio -K goal 4 mag goal 2  Anemia -- blood loss, critical illness R groin hematoma, iatrogenic r/t arterial puncture  Plan: -trend cbc -monitor for signs of bleeding with hep gtt   DM2 with hyperglycemia  Plan: -incr basal and EN coverage, cont rSSI    Hx gastroparesis (likely 2/2 DM)  Plan: -cont bowel regimen and reglan   Best Practice (right click and "Reselect all SmartList Selections" daily)   Diet/type: advance to goal DVT prophylaxis: Bival  GI prophylaxis: PPI Lines: Central line and Arterial Line Foley:  Yes, and it is still needed Code Status:  DNR Last date of multidisciplinary goals of care discussion: 1/12 patient's son was updated at bedside  CRITICAL CARE Performed by: Cristal Generous   Total critical care time: 39 minutes  Critical care time was exclusive of separately billable procedures and treating other patients. Critical care was necessary to treat or prevent imminent or life-threatening deterioration.  Critical care was time spent personally  by me on the following activities: development of treatment plan with patient and/or surrogate as well as nursing, discussions with consultants, evaluation of patient's response to treatment, examination of patient, obtaining history from patient or surrogate, ordering and performing treatments and interventions, ordering and review of laboratory studies, ordering and review of radiographic studies, pulse oximetry and re-evaluation of patient's condition.  Eliseo Gum MSN, AGACNP-BC [facility] for pager  05/27/2022, 12:24 PM

## 2022-05-27 NOTE — Progress Notes (Signed)
[facility] Progress Note Patient Name: Frank Shelton. DOB: 1961/04/26 MRN: [id number]   Date of Service  05/27/2022  HPI/Events of Note  Agitation - Nursing request for bilateral wrist restraints.   eICU Interventions  Will order bilateral soft wrist restraints X 9 hours.      Intervention Category Major Interventions: Delirium, psychosis, severe agitation - evaluation and management  Jaydn Moscato [place] 05/27/2022, 12:41 AM

## 2022-05-27 NOTE — Progress Notes (Signed)
ANTICOAGULATION CONSULT NOTE   Pharmacy Consult for heparin< bivalirudin Indication: DVT  Allergies  Allergen Reactions   Nabumetone Hives    Patient Measurements: Height: 6\' 4"  (193 cm) Weight: (!) 161.8 kg (356 lb 11.3 oz) IBW/kg (Calculated) : 86.8 HEPARIN DW (KG): 129.7   Vital Signs: Temp: 99.4 F (37.4 C) (01/12 1644) Temp Source: Axillary (01/12 1644) BP: 80/52 (01/12 1900) Pulse Rate: 93 (01/12 1900)  Labs: Recent Labs    05/26/22 0303 05/26/22 1353 05/26/22 2005 05/26/22 2343 05/26/22 2344 05/27/22 0904 05/27/22 1334 05/27/22 1447 05/27/22 1638 05/27/22 1820  HGB 8.2*  --   --  8.8*  --   --   --  8.6*  --   --   HCT 26.4*  --   --  27.9*  --   --   --  27.9*  --   --   PLT 197  --   --  203  --   --   --  212  --   --   APTT  --   --   --   --   --   --   --   --  50* 48*  HEPARINUNFRC  --  <0.10*  --   --  <0.10* <0.10*  --   --   --   --   CREATININE 6.74*  --  5.93* 5.71*  --   --  5.31*  --   --   --      Estimated Creatinine Clearance: 23.5 mL/min (A) (by C-G formula based on SCr of 5.31 mg/dL (H)).   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 He is s/p TNK on 12/30 followed by hemorrhagic shock from right groin hematoma.  On 1/5, heparin was started conservatively with no bolus which subsequently led to recurrent bleeding issues. DDAVP and TXA nebs given 1/8. Heparin has been resumed and  pharmacy dosing  -heparin level continues to be undetectable -hemoptysis noted this morning and RN has noted continued bleeding at the IV line -hg= 8.8 and stable  Possible heparin resistance and plans are to change to bivalirudin  PTT came back 50 but it was drawn a little early. Four hour level came back at 48. We will increase slightly and check another 4 hr level.  Goal of Therapy:  aPTT= 50-85 Monitor platelets by anticoagulation protocol: Yes   Plan:  -Increase bivalirudin to 0.06mg /kg/hr -aPTT in 4 hrs  Onnie Boer, PharmD, [place], AAHIVP, CPP Infectious Disease Pharmacist 05/27/2022 7:20 PM

## 2022-05-28 DIAGNOSIS — I2609 Other pulmonary embolism with acute cor pulmonale: Secondary | ICD-10-CM | POA: Diagnosis not present

## 2022-05-28 LAB — BASIC METABOLIC PANEL
Anion gap: 13 (ref 5–15)
BUN: 125 mg/dL — ABNORMAL HIGH (ref 8–23)
CO2: 29 mmol/L (ref 22–32)
Calcium: 7.4 mg/dL — ABNORMAL LOW (ref 8.9–10.3)
Chloride: 105 mmol/L (ref 98–111)
Creatinine, Ser: 4.94 mg/dL — ABNORMAL HIGH (ref 0.61–1.24)
GFR, Estimated: 12 mL/min — ABNORMAL LOW (ref >60)
Glucose, Bld: 300 mg/dL — ABNORMAL HIGH (ref 70–99)
Potassium: 3.9 mmol/L (ref 3.5–5.1)
Sodium: 147 mmol/L — ABNORMAL HIGH (ref 135–145)

## 2022-05-28 LAB — GLUCOSE, CAPILLARY
Glucose-Capillary: 283 mg/dL — ABNORMAL HIGH (ref 70–99)
Glucose-Capillary: 267 mg/dL — ABNORMAL HIGH (ref 70–99)
Glucose-Capillary: 265 mg/dL — ABNORMAL HIGH (ref 70–99)
Glucose-Capillary: 241 mg/dL — ABNORMAL HIGH (ref 70–99)
Glucose-Capillary: 224 mg/dL — ABNORMAL HIGH (ref 70–99)
Glucose-Capillary: 190 mg/dL — ABNORMAL HIGH (ref 70–99)

## 2022-05-28 LAB — CBC
HCT: 25.6 % — ABNORMAL LOW (ref 39.0–52.0)
Hemoglobin: 7.9 g/dL — ABNORMAL LOW (ref 13.0–17.0)
MCH: 30.4 pg (ref 26.0–34.0)
MCHC: 30.9 g/dL (ref 30.0–36.0)
MCV: 98.5 fL (ref 80.0–100.0)
Platelets: 183 K/uL (ref 150–400)
RBC: 2.6 MIL/uL — ABNORMAL LOW (ref 4.22–5.81)
RDW: 19.4 % — ABNORMAL HIGH (ref 11.5–15.5)
WBC: 13.7 K/uL — ABNORMAL HIGH (ref 4.0–10.5)
nRBC: 0 % (ref 0.0–0.2)

## 2022-05-28 LAB — APTT
aPTT: 64 s — ABNORMAL HIGH (ref 24–36)
aPTT: 67 s — ABNORMAL HIGH (ref 24–36)

## 2022-05-28 MED ORDER — ORAL CARE MOUTH RINSE
15.0000 mL | OROMUCOSAL | Status: DC
  Administered 2022-05-28 – 2022-06-14 (×65): 15 mL via OROMUCOSAL

## 2022-05-28 MED ORDER — INSULIN DETEMIR 100 UNIT/ML ~~LOC~~ SOLN
55.0000 [IU] | Freq: Two times a day (BID) | SUBCUTANEOUS | Status: DC
  Administered 2022-05-28: 55 [IU] via SUBCUTANEOUS
  Filled 2022-05-28 (×3): qty 0.55

## 2022-05-28 MED ORDER — CHROMIUM PICOLINATE 200 MCG PO TABS
600.0000 ug | ORAL_TABLET | Freq: Every day | ORAL | Status: AC
  Administered 2022-05-29 – 2022-06-01 (×4): 600 ug
  Filled 2022-05-28 (×4): qty 3

## 2022-05-28 MED ORDER — ORAL CARE MOUTH RINSE: 15.0000 mL | OROMUCOSAL | Status: DC | PRN

## 2022-05-28 MED ORDER — CHROMIUM PICOLINATE 200 MCG PO TABS
600.0000 ug | ORAL_TABLET | Freq: Every day | ORAL | Status: DC
  Administered 2022-05-28: 600 ug via ORAL
  Filled 2022-05-28: qty 3

## 2022-05-28 MED ORDER — AMIODARONE HCL 200 MG PO TABS
200.0000 mg | ORAL_TABLET | Freq: Every day | ORAL | Status: DC
  Administered 2022-05-28 – 2022-06-02 (×6): 200 mg
  Filled 2022-05-28 (×6): qty 1

## 2022-05-28 NOTE — Procedures (Signed)
Extubation Procedure Note  Patient Details:   Name: Frank Shelton. DOB: 10/07/1960 MRN: [id number]   Airway Documentation:    Vent end date: 05/28/22 Vent end time: 1235   Evaluation  O2 sats: stable throughout Complications: No apparent complications Patient did tolerate procedure well. Bilateral Breath Sounds: Diminished   Yes, pt able to cough to clear secretions and hoarsely vocalize name. Pt is on 5L humidified nasal cannula   Virgilio Frees 05/28/2022, 12:38 PM

## 2022-05-28 NOTE — Evaluation (Addendum)
Clinical/Bedside Swallow Evaluation Patient Details  Name: Frank Shelton. MRN: [id number] Date of Birth: 08-Mar-1961  Today's Date: 05/28/2022 Time: SLP Start Time (ACUTE ONLY): 75 SLP Stop Time (ACUTE ONLY): 8366 SLP Time Calculation (min) (ACUTE ONLY): 20 min  Past Medical History:  Past Medical History:  Diagnosis Date   Arthritis    L shoulder & fingers    Bronchitis    during enviromental changes    Complication of anesthesia    Report of airway problem   Diabetes mellitus without complication (HCC)    Dyspnea    Embolism - blood clot    Gout    Headache    uses ibuprofen & tylenol & its relieved    Hypertension    Hypothyroidism    Myocardial infarction ([place]) 2012   OSA on CPAP    Peripheral vascular disease ([place])    Pulmonary embolism ([place])    Stroke ([place]) 2014   Past Surgical History:  Past Surgical History:  Procedure Laterality Date   HELLER MYOTOMY  2014   [place]   IVC FILTER INSERTION  2010   ROTATOR CUFF REPAIR Right    SKIN GRAFT     x3- to face., child    THYROIDECTOMY N/A 03/09/2017   Procedure: TOTAL THYROIDECTOMY;  Surgeon: Armandina Gemma, MD;  Location: [place];  Service: General;  Laterality: N/A;   TOTAL THYROIDECTOMY  03/09/2017   HPI:  Pt is a 62 y.o male with 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  Pt was intubated from 12/30-1/13.  CXR reported small right pleural effusion with probable adjacent right basilar  Atelectasis.    Assessment / Plan / Recommendation  Clinical Impression  Pt presents with suspected reversible, moderately-severe oropharyngeal dysphagia in the setting of recent extubation.  He was noted to have dysphonia with breathy/hoarse vocal quality upon SLP arrival.  Pt was lethargic, but cooperative and highly motivated to try ice chips and water.   Family was at bedside and reported that he consumes regular solids and thin liquids at baseline.  Pt with suspected prolonged AP transit and delayed swallow initiation with all PO trials.  Pt exhibited an immediate, strong, and prolonged cough with thin liquid and intermittent delayed/immediate throat clearing with ice chips.  Recommend continuation of NPO at this time with frequent oral care and temporary alternative means of nutrition.  SLP will f/u to determine readiness for clinical diet initiation vs instrumental swallow study.  SLP Visit Diagnosis: Dysphagia, oropharyngeal phase (R13.12)    Aspiration Risk  Moderate aspiration risk;Severe aspiration risk    Diet Recommendation NPO   Medication Administration: Via alternative means    Other  Recommendations Oral Care Recommendations: Oral care QID;Staff/trained caregiver to provide oral care    Recommendations for follow up therapy are one component of a multi-disciplinary discharge planning process, led by the attending physician.  Recommendations may be updated based on patient status, additional functional criteria and insurance authorization.  Follow up Recommendations Skilled nursing-short term rehab (<3 hours/day)      Assistance Recommended at Discharge    Functional Status Assessment Patient has had a recent decline in their functional status and demonstrates the ability to make significant improvements in function in a reasonable and predictable amount of time.  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  Pt was intubated from 12/30-1/13.  CXR reported small right pleural effusion with  probable adjacent right basilar  Atelectasis. Type of Study: Bedside Swallow Evaluation Previous Swallow Assessment: N/A Diet Prior to this Study: NPO Temperature Spikes Noted: Yes Respiratory Status: Nasal cannula History of Recent Intubation: Yes Length of Intubations (days): 15 days Date extubated: 05/28/22 Behavior/Cognition: Cooperative Oral Cavity Assessment: Within Functional Limits Oral Care Completed by SLP: No Oral Cavity - Dentition: Adequate natural dentition Vision: Functional for self-feeding Self-Feeding Abilities: Needs assist Patient Positioning: Upright in bed Baseline Vocal Quality: Low vocal intensity;Breathy Volitional Cough: Strong Volitional Swallow: Able to elicit    Oral/Motor/Sensory Function Overall Oral Motor/Sensory Function: Generalized oral weakness   Ice Chips Ice chips: Impaired Presentation: Spoon Oral Phase Functional Implications: Prolonged oral transit Pharyngeal Phase Impairments: Suspected delayed Swallow;Cough - Delayed;Throat Clearing - Immediate   Thin Liquid Thin Liquid: Impaired Presentation: Straw Pharyngeal  Phase Impairments: Suspected delayed Swallow;Cough - Immediate    Nectar Thick Nectar Thick Liquid: Not tested   Honey Thick Honey Thick Liquid: Not tested   Puree Puree: Not tested   Solid     Solid: Not tested     Bretta Bang, M.S., [place] Office: [phone]  Frank Shelton 05/28/2022,4:11 PM

## 2022-05-28 NOTE — Progress Notes (Signed)
ANTICOAGULATION CONSULT NOTE   Pharmacy Consult for heparin>>>bivalirudin Indication: DVT  Allergies  Allergen Reactions   Nabumetone Hives    Patient Measurements: Height: 6\' 4"  (193 cm) Weight: (!) 162 kg (357 lb 2.3 oz) IBW/kg (Calculated) : 86.8 HEPARIN DW (KG): 129.7   Vital Signs: Temp: 100.5 F (38.1 C) (01/13 0745) Temp Source: Oral (01/13 0745) BP: 149/69 (01/13 1100) Pulse Rate: 80 (01/13 1100)  Labs: Recent Labs    05/26/22 1353 05/26/22 2005 05/26/22 2343 05/26/22 2344 05/27/22 0904 05/27/22 1334 05/27/22 1447 05/27/22 1638 05/27/22 1820 05/27/22 2343 05/28/22 0414 05/28/22 0456  HGB  --   --  8.8*  --   --   --  8.6*  --   --   --  7.9*  --   HCT  --   --  27.9*  --   --   --  27.9*  --   --   --  25.6*  --   PLT  --   --  203  --   --   --  212  --   --   --  183  --   APTT  --   --   --   --   --   --   --    < > 48* 64*  --  67*  HEPARINUNFRC <0.10*  --   --  <0.10* <0.10*  --   --   --   --   --   --   --   CREATININE  --    < > 5.71*  --   --  5.31*  --   --   --   --  4.94*  --    < > = values in this interval not displayed.     Estimated Creatinine Clearance: 25.3 mL/min (A) (by C-G formula based on SCr of 4.94 mg/dL (H)).   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 He is s/p TNK on 12/30 followed by hemorrhagic shock from right groin hematoma.  On 1/5, heparin was started  conservatively with no bolus which subsequently led to recurrent bleeding issues. DDAVP and TXA nebs given 1/8. Heparin has been resumed and pharmacy dosing, now transitioned to bivalirudin with undetectable heparin levels.  Confirmatory aPTT is therapeutic at 67 seconds, CBC stable. No bleeding, hematoma is stable.  Goal of Therapy:  aPTT= 50-85 Monitor platelets by anticoagulation protocol: Yes   Plan:  -Cont bivalirudin at 0.06 mg/kg/hr -Daily aPTT, CBC  Arrie Senate, PharmD, BCPS, [facility] Clinical Pharmacist [phone] Please check AMION for all [facility] Pharm[facility] numbers 05/28/2022

## 2022-05-28 NOTE — Progress Notes (Signed)
[facility] Progress Note Patient Name: Frank Shelton. DOB: October 15, 1960 MRN: [id number]   Date of Service  05/28/2022  HPI/Events of Note  Patient stated to nurse that if his heart stopped or he was unable to breath he would want CPR, defibrillation, and intubation.  I verified this with the patient.  eICU Interventions  Code status changed to full code     Intervention Category Major Interventions: End of life / care limitation discussion  Mauri Brooklyn, P 05/28/2022, 10:00 PM

## 2022-05-28 NOTE — Progress Notes (Signed)
ANTICOAGULATION CONSULT NOTE   Pharmacy Consult for heparin>>>bivalirudin Indication: DVT  Allergies  Allergen Reactions   Nabumetone Hives    Patient Measurements: Height: 6\' 4"  (193 cm) Weight: (!) 161.8 kg (356 lb 11.3 oz) IBW/kg (Calculated) : 86.8 HEPARIN DW (KG): 129.7   Vital Signs: Temp: 99.5 F (37.5 C) (01/13 0000) Temp Source: Axillary (01/13 0000) BP: 130/65 (01/13 0000) Pulse Rate: 84 (01/13 0000)  Labs: Recent Labs    05/26/22 0303 05/26/22 1353 05/26/22 2005 05/26/22 2343 05/26/22 2344 05/27/22 0904 05/27/22 1334 05/27/22 1447 05/27/22 1638 05/27/22 1820 05/27/22 2343  HGB 8.2*  --   --  8.8*  --   --   --  8.6*  --   --   --   HCT 26.4*  --   --  27.9*  --   --   --  27.9*  --   --   --   PLT 197  --   --  203  --   --   --  212  --   --   --   APTT  --   --   --   --   --   --   --   --  50* 48* 64*  HEPARINUNFRC  --  <0.10*  --   --  <0.10* <0.10*  --   --   --   --   --   CREATININE 6.74*  --  5.93* 5.71*  --   --  5.31*  --   --   --   --      Estimated Creatinine Clearance: 23.5 mL/min (A) (by C-G formula based on SCr of 5.31 mg/dL (H)).   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 He is s/p TNK on 12/30 followed by hemorrhagic shock from right groin hematoma.  On 1/5, heparin was started conservatively with no bolus which subsequently led to recurrent bleeding issues. DDAVP and  TXA nebs given 1/8. Heparin has been resumed and pharmacy dosing  -heparin level continues to be undetectable -hemoptysis noted this morning and RN has noted continued bleeding at the IV line -hg= 8.8 and stable  Possible heparin resistance and plans are to change to bivalirudin  1/13 AM update:  aPTT therapeutic x 1   Goal of Therapy:  aPTT= 50-85 Monitor platelets by anticoagulation protocol: Yes   Plan:  -Cont bivalirudin at 0.06 mg/kg/hr -Confirmatory aPTT in 4 hrs with AM labs  Narda Bonds, PharmD, [place] Pharmacist Phone: [phone]

## 2022-05-28 NOTE — Progress Notes (Signed)
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 but missed a few doses over Christmas and presented 12/28 with recurrent PE in setting of  DVT >L> R on Dopplers with RV/LV 1.2 no change from CT 06-09-2020 and echo ok but coded am 06/06/2023 and transferred to ICU where  given Altepase empirically and noted to have R groin hematoma at sight of R CVL during code with one arterial stick and hgb dropping therefafter with neg GI source to date but all anticoagulation held and PCCM service asked to see pending transfer to [facility] when bed available with worsening uop/increasing K and creat developing while still on pressors and full vent support.   Pertinent  Medical History  obesity, T2DM, HTN, gout, COPD, hyperlipidemia. He also has a history of previous DVT/PE on xarelto.   Significant Hospital Events: Including procedures, antibiotic start and stop dates in addition to other pertinent events   06-06-2023: Cardiac arrest while in BR. CPR  initiated. TNK administered during CPR, fem cvl  placed/ post arrest shock, high dose pressors. Failed attempt on right femoral and right IJ access. Time to ROSC estimated over 30 minutes. Hgb dropped 12 to 8.8  12/31 on 3 pressors and stress dose steroids. In DKA. Insulin gtt started. LMWH placed on hold.  MRSA PCR screen 06-Jun-2023 > neg  BC x 2 1/1  > one /two pos gpc + staph hominis. Unasyn started.  1/3 transferred to [facility] central venous catheter removed.  Blood cultures repeated.  Repeat echo: EF 50 to 55% no regional wall abnormality grade 1 diastolic dysfunction RV normal Renal function improving with Lasix.  Weaning pressors.  Adding Seroquel and clonazepam to assist with weaning 1/7 ETT exchange, bronch for ongoing hemoptysis 1/10 no hemoptysis; resuming  heparin today; off levo 1/11 starting to wake up a bit, nephro decr lasix and incr FWF   1/12 more mucus, having incr peaks, changing sedation  1/13 finally awake following commands off vasopressors passed SBT.  Trial of extubation.  Interim History / Subjective:  With sedation off he has passed a prolonged SBT and is following commands.  Objective   Blood pressure (!) 164/101, pulse 83, temperature 98.8 F (37.1 C), temperature source Axillary, resp. rate (!) 31, height 6\' 4"  (1.93 m), weight (!) 162 kg, SpO2 99 %. CVP:  [3 mmHg-11 mmHg] 5 mmHg  Vent Mode: PSV;CPAP FiO2 (%):  [40 %] 40 % Set Rate:  [15 bmp] 15 bmp Vt Set:  [690 mL] 690 mL PEEP:  [5 cmH20] 5 cmH20 Pressure Support:  [8 cmH20] 8 cmH20 Plateau Pressure:  [17 cmH20-21 cmH20] 17 cmH20   Intake/Output Summary (Last 24 hours) at 05/28/2022 1228 Last data filed at 05/28/2022 1130 Gross per 24 hour  Intake 3835.75 ml  Output 5272 ml  Net -1436.25 ml    Filed Weights   05/26/22 0500 05/27/22 0400 05/28/22 0330  Weight: (!) 158.5 kg (!) 161.8 kg (!) 162 kg   Examination: General:   Critically ill obese man. HEENT: Mucous membranes are moist.  OG tube ET tube in place. Neuro: Moving spontaneously, following commands.  Able to lift head off of the pillow. CV: Heart sounds are unremarkable. PULM: Chest clear to auscultation.  Has tolerated SBT. GI: obese soft  Extremities: BLE edema  Skin: c/d/w  Assessment & Plan:   Acute multifactorial encephalopathy now improving following AKI uremia and septic shock -Extubate today -Keep off all sedatives. -Nighttime BiPAP to avoid hypercarbia.  PE/DVT PEA arrest -s/p IVC filter  -Continue bivalirudin. -Once cleared to swallow can transition to oral anticoagulant.  Acute respiratory failure with hypoxia Hx COPD, OSA Hemoptysis - improved  Possible mucus plugging?  -Extubate today -Nocturnal BiPAP -Continue current bronchodilators.  Septic shock due to Serratia  marcescens HCAP Enterobacter cloacae HCAP Staph hominis bacteremia now resolved -Begin to wean midodrine. -Completing course of meropenem. -Off vasopressors.  AKI with post ATN diuresis. Hypernatremia -nephro following -cont lasix and FWF per nephro  Hx Afib  -hep gtt changing to Bival 1/12  -cont amio p.o. -K goal 4 mag goal 2  Anemia -- blood loss, critical illness R groin hematoma, iatrogenic r/t arterial puncture  -trend cbc -monitor for signs of bleeding with hep gtt   DM2 with hyperglycemia  -incr basal and EN coverage, cont rSSI   -Chromium supplementation for insulin resistance. -Would benefit from bariatric surgery in the long run. -Consider SGLT2 if kidney function recovers.  Hx gastroparesis (likely 2/2 DM)  Plan: -cont bowel regimen and reglan  Best Practice (right click and "Reselect all SmartList Selections" daily)   Diet/type: advance to goal DVT prophylaxis: Bival  GI prophylaxis: PPI Lines: Central line and Arterial Line-will be discontinued. Foley:  Yes, and it is still needed Code Status:  DNR Last date of multidisciplinary goals of care discussion: 1/12 patient's son was updated at bedside  CRITICAL CARE Performed by: Kipp Brood   Total critical care time: 40 minutes  Critical care time was exclusive of separately billable procedures and treating other patients.  Critical care was necessary to treat or prevent imminent or life-threatening deterioration.  Critical care was time spent personally by me on the following activities: development of treatment plan with patient and/or surrogate as well as nursing, discussions with consultants, evaluation of patient's response to treatment, examination of patient, obtaining history from patient or surrogate, ordering and performing treatments and interventions, ordering and review of laboratory studies, ordering and review of radiographic studies, pulse oximetry, re-evaluation of patient's condition  and participation in multidisciplinary rounds.  Kipp Brood, MD [facility] ICU Physician [place]  Pager: [phone] Mobile: [phone] After hours: [phone].   05/28/2022, 12:44 PM     05/28/2022, 12:28 PM

## 2022-05-29 DIAGNOSIS — I2609 Other pulmonary embolism with acute cor pulmonale: Secondary | ICD-10-CM | POA: Diagnosis not present

## 2022-05-29 LAB — GLUCOSE, CAPILLARY
Glucose-Capillary: 177 mg/dL — ABNORMAL HIGH (ref 70–99)
Glucose-Capillary: 191 mg/dL — ABNORMAL HIGH (ref 70–99)
Glucose-Capillary: 197 mg/dL — ABNORMAL HIGH (ref 70–99)
Glucose-Capillary: 231 mg/dL — ABNORMAL HIGH (ref 70–99)
Glucose-Capillary: 301 mg/dL — ABNORMAL HIGH (ref 70–99)
Glucose-Capillary: 271 mg/dL — ABNORMAL HIGH (ref 70–99)

## 2022-05-29 LAB — CBC
HCT: 26.6 % — ABNORMAL LOW (ref 39.0–52.0)
Hemoglobin: 8.3 g/dL — ABNORMAL LOW (ref 13.0–17.0)
MCH: 31 pg (ref 26.0–34.0)
MCHC: 31.2 g/dL (ref 30.0–36.0)
MCV: 99.3 fL (ref 80.0–100.0)
Platelets: 197 10*3/uL (ref 150–400)
RBC: 2.68 MIL/uL — ABNORMAL LOW (ref 4.22–5.81)
RDW: 19.2 % — ABNORMAL HIGH (ref 11.5–15.5)
WBC: 13.2 10*3/uL — ABNORMAL HIGH (ref 4.0–10.5)
nRBC: 0.2 % (ref 0.0–0.2)

## 2022-05-29 LAB — BASIC METABOLIC PANEL
Anion gap: 11 (ref 5–15)
BUN: 118 mg/dL — ABNORMAL HIGH (ref 8–23)
CO2: 29 mmol/L (ref 22–32)
Calcium: 8 mg/dL — ABNORMAL LOW (ref 8.9–10.3)
Chloride: 109 mmol/L (ref 98–111)
Creatinine, Ser: 4.15 mg/dL — ABNORMAL HIGH (ref 0.61–1.24)
GFR, Estimated: 15 mL/min — ABNORMAL LOW (ref >60)
Glucose, Bld: 192 mg/dL — ABNORMAL HIGH (ref 70–99)
Potassium: 3.3 mmol/L — ABNORMAL LOW (ref 3.5–5.1)
Sodium: 149 mmol/L — ABNORMAL HIGH (ref 135–145)

## 2022-05-29 MED ORDER — SENNOSIDES 8.8 MG/5ML PO SYRP
15.0000 mL | ORAL_SOLUTION | Freq: Every evening | ORAL | Status: DC | PRN
  Administered 2022-05-29: 15 mL
  Filled 2022-05-29: qty 15

## 2022-05-29 MED ORDER — FUROSEMIDE 10 MG/ML IJ SOLN: 60.0000 mg | Freq: Every day | INTRAMUSCULAR | Status: DC

## 2022-05-29 MED ORDER — POTASSIUM CHLORIDE 20 MEQ PO PACK
40.0000 meq | PACK | Freq: Two times a day (BID) | ORAL | Status: DC
  Administered 2022-05-29 (×2): 40 meq via ORAL
  Filled 2022-05-29 (×2): qty 2

## 2022-05-29 MED ORDER — POTASSIUM CHLORIDE 20 MEQ PO PACK: 40.0000 meq | PACK | Freq: Once | ORAL | Status: DC

## 2022-05-29 MED ORDER — APIXABAN 5 MG PO TABS
5.0000 mg | ORAL_TABLET | Freq: Two times a day (BID) | ORAL | Status: DC
  Administered 2022-05-29 – 2022-06-09 (×23): 5 mg
  Filled 2022-05-29 (×23): qty 1

## 2022-05-29 MED ORDER — POTASSIUM CHLORIDE 10 MEQ/50ML IV SOLN: 10.0000 meq | INTRAVENOUS | Status: DC

## 2022-05-29 NOTE — Progress Notes (Signed)
Speech Language Pathology Treatment: Dysphagia  Patient Details Name: Frank Shelton. MRN: [id number] DOB: 05/16/1961 Today's Date: 05/29/2022 Time: 1010-1030 SLP Time Calculation (min) (ACUTE ONLY): 20 min  Assessment / Plan / Recommendation Clinical Impression  Patient seen by SLP for skilled treatment session focused on dysphagia goals. His eldest son was in the room during session. Patient was awake, alert but required cues to maintain attention and interactions, even with son and granddaughter who was on video chat, were limited. SLP performed oral care but patient's oral mucosa was clean, free of secretions and moist overall. H After oral care, he was receptive to trial of small ice chips, given one at a time. is voice was dysphonic, low in intensity and hoarse but with some improvement after a few ice chips. SLP did not observe any significant oral delay and patient able to masticate without difficulty. During pharyngeal phase of swallow, SLP suspects a mild swallow initiation delay but no overt s/s aspiration or penetration observed and no change in vitals. This is an improvement as compared to initial swallow evaluation previous date which reported patient coughing after ice chips. SLP recommending continue NPO status but allow PRN ice chips with full supervision.  Plan to determine patient readiness for objective swallow study (MBS).    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  Pt was intubated from 12/30-1/13.  CXR reported small right pleural effusion with probable adjacent right basilar  Atelectasis.      SLP Plan  Continue with current plan of care      Recommendations for follow up therapy are one component of a multi-disciplinary discharge planning process, led by the  attending physician.  Recommendations may be updated based on patient status, additional functional criteria and insurance authorization.    Recommendations  Diet recommendations: NPO;Other(comment) (PRN ice chips after oral care) Medication Administration: Via alternative means                Oral Care Recommendations: Oral care QID;Staff/trained caregiver to provide oral care;Oral care prior to ice chip/H20 Follow Up Recommendations: Skilled nursing-short term rehab (<3 hours/day) Assistance recommended at discharge: Frequent or constant Supervision/Assistance SLP Visit Diagnosis: Dysphagia, oropharyngeal phase (R13.12) Plan: Continue with current plan of care          Sonia Baller, MA, CCC-SLP Speech Therapy

## 2022-05-29 NOTE — Progress Notes (Signed)
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 but missed a few doses over Christmas and presented 12/28 with recurrent PE in setting of  DVT >L> R on Dopplers with RV/LV 1.2 no change from CT Jun 02, 2020 and echo ok but coded am May 29, 2023 and transferred to ICU where  given Altepase empirically and noted to have R groin hematoma at sight of R CVL during code with one arterial stick and hgb dropping therefafter with neg GI source to date but all anticoagulation held and PCCM service asked to see pending transfer to [facility] when bed available with worsening uop/increasing K and creat developing while still on pressors and full vent support.   Pertinent  Medical History  obesity, T2DM, HTN, gout, COPD, hyperlipidemia. He also has a history of previous DVT/PE on xarelto.   Significant Hospital Events: Including procedures, antibiotic start and stop dates in addition to other pertinent events   05/29/2023: Cardiac arrest while in BR. CPR  initiated. TNK administered during CPR, fem cvl  placed/ post arrest shock, high dose pressors. Failed attempt on right femoral and right IJ access. Time to ROSC estimated over 30 minutes. Hgb dropped 12 to 8.8  12/31 on 3 pressors and stress dose steroids. In DKA. Insulin gtt started. LMWH placed on hold.  MRSA PCR screen 05-29-2023 > neg  BC x 2 1/1  > one /two pos gpc + staph hominis. Unasyn started.  1/3 transferred to [facility] central venous catheter removed.  Blood cultures repeated.  Repeat echo: EF 50 to 55% no regional wall abnormality grade 1 diastolic dysfunction RV normal Renal function improving with Lasix.  Weaning pressors.  Adding Seroquel and clonazepam to assist with weaning 1/7 ETT exchange, bronch for ongoing hemoptysis 1/10 no hemoptysis; resuming  heparin today; off levo 1/11 starting to wake up a bit, nephro decr lasix and incr FWF   1/12 more mucus, having incr peaks, changing sedation  1/13 finally awake following commands off vasopressors passed SBT.  Trial of extubation.  Interim History / Subjective:  Did well following following extubation yesterday. Failed swallow screen.   Objective   Blood pressure (!) 150/77, pulse 93, temperature 98.5 F (36.9 C), temperature source Oral, resp. rate (!) 23, height 6\' 4"  (1.93 m), weight (!) 161.4 kg, SpO2 97 %. CVP:  [0 mmHg-8 mmHg] 0 mmHg  SpO2: 97 % O2 Flow Rate (L/min): 5 L/min FiO2 (%): 40 %   Intake/Output Summary (Last 24 hours) at 05/29/2022 0820 Last data filed at 05/29/2022 0800 Gross per 24 hour  Intake 2641.98 ml  Output 4504 ml  Net -1862.02 ml    Filed Weights   05/27/22 0400 05/28/22 0330 05/29/22 0400  Weight: (!) 161.8 kg (!) 162 kg (!) 161.4 kg   Examination: General:   Obese man, now extubated.  HEENT: Mucous membranes are moist.  Small bore tube in place. Voice still aphonic Neuro: Moving spontaneously, following commands. Still some inattention and limited insight into condition.  Diffusely weak with 2-3/5 strength proximal worse than distal.  CV: Heart sounds are unremarkable. PULM: Chest clear to auscultation. GI: obese soft  Extremities: BLE edema with venous stasis changes.  Skin: c/d/w  Ancillary tests personally reviewed:  Creatinine continues to improve slowly 4.15 Na improving to 149  HB: 8.3 Assessment & Plan:   Acute multifactorial encephalopathy now improving following AKI uremia and septic shock -Keep off all sedatives. -Nighttime BiPAP to avoid hypercarbia. -OT/SLP for neurocognitive rehabilitation.   PE/DVT PEA arrest -s/p IVC filter  -Transition to Eliquis  Acute respiratory failure with hypoxia Hx COPD, OSA Hemoptysis - improved  Possible mucus plugging?  -Nocturnal BiPAP -Continue current bronchodilators.  Septic shock  due to Serratia marcescens HCAP Enterobacter cloacae HCAP Staph hominis bacteremia now resolved -Stop midodrine as now hypertensive.  -Completing course of meropenem.  AKI with post ATN diuresis. Hypernatremia -nephro following -cont lasix and FWF per nephro  Hx Afib  -Well controlled on amiodarone po  -Switch to Eliquis  Anemia - blood loss, critical illness R groin hematoma, resolved.  DM2 with hyperglycemia  -incr basal and EN coverage, cont rSSI   -Chromium supplementation for insulin resistance. -Would benefit from bariatric surgery in the long run. -Consider SGLT2 if kidney function recovers.  Hx gastroparesis (likely 2/2 DM)  -cont bowel regimen and reglan  Disposition - Ready for PCU - TRH notified and orders reconciled.   Best Practice (right click and "Reselect all SmartList Selections" daily)   Diet/type: advance to goal DVT prophylaxis: Bival  GI prophylaxis: PPI Lines: Central line and Arterial Line-will be discontinued. Foley:  Yes, and it is still needed Code Status:  back to full code per patient discussion  Last date of multidisciplinary goals of care discussion: 1/12 patient's son was updated at bedside   Kipp Brood, Kenneth ICU Physician [place]  Pager: [phone] Mobile: [phone] After hours: [phone].   05/29/2022, 8:20 AM

## 2022-05-29 NOTE — Evaluation (Signed)
Physical Therapy Evaluation Patient Details Name: Frank Shelton. 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  Pt was intubated from 12/30-1/13.  CXR reported small right pleural effusion with probable adjacent right basilar Atelectasis. Significant PMH: PE/DVT, DM, HTN, gout, COPD, OSA.  Clinical Impression  PTA, pt lives with his family and is independent. Pt evaluated s/p extubation 1/13. Pt presents with gross weakness/debility, decreased ROM, impaired sitting balance, cognitive deficits and decreased activity tolerance. Placed bed in chair position to promote upright and worked on BUE/BLE therapeutic exercises (see below) for AAROM and strengthening. Pt will need intensive rehabilitation to make functional gains; will likely need +3 assist for OOB mobility. Positive prognostic factors include age, PLOF and family support.      Recommendations for follow up therapy are one component of a multi-disciplinary discharge planning process, led by the attending physician.  Recommendations may be updated based on patient status, additional functional criteria and insurance authorization.  Follow Up Recommendations Acute inpatient rehab (3hours/day)      Assistance Recommended at Discharge Frequent or constant Supervision/Assistance  Patient can return home with the following  Two people to help with walking and/or transfers;Two people to help with bathing/dressing/bathroom    Equipment Recommendations Other (comment) (TBA)  Recommendations for Other Services  Rehab consult;OT consult    Functional Status Assessment Patient has had a recent decline in their functional status and demonstrates the ability to make  significant improvements in function in a reasonable and predictable amount of time.     Precautions / Restrictions Precautions Precautions: Fall Restrictions Weight Bearing Restrictions: No      Mobility  Bed Mobility Overal bed mobility: Needs Assistance             General bed mobility comments: TotalA for repositioning    Transfers                   General transfer comment: deferred    Ambulation/Gait                  Stairs            Wheelchair Mobility    Modified Rankin (Stroke Patients Only)       Balance                                             Pertinent Vitals/Pain Pain Assessment Pain Assessment: Faces Pain Location: LLE with movement Pain Descriptors / Indicators: Discomfort Pain Intervention(s): Monitored during session    Home Living Family/patient expects to be discharged to:: Private residence Living Arrangements: Children (82 y.o. son (shares custody)) Available Help at Discharge: Family Type of Home: House Home Access: Stairs to enter Entrance Stairs-Rails: None Entrance Stairs-Number of Steps: 2   Home Layout: One level Home Equipment: Cane - single point      Prior Function Prior Level of Function : Independent/Modified Independent             Mobility Comments: does not work       Higher education careers adviser  Extremity/Trunk Assessment   Upper Extremity Assessment Upper Extremity Assessment: Defer to OT evaluation    Lower Extremity Assessment Lower Extremity Assessment: Generalized weakness    Cervical / Trunk Assessment Cervical / Trunk Assessment: Other exceptions Cervical / Trunk Exceptions: increased body habitus  Communication   Communication: Expressive difficulties  Cognition Arousal/Alertness: Awake/alert Behavior During Therapy: WFL for tasks assessed/performed Overall Cognitive Status: Impaired/Different from baseline Area of Impairment: Attention,  Memory, Following commands, Safety/judgement, Awareness, Problem solving                   Current Attention Level: Sustained Memory: Decreased short-term memory Following Commands: Follows one step commands inconsistently Safety/Judgement: Decreased awareness of safety, Decreased awareness of deficits Awareness: Emergent Problem Solving: Slow processing, Requires verbal cues General Comments: Pt interacting with family members, perseverating on receiving ice chips, able to be redirected to participate in therapy session.        General Comments      Exercises General Exercises - Upper Extremity Shoulder Flexion: AAROM, Both, 5 reps, Supine Shoulder Horizontal ADduction: AAROM, Both, 5 reps, Supine Elbow Flexion: AAROM, Both, 5 reps, Supine Elbow Extension: AAROM, Both, 5 reps, Supine Wrist Flexion: AAROM, Both, 5 reps, Supine Wrist Extension: AAROM, Both, 5 reps, Supine Digit Composite Flexion: AAROM, Both, 5 reps, Supine Composite Extension: AAROM, Both, 5 reps, Supine General Exercises - Lower Extremity Ankle Circles/Pumps: AROM, Both, 10 reps, Supine Quad Sets: Both, 5 reps, Supine (max cues) Heel Slides: AAROM, Both, 5 reps, Supine Hip ABduction/ADduction: AAROM, Both, 5 reps, Supine Bed in chair position: cervical rotation to R/L, left lateral cervical flexion stretch Bed in chair position: cervical rotation to R/L, left lateral cervical flexion stretch  Bed in chair position: sit ups x 3, totalA to bring shoulders off back of bed    Assessment/Plan    PT Assessment Patient needs continued PT services  PT Problem List Decreased strength;Decreased range of motion;Decreased activity tolerance;Decreased balance;Decreased mobility;Decreased cognition;Decreased safety awareness;Obesity       PT Treatment Interventions DME instruction;Gait training;Functional mobility training;Therapeutic activities;Balance training;Therapeutic exercise;Patient/family education    PT  Goals (Current goals can be found in the Care Plan section)  Acute Rehab PT Goals Patient Stated Goal: to go home PT Goal Formulation: With patient/family Time For Goal Achievement: 06/12/22 Potential to Achieve Goals: Fair    Frequency Min 3X/week     Co-evaluation               AM-PAC PT "6 Clicks" Mobility  Outcome Measure Help needed turning from your back to your side while in a flat bed without using bedrails?: Total Help needed moving from lying on your back to sitting on the side of a flat bed without using bedrails?: Total Help needed moving to and from a bed to a chair (including a wheelchair)?: Total Help needed standing up from a chair using your arms (e.g., wheelchair or bedside chair)?: Total Help needed to walk in hospital room?: Total Help needed climbing 3-5 steps with a railing? : Total 6 Click Score: 6    End of Session   Activity Tolerance: Patient tolerated treatment well Patient left: in bed;with call bell/phone within reach;with family/visitor present;Other (comment) (bed placed in chair position) Nurse Communication: Mobility status PT Visit Diagnosis: Other abnormalities of gait and mobility (R26.89);Muscle weakness (generalized) (M62.81)    Time: 5053-9767 PT Time Calculation (min) (ACUTE ONLY): 27 min   Charges:   PT Evaluation $PT Eval Moderate Complexity: 1 Mod PT Treatments $Therapeutic Activity:  8-22 mins        Wyona Almas, PT, DPT Acute Rehabilitation Services Office [place] 05/29/2022, 4:28 PM

## 2022-05-30 ENCOUNTER — Inpatient Hospital Stay (HOSPITAL_COMMUNITY): Payer: Medicare HMO

## 2022-05-30 ENCOUNTER — Other Ambulatory Visit (HOSPITAL_COMMUNITY): Payer: Self-pay

## 2022-05-30 DIAGNOSIS — I2609 Other pulmonary embolism with acute cor pulmonale: Secondary | ICD-10-CM | POA: Diagnosis not present

## 2022-05-30 LAB — CREATININE, URINE, RANDOM: Creatinine, Urine: 56 mg/dL

## 2022-05-30 LAB — GLUCOSE, CAPILLARY
Glucose-Capillary: 300 mg/dL — ABNORMAL HIGH (ref 70–99)
Glucose-Capillary: 305 mg/dL — ABNORMAL HIGH (ref 70–99)
Glucose-Capillary: 299 mg/dL — ABNORMAL HIGH (ref 70–99)
Glucose-Capillary: 308 mg/dL — ABNORMAL HIGH (ref 70–99)
Glucose-Capillary: 289 mg/dL — ABNORMAL HIGH (ref 70–99)
Glucose-Capillary: 343 mg/dL — ABNORMAL HIGH (ref 70–99)

## 2022-05-30 LAB — CBC
HCT: 30.2 % — ABNORMAL LOW (ref 39.0–52.0)
Hemoglobin: 9.2 g/dL — ABNORMAL LOW (ref 13.0–17.0)
MCH: 30.9 pg (ref 26.0–34.0)
MCHC: 30.5 g/dL (ref 30.0–36.0)
MCV: 101.3 fL — ABNORMAL HIGH (ref 80.0–100.0)
Platelets: 225 10*3/uL (ref 150–400)
RBC: 2.98 MIL/uL — ABNORMAL LOW (ref 4.22–5.81)
RDW: 19.6 % — ABNORMAL HIGH (ref 11.5–15.5)
WBC: 11.9 10*3/uL — ABNORMAL HIGH (ref 4.0–10.5)
nRBC: 0 % (ref 0.0–0.2)

## 2022-05-30 LAB — SODIUM, URINE, RANDOM: Sodium, Ur: 52 mmol/L

## 2022-05-30 LAB — BASIC METABOLIC PANEL
Anion gap: 12 (ref 5–15)
BUN: 109 mg/dL — ABNORMAL HIGH (ref 8–23)
CO2: 28 mmol/L (ref 22–32)
Calcium: 8.4 mg/dL — ABNORMAL LOW (ref 8.9–10.3)
Chloride: 114 mmol/L — ABNORMAL HIGH (ref 98–111)
Creatinine, Ser: 3.49 mg/dL — ABNORMAL HIGH (ref 0.61–1.24)
GFR, Estimated: 19 mL/min — ABNORMAL LOW (ref >60)
Glucose, Bld: 313 mg/dL — ABNORMAL HIGH (ref 70–99)
Potassium: 4.3 mmol/L (ref 3.5–5.1)
Sodium: 154 mmol/L — ABNORMAL HIGH (ref 135–145)

## 2022-05-30 LAB — C-REACTIVE PROTEIN: CRP: 2.6 mg/dL — ABNORMAL HIGH (ref <1.0)

## 2022-05-30 LAB — OSMOLALITY: Osmolality: 378 mOsm/kg (ref 275–295)

## 2022-05-30 LAB — PROCALCITONIN: Procalcitonin: 0.32 ng/mL

## 2022-05-30 LAB — BRAIN NATRIURETIC PEPTIDE: B Natriuretic Peptide: 64 pg/mL (ref 0.0–100.0)

## 2022-05-30 LAB — TSH: TSH: 55.162 u[IU]/mL — ABNORMAL HIGH (ref 0.350–4.500)

## 2022-05-30 LAB — URIC ACID: Uric Acid, Serum: 8.9 mg/dL — ABNORMAL HIGH (ref 3.7–8.6)

## 2022-05-30 MED ORDER — INSULIN GLARGINE-YFGN 100 UNIT/ML ~~LOC~~ SOLN
20.0000 [IU] | Freq: Every day | SUBCUTANEOUS | Status: DC
  Filled 2022-05-30: qty 0.2

## 2022-05-30 MED ORDER — LEVALBUTEROL HCL 0.63 MG/3ML IN NEBU
0.6300 mg | INHALATION_SOLUTION | Freq: Two times a day (BID) | RESPIRATORY_TRACT | Status: DC
  Administered 2022-05-30 – 2022-06-07 (×16): 0.63 mg via RESPIRATORY_TRACT
  Filled 2022-05-30 (×16): qty 3

## 2022-05-30 MED ORDER — INSULIN GLARGINE-YFGN 100 UNIT/ML ~~LOC~~ SOLN
20.0000 [IU] | Freq: Every day | SUBCUTANEOUS | Status: DC
  Administered 2022-05-30 – 2022-05-31 (×2): 20 [IU] via SUBCUTANEOUS
  Filled 2022-05-30 (×2): qty 0.2

## 2022-05-30 MED ORDER — DEXTROSE 5 % IV SOLN: INTRAVENOUS | Status: DC

## 2022-05-30 MED ORDER — DEXTROSE 5 % IV SOLN: INTRAVENOUS | Status: AC

## 2022-05-30 MED ORDER — IPRATROPIUM BROMIDE 0.02 % IN SOLN
0.5000 mg | Freq: Two times a day (BID) | RESPIRATORY_TRACT | Status: DC
  Administered 2022-05-30 – 2022-06-07 (×16): 0.5 mg via RESPIRATORY_TRACT
  Filled 2022-05-30 (×16): qty 2.5

## 2022-05-30 NOTE — Progress Notes (Signed)
Physical Therapy Treatment Patient Details Name: Frank Shelton. 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  Pt was intubated from 12/30-1/13.  CXR reported small right pleural effusion with probable adjacent right basilar Atelectasis. Significant PMH: PE/DVT, DM, HTN, gout, COPD, OSA.    PT Comments    Emphasis today on bed mobility, transferring out of bed to chair via maxi sky and core/truncal activation. Pt requiring maxA (+3) for bed mobility. Positioned in neutral in chair to decrease R lateral lean. Pt will need intensive rehabilitation to make functional gains.     Recommendations for follow up therapy are one component of a multi-disciplinary discharge planning process, led by the attending physician.  Recommendations may be updated based on patient status, additional functional criteria and insurance authorization.  Follow Up Recommendations  Acute inpatient rehab (3hours/day)     Assistance Recommended at Discharge Frequent or constant Supervision/Assistance  Patient can return home with the following Two people to help with walking and/or transfers;Two people to help with bathing/dressing/bathroom   Equipment Recommendations  Other (comment) (TBA)    Recommendations for Other Services       Precautions / Restrictions Precautions Precautions: Fall Precaution Comments: cortrak Restrictions Weight Bearing Restrictions: No     Mobility  Bed Mobility Overal bed mobility: Needs Assistance Bed Mobility: Rolling Rolling: Max assist (+3)         General bed mobility comments: MaxA + 3 for rolling to R/L, max multimodal cueing for initiation    Transfers Overall transfer level: Needs  assistance Equipment used: Ambulation equipment used Transfers: Bed to chair/wheelchair/BSC             General transfer comment: Transferred out of bed via maxi move    Ambulation/Gait                   Stairs             Wheelchair Mobility    Modified Rankin (Stroke Patients Only)       Balance                                            Cognition Arousal/Alertness: Awake/alert Behavior During Therapy: Flat affect Overall Cognitive Status: Impaired/Different from baseline Area of Impairment: Attention, Memory, Following commands, Safety/judgement, Awareness, Problem solving                   Current Attention Level: Sustained Memory: Decreased short-term memory Following Commands: Follows one step commands inconsistently, Follows one step commands with increased time Safety/Judgement: Decreased awareness of safety, Decreased awareness of deficits Awareness: Emergent Problem Solving: Slow processing, Requires verbal cues General Comments: Able to verbalize some needs i.e. to have a bowel movement, and to "be done with therapy." Follows 1 step commands with multimodal cues and increased time        Exercises Other Exercises Other Exercises: Supine: left cervical rotation and lateral flexion stretch Other Exercises: Sitting in chair: pulling forward x 2 for extended period with emphasis on cervical extension, rotation, and then truncal rotation  to R/L    General Comments        Pertinent Vitals/Pain Pain Assessment Pain Assessment: Faces Faces Pain Scale: Hurts a little bit Pain Location: LLE with movement Pain Descriptors / Indicators: Discomfort Pain Intervention(s): Limited activity within patient's tolerance, Monitored during session    Home Living Family/patient expects to be discharged to:: Private residence Living Arrangements: Children Available Help at Discharge: Family Type of Home: House Home Access:  Stairs to enter Entrance Stairs-Rails: None Technical brewer of Steps: 2   Home Layout: One level Home Equipment: Cane - single point      Prior Function            PT Goals (current goals can now be found in the care plan section) Acute Rehab PT Goals Patient Stated Goal: to go home Potential to Achieve Goals: Fair    Frequency    Min 3X/week      PT Plan Current plan remains appropriate    Co-evaluation PT/OT/SLP Co-Evaluation/Treatment: Yes Reason for Co-Treatment: Complexity of the patient's impairments (multi-system involvement);For patient/therapist safety;To address functional/ADL transfers PT goals addressed during session: Mobility/safety with mobility OT goals addressed during session: ADL's and self-care      AM-PAC PT "6 Clicks" Mobility   Outcome Measure  Help needed turning from your back to your side while in a flat bed without using bedrails?: Total Help needed moving from lying on your back to sitting on the side of a flat bed without using bedrails?: Total Help needed moving to and from a bed to a chair (including a wheelchair)?: Total Help needed standing up from a chair using your arms (e.g., wheelchair or bedside chair)?: Total Help needed to walk in hospital room?: Total Help needed climbing 3-5 steps with a railing? : Total 6 Click Score: 6    End of Session Equipment Utilized During Treatment: Oxygen Activity Tolerance: Patient tolerated treatment well Patient left: in chair;with call bell/phone within reach Nurse Communication: Mobility status;Need for lift equipment PT Visit Diagnosis: Other abnormalities of gait and mobility (R26.89);Muscle weakness (generalized) (M62.81)     Time: 8101-7510 PT Time Calculation (min) (ACUTE ONLY): 33 min  Charges:  $Therapeutic Activity: 8-22 mins                     Wyona Almas, PT, DPT Acute Rehabilitation Services Office [phone]    Deno Etienne 05/30/2022, 1:29 PM

## 2022-05-30 NOTE — Discharge Instructions (Signed)
Information on my medicine - ELIQUIS (apixaban)  Why was Eliquis prescribed for you? Eliquis was prescribed to treat blood clots that may have been found in the veins of your legs (deep vein thrombosis) or in your lungs (pulmonary embolism) and to reduce the risk of them occurring again.  What do You need to know about Eliquis ? The  dose  ONE 5 mg tablet taken TWICE daily.  Eliquis may be taken with or without food.   Try to take the dose about the same time in the morning and in the evening. If you have difficulty swallowing the tablet whole please discuss with your pharmacist how to take the medication safely.  Take Eliquis exactly as prescribed and DO NOT stop taking Eliquis without talking to the doctor who prescribed the medication.  Stopping may increase your risk of developing a new blood clot.  Refill your prescription before you run out.  After discharge, you should have regular check-up appointments with your healthcare provider that is prescribing your Eliquis.    What do you do if you miss a dose? If a dose of ELIQUIS is not taken at the scheduled time, take it as soon as possible on the same day and twice-daily administration should be resumed. The dose should not be doubled to make up for a missed dose.  Important Safety Information A possible side effect of Eliquis is bleeding. You should call your healthcare provider right away if you experience any of the following: ? Bleeding from an injury or your nose that does not stop. ? Unusual colored urine (red or dark brown) or unusual colored stools (red or black). ? Unusual bruising for unknown reasons. ? A serious fall or if you hit your head (even if there is no bleeding).  Some medicines may interact with Eliquis and might increase your risk of bleeding or clotting while on Eliquis. To help avoid this, consult your healthcare provider or pharmacist prior to using any new prescription or non-prescription medications,  including herbals, vitamins, non-steroidal anti-inflammatory drugs (NSAIDs) and supplements.  This website has more information on Eliquis (apixaban): http://www.eliquis.com/eliquis/home   

## 2022-05-30 NOTE — TOC Benefit Eligibility Note (Signed)
Patient Advocate Encounter  Insurance verification completed.    The patient is currently admitted and upon discharge could be taking Eliquis 5 mg.  The current 30 day co-pay is $11.20.   The patient is currently admitted and upon discharge could be taking Xarelto 20 mg.  The current 30 day co-pay is $11.20.   The patient is insured through [employer] Part D   Tausha Milhoan, CPHT Pharmacy Patient Advocate Specialist [facility] Patient Advocate Team Direct Number: [phone]  Fax: [phone]       

## 2022-05-30 NOTE — Progress Notes (Signed)
Inpatient Diabetes Program Recommendations  AACE/ADA: New Consensus Statement on Inpatient Glycemic Control (2015)  Target Ranges:  Prepandial:   less than 140 mg/dL      Peak postprandial:   less than 180 mg/dL (1-2 hours)      Critically ill patients:  140 - 180 mg/dL   Lab Results  Component Value Date   GLUCAP 299 (H) 05/30/2022   HGBA1C 11.4 (H) 05/12/2022    Review of Glycemic Control  Latest Reference Range & Units 05/30/22 04:04 05/30/22 07:39 05/30/22 11:46  Glucose-Capillary 70 - 99 mg/dL 300 (H) 305 (H) 299 (H)   Diabetes history: DM 2 Outpatient Diabetes medications:  None listed Current orders for Inpatient glycemic control:  Semglee 20 units daily Novolog 0-20 units q 4 hours Vital at 60 ml/hr  Inpatient Diabetes Program Recommendations:    Note patient was on Levemir 55 units bid on 05/28/22 plus tube feed coverage.  Both were d/c'd yesterday.   Note Semglee restarted today.  Patient received 71 units of Novolog on 05/29/22.   Consider increasing Semglee to 20 units bid and add Novolog tube feed coverage 5 units q 4 hours.  Will follow.   Thanks,  Adah Perl, RN, BC-ADM Inpatient Diabetes Coordinator Pager [phone]   (8a-5p)

## 2022-05-30 NOTE — Progress Notes (Signed)
PROGRESS NOTE                                                                                                                                                                                                             Patient Demographics:    Frank Shelton, is a 62 y.o. 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  He had some bleeding from his groin at the site of arterial stick in ICU, he also went into AKI.  He was seen by [facility] and nephrology, he was intubated for several days finally extubated on 05/28/2022, he also developed pneumonia for which she finished on antibiotics on 05/29/2022.  He is currently diffusely weak, has NG tube for feeding. He was transferred to hospitalist service on 05/30/2022, he is still diffusely weak, bedbound with NG tube feed dependence.  Still has severe hypernatremia with improving renal function.   Significant Hospital Events: Including procedures, antibiotic start and stop dates in addition to other pertinent events   06-14-2023: Cardiac arrest while in BR. CPR  initiated. TNK administered during CPR, fem cvl  placed/ post arrest shock, high dose pressors. Failed attempt on right femoral and right IJ access. Time to ROSC estimated over 30 minutes. Hgb dropped 12 to 8.8  12/31 on 3 pressors and stress dose steroids. In DKA. Insulin gtt started. LMWH placed on hold.  MRSA PCR screen June 14, 2023 > neg  BC x 2 1/1  > one /two pos gpc +  staph hominis. Unasyn started, subsequently was switched to meropenem in ICU finished all his antibiotics on 05/29/2022. 1/3 transferred to [facility] central venous catheter removed.  Blood cultures repeated.  Repeat echo: EF 50 to 55% no regional wall abnormality grade 1 diastolic dysfunction RV normal Renal function improving  with Lasix.  Weaning pressors.  Adding Seroquel and clonazepam to assist with weaning 1/7 ETT exchange, bronch for ongoing hemoptysis 1/10 no hemoptysis; resuming heparin today; off levo 1/11 starting to wake up a bit, nephro decr lasix and incr FWF   1/12 more mucus, having incr peaks, changing sedation  1/13 finally awake following commands off vasopressors passed SBT.  Trial of extubation. 1/15 transferred to [facility].   Subjective:    [name] today has, No headache, No chest pain, No abdominal pain - No Nausea, No new weakness tingling or numbness, no shortness of breath.   Assessment  & Plan :    Acute PE in a patient with hypercoagulable state who was noncompliant with his Xarelto for a few days at home.  Complicated by PEA arrest. He has been treated in ICU with Altepase and anticoagulation, required endotracheal intubation and mechanical ventilation for several days, remained intubated for close to 10 days and finally extubated on 05/28/2022.  Also developed hospital/ventilator acquired pneumonia for which she has finished all his antibiotics on 05/29/2022.  Is currently on oral Eliquis for his DVT PE, nasal cannula oxygen and daytime and nighttime BiPAP as needed.  Hemodynamically stable and improving.   Acute PE and DVT.  Due to noncompliance with Xarelto at home, counseled on compliance, received IVC filter placement, currently on Eliquis.  Toxic and metabolic encephalopathy.  No focal deficits, mentation gradually improving continue to monitor.  Encephalopathy due to combination of PEA arrest, AKI and septic shock due to pneumonia.  AKI, dehydration and  hypernatremia.  Seen by nephrology.  Intravascularly appears dehydrated on 05/30/2022, hold Lasix today gentle D5W for 10 hours and monitor.  Renal function is improving.  AKI likely due to shock.  COPD and OSA.  Supportive care nighttime CPAP.  Morbid obesity.  BMI 43.  Follow-up with PCP for weight loss.  Septic shock with Serratia, Enterobacter hospital-acquired pneumonia along with Staph hominis bacteremia.  Septic shock treated in ICU, he has finished his IV antibiotics, shock pathophysiology has resolved.  Continue to monitor.  Paroxysmal atrial fibrillation.  [place] vas 2 score of greater than 4.  On amiodarone and Eliquis combination continue to monitor.  Anemia of acute/critical illness along with some blood loss initially in ICU due to right groin hematoma at the site of arterial stick.  Stable for now.  Continue to monitor on Eliquis.  History of gastroparesis.  Monitor on bowel regimen and Reglan.  Hypothyroidism.  On Synthroid.    DM type II.  Placed on Semglee and every 4 sliding scale.  Monitor and adjust.  Lab Results  Component Value Date   HGBA1C 11.4 (H) 05/12/2022   CBG (last 3)  Recent Labs    05/29/22 2315 05/30/22 0404 05/30/22 0739  GLUCAP 271* 300* 305*         Condition - Extremely Guarded  Family Communication  :  [name] [phone] on 05/30/22  Code Status :  Full  Consults  :  PCCM, Renal,   PUD Prophylaxis :    Procedures  :            Disposition Plan  :    Status is: Inpatient  DVT Prophylaxis  :     apixaban (ELIQUIS) tablet 5 mg     Lab Results  Component Value Date   PLT 225 05/30/2022    Diet :  Diet Order             Diet NPO time  specified Except for: Ice Chips  Diet effective now                    Inpatient Medications  Scheduled Meds:  amiodarone  200 mg Per Tube Daily   apixaban  5 mg Per Tube BID   budesonide (PULMICORT) nebulizer solution  0.5 mg Nebulization BID   Chlorhexidine Gluconate  Cloth  6 each Topical Daily   chromium picolinate  600 mcg Per Tube Daily   docusate  100 mg Per Tube BID   feeding supplement (PROSource TF20)  60 mL Per Tube TID   free water  300 mL Per Tube Q4H   insulin aspart  0-20 Units Subcutaneous Q4H   insulin glargine-yfgn  20 Units Subcutaneous Daily   ipratropium  0.5 mg Nebulization Q6H   levalbuterol  0.63 mg Nebulization Q6H   levothyroxine  200 mcg Per Tube QAC breakfast   metoCLOPramide (REGLAN) injection  5 mg Intravenous Q6H   multivitamin  1 tablet Per Tube Daily   mouth rinse  15 mL Mouth Rinse 4 times per day   Continuous Infusions:  sodium chloride Stopped (05/28/22 1639)   dextrose     feeding supplement (VITAL 1.5 CAL) 60 mL/hr at 05/30/22 0600   PRN Meds:.acetaminophen **OR** acetaminophen, albuterol, mouth rinse, sennosides     Objective:   Vitals:   05/30/22 0600 05/30/22 0742 05/30/22 0749 05/30/22 0800  BP: (!) 149/72     Pulse: 95     Resp: (!) 25     Temp:  99.9 F (37.7 C) 98.2 F (36.8 C)   TempSrc:  Axillary Oral   SpO2: 93%   94%  Weight:      Height:        Wt Readings from Last 3 Encounters:  05/30/22 (!) 161 kg  11/08/21 (!) 173.7 kg  07/20/20 (!) 164.4 kg     Intake/Output Summary (Last 24 hours) at 05/30/2022 0805 Last data filed at 05/30/2022 0800 Gross per 24 hour  Intake 9834.53 ml  Output 3553 ml  Net 6281.53 ml     Physical Exam  Awake Alert, No new F.N deficits, diffuse weakness, NG tube in place, [place].AT,PERRAL Supple Neck, No JVD,   Symmetrical Chest wall movement, Good air movement bilaterally, CTAB RRR,No Gallops,Rubs or new Murmurs,  +ve B.Sounds, Abd Soft, No tenderness,   Trace leg edema    RN pressure injury documentation: Pressure Injury 05/18/22 Vertebral column Medial;Mid Deep Tissue Pressure Injury - Purple or maroon localized area of discolored intact skin or blood-filled blister due to damage of underlying soft tissue from pressure and/or shear. maroon line on  midbac (Active)  05/18/22 1430  Location: Vertebral column  Location Orientation: Medial;Mid  Staging: Deep Tissue Pressure Injury - Purple or maroon localized area of discolored intact skin or blood-filled blister due to damage of underlying soft tissue from pressure and/or shear.  Wound Description (Comments): maroon line on midback measuring 11.5x1  Present on Admission: No  Dressing Type Foam - Lift dressing to assess site every shift 05/29/22 2000     Pressure Injury 05/18/22 Vertebral column Lower;Medial;Mid Deep Tissue Pressure Injury - Purple or maroon localized area of discolored intact skin or blood-filled blister due to damage of underlying soft tissue from pressure and/or shear. maroon line on  (Active)  05/18/22 1430  Location: Vertebral column  Location Orientation: Lower;Medial;Mid  Staging: Deep Tissue Pressure Injury - Purple or maroon localized area of discolored intact skin or blood-filled blister  due to damage of underlying soft tissue from pressure and/or shear.  Wound Description (Comments): maroon line on lower back with skin tearing measuring 8x0.5  Present on Admission: No  Dressing Type Foam - Lift dressing to assess site every shift 05/29/22 2000      Data Review:    Recent Labs  Lab 05/26/22 2343 05/27/22 1447 05/28/22 0414 05/29/22 0355 05/30/22 0047  WBC 13.4* 14.3* 13.7* 13.2* 11.9*  HGB 8.8* 8.6* 7.9* 8.3* 9.2*  HCT 27.9* 27.9* 25.6* 26.6* 30.2*  PLT 203 212 183 197 225  MCV 98.2 99.6 98.5 99.3 101.3*  MCH 31.0 30.7 30.4 31.0 30.9  MCHC 31.5 30.8 30.9 31.2 30.5  RDW 19.7* 19.8* 19.4* 19.2* 19.6*    Recent Labs  Lab 05/24/22 0539 05/24/22 1523 05/25/22 0304 05/25/22 1246 05/25/22 1800 05/25/22 1858 05/26/22 0303 05/26/22 0857 05/26/22 2343 05/27/22 1334 05/28/22 0414 05/29/22 0355 05/30/22 0047  NA 142  --  146*   < >  --  149* 150*   < > 148* 148* 147* 149* 154*  K 3.8  --  3.5   < >  --  3.0* 3.5   < > 3.7 4.0 3.9 3.3* 4.3  CL  99  --  99  --   --  103 106   < > 105 105 105 109 114*  CO2 24  --  29  --   --  29 30   < > 29 27 29 29 28   ANIONGAP 19*  --  18*  --   --  17* 14   < > 14 16* 13 11 12   GLUCOSE 284*  --  438*  --   --  183* 128*   < > 259* 277* 300* 192* 313*  BUN 131*  --  134*  --   --  136* 132*   < > 129* 128* 125* 118* 109*  CREATININE 8.56*  --  7.68*  --   --  7.27* 6.74*   < > 5.71* 5.31* 4.94* 4.15* 3.49*  ALBUMIN  --   --   --   --   --   --   --   --   --  2.1*  --   --   --   AMMONIA  --   --   --   --   --  41*  --   --   --   --   --   --   --   BNP  --   --   --   --   --   --   --   --   --   --   --   --  64.0  MG 2.1 2.2 2.3  --  2.3  --  2.3  --   --   --   --   --   --   CALCIUM 6.7*  --  7.2*  --   --  7.4* 7.5*   < > 7.5* 7.6* 7.4* 8.0* 8.4*   < > = values in this interval not displayed.      Radiology Reports DG Chest Port 1 View  Result Date: 05/30/2022 CLINICAL DATA:  62 year old male with shortness of breath. EXAM: PORTABLE CHEST 1 VIEW COMPARISON:  Portable chest 05/27/2022 and earlier. FINDINGS: Portable AP semi upright views at 0653 hours. Enteric feeding tube in place and courses into the proximal duodenum, tip not included. Mildly lower lung volumes. Mild  cardiomegaly. Stable cardiac size and mediastinal contours. Allowing for portable technique the lungs are clear. No pneumothorax or pleural effusion. Surgical clips at thoracic inlet, probably thyroidectomy related. No acute osseous abnormality identified. Paucity of bowel gas in the visible abdomen. IMPRESSION: 1. Lower lung volumes, no acute cardiopulmonary abnormality. 2. Enteric feeding tube courses into the duodenum, tip not included. Electronically Signed   By: Genevie Ann M.D.   On: 05/30/2022 07:10   DG CHEST PORT 1 VIEW  Result Date: 05/27/2022 CLINICAL DATA:  Acute respiratory failure with hypoxia. EXAM: PORTABLE CHEST 1 VIEW COMPARISON:  Chest radiograph 05/22/2022 FINDINGS: The endotracheal tube tip is approximately  3.7 cm from the carina. Two enteric catheres are in place, the tips are not seen. A left subclavian venous catheter is seen. The tip is partially obscured but likely terminates in the upper SVC. The heart is enlarged, unchanged. The upper mediastinal contours are prominent, also unchanged. There is vascular congestion without definite overt pulmonary edema. A small right pleural effusion is again seen with probable right basilar atelectasis. There is no other focal airspace disease. There is no significant left effusion. There is no pneumothorax There is no acute osseous abnormality. IMPRESSION: Small right pleural effusion with probable adjacent right basilar atelectasis, not significantly changed. No other focal airspace disease. Electronically Signed   By: Valetta Mole M.D.   On: 05/27/2022 11:43      Signature  -   Lala Lund M.D on 05/30/2022 at 8:05 AM   -  To page go to [email or web address]

## 2022-05-30 NOTE — Progress Notes (Signed)
Speech Language Pathology Treatment: Dysphagia  Patient Details Name: Frank Shelton. MRN: [id number] DOB: 1961/03/21 Today's Date: 05/30/2022 Time: 0865-7846 SLP Time Calculation (min) (ACUTE ONLY): 10 min  Assessment / Plan / Recommendation Clinical Impression  Pt continues with a post-extubation dysphagia and is not yet ready for instrumental swallow study. Continues with weak, hoarse vocal quality, frequent throat-clearing after ice chips and coughing with tspns water, concerning for glottal insufficiency during the swallow.  Anticipate improvements with more time post-extubation. Continue cortrak feeds, ice chips after oral care. SLP will follow for readiness. D/W RN.   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  Pt was intubated from 12/30-1/13.  CXR reported small right pleural effusion with probable adjacent right basilar  Atelectasis.      SLP Plan  Continue with current plan of care      Recommendations for follow up therapy are one component of a multi-disciplinary discharge planning process, led by the attending physician.  Recommendations may be updated based on patient status, additional functional criteria and insurance authorization.    Recommendations  Diet recommendations: NPO Medication Administration: Via alternative means                Oral Care Recommendations: Oral care QID;Staff/trained caregiver to provide oral care;Oral care prior to ice chip/H20 Follow Up Recommendations: Skilled nursing-short term rehab (<3 hours/day) Assistance recommended at discharge: Frequent or constant Supervision/Assistance SLP Visit Diagnosis: Dysphagia, oropharyngeal phase (R13.12) Plan: Continue with current plan of care         Copan. Tivis Ringer, MA  CCC/SLP Clinical Specialist - Acute Care SLP Acute Rehabilitation Services Office number [phone]   [name]  05/30/2022, 10:49 AM

## 2022-05-30 NOTE — Progress Notes (Signed)
Nutrition Follow-up  DOCUMENTATION CODES:   Morbid obesity  INTERVENTION:   Tube Feeding via Cortrak (PP):  Vital 1.5 at 60 ml/hr Pro-Source TF20 60 mL TID Goal regimen provides: 2400 kcal, 157 grams of protein, 1094 mL H2O daily    Current free water flushes of 300 mL q 4 hours with TF at goal rate provides 2894 mL of free water.   Pt will like require scheduled q 4 hour insulin coverage in addition to long acting and sliding scale while on TF. Noted DM coordinator recommendations, agree with their recommendations   NUTRITION DIAGNOSIS:   Inadequate oral intake related to acute illness as evidenced by NPO status.  Being addressed via TF  GOAL:   Patient will meet greater than or equal to 90% of their needs  Met via TF   MONITOR:   Diet advancement, Labs, Weight trends, TF tolerance, Skin  REASON FOR ASSESSMENT:   Consult, Ventilator Enteral/tube feeding initiation and management  ASSESSMENT:   Pt admitted with chest pain and difficulty breathing, found to have multiple pulmonary emboli and LLE DVT. PMH significant for PE, DVT, DM, HTN, gout, COPD, OSA.  12/28: Admitted 12/30: Cardiac arrest, Intubated 01/03: Transferred from [facility] to [facility] ICU, TF initiated 01/05: large volume of emesis overnight and tube feeds held; Cortrak tube placed with tip in proximal duodenum and trickle tube feeds started 1/07: ETT exchange, bronch 1/08: began titration of TF to goal rate  1/13: Extubated  NPO, SLP evaluated today and pt with post extubation dysphagia and not ready for instrumental swallow study  Vital 1.5 at 60 ml/hr via Cortrak, tolerating  Hypernatremia persists,  noted D5 at 100 ml/hr for 10 hours ordered today. Pt also receiving free water flush of 300 mL q 4 hours  BUN/Creatinine trending down.   CBGs remain an issue, CBGs >300s; noted ss novolog q 4  hours and 20 units semglee today. Pt no longer on q 4 hour TF coverage or levemir BID  Labs: sodium 154  (H), BUN 109, Creatinine 3.49 Meds: reglan, rena-vite, ss novolog, semglee   Diet Order:   Diet Order             Diet NPO time specified Except for: Ice Chips  Diet effective now                   EDUCATION NEEDS:   No education needs have been identified at this time  Skin:  Skin Assessment: Skin Integrity Issues: Skin Integrity Issues:: DTI DTI: vertebral column  Last BM:  1/15 type 6 small  Height:   Ht Readings from Last 1 Encounters:  05/18/22 6\' 4"  (1.93 m)    Weight:   Wt Readings from Last 1 Encounters:  05/30/22 (!) 161 kg    Ideal Body Weight:  91.8 kg  BMI:  Body mass index is 43.2 kg/m.  Estimated Nutritional Needs:   Kcal:  2200-2400  Protein:  140-160g  Fluid:  1L + UOP   Kerman Passey MS, RDN, LDN, CNSC Registered Dietitian 3 Clinical Nutrition RD Pager and On-Call Pager Number Located in [place]

## 2022-05-30 NOTE — Evaluation (Signed)
Occupational Therapy Evaluation Patient Details Name: Frank Shelton. 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  Pt was intubated from 12/30-1/13.  CXR reported small right pleural effusion with probable adjacent right basilar Atelectasis. Significant PMH: PE/DVT, DM, HTN, gout, COPD, OSA.   Clinical Impression   Frank Shelton was evaluated s/p the above admission list, he is generally mod I/indep at baseline. Upon evaluation he had functional limitations due to lethargy, weakness, poor activity tolerance, obesity, flat affect, impaired cognition and debility. Overall he needed total A +2 for bed mobility for bed pan, hygiene and sling placement, and was dependently lifted from the bed>chair. Trunk exercises listed below completed from the chair. Due to the deficits listed below, he requires max A - total A for all ADLs. OT to continue to follow acutely. Recommend d/c to AIR for maximal functional progress given good family support, age and rehab potential.      Recommendations for follow up therapy are one component of a multi-disciplinary discharge planning process, led by the attending physician.  Recommendations may be updated based on patient status, additional functional criteria and insurance authorization.   Follow Up Recommendations  Acute inpatient rehab (3hours/day)     Assistance Recommended at Discharge Frequent or constant Supervision/Assistance  Patient can return home with the following A lot of help with walking and/or transfers;A lot of help with bathing/dressing/bathroom;Direct supervision/assist for medications management;Direct supervision/assist for financial management;Assist for  transportation;Help with stairs or ramp for entrance    Functional Status Assessment  Patient has had a recent decline in their functional status and demonstrates the ability to make significant improvements in function in a reasonable and predictable amount of time.  Equipment Recommendations  Other (comment) (defer)    Recommendations for Other Services Rehab consult     Precautions / Restrictions Precautions Precautions: Fall Precaution Comments: cortrak Restrictions Weight Bearing Restrictions: No      Mobility Bed Mobility Overal bed mobility: Needs Assistance Bed Mobility: Rolling Rolling: Total assist         General bed mobility comments: +3 for rolling L&R    Transfers Overall transfer level: Needs assistance Equipment used: Ambulation equipment used Transfers: Bed to chair/wheelchair/BSC               Transfer via Lift Equipment: [place] Overall balance assessment: Needs assistance Sitting-balance support: Feet supported Sitting balance-Leahy Scale: Poor Sitting balance - Comments: needs trunk support Postural control: Right lateral lean                                 ADL either performed or assessed with clinical judgement   ADL Overall ADL's : Needs assistance/impaired Eating/Feeding: NPO   Grooming: Maximal assistance;Bed level Grooming Details (indicate cue type and reason): hand over hand to reach face Upper Body Bathing: Maximal assistance   Lower Body Bathing: Total assistance   Upper Body Dressing : Maximal assistance   Lower Body Dressing: Total assistance   Toilet Transfer: Total assistance   Toileting- Clothing Manipulation and Hygiene: Total assistance       Functional mobility during ADLs:  Total assistance General ADL Comments: +3 for bed mobility, hoyer to the chair. hand over hand for participation     Vision Baseline Vision/History: 1 Wears glasses Vision Assessment?: Vision impaired- to  be further tested in functional context Additional Comments: difficult to asses - pt closing L eye but denies DV. unable to read clock on the wall from bed     Perception Perception Perception Tested?: No   Praxis Praxis Praxis tested?: Not tested    Pertinent Vitals/Pain Pain Assessment Pain Assessment: Faces Faces Pain Scale: Hurts little more Pain Location: generalized with movement Pain Descriptors / Indicators: Discomfort, Grimacing Pain Intervention(s): Limited activity within patient's tolerance, Monitored during session     Hand Dominance     Extremity/Trunk Assessment Upper Extremity Assessment Upper Extremity Assessment: RUE deficits/detail;LUE deficits/detail RUE Deficits / Details: minimal movement against gravity, globally 2/5. not able to reach to nose, required support at elbow and hand RUE Coordination: decreased fine motor;decreased gross motor LUE Deficits / Details: minimal movement against gravity, globally 2/5. not able to reach to nose, required support at elbow and hand LUE Coordination: decreased gross motor;decreased fine motor   Lower Extremity Assessment Lower Extremity Assessment: Defer to PT evaluation   Cervical / Trunk Assessment Cervical / Trunk Assessment: Other exceptions Cervical / Trunk Exceptions: increased body habitus   Communication Communication Communication: Expressive difficulties   Cognition Arousal/Alertness: Awake/alert, Lethargic Behavior During Therapy: Flat affect Overall Cognitive Status: Impaired/Different from baseline Area of Impairment: Attention, Memory, Following commands, Safety/judgement, Awareness, Problem solving                   Current Attention Level: Sustained Memory: Decreased short-term memory Following Commands: Follows one step commands inconsistently Safety/Judgement: Decreased awareness of safety, Decreased awareness of deficits Awareness: Emergent Problem Solving: Slow processing, Requires  verbal cues General Comments: flat throughout, minimal responses to simple questions with significantly increased time. self-limited     General Comments  VSS with [place], family present    Exercises Exercises: Other exercises Other Exercises Other Exercises: pull to unsupported sitting in recliner with +2 min-max A Other Exercises: trunk rotation to L&R with 10 second hold        Home Living Family/patient expects to be discharged to:: Private residence Living Arrangements: Children Available Help at Discharge: Family Type of Home: House Home Access: Stairs to enter Technical brewer of Steps: 2 Entrance Stairs-Rails: None Home Layout: One level     Bathroom Shower/Tub: Tub/shower unit         Home Equipment: Cane - single point          Prior Functioning/Environment Prior Level of Function : Independent/Modified Independent             Mobility Comments: does not work          OT Problem List: Decreased strength;Decreased range of motion;Decreased activity tolerance;Impaired balance (sitting and/or standing);Decreased safety awareness;Decreased knowledge of use of DME or AE;Decreased knowledge of precautions;Impaired UE functional use;Cardiopulmonary status limiting activity;Obesity;Decreased cognition      OT Treatment/Interventions: Self-care/ADL training;Therapeutic exercise;DME and/or AE instruction;Therapeutic activities;Patient/family education;Balance training    OT Goals(Current goals can be found in the care plan section) Acute Rehab OT Goals Patient Stated Goal: to rest OT Goal Formulation: With patient Time For Goal Achievement: 06/13/22 Potential to Achieve Goals: Good ADL Goals Pt Will Perform Grooming: with mod assist;sitting Pt Will Perform Upper Body Dressing: sitting;with mod assist Pt/caregiver will Perform Home Exercise Program: Increased ROM;Increased strength;Left upper extremity;With minimal assist;With  written HEP  provided Additional ADL Goal #1: Pt will complete bed mobility with mod A as a precursor to ADLs Additional ADL Goal #2: Pt will tolerate sitting unsupported EOB for 5 minutes with min G as a precursoe to ADLs  OT Frequency: Min 2X/week    Co-evaluation PT/OT/SLP Co-Evaluation/Treatment: Yes Reason for Co-Treatment: Complexity of the patient's impairments (multi-system involvement);For patient/therapist safety;To address functional/ADL transfers PT goals addressed during session: Mobility/safety with mobility OT goals addressed during session: ADL's and self-care      AM-PAC OT "6 Clicks" Daily Activity     Outcome Measure Help from another person eating meals?: Total Help from another person taking care of personal grooming?: A Lot Help from another person toileting, which includes using toliet, bedpan, or urinal?: Total Help from another person bathing (including washing, rinsing, drying)?: A Lot Help from another person to put on and taking off regular upper body clothing?: A Lot Help from another person to put on and taking off regular lower body clothing?: Total 6 Click Score: 9   End of Session Equipment Utilized During Treatment: Other (comment) Herma Mering) Nurse Communication: Mobility status  Activity Tolerance: Patient tolerated treatment well Patient left: in chair;with call bell/phone within reach;with family/visitor present  OT Visit Diagnosis: Unsteadiness on feet (R26.81);Muscle weakness (generalized) (M62.81)                Time: 2202-5427 OT Time Calculation (min): 31 min Charges:  OT General Charges $OT Visit: 1 Visit OT Evaluation $OT Eval Moderate Complexity: 1 Mod   Wisam Siefring D Causey 05/30/2022, 1:44 PM

## 2022-05-30 NOTE — Progress Notes (Signed)
Inpatient Rehab Admissions Coordinator:   Per therapy recommendations, patient was screened for CIR candidacy by Malick Netz, MS, CCC-SLP. At this time, Pt. is not yet at a level to tolerate the intensity of CIR; however,   Pt. may have potential to progress to becoming a potential CIR candidate, so CIR admissions team will follow and monitor for progress and participation with therapies and place consult order if Pt. appears to be an appropriate candidate. Please contact me with any questions.   Deneisha Dade, MS, CCC-SLP Rehab Admissions Coordinator  [phone] (celll) [phone] (office)  

## 2022-05-31 ENCOUNTER — Inpatient Hospital Stay (HOSPITAL_COMMUNITY): Payer: Medicare HMO

## 2022-05-31 DIAGNOSIS — I2609 Other pulmonary embolism with acute cor pulmonale: Secondary | ICD-10-CM | POA: Diagnosis not present

## 2022-05-31 LAB — CBC
HCT: 29 % — ABNORMAL LOW (ref 39.0–52.0)
Hemoglobin: 8.8 g/dL — ABNORMAL LOW (ref 13.0–17.0)
MCH: 30.9 pg (ref 26.0–34.0)
MCHC: 30.3 g/dL (ref 30.0–36.0)
MCV: 101.8 fL — ABNORMAL HIGH (ref 80.0–100.0)
Platelets: 243 10*3/uL (ref 150–400)
RBC: 2.85 MIL/uL — ABNORMAL LOW (ref 4.22–5.81)
RDW: 19.5 % — ABNORMAL HIGH (ref 11.5–15.5)
WBC: 10.5 10*3/uL (ref 4.0–10.5)
nRBC: 0.2 % (ref 0.0–0.2)

## 2022-05-31 LAB — BLOOD GAS, ARTERIAL
Acid-Base Excess: 4 mmol/L — ABNORMAL HIGH (ref 0.0–2.0)
Bicarbonate: 27.6 mmol/L (ref 20.0–28.0)
Drawn by: 44135
O2 Saturation: 96.8 %
Patient temperature: 37.5
pCO2 arterial: 38 mmHg (ref 32–48)
pH, Arterial: 7.47 — ABNORMAL HIGH (ref 7.35–7.45)
pO2, Arterial: 68 mmHg — ABNORMAL LOW (ref 83–108)

## 2022-05-31 LAB — AMMONIA: Ammonia: 36 umol/L — ABNORMAL HIGH (ref 9–35)

## 2022-05-31 LAB — GLUCOSE, CAPILLARY
Glucose-Capillary: 260 mg/dL — ABNORMAL HIGH (ref 70–99)
Glucose-Capillary: 265 mg/dL — ABNORMAL HIGH (ref 70–99)
Glucose-Capillary: 271 mg/dL — ABNORMAL HIGH (ref 70–99)
Glucose-Capillary: 311 mg/dL — ABNORMAL HIGH (ref 70–99)
Glucose-Capillary: 356 mg/dL — ABNORMAL HIGH (ref 70–99)
Glucose-Capillary: 377 mg/dL — ABNORMAL HIGH (ref 70–99)

## 2022-05-31 LAB — BASIC METABOLIC PANEL
Anion gap: 11 (ref 5–15)
BUN: 89 mg/dL — ABNORMAL HIGH (ref 8–23)
CO2: 26 mmol/L (ref 22–32)
Calcium: 8.8 mg/dL — ABNORMAL LOW (ref 8.9–10.3)
Chloride: 118 mmol/L — ABNORMAL HIGH (ref 98–111)
Creatinine, Ser: 2.81 mg/dL — ABNORMAL HIGH (ref 0.61–1.24)
GFR, Estimated: 24 mL/min — ABNORMAL LOW (ref >60)
Glucose, Bld: 293 mg/dL — ABNORMAL HIGH (ref 70–99)
Potassium: 4.2 mmol/L (ref 3.5–5.1)
Sodium: 155 mmol/L — ABNORMAL HIGH (ref 135–145)

## 2022-05-31 LAB — PROCALCITONIN: Procalcitonin: 0.24 ng/mL

## 2022-05-31 LAB — MAGNESIUM: Magnesium: 2.6 mg/dL — ABNORMAL HIGH (ref 1.7–2.4)

## 2022-05-31 LAB — BRAIN NATRIURETIC PEPTIDE: B Natriuretic Peptide: 80.9 pg/mL (ref 0.0–100.0)

## 2022-05-31 LAB — CORTISOL: Cortisol, Plasma: 14.7 ug/dL

## 2022-05-31 LAB — C-REACTIVE PROTEIN: CRP: 1.8 mg/dL — ABNORMAL HIGH (ref <1.0)

## 2022-05-31 MED ORDER — BISACODYL 10 MG RE SUPP
10.0000 mg | Freq: Every day | RECTAL | Status: DC
  Administered 2022-05-31: 10 mg via RECTAL
  Filled 2022-05-31: qty 1

## 2022-05-31 MED ORDER — LEVOTHYROXINE SODIUM 100 MCG/5ML IV SOLN
150.0000 ug | Freq: Every day | INTRAVENOUS | Status: DC
  Administered 2022-05-31 – 2022-06-01 (×2): 150 ug via INTRAVENOUS
  Filled 2022-05-31 (×3): qty 10

## 2022-05-31 MED ORDER — BISACODYL 10 MG RE SUPP
10.0000 mg | Freq: Every day | RECTAL | Status: AC
  Administered 2022-06-01: 10 mg via RECTAL
  Filled 2022-05-31: qty 1

## 2022-05-31 MED ORDER — INSULIN GLARGINE-YFGN 100 UNIT/ML ~~LOC~~ SOLN
30.0000 [IU] | Freq: Every day | SUBCUTANEOUS | Status: DC
  Administered 2022-06-01 – 2022-06-02 (×2): 30 [IU] via SUBCUTANEOUS
  Filled 2022-05-31 (×3): qty 0.3

## 2022-05-31 MED ORDER — LACTULOSE 10 GM/15ML PO SOLN
30.0000 g | Freq: Three times a day (TID) | ORAL | Status: DC
  Administered 2022-05-31: 30 g via ORAL
  Filled 2022-05-31: qty 60

## 2022-05-31 MED ORDER — INSULIN GLARGINE-YFGN 100 UNIT/ML ~~LOC~~ SOLN
10.0000 [IU] | Freq: Once | SUBCUTANEOUS | Status: AC
  Administered 2022-05-31: 10 [IU] via SUBCUTANEOUS
  Filled 2022-05-31: qty 0.1

## 2022-05-31 MED ORDER — LACTULOSE 10 GM/15ML PO SOLN
30.0000 g | Freq: Three times a day (TID) | ORAL | Status: AC
  Administered 2022-05-31: 30 g via ORAL
  Filled 2022-05-31: qty 60

## 2022-05-31 MED ORDER — METOPROLOL TARTRATE 25 MG PO TABS
25.0000 mg | ORAL_TABLET | Freq: Two times a day (BID) | ORAL | Status: DC
  Administered 2022-05-31 – 2022-06-09 (×19): 25 mg via NASOGASTRIC
  Filled 2022-05-31 (×19): qty 1

## 2022-05-31 MED ORDER — DEXTROSE 5 % IV SOLN: INTRAVENOUS | Status: DC

## 2022-05-31 NOTE — Progress Notes (Signed)
Inpatient Diabetes Program Recommendations  AACE/ADA: New Consensus Statement on Inpatient Glycemic Control (2015)  Target Ranges:  Prepandial:   less than 140 mg/dL      Peak postprandial:   less than 180 mg/dL (1-2 hours)      Critically ill patients:  140 - 180 mg/dL   Lab Results  Component Value Date   GLUCAP 265 (H) 05/31/2022   HGBA1C 11.4 (H) 05/12/2022    Review of Glycemic Control  Latest Reference Range & Units 05/30/22 04:04 05/30/22 07:39 05/30/22 11:46  Glucose-Capillary 70 - 99 mg/dL 300 (H) 305 (H) 299 (H)    Latest Reference Range & Units 05/30/22 07:39 05/30/22 11:46 05/30/22 16:31 05/30/22 19:55 05/30/22 20:24 05/30/22 23:57 05/31/22 03:51 05/31/22 08:48  Glucose-Capillary 70 - 99 mg/dL 305 (H) 299 (H) 308 (H) 289 (H) 343 (H) 311 (H) 271 (H) 265 (H)   Diabetes history: DM 2 Outpatient Diabetes medications:  None listed Current orders for Inpatient glycemic control:  Semglee 20 units daily Novolog 0-20 units q 4 hours Vital at 60 ml/hr  Inpatient Diabetes Program Recommendations:    Note patient was on Levemir 55 units bid on 05/28/22 plus tube feed coverage.  Both were d/c'd on 1/14.  Note Semglee restarted today.  Patient received 71 units of Novolog on 05/29/22.   Consider: -    increasing Semglee to 20 units bid -    adding Novolog tube feed coverage 5 units q 4 hours.    Will follow.   Thanks,  Tama Headings RN, MSN, BC-ADM Inpatient Diabetes Coordinator Team Pager [phone] (8a-5p)

## 2022-05-31 NOTE — Plan of Care (Signed)
  Problem: Education: Goal: Ability to describe self-care measures that may prevent or decrease complications (Diabetes Survival Skills Education) will improve Outcome: Progressing Goal: Individualized Educational Video(s) Outcome: Progressing   Problem: Coping: Goal: Ability to adjust to condition or change in health will improve Outcome: Progressing   Problem: Fluid Volume: Goal: Ability to maintain a balanced intake and output will improve Outcome: Progressing   Problem: Health Behavior/Discharge Planning: Goal: Ability to identify and utilize available resources and services will improve Outcome: Progressing Goal: Ability to manage health-related needs will improve Outcome: Progressing   Problem: Metabolic: Goal: Ability to maintain appropriate glucose levels will improve Outcome: Progressing   Problem: Nutritional: Goal: Maintenance of adequate nutrition will improve Outcome: Progressing Goal: Progress toward achieving an optimal weight will improve Outcome: Progressing   Problem: Skin Integrity: Goal: Risk for impaired skin integrity will decrease Outcome: Progressing   Problem: Tissue Perfusion: Goal: Adequacy of tissue perfusion will improve Outcome: Progressing   Problem: Education: Goal: Knowledge of General Education information will improve Description: Including pain rating scale, medication(s)/side effects and non-pharmacologic comfort measures Outcome: Progressing   Problem: Health Behavior/Discharge Planning: Goal: Ability to manage health-related needs will improve Outcome: Progressing   Problem: Clinical Measurements: Goal: Ability to maintain clinical measurements within normal limits will improve Outcome: Progressing Goal: Will remain free from infection Outcome: Progressing Goal: Diagnostic test results will improve Outcome: Progressing Goal: Respiratory complications will improve Outcome: Progressing Goal: Cardiovascular complication will  be avoided Outcome: Progressing   Problem: Activity: Goal: Risk for activity intolerance will decrease Outcome: Progressing   Problem: Nutrition: Goal: Adequate nutrition will be maintained Outcome: Progressing   Problem: Coping: Goal: Level of anxiety will decrease Outcome: Progressing   Problem: Elimination: Goal: Will not experience complications related to bowel motility Outcome: Progressing Goal: Will not experience complications related to urinary retention Outcome: Progressing   Problem: Pain Managment: Goal: General experience of comfort will improve Outcome: Progressing   Problem: Safety: Goal: Ability to remain free from injury will improve Outcome: Progressing   Problem: Skin Integrity: Goal: Risk for impaired skin integrity will decrease Outcome: Progressing   Problem: Safety: Goal: Non-violent Restraint(s) Outcome: Progressing   Problem: Education: Goal: Ability to describe self-care measures that may prevent or decrease complications (Diabetes Survival Skills Education) will improve Outcome: Progressing Goal: Individualized Educational Video(s) Outcome: Progressing   Problem: Cardiac: Goal: Ability to maintain an adequate cardiac output will improve Outcome: Progressing   Problem: Health Behavior/Discharge Planning: Goal: Ability to identify and utilize available resources and services will improve Outcome: Progressing Goal: Ability to manage health-related needs will improve Outcome: Progressing   Problem: Fluid Volume: Goal: Ability to achieve a balanced intake and output will improve Outcome: Progressing   Problem: Metabolic: Goal: Ability to maintain appropriate glucose levels will improve Outcome: Progressing   Problem: Nutritional: Goal: Maintenance of adequate nutrition will improve Outcome: Progressing Goal: Maintenance of adequate weight for body size and type will improve Outcome: Progressing   Problem: Respiratory: Goal:  Will regain and/or maintain adequate ventilation Outcome: Progressing   Problem: Urinary Elimination: Goal: Ability to achieve and maintain adequate renal perfusion and functioning will improve Outcome: Progressing   

## 2022-05-31 NOTE — Progress Notes (Signed)
Patient family asked if patient could have a slushie since patient is allowed ice chips.  I contacted Dr. Candiss Norse who advised patient could have slushies, as long as it was small portions at a time. I relayed information to patient and family.

## 2022-05-31 NOTE — Care Management Important Message (Signed)
Important Message  Patient Details  Name: Frank Shelton. MRN: [id number] Date of Birth: [birth year]   Medicare Important Message Given:  Yes     Eddy Termine 05/31/2022, 3:37 PM

## 2022-05-31 NOTE — Progress Notes (Signed)
PROGRESS NOTE                                                                                                                                                                                                             Patient Demographics:    Frank Shelton, is a 62 y.o. 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  He had some bleeding from his groin at the site of arterial stick in ICU, he also went into AKI.  He was seen by [facility] and nephrology, he was intubated for several days finally extubated on 05/28/2022, he also developed pneumonia for which she finished on antibiotics on 05/29/2022.  He is currently diffusely weak, has NG tube for feeding. He was transferred to hospitalist service on 05/30/2022, he is still diffusely weak, bedbound with NG tube feed dependence.  Still has severe hypernatremia with improving renal function.   Significant Hospital Events: Including procedures, antibiotic start and stop dates in addition to other pertinent events   06-09-23: Cardiac arrest while in BR. CPR  initiated. TNK administered during CPR, fem cvl  placed/ post arrest shock, high dose pressors. Failed attempt on right femoral and right IJ access. Time to ROSC estimated over 30 minutes. Hgb dropped 12 to 8.8  12/31 on 3 pressors and stress dose steroids. In DKA. Insulin gtt started. LMWH placed on hold.  MRSA PCR screen June 09, 2023 > neg  BC x 2 1/1  > one /two pos gpc +  staph hominis. Unasyn started, subsequently was switched to meropenem in ICU finished all his antibiotics on 05/29/2022. 1/3 transferred to [facility] central venous catheter removed.  Blood cultures repeated.  Repeat echo: EF 50 to 55% no regional wall abnormality grade 1 diastolic dysfunction RV normal Renal function improving  with Lasix.  Weaning pressors.  Adding Seroquel and clonazepam to assist with weaning 1/7 ETT exchange, bronch for ongoing hemoptysis 1/10 no hemoptysis; resuming heparin today; off levo 1/11 starting to wake up a bit, nephro decr lasix and incr FWF   1/12 more mucus, having incr peaks, changing sedation  1/13 finally awake following commands off vasopressors passed SBT.  Trial of extubation. 1/15 transferred to [facility].   Subjective:    [name] today remains in bed in no distress sleeping but easily arousable denies any headache chest or abdominal pain.  Nods yes to say that he is comfortable.   Assessment  & Plan :    Acute PE in a patient with hypercoagulable state who was noncompliant with his Xarelto for a few days at home.  Complicated by PEA arrest. He has been treated in ICU with Altepase and anticoagulation, required endotracheal intubation and mechanical ventilation for several days, remained intubated for close to 10 days and finally extubated on 05/28/2022.  Also developed hospital/ventilator acquired pneumonia for which she has finished all his antibiotics on 05/29/2022.  Is currently on oral Eliquis for his DVT PE, nasal cannula oxygen and daytime and nighttime BiPAP as needed.  Hemodynamically stable and improving.   Acute PE and DVT.  Due to noncompliance with Xarelto at home, counseled on compliance, received IVC filter placement, currently on Eliquis.  Toxic and metabolic encephalopathy.  No focal deficits, mentation gradually improving continue to monitor.  Encephalopathy due to combination of PEA arrest, AKI and septic shock due to pneumonia.  Repeat CT  scan on 05/31/2022 negative, ABG stable, ammonia level ordered, stable bladder scan and chest x-ray, does have some evidence of constipation for which she will be placed on bowel regimen.  Mentation stable but still not at his baseline.  His encephalopathy could be coming from myxedema.  AKI, dehydration and hypernatremia.  Seen by nephrology.  Intravascularly appears dehydrated on 05/30/2022, hold Lasix today gentle D5W for 10 hours and monitor.  Renal function is improving.  AKI likely due to shock.  COPD and OSA.  Supportive care nighttime CPAP.  Morbid obesity.  BMI 43.  Follow-up with PCP for weight loss.  Septic shock with Serratia, Enterobacter hospital-acquired pneumonia along with Staph hominis bacteremia.  Septic shock treated in ICU, he has finished his IV antibiotics, shock pathophysiology has resolved.  Continue to monitor.  Paroxysmal atrial fibrillation.  [place] vas 2 score of greater than 4.  On amiodarone and Eliquis combination continue to monitor.  Hypertension.  Placed on beta-blocker and monitor.    Anemia of acute/critical illness along with some blood loss initially in ICU due to right groin hematoma at the site of arterial stick.  Stable for now.  Continue to monitor on Eliquis.  History of gastroparesis.  Monitor on bowel regimen and Reglan.  Myxedema with history of hypothyroidism.  Likely has early myxedema with TSH in excess of 55, switch to IV Synthroid and monitor.  Cortisol added to morning blood work however blood pressure is on the higher side suggesting he likely is not adrenal insufficient.  DM type II.  Placed on Semglee and every 4 sliding scale.  Monitor and adjust.  Lab Results  Component Value Date   HGBA1C 11.4 (H) 05/12/2022   CBG (last 3)  Recent Labs    05/30/22 2357 05/31/22 0351 05/31/22 0848  GLUCAP 311* 271* 265*         Condition - Extremely Guarded  Family Communication  :  Son Barkley [phone] on 05/30/22  Code Status :   Full  Consults  :  PCCM, Renal,   PUD Prophylaxis :    Procedures  :            Disposition Plan  :    Status is: Inpatient  DVT Prophylaxis  :    Place TED hose Start: 05/30/22 0807 apixaban (ELIQUIS) tablet 5 mg     Lab Results  Component Value Date   PLT 243 05/31/2022    Diet :  Diet Order             Diet NPO time specified Except for: Ice Chips  Diet effective now                    Inpatient Medications  Scheduled Meds:  amiodarone  200 mg Per Tube Daily   apixaban  5 mg Per Tube BID   bisacodyl  10 mg Rectal Q0600   budesonide (PULMICORT) nebulizer solution  0.5 mg Nebulization BID   Chlorhexidine Gluconate Cloth  6 each Topical Daily   chromium picolinate  600 mcg Per Tube Daily   feeding supplement (PROSource TF20)  60 mL Per Tube TID   free water  300 mL Per Tube Q4H   insulin aspart  0-20 Units Subcutaneous Q4H   insulin glargine-yfgn  20 Units Subcutaneous Daily   ipratropium  0.5 mg Nebulization BID   lactulose  30 g Oral TID   levalbuterol  0.63 mg Nebulization BID   levothyroxine  200 mcg Per Tube QAC breakfast   metoCLOPramide (REGLAN) injection  5 mg Intravenous Q6H   multivitamin  1 tablet Per Tube Daily   mouth rinse  15 mL Mouth Rinse 4 times per day   Continuous Infusions:  sodium chloride Stopped (05/28/22 1639)   feeding supplement (VITAL 1.5 CAL) 60 mL/hr at 05/30/22 1600   PRN Meds:.acetaminophen **OR** acetaminophen, albuterol, mouth rinse, sennosides     Objective:   Vitals:   05/31/22 0800 05/31/22 0808 05/31/22 0846 05/31/22 0900  BP:   (!) 165/86   Pulse: 99 100  (!) 103  Resp: (!) 33 (!) 32  (!) 32  Temp: 97.7 F (36.5 C)  98.5 F (36.9 C)   TempSrc: Oral  Axillary   SpO2: 91% 91%  95%  Weight:      Height:        Wt Readings from Last 3 Encounters:  05/30/22 (!) 161 kg  11/08/21 (!) 173.7 kg  07/20/20 (!) 164.4 kg     Intake/Output Summary (Last 24 hours) at 05/31/2022 1023 Last data filed at  05/31/2022 0543 Gross per 24 hour  Intake 3629.37 ml  Output 3400 ml  Net 229.37 ml     Physical Exam  Sleeping but easily arousable, denies any headache or chest pain, moves all 4 extremities to commands, NG tube in place [place].AT,PERRAL Supple Neck, No JVD,   Symmetrical Chest wall movement, Good air movement bilaterally, CTAB RRR,No Gallops, Rubs or new Murmurs,  +ve B.Sounds, Abd Soft, No tenderness,   Trace edema      RN pressure injury documentation: Pressure Injury 05/18/22 Vertebral column Medial;Mid Deep Tissue Pressure Injury - Purple or maroon localized area of discolored intact skin or blood-filled blister due to damage of underlying soft tissue from pressure and/or shear. maroon line on midbac (Active)  05/18/22 1430  Location: Vertebral column  Location Orientation: Medial;Mid  Staging: Deep Tissue Pressure Injury - Purple or maroon  localized area of discolored intact skin or blood-filled blister due to damage of underlying soft tissue from pressure and/or shear.  Wound Description (Comments): maroon line on midback measuring 11.5x1  Present on Admission: No  Dressing Type Foam - Lift dressing to assess site every shift 05/30/22 2015     Pressure Injury 05/18/22 Vertebral column Lower;Medial;Mid Deep Tissue Pressure Injury - Purple or maroon localized area of discolored intact skin or blood-filled blister due to damage of underlying soft tissue from pressure and/or shear. maroon line on  (Active)  05/18/22 1430  Location: Vertebral column  Location Orientation: Lower;Medial;Mid  Staging: Deep Tissue Pressure Injury - Purple or maroon localized area of discolored intact skin or blood-filled blister due to damage of underlying soft tissue from pressure and/or shear.  Wound Description (Comments): maroon line on lower back with skin tearing measuring 8x0.5  Present on Admission: No  Dressing Type Foam - Lift dressing to assess site every shift 05/30/22 2015      Data  Review:    Recent Labs  Lab 05/27/22 1447 05/28/22 0414 05/29/22 0355 05/30/22 0047 05/31/22 0531  WBC 14.3* 13.7* 13.2* 11.9* 10.5  HGB 8.6* 7.9* 8.3* 9.2* 8.8*  HCT 27.9* 25.6* 26.6* 30.2* 29.0*  PLT 212 183 197 225 243  MCV 99.6 98.5 99.3 101.3* 101.8*  MCH 30.7 30.4 31.0 30.9 30.9  MCHC 30.8 30.9 31.2 30.5 30.3  RDW 19.8* 19.4* 19.2* 19.6* 19.5*    Recent Labs  Lab  0000 05/24/22 1523 05/25/22 0304 05/25/22 1246 05/25/22 1800 05/25/22 1858 05/26/22 0303 05/26/22 0857 05/27/22 1334 05/28/22 0414 05/29/22 0355 05/30/22 0047 05/30/22 1110 05/31/22 0531  NA  --   --  146*   < >  --  149* 150*   < > 148* 147* 149* 154*  --  155*  K  --   --  3.5   < >  --  3.0* 3.5   < > 4.0 3.9 3.3* 4.3  --  4.2  CL   < >  --  99  --   --  103 106   < > 105 105 109 114*  --  118*  CO2   < >  --  29  --   --  29 30   < > 27 29 29 28   --  26  ANIONGAP   < >  --  18*  --   --  17* 14   < > 16* 13 11 12   --  11  GLUCOSE   < >  --  438*  --   --  183* 128*   < > 277* 300* 192* 313*  --  293*  BUN   < >  --  134*  --   --  136* 132*   < > 128* 125* 118* 109*  --  89*  CREATININE   < >  --  7.68*  --   --  7.27* 6.74*   < > 5.31* 4.94* 4.15* 3.49*  --  2.81*  ALBUMIN  --   --   --   --   --   --   --   --  2.1*  --   --   --   --   --   CRP  --   --   --   --   --   --   --   --   --   --   --   --  2.6* 1.8*  PROCALCITON  --   --   --   --   --   --   --   --   --   --   --   --  0.32 0.24  TSH  --   --   --   --   --   --   --   --   --   --   --  55.162*  --   --   AMMONIA  --   --   --   --   --  41*  --   --   --   --   --   --   --   --   BNP  --   --   --   --   --   --   --   --   --   --   --  64.0  --  80.9  MG  --  2.2 2.3  --  2.3  --  2.3  --   --   --   --   --   --  2.6*  CALCIUM   < >  --  7.2*  --   --  7.4* 7.5*   < > 7.6* 7.4* 8.0* 8.4*  --  8.8*   < > = values in this interval not displayed.      Radiology Reports CT HEAD WO CONTRAST (5MM)  Result Date:  05/31/2022 CLINICAL DATA:  Delirium. EXAM: CT HEAD WITHOUT CONTRAST TECHNIQUE: Contiguous axial images were obtained from the base of the skull through the vertex without intravenous contrast. RADIATION DOSE REDUCTION: This exam was performed according to the departmental dose-optimization program which includes automated exposure control, adjustment of the mA and/or kV according to patient size and/or use of iterative reconstruction technique. COMPARISON:  None Available. FINDINGS: Brain: No midline shift, ventriculomegaly, mass effect, evidence of mass lesion, intracranial hemorrhage or evidence of cortically based acute infarction. Gray-white matter differentiation is within normal limits throughout the brain. Vascular: No hyperdense vessel. Skull: No suspicious bone lesions.  Skull base is unremarkable. Sinuses/Orbits: Near-complete opacification of the sphenoid sinuses. Partially visualized nasal enteric tube. Trace fluid in the bilateral mastoids. Other: None. IMPRESSION: No acute intracranial abnormality. Electronically Signed   By: Emmit Alexanders M.D   On: 05/31/2022 09:55   DG Abd Portable 1V  Result Date: 05/31/2022 CLINICAL DATA:  Constipation EXAM: PORTABLE ABDOMEN - 1 VIEW COMPARISON:  Plain film of the abdomen dated 05/20/2022. FINDINGS: Enteric tube projects into the proximal duodenum. Visualized portion of the bowel gas pattern is unremarkable no dilated large or small bowel loops appreciated. Portion of the upper abdomen and RIGHT abdomen is excluded. Moderate amount of stool is seen within the visualized portion of the colon. No evidence of free intraperitoneal air is seen. Probable RIGHT renal stone. IVC filter in place. IMPRESSION: 1. Nonobstructive bowel gas pattern, although portions of the bowel are excluded on this exam. Moderate amount of stool is seen within the visualized portion of the colon. 2. Enteric tube projects into the proximal duodenum. 3. Probable RIGHT renal stone.  Electronically Signed   By: Franki Cabot M.D.   On: 05/31/2022 09:20   DG Chest Port 1 View  Result Date: 05/31/2022 CLINICAL DATA:  Shortness of breath, constipation. EXAM: PORTABLE CHEST 1 VIEW COMPARISON:  Chest x-rays dated 05/30/2022, 05/27/2022 and 05/14/2022. Chest CT dated 05/12/2022. FINDINGS: Stable cardiomegaly. Central pulmonary vascular congestion  and mild diffuse interstitial prominence. Probable mild atelectasis and/or small pleural effusion at the LEFT lung base. No pneumothorax is seen. Enteric tube passes below the diaphragm. IMPRESSION: 1. Cardiomegaly with central pulmonary vascular congestion and mild diffuse interstitial prominence, compatible with CHF/volume overload. 2. Probable mild atelectasis and/or small pleural effusion at the LEFT lung base. Electronically Signed   By: Bary Richard M.D.   On: 05/31/2022 09:17   DG Chest Port 1 View  Result Date: 05/30/2022 CLINICAL DATA:  62 year old male with shortness of breath. EXAM: PORTABLE CHEST 1 VIEW COMPARISON:  Portable chest 05/27/2022 and earlier. FINDINGS: Portable AP semi upright views at 0653 hours. Enteric feeding tube in place and courses into the proximal duodenum, tip not included. Mildly lower lung volumes. Mild cardiomegaly. Stable cardiac size and mediastinal contours. Allowing for portable technique the lungs are clear. No pneumothorax or pleural effusion. Surgical clips at thoracic inlet, probably thyroidectomy related. No acute osseous abnormality identified. Paucity of bowel gas in the visible abdomen. IMPRESSION: 1. Lower lung volumes, no acute cardiopulmonary abnormality. 2. Enteric feeding tube courses into the duodenum, tip not included. Electronically Signed   By: Odessa Fleming M.D.   On: 05/30/2022 07:10   DG CHEST PORT 1 VIEW  Result Date: 05/27/2022 CLINICAL DATA:  Acute respiratory failure with hypoxia. EXAM: PORTABLE CHEST 1 VIEW COMPARISON:  Chest radiograph 05/22/2022 FINDINGS: The endotracheal tube tip is  approximately 3.7 cm from the carina. Two enteric catheres are in place, the tips are not seen. A left subclavian venous catheter is seen. The tip is partially obscured but likely terminates in the upper SVC. The heart is enlarged, unchanged. The upper mediastinal contours are prominent, also unchanged. There is vascular congestion without definite overt pulmonary edema. A small right pleural effusion is again seen with probable right basilar atelectasis. There is no other focal airspace disease. There is no significant left effusion. There is no pneumothorax There is no acute osseous abnormality. IMPRESSION: Small right pleural effusion with probable adjacent right basilar atelectasis, not significantly changed. No other focal airspace disease. Electronically Signed   By: Lesia Hausen M.D.   On: 05/27/2022 11:43      Signature  -   Susa Raring M.D on 05/31/2022 at 10:23 AM   -  To page go to [email or web address]

## 2022-06-01 DIAGNOSIS — I2609 Other pulmonary embolism with acute cor pulmonale: Secondary | ICD-10-CM | POA: Diagnosis not present

## 2022-06-01 LAB — TSH: TSH: 62.001 u[IU]/mL — ABNORMAL HIGH (ref 0.350–4.500)

## 2022-06-01 LAB — GLUCOSE, CAPILLARY
Glucose-Capillary: 310 mg/dL — ABNORMAL HIGH (ref 70–99)
Glucose-Capillary: 347 mg/dL — ABNORMAL HIGH (ref 70–99)
Glucose-Capillary: 353 mg/dL — ABNORMAL HIGH (ref 70–99)
Glucose-Capillary: 353 mg/dL — ABNORMAL HIGH (ref 70–99)
Glucose-Capillary: 358 mg/dL — ABNORMAL HIGH (ref 70–99)
Glucose-Capillary: 363 mg/dL — ABNORMAL HIGH (ref 70–99)
Glucose-Capillary: 367 mg/dL — ABNORMAL HIGH (ref 70–99)

## 2022-06-01 LAB — COMPREHENSIVE METABOLIC PANEL
ALT: 25 U/L (ref 0–44)
AST: 34 U/L (ref 15–41)
Albumin: 2.4 g/dL — ABNORMAL LOW (ref 3.5–5.0)
Alkaline Phosphatase: 55 U/L (ref 38–126)
Anion gap: 9 (ref 5–15)
BUN: 80 mg/dL — ABNORMAL HIGH (ref 8–23)
CO2: 22 mmol/L (ref 22–32)
Calcium: 8.7 mg/dL — ABNORMAL LOW (ref 8.9–10.3)
Chloride: 123 mmol/L — ABNORMAL HIGH (ref 98–111)
Creatinine, Ser: 2.73 mg/dL — ABNORMAL HIGH (ref 0.61–1.24)
GFR, Estimated: 25 mL/min — ABNORMAL LOW (ref >60)
Glucose, Bld: 380 mg/dL — ABNORMAL HIGH (ref 70–99)
Potassium: 4.4 mmol/L (ref 3.5–5.1)
Sodium: 154 mmol/L — ABNORMAL HIGH (ref 135–145)
Total Bilirubin: 0.8 mg/dL (ref 0.3–1.2)
Total Protein: 6.8 g/dL (ref 6.5–8.1)

## 2022-06-01 LAB — C-REACTIVE PROTEIN: CRP: 1.3 mg/dL — ABNORMAL HIGH (ref <1.0)

## 2022-06-01 LAB — PROCALCITONIN: Procalcitonin: 0.14 ng/mL

## 2022-06-01 LAB — MAGNESIUM: Magnesium: 2.6 mg/dL — ABNORMAL HIGH (ref 1.7–2.4)

## 2022-06-01 LAB — BRAIN NATRIURETIC PEPTIDE: B Natriuretic Peptide: 55 pg/mL (ref 0.0–100.0)

## 2022-06-01 LAB — OSMOLALITY, URINE: Osmolality, Ur: 535 mOsm/kg (ref 300–900)

## 2022-06-01 MED ORDER — INSULIN GLARGINE-YFGN 100 UNIT/ML ~~LOC~~ SOLN
15.0000 [IU] | Freq: Once | SUBCUTANEOUS | Status: AC
  Administered 2022-06-01: 15 [IU] via SUBCUTANEOUS
  Filled 2022-06-01: qty 0.15

## 2022-06-01 MED ORDER — FREE WATER: 100.0000 mL | Status: DC

## 2022-06-01 MED ORDER — DEXTROSE 5 % IV SOLN
INTRAVENOUS | Status: DC
  Administered 2022-06-01: 125 mL/h via INTRAVENOUS

## 2022-06-01 MED ORDER — FREE WATER: 500.0000 mL | Freq: Four times a day (QID) | Status: DC

## 2022-06-01 NOTE — Progress Notes (Signed)
Pt has upper right groin skin open,  excoiation, which is a slit and is red. Area was cleaned with soap and water and barrier cream applied.

## 2022-06-01 NOTE — Progress Notes (Signed)
Physical Therapy Treatment Patient Details Name: Frank Shelton. 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  Pt was intubated from 12/30-1/13.  CXR reported small right pleural effusion with probable adjacent right basilar Atelectasis. Significant PMH: PE/DVT, DM, HTN, gout, COPD, OSA.    PT Comments    Pt limited during today's session by lethargy, family at bedside throughout session. Pt responding to ~75% of questions with head nods or words, able to assist in repositioning to decrease leaning on R bed-rail after being positioned in chair position in bed. Pt's BLE taken through ROM with some assistance for pt. Attempted to get pt to participate in further bed mobility and/or sit EOB however pt declining any attempts today. Pt's family educated on ROM of BUE and BLE when present and pt not working with therapy, pt's mother eager to assist. Pt will continue to benefit from skilled acute PT at this time to continue to progress mobility, strength, and balance. Current discharge recommendation remains appropriate.     Recommendations for follow up therapy are one component of a multi-disciplinary discharge planning process, led by the attending physician.  Recommendations may be updated based on patient status, additional functional criteria and insurance authorization.  Follow Up Recommendations  Acute inpatient rehab (3hours/day)     Assistance Recommended at Discharge Frequent or constant Supervision/Assistance  Patient can return home with the following Two people to help with walking and/or transfers;Two people to help with bathing/dressing/bathroom;Assistance with cooking/housework;Direct supervision/assist for  medications management;Direct supervision/assist for financial management;Assist for transportation;Help with stairs or ramp for entrance   Equipment Recommendations  Other (comment) (defer to next level of care)    Recommendations for Other Services       Precautions / Restrictions Precautions Precautions: Fall Precaution Comments: cortrak Restrictions Weight Bearing Restrictions: No     Mobility  Bed Mobility               General bed mobility comments: pt declining attempts of rolling or sittting EOB, requiring maxA to reposition once in chair position as pt leaning against R bedrail    Transfers                        Ambulation/Gait                   Stairs             Wheelchair Mobility    Modified Rankin (Stroke Patients Only)       Balance       Sitting balance - Comments: chair position in bed, declining attempts to sit EOB Postural control: Right lateral lean (while in bed)                                  Cognition Arousal/Alertness: Lethargic Behavior During Therapy: Flat affect Overall Cognitive Status: Impaired/Different from baseline Area of Impairment: Attention, Memory, Following commands, Safety/judgement, Problem solving                   Current Attention Level: Sustained Memory: Decreased short-term memory Following  Commands: Follows one step commands inconsistently, Follows one step commands with increased time Safety/Judgement: Decreased awareness of safety, Decreased awareness of deficits   Problem Solving: Slow processing, Requires verbal cues General Comments: intermittently verbalizing and nodding head to answer questions, able to make needs known, i.e. wanting ice chips        Exercises General Exercises - Lower Extremity Ankle Circles/Pumps: AAROM, Both, 15 reps, Supine Heel Slides: AAROM, Both, 10 reps, Supine Hip ABduction/ADduction: AAROM, Both, 10 reps,  Supine Straight Leg Raises: AAROM, Both, 10 reps, Supine    General Comments General comments (skin integrity, edema, etc.): VSS on 4L O2 [place]. Family and guests present      Pertinent Vitals/Pain Pain Assessment Pain Assessment: No/denies pain    Home Living                          Prior Function            PT Goals (current goals can now be found in the care plan section) Acute Rehab PT Goals Patient Stated Goal: get some ice chips PT Goal Formulation: With patient/family Time For Goal Achievement: 06/12/22 Potential to Achieve Goals: Fair Progress towards PT goals: Progressing toward goals    Frequency    Min 3X/week      PT Plan Current plan remains appropriate    Co-evaluation     PT goals addressed during session: Strengthening/ROM        AM-PAC PT "6 Clicks" Mobility   Outcome Measure  Help needed turning from your back to your side while in a flat bed without using bedrails?: Total Help needed moving from lying on your back to sitting on the side of a flat bed without using bedrails?: Total Help needed moving to and from a bed to a chair (including a wheelchair)?: Total Help needed standing up from a chair using your arms (e.g., wheelchair or bedside chair)?: Total Help needed to walk in hospital room?: Total Help needed climbing 3-5 steps with a railing? : Total 6 Click Score: 6    End of Session Equipment Utilized During Treatment: Oxygen Activity Tolerance: Patient limited by lethargy Patient left: in bed;with call bell/phone within reach;with bed alarm set;with family/visitor present Nurse Communication: Mobility status PT Visit Diagnosis: Other abnormalities of gait and mobility (R26.89);Muscle weakness (generalized) (M62.81)     Time: 5102-5852 PT Time Calculation (min) (ACUTE ONLY): 27 min  Charges:  $Therapeutic Exercise: 8-22 mins $Therapeutic Activity: 8-22 mins                     Frank Shelton, PT DPT Acute Rehabilitation  Services Office [phone]    Luvenia Heller 06/01/2022, 4:04 PM

## 2022-06-01 NOTE — Progress Notes (Signed)
Speech Language Pathology Treatment: Dysphagia  Patient Details Name: Shafiq Larch. MRN: [id number] DOB: 13-Oct-1960 Today's Date: 06/01/2022 Time: 4580-9983 SLP Time Calculation (min) (ACUTE ONLY): 15 min  Assessment / Plan / Recommendation Clinical Impression  Patient seen by SLP for skilled treatment focused on dysphagia goals. Patient was awake but appeared somewhat fatigued. Mother who was in room told SLP that patient was able to "get out his normal voice one time" when working with PT. During this session, patient's voice was a very faint whisper. After oral care, patient was receptive to having some ice chips. He did not exhibit any overt s/s aspiration or penetration and did exhibit adequate mastication and swallow initiation. SLP recommending to continue with current NPO status but allowing PRN ice chips and will follow for readiness of objective swallow study. (FEES vs MBS).    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  Pt was intubated from 12/30-1/13.  CXR reported small right pleural effusion with probable adjacent right basilar  Atelectasis.      SLP Plan  Continue with current plan of care      Recommendations for follow up therapy are one component of a multi-disciplinary discharge planning process, led by the attending physician.  Recommendations may be updated based on patient status, additional functional criteria and insurance authorization.    Recommendations  Diet recommendations: NPO Medication Administration: Via alternative means                Oral Care Recommendations: Oral care QID;Staff/trained caregiver to provide oral care;Oral care prior to ice chip/H20 Follow Up Recommendations: Skilled nursing-short term rehab (<3 hours/day) Assistance  recommended at discharge: Frequent or constant Supervision/Assistance SLP Visit Diagnosis: Dysphagia, oropharyngeal phase (R13.12) Plan: Continue with current plan of care         Sonia Baller, MA, CCC-SLP Speech Therapy

## 2022-06-01 NOTE — Progress Notes (Signed)
Bladder scan volume is 4 ml.

## 2022-06-01 NOTE — Progress Notes (Addendum)
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  He had some bleeding from his groin at the site of arterial stick in ICU, he also went into AKI.  He was seen by [facility] and nephrology, he was intubated for several days finally extubated on 05/28/2022, he also developed pneumonia for which she finished on antibiotics on 05/29/2022.  He is currently diffusely weak, has NG tube for feeding. He was transferred to hospitalist service on 05/30/2022, he is still diffusely weak, bedbound with NG tube feed dependence.  Still has severe hypernatremia with improving renal function.   Significant Hospital Events: 06/03/2023: Cardiac arrest while in BR. CPR  initiated. TNK administered during CPR, fem cvl  placed/ post arrest shock, high dose pressors. Failed attempt on right femoral and right IJ access. Time to ROSC estimated over 30 minutes. Hgb dropped 12 to 8.8  12/31 on 3 pressors and stress dose steroids. In DKA. Insulin gtt started. LMWH placed on hold.  MRSA PCR screen 2023/06/03 > neg  BC x 2 1/1  > one /two pos gpc + staph hominis. Unasyn started, subsequently was switched to meropenem in ICU finished all his antibiotics on 05/29/2022. 1/3 transferred to [facility] central venous catheter removed.  Blood cultures repeated.  Repeat echo: EF 50 to 55% no regional wall abnormality grade 1 diastolic dysfunction RV normal Renal function improving with Lasix.  Weaning pressors.  Adding Seroquel and clonazepam to assist with weaning 1/7 ETT exchange, bronch for ongoing hemoptysis 1/10 no hemoptysis; resuming heparin today; off levo 1/11 starting to  wake up a bit, nephro decr lasix and incr FWF   1/12 more mucus, having incr peaks, changing sedation  1/13 finally awake following commands off vasopressors passed SBT.  Trial of extubation. 1/15 transferred to [facility]. 1/16 TSH elevated at 55, started on IV levothyroxine.   Subjective:  Overnight:NAEON  Somnolent but awakens to command. Denies any acute complaints.   Objective:  Vital signs in last 24 hours: Vitals:   05/31/22 2200 05/31/22 2300 06/01/22 0024 06/01/22 0441  BP:   (!) 145/78 (!) 151/71  Pulse: 92 92 94 91  Resp: 20 16 (!) 22 (!) 24  Temp:   98.1 F (36.7 C) 97.8 F (36.6 C)  TempSrc:   Oral Oral  SpO2: 92% 96% 91% 96%  Weight:      Height:       Supplemental O2: Nasal Cannula Last BM Date : 05/30/22 SpO2: 96 % O2 Flow Rate (L/min): 6 L/min FiO2 (%): 40 % Filed Weights   05/28/22 0330 05/29/22 0400 05/30/22 0400  Weight: (!) 162 kg (!) 161.4 kg (!) 161 kg    Intake/Output Summary (Last 24 hours) at 06/01/2022 8850 Last data filed at 05/31/2022 1636 Gross per 24 hour  Intake --  Output 1100 ml  Net -1100 ml   Net IO Since Admission: -3,289.69 mL [06/01/22 0605] Physical Exam Frank: somnolent HENT: NCAT Lungs:  CTAB Cardiovascular: NSR Abdomen: No TTP MSK: No asymmetry, moving all extremities Skin: no lesions noted on exam Neuro: somnolent but arouses to command, oriented  Diagnostics  Latest Ref Rng & Units 05/31/2022    5:31 AM 05/30/2022   12:47 AM 05/29/2022    3:55 AM  CBC  WBC 4.0 - 10.5 K/uL 10.5  11.9  13.2   Hemoglobin 13.0 - 17.0 g/dL 8.8  9.2  8.3   Hematocrit 39.0 - 52.0 % 29.0  30.2  26.6   Platelets 150 - 400 K/uL 243  225  197        Latest Ref Rng & Units 05/31/2022    5:31 AM 05/30/2022   12:47 AM 05/29/2022    3:55 AM  BMP  Glucose 70 - 99 mg/dL 161  096  045   BUN 8 - 23 mg/dL 89  409  811   Creatinine 0.61 - 1.24 mg/dL 9.14  7.82  9.56   Sodium 135 - 145 mmol/L 155  154  149   Potassium 3.5 - 5.1 mmol/L 4.2  4.3   3.3   Chloride 98 - 111 mmol/L 118  114  109   CO2 22 - 32 mmol/L 26  28  29    Calcium 8.9 - 10.3 mg/dL 8.8  8.4  8.0      Assessment/Plan: Pt is a 32 yowm with 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   Principal Problem:   Pulmonary embolism (HCC) Active Problems:   GOUT   OBSTRUCTIVE SLEEP APNEA   Essential hypertension   Other specified chronic obstructive pulmonary disease   Hypercoagulopathy (HCC)   DM type 2 causing vascular disease (HCC)   Upper GI bleed   Coffee ground emesis   Acute respiratory failure with hypoxia and hypercapnia (HCC)   Shock (HCC)   Acute respiratory failure (HCC)   Enterobacter cloacae pneumonia   Septic shock (HCC)   AKI (acute kidney injury) (HCC)   Encephalopathy acute   Severe sepsis (HCC)  Acute PE in a patient with hypercoagulable state who was noncompliant with his Xarelto for a few days at home.  Complicated by PEA arrest.  He has been treated in ICU with Altepase and anticoagulation, required endotracheal intubation and mechanical ventilation for several days, remained intubated for close to 10 days and finally extubated on 05/28/2022.  Also developed hospital/ventilator acquired pneumonia for which she has finished all his antibiotics on 05/29/2022.   Is currently on oral Eliquis for his DVT PE, nasal cannula oxygen and daytime and nighttime BiPAP as needed.  Hemodynamically stable and improving.   Myxedema with history of hypothyroidism   Toxic and metabolic encephalopathy No focal deficits, mentation gradually improving continue to monitor. Likely has early myxedema with TSH in excess of 55, switch to IV Synthroid and monitor.  Cortisol was normal ruling out adrenal insufficiency.  Appears pt non-complaint with multiple home meds prior to admission including levothyroxine. Given 150 mcg IV synthroid yesterday, will continue this. Expect improvement in 5-7 days.   AKI, dehydration and hypernatremia.  Seen by nephrology. Intravascularly appears dehydrated on 05/31/2022, hold Lasix today gentle D5W at 120 cc/hr and monitor.  Renal function is improving. AKI likely due to shock. -Trend daily BMP   DM type II.  Placed on Semglee 30 units and every 4hr resistant sliding scale.  Monitor and adjust. CGM elevated >300  given pt on D5 gtt. BMP shows persistent hypernatremia, so we will continue D5W. Will order 10 units Semglee on daily 30 units for today to reduce hyperglycemia but will hold scheduling 40 units daily to prevent hypoglycemia.  -Insulin as above -Monitor sugars closely.   COPD and OSA.   Chronic. Supportive care nighttime CPAP.   Morbid obesity.  BMI 43.   Chronic. Follow-up with PCP for weight loss.   Septic shock with Serratia, Enterobacter hospital-acquired pneumonia along with Staph hominis bacteremia.   Resolved. Septic shock treated in ICU, he has finished his IV antibiotics, shock pathophysiology has resolved.  Continue to monitor.   Paroxysmal atrial fibrillation.  Mali vas 2 score of greater than 4.   -On amiodarone and Eliquis combination continue to monitor.   Hypertension.   Chronic. Placed on lopressor 25 mg BID. CTM.     Anemia of acute/critical illness along with some blood loss initially in ICU due to right groin hematoma at the site of arterial stick.  Stable for now.  Continue to monitor on Eliquis.   History of gastroparesis.  Monitor on bowel regimen and Reglan.   Diet: NPO IVF: D5,110cc/hr VTE: Eliquis Code: Full PT/OT recs: CIR, none. Prior to Admission Living Arrangement:Home Anticipated Discharge Location:CIR Barriers to Discharge:Medical workup  Dispo: Anticipated discharge to CIR in approximately 2 day(s).   Idamae Schuller,  MD Tillie Rung. [facility] Internal Medicine Residency, PGY-2  Pager: [phone] After 5 pm and on weekends: Please call the on-call pager    Attestation  I have directly reviewed the clinical findings, lab results and imaging studies. I have interviewed and examined the patient and agree with the documentation and management as recorded by Dr. Humphrey Rolls.  Patient continues to be intravascularly dehydrated with hypernatremia, placed on continuous free water flush through NG tube along with D5W, insulin dose adjusted, also being treated with IV Synthroid for his myxedema.  Mentation ever so slowly improving.  Lala Lund M.D on 06/01/2022 at 9:35 AM  [facility] Office  [phone]

## 2022-06-02 DIAGNOSIS — I2609 Other pulmonary embolism with acute cor pulmonale: Secondary | ICD-10-CM | POA: Diagnosis not present

## 2022-06-02 LAB — COMPREHENSIVE METABOLIC PANEL
ALT: 25 U/L (ref 0–44)
AST: 30 U/L (ref 15–41)
Albumin: 2.3 g/dL — ABNORMAL LOW (ref 3.5–5.0)
Alkaline Phosphatase: 54 U/L (ref 38–126)
Anion gap: 7 (ref 5–15)
BUN: 75 mg/dL — ABNORMAL HIGH (ref 8–23)
CO2: 22 mmol/L (ref 22–32)
Calcium: 8.6 mg/dL — ABNORMAL LOW (ref 8.9–10.3)
Chloride: 120 mmol/L — ABNORMAL HIGH (ref 98–111)
Creatinine, Ser: 2.59 mg/dL — ABNORMAL HIGH (ref 0.61–1.24)
GFR, Estimated: 27 mL/min — ABNORMAL LOW (ref >60)
Glucose, Bld: 423 mg/dL — ABNORMAL HIGH (ref 70–99)
Potassium: 4.1 mmol/L (ref 3.5–5.1)
Sodium: 149 mmol/L — ABNORMAL HIGH (ref 135–145)
Total Bilirubin: 0.8 mg/dL (ref 0.3–1.2)
Total Protein: 6.7 g/dL (ref 6.5–8.1)

## 2022-06-02 LAB — GLUCOSE, CAPILLARY
Glucose-Capillary: 389 mg/dL — ABNORMAL HIGH (ref 70–99)
Glucose-Capillary: 329 mg/dL — ABNORMAL HIGH (ref 70–99)
Glucose-Capillary: 359 mg/dL — ABNORMAL HIGH (ref 70–99)
Glucose-Capillary: 285 mg/dL — ABNORMAL HIGH (ref 70–99)
Glucose-Capillary: 244 mg/dL — ABNORMAL HIGH (ref 70–99)

## 2022-06-02 LAB — TSH: TSH: 57.316 u[IU]/mL — ABNORMAL HIGH (ref 0.350–4.500)

## 2022-06-02 LAB — PROCALCITONIN: Procalcitonin: 0.16 ng/mL

## 2022-06-02 LAB — MAGNESIUM: Magnesium: 2.4 mg/dL (ref 1.7–2.4)

## 2022-06-02 LAB — C-REACTIVE PROTEIN: CRP: 1.7 mg/dL — ABNORMAL HIGH (ref <1.0)

## 2022-06-02 LAB — BRAIN NATRIURETIC PEPTIDE: B Natriuretic Peptide: 52.1 pg/mL (ref 0.0–100.0)

## 2022-06-02 MED ORDER — FREE WATER
200.0000 mL | Status: DC
  Administered 2022-06-02 – 2022-06-03 (×2): 200 mL

## 2022-06-02 MED ORDER — LEVOTHYROXINE SODIUM 100 MCG/5ML IV SOLN
200.0000 ug | Freq: Every day | INTRAVENOUS | Status: DC
  Administered 2022-06-02 – 2022-06-08 (×7): 200 ug via INTRAVENOUS
  Filled 2022-06-02 (×7): qty 10

## 2022-06-02 NOTE — Progress Notes (Signed)
Occupational Therapy Treatment Patient Details Name: Frank Shelton. 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  Pt was intubated from 12/30-1/13.  CXR reported small right pleural effusion with probable adjacent right basilar Atelectasis. Significant PMH: PE/DVT, DM, HTN, gout, COPD, OSA.   OT comments  Patient more alert this session, and attempting to provide AA for bilateral upper body there-x.  Total assist to reposition in bed due to decreased initiation.  Patient able to grasp wash cloth, and used to wipe his face once with assist for thoroughness.  OT will continue efforts in the acute setting to address deficits, and assist with potential admit to AIR for post acute rehab.     Recommendations for follow up therapy are one component of a multi-disciplinary discharge planning process, led by the attending physician.  Recommendations may be updated based on patient status, additional functional criteria and insurance authorization.    Follow Up Recommendations  Acute inpatient rehab (3hours/day)     Assistance Recommended at Discharge Frequent or constant Supervision/Assistance  Patient can return home with the following  A lot of help with walking and/or transfers;A lot of help with bathing/dressing/bathroom;Direct supervision/assist for medications management;Direct supervision/assist for financial management;Assist for transportation;Help with stairs or ramp for entrance   Equipment Recommendations  Other (comment)    Recommendations for Other Services Rehab consult    Precautions / Restrictions Precautions Precautions: Fall Precaution Comments: cortrak Restrictions Weight Bearing Restrictions:  No       Mobility Bed Mobility   Bed Mobility: Rolling Rolling: Max assist              Transfers                         Balance                                             Extremity/Trunk Assessment Upper Extremity Assessment RUE Deficits / Details: continues grossly 2/5.  Minimal initiation. RUE Coordination: decreased fine motor;decreased gross motor LUE Deficits / Details: continues grossly 2/5. Minimal initiation. LUE Coordination: decreased gross motor;decreased fine motor   Lower Extremity Assessment Lower Extremity Assessment: Defer to PT evaluation   Cervical / Trunk Assessment Cervical / Trunk Assessment: Other exceptions Cervical / Trunk Exceptions: increased body habitus    Vision   Vision Assessment?: Vision impaired- to be further tested in functional context Additional Comments: patient able to make eye contact, and did track OT in room   Perception Perception Perception: Not tested   Praxis Praxis Praxis: Not tested    Cognition Arousal/Alertness: Lethargic Behavior During Therapy: Flat affect Overall Cognitive Status: Difficult to assess                                          Exercises General Exercises - Upper Extremity Shoulder Flexion: AAROM, Both, 5 reps, Supine Shoulder ABduction: AAROM, Supine, 5 reps Shoulder Horizontal ADduction: AAROM,  Both, 5 reps, Supine Elbow Flexion: AAROM, Both, 5 reps, Supine Elbow Extension: AAROM, Both, 5 reps, Supine Wrist Flexion: AAROM, Both, 5 reps, Supine Wrist Extension: AAROM, Both, 5 reps, Supine Digit Composite Flexion: AAROM, Both, 5 reps, Supine Composite Extension: AAROM, Both, 5 reps, Supine Other Exercises Other Exercises: IR and ER to both shoulders supine 5 reps AA.    Shoulder Instructions       General Comments      Pertinent Vitals/ Pain       Pain Assessment Pain Assessment: Faces Faces Pain Scale: No hurt Pain  Intervention(s): Monitored during session                                                          Frequency  Min 2X/week        Progress Toward Goals  OT Goals(current goals can now be found in the care plan section)  Progress towards OT goals: Progressing toward goals  Acute Rehab OT Goals OT Goal Formulation: With patient Time For Goal Achievement: 06/13/22 Potential to Achieve Goals: Good  Plan Discharge plan remains appropriate    Co-evaluation                 AM-PAC OT "6 Clicks" Daily Activity     Outcome Measure   Help from another person eating meals?: Total Help from another person taking care of personal grooming?: A Lot Help from another person toileting, which includes using toliet, bedpan, or urinal?: Total Help from another person bathing (including washing, rinsing, drying)?: A Lot Help from another person to put on and taking off regular upper body clothing?: A Lot Help from another person to put on and taking off regular lower body clothing?: Total 6 Click Score: 9    End of Session    OT Visit Diagnosis: Unsteadiness on feet (R26.81);Muscle weakness (generalized) (M62.81)   Activity Tolerance Patient limited by lethargy   Patient Left in bed;with call bell/phone within reach   Nurse Communication Mobility status        Time: 1211-1225 OT Time Calculation (min): 14 min  Charges: OT General Charges $OT Visit: 1 Visit OT Treatments $Therapeutic Exercise: 8-22 mins  06/02/2022  RP, OTR/L  Acute Rehabilitation Services  Office:  [phone]   Metta Clines 06/02/2022, 12:48 PM

## 2022-06-02 NOTE — Progress Notes (Signed)
Inpatient Diabetes Program Recommendations  AACE/ADA: New Consensus Statement on Inpatient Glycemic Control (2015)  Target Ranges:  Prepandial:   less than 140 mg/dL      Peak postprandial:   less than 180 mg/dL (1-2 hours)      Critically ill patients:  140 - 180 mg/dL   Lab Results  Component Value Date   GLUCAP 359 (H) 06/02/2022   HGBA1C 11.4 (H) 05/12/2022    Review of Glycemic Control  Latest Reference Range & Units 06/01/22 07:55 06/01/22 12:36 06/01/22 15:38 06/01/22 20:45 06/01/22 23:31 06/02/22 03:58 06/02/22 08:31 06/02/22 11:14  Glucose-Capillary 70 - 99 mg/dL 347 (H) 353 (H) 363 (H) 353 (H) 358 (H) 389 (H) 329 (H) 359 (H)    Diabetes history: DM 2 Outpatient Diabetes medications:  None listed Current orders for Inpatient glycemic control:  Semglee 20 units daily Novolog 0-20 units q 4 hours Vital at 60 ml/hr  Inpatient Diabetes Program Recommendations:    Note patient was on Levemir 55 units bid on 05/28/22 plus tube feed coverage.  Both were d/c'd on 1/14.  Note Semglee restarted today.  Patient received 71 units of Glucose trends consistently in the mid to upper 300 range over the last 3 days.  Consider: -    increasing Semglee to 20 units bid -    adding Novolog tube feed coverage 5 units q 4 hours.    Will follow.   Thanks,  Tama Headings RN, MSN, BC-ADM Inpatient Diabetes Coordinator Team Pager [phone] (8a-5p)

## 2022-06-02 NOTE — Progress Notes (Signed)
   06/02/22 1102  Assess: MEWS Score  Temp 98.9 F (37.2 C)  BP (!) 144/80  MAP (mmHg) 98  Pulse Rate 89  ECG Heart Rate 89  Resp (!) 33  SpO2 92 %  O2 Device Room Air  Assess: MEWS Score  MEWS Temp 0  MEWS Systolic 0  MEWS Pulse 0  MEWS RR 2  MEWS LOC 0  MEWS Score 2  MEWS Score Color Yellow  Assess: if the MEWS score is Yellow or Red  Were vital signs taken at a resting state? Yes  Focused Assessment No change from prior assessment  Does the patient meet 2 or more of the SIRS criteria? No  MEWS guidelines implemented *See Row Information* Yes  Treat  MEWS Interventions Escalated (See documentation below)  Take Vital Signs  Increase Vital Sign Frequency  Yellow: Q 2hr X 2 then Q 4hr X 2, if remains yellow, continue Q 4hrs  Escalate  MEWS: Escalate Yellow: discuss with charge nurse/RN and consider discussing with provider and RRT  Notify: Charge Nurse/RN  Name of Charge Nurse/RN Notified devin  Date Charge Nurse/RN Notified 06/02/22  Document  Progress note created (see row info) Yes  Assess: SIRS CRITERIA  SIRS Temperature  0  SIRS Pulse 0  SIRS Respirations  1  SIRS WBC 0  SIRS Score Sum  1

## 2022-06-02 NOTE — Progress Notes (Addendum)
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  He had some bleeding from his groin at the site of arterial stick in ICU, he also went into AKI.  He was seen by [facility] and nephrology, he was intubated for several days finally extubated on 05/28/2022, he also developed pneumonia for which she finished on antibiotics on 05/29/2022.  He is currently diffusely weak, has NG tube for feeding. He was transferred to hospitalist service on 05/30/2022, he is still diffusely weak, bedbound with NG tube feed dependence.  Still has severe hypernatremia with improving renal function.   Significant Hospital Events: Jun 03, 2023: Cardiac arrest while in BR. CPR  initiated. TNK administered during CPR, fem cvl  placed/ post arrest shock, high dose pressors. Failed attempt on right femoral and right IJ access. Time to ROSC estimated over 30 minutes. Hgb dropped 12 to 8.8  12/31 on 3 pressors and stress dose steroids. In DKA. Insulin gtt started. LMWH placed on hold.  MRSA PCR screen 06/03/23 > neg  BC x 2 1/1  > one /two pos gpc + staph hominis. Unasyn started, subsequently was switched to meropenem in ICU finished all his antibiotics on 05/29/2022. 1/3 transferred to [facility] central venous catheter removed.  Blood cultures repeated.  Repeat echo: EF 50 to 55% no regional wall abnormality grade 1 diastolic dysfunction RV normal Renal function improving with Lasix.  Weaning pressors.  Adding Seroquel and clonazepam to assist with weaning 1/7 ETT exchange, bronch for ongoing hemoptysis 1/10 no hemoptysis; resuming heparin today; off levo 1/11 starting to  wake up a bit, nephro decr lasix and incr FWF   1/12 more mucus, having incr peaks, changing sedation  1/13 finally awake following commands off vasopressors passed SBT.  Trial of extubation. 1/15 transferred to [facility]. 1/16 TSH elevated at 55, started on IV levothyroxine.   Subjective:  Overnight:NAEON  Looks more alert than yesterday but still lethargic. No acute concerns endorsed.   Objective:  Vital signs in last 24 hours: Vitals:   06/01/22 2012 06/01/22 2019 06/01/22 2300 06/02/22 0300  BP:  (!) 142/63 127/74 132/69  Pulse:  95 92 88  Resp:  19 20 19   Temp:  98.5 F (36.9 C) 98 F (36.7 C) 98.4 F (36.9 C)  TempSrc:  Axillary Axillary Axillary  SpO2: 95% 93% 95% 93%  Weight:      Height:       Supplemental O2: Nasal Cannula Last BM Date : 06/01/22 SpO2: 93 % O2 Flow Rate (L/min): 6 L/min FiO2 (%): 40 % Filed Weights   05/28/22 0330 05/29/22 0400 05/30/22 0400  Weight: (!) 162 kg (!) 161.4 kg (!) 161 kg    Intake/Output Summary (Last 24 hours) at 06/02/2022 0710 Last data filed at 06/02/2022 0430 Gross per 24 hour  Intake 3771.09 ml  Output 3851 ml  Net -79.91 ml    Net IO Since Admission: -3,163.09 mL [06/02/22 0710] Physical Exam  General: somnolent but more alert than yesterday HENT: NCAT Lungs:  CTAB Cardiovascular: NSR Abdomen: No TTP MSK: No asymmetry, moving all extremities Skin: no lesions noted on  exam Neuro: somnolent but arouses to command, oriented  Diagnostics    Latest Ref Rng & Units 05/31/2022    5:31 AM 05/30/2022   12:47 AM 05/29/2022    3:55 AM  CBC  WBC 4.0 - 10.5 K/uL 10.5  11.9  13.2   Hemoglobin 13.0 - 17.0 g/dL 8.8  9.2  8.3   Hematocrit 39.0 - 52.0 % 29.0  30.2  26.6   Platelets 150 - 400 K/uL 243  225  197        Latest Ref Rng & Units 06/02/2022    3:10 AM 06/01/2022    8:14 AM 05/31/2022    5:31 AM  BMP  Glucose 70 - 99 mg/dL 244  010  272   BUN 8 - 23 mg/dL 75  80  89   Creatinine 0.61 - 1.24 mg/dL 5.36  6.44  0.34    Sodium 135 - 145 mmol/L 149  154  155   Potassium 3.5 - 5.1 mmol/L 4.1  4.4  4.2   Chloride 98 - 111 mmol/L 120  123  118   CO2 22 - 32 mmol/L 22  22  26    Calcium 8.9 - 10.3 mg/dL 8.6  8.7  8.8      Assessment/Plan: Pt is a 21 yowm with 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   Principal Problem:   Pulmonary embolism (HCC) Active Problems:   GOUT   OBSTRUCTIVE SLEEP APNEA   Essential hypertension   Other specified chronic obstructive pulmonary disease   Hypercoagulopathy (HCC)   DM type 2 causing vascular disease (HCC)   Upper GI bleed   Coffee ground emesis   Acute respiratory failure with hypoxia and hypercapnia (HCC)   Shock (HCC)   Acute respiratory failure (HCC)   Enterobacter cloacae pneumonia   Septic shock (HCC)   AKI (acute kidney injury) (HCC)   Encephalopathy acute   Severe sepsis (HCC)  Acute PE in a patient with hypercoagulable state who was noncompliant with his Xarelto for a few days at home. Complicated by PEA arrest.  He has been treated in ICU with Altepase and anticoagulation, required endotracheal intubation and mechanical ventilation for several days, remained intubated for close to 10 days and finally extubated on 05/28/2022.  Also developed hospital/ventilator acquired pneumonia for which she has finished all his antibiotics on 05/29/2022.   Is currently on oral Eliquis for his DVT PE, nasal cannula oxygen and daytime and nighttime BiPAP as needed.  Hemodynamically stable and improving.   Myxedema with history of hypothyroidism   Toxic and metabolic encephalopathy Improving. No focal deficits, mentation gradually improving continue to monitor. Likely has early myxedema with TSH in excess of 55, pt  switched to IV Synthroid and monitor.  Cortisol was normal ruling out adrenal insufficiency. Appears pt non-complaint with multiple home meds prior to admission including levothyroxine. Given 150 mcg IV synthroid yesterday, will continue this. Expect improvement in 5-7 days.   AKI, dehydration and hypernatremia.  Seen by nephrology. Overall improving. Intravascularly appears dehydrated, holding lasix. Was getting gentle D5W at 120 cc/hr, will dc that today. Will decrease FWF to 200 cc q2hrs. Renal function is improving. AKI likely due to shock and is improving.  Hypernatremia down-trending. -Trend daily BMP   DM type II.  Placed on Semglee 30 units and every 4hr resistant sliding scale.  Monitor and adjust. CGM elevated >300 but D5W discontinued. Will consider increasing his insulin tomorrow given D5W discontinued today.  -Insulin as above -Monitor sugars closely.   Paroxysmal atrial fibrillation.  Mali vas 2 score of greater than 4.   -On amiodarone and Eliquis combination continue to monitor.   Hypertension.   Chronic. Placed on lopressor 25 mg BID. CTM.     Anemia of acute/critical illness along with some blood loss initially in ICU due to right groin hematoma at the site of arterial stick.  Stable for now.  Continue to monitor on Eliquis.   COPD and OSA.   Chronic. Supportive care nighttime CPAP.   Morbid obesity.  BMI 43.   Chronic. Follow-up with PCP for weight loss.   Septic shock with Serratia, Enterobacter hospital-acquired pneumonia along with Staph hominis bacteremia.   Resolved. Septic shock treated in ICU, he has finished his IV antibiotics, shock pathophysiology has resolved.  Continue to monitor.    History of gastroparesis.  Monitor on bowel regimen and Reglan.   Diet: NPO IVF: D5,110cc/hr VTE: Eliquis Code: Full PT/OT recs: CIR, none. Prior to Admission Living Arrangement:Home Anticipated Discharge Location:CIR Barriers to Discharge:Medical workup  Dispo:  Anticipated discharge to CIR in approximately 2 day(s).   Idamae Schuller, MD [facility], PGY-2  Pager: [phone] After 5 pm and on weekends: Please call the on-call pager    Attestation   I have directly reviewed the clinical findings, lab results and imaging studies. I have interviewed and examined the patient and agree with the documentation and management as recorded by Dr.Khan -continue IV Synthroid for myxedema, now clinically improving, speech therapy following closely hopefully will resume oral diet in the next few days for now continue NG tube feeds along with free water flushes.  CBGs running high as he was getting some D5W that has been stopped will monitor CBGs closely if needed insulin regimen will be adjusted.  Lala Lund M.D on 06/02/2022 at 10:07 AM  [facility] Office  [phone]

## 2022-06-03 ENCOUNTER — Inpatient Hospital Stay (HOSPITAL_COMMUNITY): Payer: Medicare HMO

## 2022-06-03 DIAGNOSIS — I2609 Other pulmonary embolism with acute cor pulmonale: Secondary | ICD-10-CM | POA: Diagnosis not present

## 2022-06-03 LAB — C-REACTIVE PROTEIN: CRP: 1.4 mg/dL — ABNORMAL HIGH (ref <1.0)

## 2022-06-03 LAB — COMPREHENSIVE METABOLIC PANEL
ALT: 28 U/L (ref 0–44)
AST: 36 U/L (ref 15–41)
Albumin: 2.3 g/dL — ABNORMAL LOW (ref 3.5–5.0)
Alkaline Phosphatase: 59 U/L (ref 38–126)
Anion gap: 7 (ref 5–15)
BUN: 69 mg/dL — ABNORMAL HIGH (ref 8–23)
CO2: 21 mmol/L — ABNORMAL LOW (ref 22–32)
Calcium: 8.5 mg/dL — ABNORMAL LOW (ref 8.9–10.3)
Chloride: 119 mmol/L — ABNORMAL HIGH (ref 98–111)
Creatinine, Ser: 2.39 mg/dL — ABNORMAL HIGH (ref 0.61–1.24)
GFR, Estimated: 30 mL/min — ABNORMAL LOW (ref >60)
Glucose, Bld: 300 mg/dL — ABNORMAL HIGH (ref 70–99)
Potassium: 3.9 mmol/L (ref 3.5–5.1)
Sodium: 147 mmol/L — ABNORMAL HIGH (ref 135–145)
Total Bilirubin: 0.9 mg/dL (ref 0.3–1.2)
Total Protein: 6.6 g/dL (ref 6.5–8.1)

## 2022-06-03 LAB — GLUCOSE, CAPILLARY
Glucose-Capillary: 259 mg/dL — ABNORMAL HIGH (ref 70–99)
Glucose-Capillary: 263 mg/dL — ABNORMAL HIGH (ref 70–99)
Glucose-Capillary: 279 mg/dL — ABNORMAL HIGH (ref 70–99)
Glucose-Capillary: 286 mg/dL — ABNORMAL HIGH (ref 70–99)
Glucose-Capillary: 288 mg/dL — ABNORMAL HIGH (ref 70–99)
Glucose-Capillary: 292 mg/dL — ABNORMAL HIGH (ref 70–99)
Glucose-Capillary: 304 mg/dL — ABNORMAL HIGH (ref 70–99)
Glucose-Capillary: 325 mg/dL — ABNORMAL HIGH (ref 70–99)

## 2022-06-03 LAB — TSH: TSH: 57.128 u[IU]/mL — ABNORMAL HIGH (ref 0.350–4.500)

## 2022-06-03 LAB — PROCALCITONIN: Procalcitonin: 0.14 ng/mL

## 2022-06-03 LAB — MAGNESIUM: Magnesium: 2.2 mg/dL (ref 1.7–2.4)

## 2022-06-03 LAB — BRAIN NATRIURETIC PEPTIDE: B Natriuretic Peptide: 66.7 pg/mL (ref 0.0–100.0)

## 2022-06-03 LAB — T4, FREE: Free T4: 0.69 ng/dL (ref 0.61–1.12)

## 2022-06-03 MED ORDER — INSULIN GLARGINE-YFGN 100 UNIT/ML ~~LOC~~ SOLN
22.0000 [IU] | Freq: Two times a day (BID) | SUBCUTANEOUS | Status: DC
  Administered 2022-06-03 (×2): 22 [IU] via SUBCUTANEOUS
  Filled 2022-06-03 (×4): qty 0.22

## 2022-06-03 MED ORDER — FREE WATER: 125.0000 mL | Status: DC

## 2022-06-03 NOTE — Progress Notes (Signed)
Occupational Therapy Treatment Patient Details Name: Frank Shelton. 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  Pt was intubated from 12/30-1/13.  CXR reported small right pleural effusion with probable adjacent right basilar Atelectasis. Significant PMH: PE/DVT, DM, HTN, gout, COPD, OSA.   OT comments  Patient with fair progress and participation.  Able to sit edge of bed with Heavy Max of 2.  Patient with difficulty with command following and responses were inconsistent, and sometimes not related to the question asked.  OT can continue efforts in the acute setting to address deficits listed below.  AIR is following, but SNF may be needed depending on progress.      Recommendations for follow up therapy are one component of a multi-disciplinary discharge planning process, led by the attending physician.  Recommendations may be updated based on patient status, additional functional criteria and insurance authorization.    Follow Up Recommendations  Acute inpatient rehab (3hours/day)     Assistance Recommended at Discharge Frequent or constant Supervision/Assistance  Patient can return home with the following  A lot of help with bathing/dressing/bathroom;Direct supervision/assist for medications management;Direct supervision/assist for financial management;Assist for transportation;Help with stairs or ramp for entrance;Two people to help with walking and/or transfers   Equipment Recommendations  None recommended by OT    Recommendations for Other Services      Precautions / Restrictions Precautions Precautions: Fall Precaution Comments: cortrak Restrictions Weight Bearing Restrictions: No        Mobility Bed Mobility Overal bed mobility: Needs Assistance Bed Mobility: Rolling, Supine to Sit, Sit to Supine Rolling: Mod assist, +2 for physical assistance   Supine to sit: Max assist, +2 for physical assistance Sit to supine: Max assist, +2 for physical assistance        Transfers                         Balance Overall balance assessment: Needs assistance Sitting-balance support: Feet supported, Bilateral upper extremity supported Sitting balance-Leahy Scale: Poor Sitting balance - Comments: Max A to short periods of Min Guard Postural control: Right lateral lean, Posterior lean                                 ADL either performed or assessed with clinical judgement   ADL Overall ADL's : Needs assistance/impaired Eating/Feeding: NPO           Lower Body Bathing: Total assistance   Upper Body Dressing : Maximal assistance   Lower Body Dressing: Total assistance                      Extremity/Trunk Assessment Upper Extremity Assessment RUE Deficits / Details: continues grossly 2/5.  Minimal initiation. RUE Coordination: decreased fine motor;decreased gross motor LUE Deficits / Details: continues grossly 2/5. Minimal initiation.  Not moving LUE to command, but will move spontaneously. LUE Coordination: decreased gross motor;decreased fine motor   Lower Extremity Assessment Lower Extremity Assessment: Defer to PT evaluation   Cervical / Trunk Assessment Cervical / Trunk Assessment: Other exceptions Cervical / Trunk Exceptions:  increased body habitus                      Cognition Arousal/Alertness: Awake/alert Behavior During Therapy: Flat affect Overall Cognitive Status: Impaired/Different from baseline Area of Impairment: Attention, Following commands, Awareness                   Current Attention Level: Focused   Following Commands: Follows one step commands inconsistently   Awareness:  Intellectual Problem Solving: Slow processing, Requires verbal cues                             Pertinent Vitals/ Pain       Pain Assessment Pain Assessment: Faces Faces Pain Scale: Hurts even more Pain Location: LLE with movement Pain Descriptors / Indicators: Discomfort, Guarding, Grimacing Pain Intervention(s): Monitored during session                                                          Frequency  Min 2X/week        Progress Toward Goals  OT Goals(current goals can now be found in the care plan section)     Acute Rehab OT Goals OT Goal Formulation: With patient Time For Goal Achievement: 06/13/22 Potential to Achieve Goals: Good  Plan Discharge plan remains appropriate    Co-evaluation    PT/OT/SLP Co-Evaluation/Treatment: Yes Reason for Co-Treatment: Complexity of the patient's impairments (multi-system involvement);For patient/therapist safety          AM-PAC OT "6 Clicks" Daily Activity     Outcome Measure   Help from another person eating meals?: Total Help from another person taking care of personal grooming?: A Lot Help from another person toileting, which includes using toliet, bedpan, or urinal?: Total Help from another person bathing (including washing, rinsing, drying)?: A Lot Help from another person to put on and taking off regular upper body clothing?: A Lot Help from another person to put on and taking off regular lower body clothing?: Total 6 Click Score: 9    End of Session    OT Visit Diagnosis: Unsteadiness on feet (R26.81);Muscle weakness (generalized) (M62.81)   Activity Tolerance Patient tolerated treatment well   Patient Left in bed;with call bell/phone within reach;with family/visitor present   Nurse Communication Mobility status        Time: 1510-1533 OT Time Calculation (min): 23 min  Charges: OT General Charges $OT Visit: 1 Visit  06/03/2022  RP, OTR/L  Acute  Rehabilitation Services  Office:  [phone]   Metta Clines 06/03/2022, 3:48 PM

## 2022-06-03 NOTE — Progress Notes (Signed)
Speech Language Pathology Treatment: Dysphagia  Patient Details Name: Frank Shelton. MRN: [id number] DOB: 05-May-1961 Today's Date: 06/03/2022 Time:  -     Assessment / Plan / Recommendation Clinical Impression  Pt's swallowing status, with regard to clinical presentation, remains similar to last several days.  He demonstrates intermittent coughing when consuming ice chips. Phonation is still impacted by prolonged oral intubation - voice is generally aphonic.  Prior to initiating an oral diet he will need an instrumental swallow study. He is over the weight limit for the MBS chair. He will require FEES. I phoned his mother to obtain verbal consent but there was no answer. Will need consent form signed before we can proceed - asked RN to contact me if family is at bedside today.   Fortunately Mr. Smoker can D/C to AIR with cortrak in place. Will follow and plan for FEES as able.   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  Pt was intubated from 12/30-1/13.  CXR reported small right pleural effusion with probable adjacent right basilar  Atelectasis.      SLP Plan  Continue with current plan of care      Recommendations for follow up therapy are one component of a multi-disciplinary discharge planning process, led by the attending physician.  Recommendations may be updated based on patient status, additional functional criteria and insurance authorization.    Recommendations  Diet recommendations: NPO Medication Administration: Via alternative means                Oral Care Recommendations: Oral care QID;Oral care prior to ice chip/H20 Follow Up Recommendations: Acute inpatient rehab (3hours/day) Assistance recommended at discharge: Frequent or constant  Supervision/Assistance Plan: Continue with current plan of care        Kahlin Mark L. Tivis Ringer, MA CCC/SLP Clinical Specialist - Acute Care SLP Acute Rehabilitation Services Office number [phone]    [name]  06/03/2022, 1:09 PM

## 2022-06-03 NOTE — Progress Notes (Addendum)
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  He had some bleeding from his groin at the site of arterial stick in ICU, he also went into AKI.  He was seen by [facility] and nephrology, he was intubated for several days finally extubated on 05/28/2022, he also developed pneumonia for which she finished on antibiotics on 05/29/2022.  He is currently diffusely weak, has NG tube for feeding. He was transferred to hospitalist service on 05/30/2022, he is still diffusely weak, bedbound with NG tube feed dependence.  Still has severe hypernatremia with improving renal function.   Significant Hospital Events: June 08, 2023: Cardiac arrest while in BR. CPR  initiated. TNK administered during CPR, fem cvl  placed/ post arrest shock, high dose pressors. Failed attempt on right femoral and right IJ access. Time to ROSC estimated over 30 minutes. Hgb dropped 12 to 8.8  12/31 on 3 pressors and stress dose steroids. In DKA. Insulin gtt started. LMWH placed on hold.  MRSA PCR screen 06-08-23 > neg  BC x 2 1/1  > one /two pos gpc + staph hominis. Unasyn started, subsequently was switched to meropenem in ICU finished all his antibiotics on 05/29/2022. 1/3 [facility] catheter removed.  Blood cultures repeated.  Repeat echo: EF 50 to 55% no regional wall abnormality grade 1 diastolic dysfunction RV normal Renal function improving with Lasix.  Weaning pressors.  Adding Seroquel and clonazepam to assist with weaning 1/7 ETT exchange, bronch for ongoing hemoptysis 1/10 no hemoptysis; resuming heparin today; off levo 1/11 starting to  wake up a bit, nephro decr lasix and incr FWF   1/12 more mucus, having incr peaks, changing sedation  1/13 finally awake following commands off vasopressors passed SBT.  Trial of extubation. 1/15 transferred to [facility]. 1/16 TSH elevated at 55, started on IV levothyroxine.  1/19 Pro cal at 0.14, CRP down to 1.4, BNP normal. TSH still elevated at 57, FT4 pending.   Subjective:  Overnight:NAEON  Looks more alert than yesterday. No acute concerns endorsed.   Objective:  Vital signs in last 24 hours: Vitals:   06/02/22 1941 06/02/22 1950 06/02/22 2318 06/03/22 0300  BP:  (!) 154/73 (!) 150/110 127/74  Pulse:  90  94  Resp:  16 19   Temp:  99 F (37.2 C) 99.1 F (37.3 C) 98.8 F (37.1 C)  TempSrc:  Oral Oral Oral  SpO2: 92% 99% 93% 93%  Weight:      Height:       Supplemental O2: Nasal Cannula Last BM Date : 06/02/22 SpO2: 93 % O2 Flow Rate (L/min): 6 L/min FiO2 (%): 40 % Filed Weights   05/28/22 0330 05/29/22 0400 05/30/22 0400  Weight: (!) 162 kg (!) 161.4 kg (!) 161 kg    Intake/Output Summary (Last 24 hours) at 06/03/2022 0739 Last data filed at 06/03/2022 0600 Gross per 24 hour  Intake 2230 ml  Output 4050 ml  Net -1820 ml    Net IO Since Admission: -4,983.09 mL [06/03/22 0739] Physical Exam  General: NAD, looks alert and more talkative.  HENT: NCAT Lungs:  CTAB Cardiovascular: NSR Abdomen: No TTP MSK: No asymmetry, moving all extremities Skin: no lesions noted on exam Neuro: somnolent but arouses to command, oriented  Diagnostics    Latest Ref Rng & Units 05/31/2022    5:31 AM 05/30/2022   12:47 AM 05/29/2022    3:55 AM  CBC  WBC 4.0 - 10.5 K/uL 10.5  11.9  13.2   Hemoglobin 13.0 - 17.0 g/dL 8.8  9.2  8.3   Hematocrit 39.0 - 52.0 % 29.0  30.2  26.6   Platelets 150 - 400 K/uL 243  225  197        Latest Ref Rng & Units 06/03/2022    5:30 AM 06/02/2022    3:10 AM 06/01/2022    8:14 AM  BMP  Glucose 70 - 99 mg/dL 295  284  132   BUN 8 - 23 mg/dL 69  75   80   Creatinine 0.61 - 1.24 mg/dL 4.40  1.02  7.25   Sodium 135 - 145 mmol/L 147  149  154   Potassium 3.5 - 5.1 mmol/L 3.9  4.1  4.4   Chloride 98 - 111 mmol/L 119  120  123   CO2 22 - 32 mmol/L 21  22  22    Calcium 8.9 - 10.3 mg/dL 8.5  8.6  8.7      Assessment/Plan: Pt is a 1 yowm with 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   Principal Problem:   Pulmonary embolism (HCC) Active Problems:   GOUT   OBSTRUCTIVE SLEEP APNEA   Essential hypertension   Other specified chronic obstructive pulmonary disease   Hypercoagulopathy (HCC)   DM type 2 causing vascular disease (HCC)   Upper GI bleed   Coffee ground emesis   Acute respiratory failure with hypoxia and hypercapnia (HCC)   Shock (HCC)   Acute respiratory failure (HCC)   Enterobacter cloacae pneumonia   Septic shock (HCC)   AKI (acute kidney injury) (HCC)   Encephalopathy acute   Severe sepsis (HCC)  Acute PE in a patient with hypercoagulable state who was noncompliant with his Xarelto for a few days at home. Complicated by PEA arrest.  He has been treated in ICU with Altepase and anticoagulation, required endotracheal intubation and mechanical ventilation for several days, remained intubated for close to 10 days and finally extubated on 05/28/2022.  Also developed hospital/ventilator acquired pneumonia for which she has finished all his antibiotics on 05/29/2022. Is currently on oral Eliquis for his DVT PE. Satting well on RA. Hemodynamically stable and improving.   Myxedema with history of hypothyroidism   Toxic and metabolic encephalopathy Improving. No focal deficits, mentation gradually improving continue to monitor. Likely has early myxedema with TSH in excess of 55,  pt switched to IV Synthroid and monitor.  Will get FT4 level today. Given 150 mcg IV Synthroid, will continue this. Expect improvement in 5-7 days.   AKI, dehydration and hypernatremia.  Seen by nephrology. Overall improving. Intravascularly appears dehydrated, holding lasix. On FWF of 200 cc/q2hrs. Renal function is improving. AKI likely due to shock and is improving. Hypernatremia down-trending at 147 this am. -Trend daily BMP   DM type II.  Placed on Semglee 30 units  and every 4hr resistant sliding scale.  Pt required 75 units of meal coverage yesterday. Monitor and adjust. CBG elevated >200. Will increase his long acting insulin today but will make it BID dosing as pt is improving and may have his NG tube removed and this will prevent hypoglycemia.  -Insulin as above -Monitor sugars closely.   Paroxysmal atrial fibrillation.  Mali vas 2 score of greater than 4.   -On amiodarone and Eliquis combination continue to monitor. Will dc amiodarone given thyroid dysfunction as it can lead to worsening.    Hypertension.   Chronic. Normotensive this am. Placed on lopressor 25 mg BID. CTM.   Anemia of acute/critical illness along with some blood loss initially in ICU due to right groin hematoma at the site of arterial stick.  Stable for now.  Continue to monitor on Eliquis.   COPD and OSA.   Chronic. Supportive care nighttime CPAP.   Morbid obesity.  BMI 43.   Chronic. Follow-up with PCP for weight loss.   Septic shock with Serratia, Enterobacter hospital-acquired pneumonia along with Staph hominis bacteremia.   Resolved. Septic shock treated in ICU, he has finished his IV antibiotics, shock pathophysiology has resolved.  Continue to monitor.   History of gastroparesis.  Monitor on bowel regimen and Reglan.   Diet: NPO IVF: NA VTE: Eliquis Code: Full PT/OT recs: CIR, none. Prior to Admission Living Arrangement:Home Anticipated Discharge Location:CIR Barriers to Discharge:Medical workup   Dispo: Anticipated discharge to CIR in approximately 2 day(s).   Idamae Schuller, MD Tillie Rung. [facility] Internal Medicine Residency, PGY-2  Pager: [phone] After 5 pm and on weekends: Please call the on-call pager    Attestation   I have directly reviewed the clinical findings, lab results and imaging studies. I have interviewed and examined the patient and agree with the documentation and management as recorded by Dr.Khan patient continues to show gradual improvement with IV Synthroid in terms of his myxedema, will request PT OT and speech to reevaluate hopefully he will start some activity and passes swallow screen soon, also was started on amiodarone this admission for paroxysmal atrial fibrillation in ICU which has been stopped, continue beta-blocker for rate control.  Also noted that his TSH was around 50 about 3 years ago, 65 here few days ago.  Gradually trending down.  Continue to monitor.

## 2022-06-03 NOTE — Progress Notes (Signed)
Physical Therapy Treatment Patient Details Name: Frank Shelton. 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  Pt was intubated from 12/30-1/13.  CXR reported small right pleural effusion with probable adjacent right basilar Atelectasis. Significant PMH: PE/DVT, DM, HTN, gout, COPD, OSA.    PT Comments    Pt verbally responsive to PT and OT today, some statements appearing irrelevant to situation like "I know what yall are trying to do" when PT asking pt to perform LAQ at EOB. Pt overall requiring max +2 for bed mobility and sitting EOB x10 minutes, pt with heavy R bias and skewed perception of midline towards R, is resistant to midline and L lateral lean. Pt also appears to have L sided weakness and min inattention, although difficult to fully assess given cognition. PT to continue to follow and progress mobility as able.      Recommendations for follow up therapy are one component of a multi-disciplinary discharge planning process, led by the attending physician.  Recommendations may be updated based on patient status, additional functional criteria and insurance authorization.  Follow Up Recommendations  Acute inpatient rehab (3hours/day)     Assistance Recommended at Discharge Frequent or constant Supervision/Assistance  Patient can return home with the following Two people to help with walking and/or transfers;Two people to help with bathing/dressing/bathroom;Assistance with cooking/housework;Direct supervision/assist for medications management;Direct supervision/assist for financial management;Assist for transportation;Help with stairs or ramp for entrance   Equipment Recommendations  Other (comment)  (defer)    Recommendations for Other Services       Precautions / Restrictions Precautions Precautions: Fall Precaution Comments: cortrak Restrictions Weight Bearing Restrictions: No     Mobility  Bed Mobility Overal bed mobility: Needs Assistance Bed Mobility: Rolling, Supine to Sit, Sit to Supine Rolling: Mod assist, +2 for physical assistance   Supine to sit: Max assist, +2 for physical assistance Sit to supine: Max assist, +2 for physical assistance   General bed mobility comments: assist for trunk and LE management, scooting to/from EOB, repositioning once returned to supine.    Transfers                   General transfer comment: nt    Ambulation/Gait                   Stairs             Wheelchair Mobility    Modified Rankin (Stroke Patients Only)       Balance Overall balance assessment: Needs assistance Sitting-balance support: Feet supported, Bilateral upper extremity supported Sitting balance-Leahy Scale: Poor Sitting balance - Comments: Max A to short periods of Min Guard, heavy R lateral lean and resistant to midline or L lateral lean corrections Postural control: Right lateral lean, Posterior lean                                  Cognition Arousal/Alertness: Awake/alert Behavior During Therapy: Flat affect Overall Cognitive Status: Impaired/Different from baseline Area of Impairment: Attention, Following commands, Awareness  Current Attention Level: Focused   Following Commands: Follows one step commands inconsistently   Awareness: Intellectual Problem Solving: Slow processing, Requires verbal cues General Comments: pt verbalizing this session, mostly making statements like "I know what you're trying to do" but does not seem related to question or command asked of pt        Exercises      General Comments        Pertinent Vitals/Pain Pain Assessment Pain Assessment:  Faces Faces Pain Scale: Hurts even more Pain Location: LLE with movement Pain Descriptors / Indicators: Discomfort, Guarding, Grimacing Pain Intervention(s): Limited activity within patient's tolerance, Monitored during session, Repositioned    Home Living                          Prior Function            PT Goals (current goals can now be found in the care plan section) Acute Rehab PT Goals Patient Stated Goal: get some ice chips PT Goal Formulation: With patient/family Time For Goal Achievement: 06/12/22 Potential to Achieve Goals: Fair Progress towards PT goals: Progressing toward goals    Frequency    Min 3X/week      PT Plan Current plan remains appropriate    Co-evaluation   Reason for Co-Treatment: Complexity of the patient's impairments (multi-system involvement);For patient/therapist safety          AM-PAC PT "6 Clicks" Mobility   Outcome Measure  Help needed turning from your back to your side while in a flat bed without using bedrails?: A Lot Help needed moving from lying on your back to sitting on the side of a flat bed without using bedrails?: Total Help needed moving to and from a bed to a chair (including a wheelchair)?: Total Help needed standing up from a chair using your arms (e.g., wheelchair or bedside chair)?: Total Help needed to walk in hospital room?: Total Help needed climbing 3-5 steps with a railing? : Total 6 Click Score: 7    End of Session Equipment Utilized During Treatment: Oxygen Activity Tolerance: Patient limited by fatigue Patient left: in bed;with call bell/phone within reach;with bed alarm set;with family/visitor present Nurse Communication: Mobility status PT Visit Diagnosis: Other abnormalities of gait and mobility (R26.89);Muscle weakness (generalized) (M62.81)     Time: 0277-4128 PT Time Calculation (min) (ACUTE ONLY): 23 min  Charges:  $Therapeutic Activity: 8-22 mins                     Stacie Glaze, PT  DPT Acute Rehabilitation Services Pager [phone]  Office [phone]    Roxine Caddy E Ruffin Pyo 06/03/2022, 5:17 PM

## 2022-06-04 ENCOUNTER — Inpatient Hospital Stay (HOSPITAL_COMMUNITY): Payer: Medicare HMO

## 2022-06-04 DIAGNOSIS — I2609 Other pulmonary embolism with acute cor pulmonale: Secondary | ICD-10-CM | POA: Diagnosis not present

## 2022-06-04 LAB — GLUCOSE, CAPILLARY
Glucose-Capillary: 199 mg/dL — ABNORMAL HIGH (ref 70–99)
Glucose-Capillary: 229 mg/dL — ABNORMAL HIGH (ref 70–99)
Glucose-Capillary: 233 mg/dL — ABNORMAL HIGH (ref 70–99)
Glucose-Capillary: 243 mg/dL — ABNORMAL HIGH (ref 70–99)
Glucose-Capillary: 245 mg/dL — ABNORMAL HIGH (ref 70–99)
Glucose-Capillary: 259 mg/dL — ABNORMAL HIGH (ref 70–99)
Glucose-Capillary: 316 mg/dL — ABNORMAL HIGH (ref 70–99)

## 2022-06-04 LAB — COMPREHENSIVE METABOLIC PANEL
ALT: 31 U/L (ref 0–44)
AST: 31 U/L (ref 15–41)
Albumin: 2.3 g/dL — ABNORMAL LOW (ref 3.5–5.0)
Alkaline Phosphatase: 53 U/L (ref 38–126)
Anion gap: 8 (ref 5–15)
BUN: 63 mg/dL — ABNORMAL HIGH (ref 8–23)
CO2: 22 mmol/L (ref 22–32)
Calcium: 8.8 mg/dL — ABNORMAL LOW (ref 8.9–10.3)
Chloride: 120 mmol/L — ABNORMAL HIGH (ref 98–111)
Creatinine, Ser: 2.27 mg/dL — ABNORMAL HIGH (ref 0.61–1.24)
GFR, Estimated: 32 mL/min — ABNORMAL LOW (ref >60)
Glucose, Bld: 258 mg/dL — ABNORMAL HIGH (ref 70–99)
Potassium: 3.8 mmol/L (ref 3.5–5.1)
Sodium: 150 mmol/L — ABNORMAL HIGH (ref 135–145)
Total Bilirubin: 0.9 mg/dL (ref 0.3–1.2)
Total Protein: 7 g/dL (ref 6.5–8.1)

## 2022-06-04 LAB — MAGNESIUM: Magnesium: 2.4 mg/dL (ref 1.7–2.4)

## 2022-06-04 LAB — BRAIN NATRIURETIC PEPTIDE: B Natriuretic Peptide: 79.2 pg/mL (ref 0.0–100.0)

## 2022-06-04 LAB — C-REACTIVE PROTEIN: CRP: 1.1 mg/dL — ABNORMAL HIGH (ref <1.0)

## 2022-06-04 LAB — PROCALCITONIN: Procalcitonin: 0.12 ng/mL

## 2022-06-04 LAB — TSH: TSH: 57.289 u[IU]/mL — ABNORMAL HIGH (ref 0.350–4.500)

## 2022-06-04 MED ORDER — INSULIN GLARGINE-YFGN 100 UNIT/ML ~~LOC~~ SOLN
25.0000 [IU] | Freq: Two times a day (BID) | SUBCUTANEOUS | Status: DC
  Administered 2022-06-04 (×2): 25 [IU] via SUBCUTANEOUS
  Filled 2022-06-04 (×4): qty 0.25

## 2022-06-04 MED ORDER — FREE WATER
200.0000 mL | Status: DC
  Administered 2022-06-06: 200 mL

## 2022-06-04 NOTE — Plan of Care (Signed)
  Problem: Coping: Goal: Ability to adjust to condition or change in health will improve Outcome: Progressing   Problem: Fluid Volume: Goal: Ability to maintain a balanced intake and output will improve Outcome: Progressing   Problem: Health Behavior/Discharge Planning: Goal: Ability to identify and utilize available resources and services will improve Outcome: Progressing Goal: Ability to manage health-related needs will improve Outcome: Progressing   Problem: Metabolic: Goal: Ability to maintain appropriate glucose levels will improve Outcome: Progressing   Problem: Nutritional: Goal: Maintenance of adequate nutrition will improve Outcome: Progressing   Problem: Skin Integrity: Goal: Risk for impaired skin integrity will decrease Outcome: Progressing   

## 2022-06-04 NOTE — Progress Notes (Addendum)
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  He had some bleeding from his groin at the site of arterial stick in ICU, he also went into AKI.  He was seen by [facility] and nephrology, he was intubated for several days finally extubated on 05/28/2022, he also developed pneumonia for which she finished on antibiotics on 05/29/2022.  He is currently diffusely weak, has NG tube for feeding. He was transferred to hospitalist service on 05/30/2022, he is still diffusely weak, bedbound with NG tube feed dependence.  Still has severe hypernatremia with improving renal function.   Significant Hospital Events: Jun 14, 2023: Cardiac arrest while in BR. CPR  initiated. TNK administered during CPR, fem cvl  placed/ post arrest shock, high dose pressors. Failed attempt on right femoral and right IJ access. Time to ROSC estimated over 30 minutes. Hgb dropped 12 to 8.8  12/31 on 3 pressors and stress dose steroids. In DKA. Insulin gtt started. LMWH placed on hold.  MRSA PCR screen 14-Jun-2023 > neg  BC x 2 1/1  > one /two pos gpc + staph hominis. Unasyn started, subsequently was switched to meropenem in ICU finished all his antibiotics on 05/29/2022. 1/3 [facility] catheter removed.  Blood cultures repeated.  Repeat echo: EF 50 to 55% no regional wall abnormality grade 1 diastolic dysfunction RV normal Renal function improving with Lasix.  Weaning pressors.  Adding Seroquel and clonazepam to assist with weaning 1/7 ETT exchange, bronch for ongoing hemoptysis 1/10 no hemoptysis; resuming heparin today; off levo 1/11 starting to  wake up a bit, nephro decr lasix and incr FWF   1/12 more mucus, having incr peaks, changing sedation  1/13 finally awake following commands off vasopressors passed SBT.  Trial of extubation. 1/15 transferred to [facility]. 1/16 TSH elevated at 55, started on IV levothyroxine.  1/19 Pro cal at 0.14, CRP down to 1.4, BNP normal. TSH still elevated at 57, FT4 normal  Subjective:  Overnight:NAEON  Slightly confused on exam.  But overall significantly more alert than he states.  Is any chest pain, shortness of breath or any other acute complaints.  Objective:  Vital signs in last 24 hours: Vitals:   06/03/22 1931 06/03/22 2300 06/04/22 0300 06/04/22 0409  BP: 138/71 138/77 (!) 143/75 138/71  Pulse: 99 89 83 99  Resp:  (!) 24 (!) 25   Temp: 98.2 F (36.8 C) 97.9 F (36.6 C) 97.9 F (36.6 C) 98.2 F (36.8 C)  TempSrc: Oral Axillary Axillary Axillary  SpO2: 93% 95% 93% 94%  Weight:      Height:       Supplemental O2: Room air Last BM Date : 06/03/22 SpO2: 94 % O2 Flow Rate (L/min): 6 L/min FiO2 (%): 40 % Filed Weights   05/28/22 0330 05/29/22 0400 05/30/22 0400  Weight: (!) 162 kg (!) 161.4 kg (!) 161 kg    Intake/Output Summary (Last 24 hours) at 06/04/2022 0608 Last data filed at 06/04/2022 0500 Gross per 24 hour  Intake --  Output 3150 ml  Net -3150 ml    Net IO Since Admission: -  8,133.09 mL [06/04/22 0608] Physical Exam  General: NAD, looks alert and more talkative.  HENT: NCAT Lungs:  CTAB Cardiovascular: NSR Abdomen: No TTP MSK: No asymmetry, moving all extremities Skin: no lesions noted on exam Neuro: somnolent but arouses to command, oriented to person, place but not time and situation.   Diagnostics    Latest Ref Rng & Units 05/31/2022    5:31 AM 05/30/2022   12:47 AM 05/29/2022    3:55 AM  CBC  WBC 4.0 - 10.5 K/uL 10.5  11.9  13.2   Hemoglobin 13.0 - 17.0 g/dL 8.8  9.2  8.3   Hematocrit 39.0 - 52.0 % 29.0  30.2  26.6   Platelets 150 - 400 K/uL 243  225  197         Latest Ref Rng & Units 06/03/2022    5:30 AM 06/02/2022    3:10 AM 06/01/2022    8:14 AM  BMP  Glucose 70 - 99 mg/dL 811  914  782   BUN 8 - 23 mg/dL 69  75  80   Creatinine 0.61 - 1.24 mg/dL 9.56  2.13  0.86   Sodium 135 - 145 mmol/L 147  149  154   Potassium 3.5 - 5.1 mmol/L 3.9  4.1  4.4   Chloride 98 - 111 mmol/L 119  120  123   CO2 22 - 32 mmol/L 21  22  22    Calcium 8.9 - 10.3 mg/dL 8.5  8.6  8.7      Assessment/Plan: Pt is a 18 yowm with 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   Principal Problem:   Pulmonary embolism (HCC) Active Problems:   GOUT   OBSTRUCTIVE SLEEP APNEA   Essential hypertension   Other specified chronic obstructive pulmonary disease   Hypercoagulopathy (HCC)   DM type 2 causing vascular disease (HCC)   Upper GI bleed   Coffee ground emesis   Acute respiratory failure with hypoxia and hypercapnia (HCC)   Shock (HCC)   Acute respiratory failure (HCC)   Enterobacter cloacae pneumonia   Septic shock (HCC)   AKI (acute kidney injury) (HCC)   Encephalopathy acute   Severe sepsis (HCC)  Acute PE in a patient with hypercoagulable state who was noncompliant with his Xarelto for a few days at home. Complicated by PEA arrest.  He has been treated in ICU with Altepase and anticoagulation, required endotracheal intubation and mechanical ventilation for several days, remained intubated for close to 10 days and finally extubated on 05/28/2022.  Also developed hospital/ventilator acquired pneumonia for which she has finished all his antibiotics on 05/29/2022. Is currently on oral Eliquis for his DVT PE. Satting well on RA. Hemodynamically stable and improving.   Myxedema with history of hypothyroidism    Toxic and metabolic encephalopathy Improving. No focal deficits, mentation gradually improving continue to monitor. Likely has early myxedema with TSH in excess of 55, pt switched to IV Synthroid and monitor.  Pt on synthroid 200 mcg daily. Expect improvement in coming days. FT4 normalized. Pt may be able to escalate diet given he is more alert from prior but will have SLP evaluate her.   AKI, dehydration and hypernatremia.  Seen by nephrology. Overall  improving. Intravascularly appears dehydrated, holding lasix. Sodium up at 150. Will increase FWF 200 cc continuous. Renal function is improving. AKI likely due to shock and is improving.  -Trend daily BMP   DM type II.  Placed on Semglee 30 units and every 4hr resistant sliding scale.  Pt required 75 units of meal coverage yesterday. Monitor and adjust. CBG elevated >200. Increase insulin to 25 units BID. -Insulin as above -Monitor sugars closely.   Paroxysmal atrial fibrillation.  Mali vas 2 score of greater than 4.   -Pt was on amiodarone and Eliquis combination but Amiodarone dced 01/19. Will continue Eliquis. Pt in NSR.    Hypertension.   Chronic. Normotensive this am. Placed on lopressor 25 mg BID. CTM.   Anemia of acute/critical illness along with some blood loss initially in ICU due to right groin hematoma at the site of arterial stick.  Stable for now.  Continue to monitor on Eliquis.   COPD and OSA.   Chronic. Supportive care nighttime CPAP.   Morbid obesity.  BMI 43.   Chronic. Follow-up with PCP for weight loss.    History of gastroparesis.  Monitor on bowel regimen and Reglan.   Diet: NPO IVF: NA VTE: Eliquis Code: Full PT/OT recs: CIR, none. Prior to Admission Living Arrangement:Home Anticipated Discharge Location:CIR Barriers to Discharge:Medical workup  Dispo: Anticipated discharge to CIR in approximately 2 day(s).   Idamae Schuller, MD Tillie Rung. [facility] Internal Medicine Residency, PGY-2  Pager:  [phone] After 5 pm and on weekends: Please call the on-call pager.   Attestation    I have directly reviewed the clinical findings, lab results and imaging studies. I have interviewed and examined the patient and agree with the documentation and management as recorded by Dr.Khan -continue IV Synthroid for myxedema, now clinically improving, speech therapy following closely hopefully will resume oral diet in the next few days for now continue NG tube feeds along with free water flushes.  CBGs running high as he was getting some D5W that has been stopped will monitor CBGs closely if needed insulin regimen will be adjusted.

## 2022-06-04 NOTE — Progress Notes (Signed)
Inpatient Rehab Admissions Coordinator:   Per therapy recommendations, patient was screened for CIR candidacy by Clemens Catholic, MS, CCC-SLP . At this time, Pt. is not yet participating at a level where I think he could tolerate the intensity of CIR.   Pt. may have potential to progress to becoming a potential CIR candidate, so CIR admissions team will follow and monitor for progress and participation with therapies and place consult order if Pt. appears to be an appropriate candidate. Please contact me with any questions.   Clemens Catholic, [place], [place] Admissions Coordinator  [phone] ([place]) [phone] (office)

## 2022-06-05 DIAGNOSIS — I2609 Other pulmonary embolism with acute cor pulmonale: Secondary | ICD-10-CM | POA: Diagnosis not present

## 2022-06-05 LAB — BRAIN NATRIURETIC PEPTIDE: B Natriuretic Peptide: 67.3 pg/mL (ref 0.0–100.0)

## 2022-06-05 LAB — GLUCOSE, CAPILLARY
Glucose-Capillary: 199 mg/dL — ABNORMAL HIGH (ref 70–99)
Glucose-Capillary: 200 mg/dL — ABNORMAL HIGH (ref 70–99)
Glucose-Capillary: 201 mg/dL — ABNORMAL HIGH (ref 70–99)
Glucose-Capillary: 215 mg/dL — ABNORMAL HIGH (ref 70–99)
Glucose-Capillary: 217 mg/dL — ABNORMAL HIGH (ref 70–99)
Glucose-Capillary: 246 mg/dL — ABNORMAL HIGH (ref 70–99)

## 2022-06-05 LAB — COMPREHENSIVE METABOLIC PANEL
ALT: 32 U/L (ref 0–44)
AST: 32 U/L (ref 15–41)
Albumin: 2.5 g/dL — ABNORMAL LOW (ref 3.5–5.0)
Alkaline Phosphatase: 61 U/L (ref 38–126)
Anion gap: 9 (ref 5–15)
BUN: 60 mg/dL — ABNORMAL HIGH (ref 8–23)
CO2: 18 mmol/L — ABNORMAL LOW (ref 22–32)
Calcium: 8.7 mg/dL — ABNORMAL LOW (ref 8.9–10.3)
Chloride: 121 mmol/L — ABNORMAL HIGH (ref 98–111)
Creatinine, Ser: 2.1 mg/dL — ABNORMAL HIGH (ref 0.61–1.24)
GFR, Estimated: 35 mL/min — ABNORMAL LOW (ref >60)
Glucose, Bld: 212 mg/dL — ABNORMAL HIGH (ref 70–99)
Potassium: 4.2 mmol/L (ref 3.5–5.1)
Sodium: 148 mmol/L — ABNORMAL HIGH (ref 135–145)
Total Bilirubin: 0.9 mg/dL (ref 0.3–1.2)
Total Protein: 6.9 g/dL (ref 6.5–8.1)

## 2022-06-05 LAB — TSH: TSH: 51.766 u[IU]/mL — ABNORMAL HIGH (ref 0.350–4.500)

## 2022-06-05 LAB — PROCALCITONIN: Procalcitonin: <0.1 ng/mL

## 2022-06-05 LAB — C-REACTIVE PROTEIN: CRP: 1 mg/dL — ABNORMAL HIGH (ref <1.0)

## 2022-06-05 LAB — MAGNESIUM: Magnesium: 2.3 mg/dL (ref 1.7–2.4)

## 2022-06-05 MED ORDER — INSULIN GLARGINE-YFGN 100 UNIT/ML ~~LOC~~ SOLN
28.0000 [IU] | Freq: Two times a day (BID) | SUBCUTANEOUS | Status: DC
  Administered 2022-06-05 – 2022-06-06 (×3): 28 [IU] via SUBCUTANEOUS
  Filled 2022-06-05 (×4): qty 0.28

## 2022-06-05 NOTE — Progress Notes (Addendum)
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  He had some bleeding from his groin at the site of arterial stick in ICU, he also went into AKI.  He was seen by [facility] and nephrology, he was intubated for several days finally extubated on 05/28/2022, he also developed pneumonia for which she finished on antibiotics on 05/29/2022.  He is currently diffusely weak, has NG tube for feeding. He was transferred to hospitalist service on 05/30/2022, he is still diffusely weak, bedbound with NG tube feed dependence.  Still has severe hypernatremia with improving renal function.   Significant Hospital Events: 05-29-23: Cardiac arrest while in BR. CPR  initiated. TNK administered during CPR, fem cvl  placed/ post arrest shock, high dose pressors. Failed attempt on right femoral and right IJ access. Time to ROSC estimated over 30 minutes. Hgb dropped 12 to 8.8  12/31 on 3 pressors and stress dose steroids. In DKA. Insulin gtt started. LMWH placed on hold.  MRSA PCR screen 05/29/23 > neg  BC x 2 1/1  > one /two pos gpc + staph hominis. Unasyn started, subsequently was switched to meropenem in ICU finished all his antibiotics on 05/29/2022. 1/3 transferred to [facility] central venous catheter removed.  Blood cultures repeated.  Repeat echo: EF 50 to 55% no regional wall abnormality grade 1 diastolic dysfunction RV normal Renal function improving with Lasix.  Weaning pressors.  Adding Seroquel and clonazepam to assist with weaning June 05, 2022 ETT exchange, bronch for ongoing hemoptysis 1/10 no hemoptysis; resuming heparin today; off levo 1/11 starting to  wake up a bit, nephro decr lasix and incr FWF   1/12 more mucus, having incr peaks, changing sedation  1/13 finally awake following commands off vasopressors passed SBT.  Trial of extubation. 1/15 transferred to [facility]. 1/16 TSH elevated at 55, started on IV levothyroxine.  1/19 Pro cal at 0.14, CRP down to 1.4, BNP normal. TSH still elevated at 57, FT4 normal  Subjective:  Overnight:NAEON  States doing well. No acute concerns endorsed. Alert but oriented to self only. States he wants to see his mother and sons.   Objective:  Vital signs in last 24 hours: Vitals:   06/04/22 2000 06/04/22 2052 06/04/22 2313 06/05/22 0300  BP: 138/72 132/74 135/83 122/77  Pulse: 89 90 84 84  Resp: 19 20 (!) 23 20  Temp:   98 F (36.7 C) 98.1 F (36.7 C)  TempSrc:   Axillary Oral  SpO2: 97%  95% 97%  Weight:      Height:       Supplemental O2: Room air Last BM Date : 06/04/22 SpO2: 97 % O2 Flow Rate (L/min): 6 L/min FiO2 (%): 40 % Filed Weights   05/28/22 0330 05/29/22 0400 05/30/22 0400  Weight: (!) 162 kg (!) 161.4 kg (!) 161 kg    Intake/Output Summary (Last 24 hours) at 06/05/2022 0609 Last data filed at 06/05/2022 0400 Gross per 24 hour  Intake 4620 ml  Output 3800 ml  Net 820 ml    Net IO Since Admission: -7,313.09 mL [06/05/22 0609] Physical Exam  General: NAD, looks  alert and more talkative.  HENT: NCAT Lungs:  CTAB Cardiovascular: NSR Abdomen: No TTP MSK: No asymmetry, moving all extremities Skin: no lesions noted on exam Neuro: fully alert but oriented to self only, states he is at [employer] place when asked about the place, he was watching news about joe Biden at the time.  Diagnostics    Latest Ref Rng & Units 05/31/2022    5:31 AM 05/30/2022   12:47 AM 05/29/2022    3:55 AM  CBC  WBC 4.0 - 10.5 K/uL 10.5  11.9  13.2   Hemoglobin 13.0 - 17.0 g/dL 8.8  9.2  8.3   Hematocrit 39.0 - 52.0 % 29.0  30.2  26.6   Platelets 150 - 400 K/uL 243  225  197        Latest Ref  Rng & Units 06/05/2022    3:02 AM 06/04/2022    4:21 AM 06/03/2022    5:30 AM  BMP  Glucose 70 - 99 mg/dL 212  258  300   BUN 8 - 23 mg/dL 60  63  69   Creatinine 0.61 - 1.24 mg/dL 2.10  2.27  2.39   Sodium 135 - 145 mmol/L 148  150  147   Potassium 3.5 - 5.1 mmol/L 4.2  3.8  3.9   Chloride 98 - 111 mmol/L 121  120  119   CO2 22 - 32 mmol/L 18  22  21    Calcium 8.9 - 10.3 mg/dL 8.7  8.8  8.5     Mag 2.3 TSH 51.8 CXR 01/20 Shows mild stable cardiomegaly with suggestion of minimal vascular congestion.  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   Principal Problem:   Pulmonary embolism (HCC) Active Problems:   GOUT   OBSTRUCTIVE SLEEP APNEA   Essential hypertension   Other specified chronic obstructive pulmonary disease   Hypercoagulopathy (HCC)   DM type 2 causing vascular disease (HCC)   Upper GI bleed   Coffee ground emesis   Acute respiratory failure with hypoxia and hypercapnia (HCC)   Shock ([place])   Acute respiratory failure ([place])   Enterobacter cloacae pneumonia   Septic shock (HCC)   AKI (acute kidney injury) ([place])   Encephalopathy acute   Severe sepsis ([place])  Acute PE in a patient with hypercoagulable state who was noncompliant with his Xarelto for a few days at home. Complicated by PEA arrest.  He has been treated in ICU with Altepase and anticoagulation, required endotracheal intubation and mechanical ventilation for several days, remained intubated for close to 10 days and finally extubated on 05/28/2022.  Also developed hospital/ventilator acquired pneumonia for which she has finished all his antibiotics on 05/29/2022. Is currently on oral Eliquis for his DVT PE. Satting well  on RA. Hemodynamically stable and improving.   Myxedema with history of hypothyroidism   Toxic and metabolic encephalopathy Improving. No focal deficits, mentation improving continue to monitor. Pt with early myxedema with TSH in excess of 55, pt switched to IV synthroid 200 mcg daily. Expect improvement in coming days. FT4 normalized. Pt may be able to escalate diet given he is more alert from prior but will have SLP  evaluate him.   AKI, dehydration and hypernatremia.  Seen by nephrology. Overall improving. Intravascularly appears dehydrated, holding lasix. Sodium downtrending. Will increase FWF 200 cc continuous. Renal function is improving. AKI likely due to shock and is improving.  -Trend daily BMP   DM type II.  Placed on Semglee 30 units and every 4hr resistant sliding scale.  Pt required 75 units of meal coverage yesterday. Monitor and adjust. CBG elevated >200. Increase insulin to 28 units BID. -Insulin as above -Monitor sugars closely.   CBG (last 3)  Recent Labs    06/04/22 2309 06/05/22 0327 06/05/22 0811  GLUCAP 259* 201* 215*    Paroxysmal atrial fibrillation.  Mali vas 2 score of greater than 4.   -Pt was on amiodarone and Eliquis combination but Amiodarone dced 01/19. Will continue Eliquis. Pt in NSR.    Hypertension.   Chronic. Normotensive this am. Placed on lopressor 25 mg BID. CTM.   Anemia of acute/critical illness along with some blood loss initially in ICU due to right groin hematoma at the site of arterial stick.  Stable for now.  Continue to monitor on Eliquis.   COPD and OSA.   Chronic. Supportive care nighttime CPAP.   Morbid obesity.  BMI 43.   Chronic. Follow-up with PCP for weight loss.   History of gastroparesis.  Monitor on bowel regimen and Reglan.   Diet: NPO IVF: NA VTE: Eliquis Code: Full PT/OT recs: CIR, none. Prior to Admission Living Arrangement:Home Anticipated Discharge Location:CIR Barriers to Discharge:Medical workup  Dispo:  Anticipated discharge to CIR in approximately 2 day(s).   Idamae Schuller, MD Tillie Rung. [facility] Internal Medicine Residency, PGY-2  Pager: [phone] After 5 pm and on weekends: Please call the on-call pager.   Attestation.   I have directly reviewed the clinical findings, lab results and imaging studies. I have interviewed and examined the patient and agree with the documentation and management as recorded by Dr.Khan, continues to be on IV Synthroid for myxedema, now gradually improving, continue PT OT and speech eval hopefully will come off of NG tube feeds soon.  Continue free water continuous flush via NG tube with improving hypernatremia.  Insulin regimen titrated for better glycemic control.

## 2022-06-06 DIAGNOSIS — I2609 Other pulmonary embolism with acute cor pulmonale: Secondary | ICD-10-CM | POA: Diagnosis not present

## 2022-06-06 LAB — COMPREHENSIVE METABOLIC PANEL
ALT: 34 U/L (ref 0–44)
AST: 35 U/L (ref 15–41)
Albumin: 2.5 g/dL — ABNORMAL LOW (ref 3.5–5.0)
Alkaline Phosphatase: 64 U/L (ref 38–126)
Anion gap: 9 (ref 5–15)
BUN: 55 mg/dL — ABNORMAL HIGH (ref 8–23)
CO2: 18 mmol/L — ABNORMAL LOW (ref 22–32)
Calcium: 8.5 mg/dL — ABNORMAL LOW (ref 8.9–10.3)
Chloride: 118 mmol/L — ABNORMAL HIGH (ref 98–111)
Creatinine, Ser: 1.93 mg/dL — ABNORMAL HIGH (ref 0.61–1.24)
GFR, Estimated: 38 mL/min — ABNORMAL LOW (ref >60)
Glucose, Bld: 273 mg/dL — ABNORMAL HIGH (ref 70–99)
Potassium: 3.8 mmol/L (ref 3.5–5.1)
Sodium: 145 mmol/L (ref 135–145)
Total Bilirubin: 0.9 mg/dL (ref 0.3–1.2)
Total Protein: 6.5 g/dL (ref 6.5–8.1)

## 2022-06-06 LAB — GLUCOSE, CAPILLARY
Glucose-Capillary: 211 mg/dL — ABNORMAL HIGH (ref 70–99)
Glucose-Capillary: 286 mg/dL — ABNORMAL HIGH (ref 70–99)
Glucose-Capillary: 280 mg/dL — ABNORMAL HIGH (ref 70–99)
Glucose-Capillary: 213 mg/dL — ABNORMAL HIGH (ref 70–99)
Glucose-Capillary: 155 mg/dL — ABNORMAL HIGH (ref 70–99)

## 2022-06-06 LAB — BRAIN NATRIURETIC PEPTIDE: B Natriuretic Peptide: 54.5 pg/mL (ref 0.0–100.0)

## 2022-06-06 LAB — PROCALCITONIN: Procalcitonin: <0.1 ng/mL

## 2022-06-06 LAB — C-REACTIVE PROTEIN: CRP: 0.8 mg/dL (ref <1.0)

## 2022-06-06 LAB — MAGNESIUM: Magnesium: 2.1 mg/dL (ref 1.7–2.4)

## 2022-06-06 MED ORDER — INSULIN ASPART 100 UNIT/ML IJ SOLN
0.0000 [IU] | Freq: Three times a day (TID) | INTRAMUSCULAR | Status: DC
  Administered 2022-06-06 – 2022-06-07 (×4): 5 [IU] via SUBCUTANEOUS
  Administered 2022-06-08 (×2): 3 [IU] via SUBCUTANEOUS
  Administered 2022-06-08 – 2022-06-09 (×3): 2 [IU] via SUBCUTANEOUS
  Administered 2022-06-09 – 2022-06-10 (×2): 3 [IU] via SUBCUTANEOUS
  Administered 2022-06-10: 2 [IU] via SUBCUTANEOUS
  Administered 2022-06-11: 3 [IU] via SUBCUTANEOUS
  Administered 2022-06-11 – 2022-06-12 (×4): 2 [IU] via SUBCUTANEOUS
  Administered 2022-06-12: 3 [IU] via SUBCUTANEOUS
  Administered 2022-06-13 (×2): 2 [IU] via SUBCUTANEOUS
  Administered 2022-06-13: 3 [IU] via SUBCUTANEOUS
  Administered 2022-06-14 (×2): 2 [IU] via SUBCUTANEOUS

## 2022-06-06 MED ORDER — INSULIN GLARGINE-YFGN 100 UNIT/ML ~~LOC~~ SOLN
28.0000 [IU] | Freq: Every day | SUBCUTANEOUS | Status: DC
  Administered 2022-06-07 – 2022-06-14 (×8): 28 [IU] via SUBCUTANEOUS
  Filled 2022-06-06 (×8): qty 0.28

## 2022-06-06 NOTE — Progress Notes (Signed)
   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  Pt was intubated from 12/30-1/13.  CXR reported small right pleural effusion with probable adjacent right basilar  Atelectasis.  Treatment Provided  Treatment provided Dysphagia   Pt sitting in recliner after session with PT. Alert, confused.  Demonstrating ongoing coughing with thin and nectar thick trials.  Needed verbal cues to slow down and allow physical assist from therapist. He will benefit from FEES today.  Consent form was signed by his mother this weekend. Will proceed this afternoon.    Dysphagia Treatment  Temperature Spikes Noted No  Respiratory Status Room air  Oral Cavity - Dentition Adequate natural dentition  Treatment Methods Upgraded PO texture trial;Skilled observation  Family/Caregiver Educated pt  Patient observed directly with PO's Yes  Type of PO's observed Thin liquids;Dysphagia 1 (puree);Nectar-thick liquids  Feeding Needed assist  Pharyngeal Phase Signs & Symptoms Immediate cough  Type of cueing Verbal  Amount of cueing Minimal  Liquids provided via Cup;Straw  Pain Assessment  Pain Assessment No/denies pain  SLP - End of Session  Patient left in chair;with call bell/phone within reach;with chair alarm set  Assessment / Recommendations / Plan  Plan Other (Comment) (FEES)  Dysphagia Recommendations  Diet recommendations NPO  Liquids provided via Cup  Medication Administration Via alternative means  General Recommendations  Oral Care Recommendations Oral care QID  Assistance recommended at discharge Frequent or constant  Supervision/Assistance  SLP Visit Diagnosis Dysphagia, oropharyngeal phase (R13.12)  Progression Toward Goals  Progression toward goals Progressing toward goals  SLP Time Calculation  SLP Start Time (ACUTE ONLY) 1011  SLP Stop Time (ACUTE ONLY) 1023  SLP Time Calculation (min) (ACUTE ONLY) 12 min  SLP Evaluations  $ SLP Speech Visit 1 Visit  SLP Evaluations  $Swallowing Treatment 1 Procedure   Regine Christian L. Tivis Ringer, MA CCC/SLP Clinical Specialist - [place] Office number [phone]

## 2022-06-06 NOTE — Progress Notes (Signed)
Speech Language Pathology Treatment: Dysphagia  Patient Details Name: Frank Shelton. MRN: [id number] DOB: 07/04/1960 Today's Date: 06/06/2022 Time: 2637-8588 SLP Time Calculation (min) (ACUTE ONLY): 28 min  Assessment / Plan / Recommendation Clinical Impression  FEES attempted at bedside after pt was transferred back to bed. Parents at bedside.  Scope passed x2 but camera clouded and unable to be cleared in order to visualize pharynx and larynx.  Study was aborted and clinician assessed further at based on clinical performance alone.  Pt eager to eat. With assist, he ate several bites of applesauce and consumed 4 oz of honey-thick liquids with no overt s/s of aspiration. His voice is much improved with regard to volume and quality. There was no coughing, no wet phonation or congestion noted after swallowing.  Recommend starting a dysphagia 1 diet with honey-thick liquids; crush meds.  Please provide full supervision during meals to ensure safety. SLP will follow for diet advancement/safety/education. Secure chatted Dr. Candiss Shelton and pt's RN re: plan.      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  Pt was intubated from 12/30-1/13.  CXR reported small right pleural effusion with probable adjacent right basilar  Atelectasis.      SLP Plan  Continue with current plan of care      Recommendations for follow up therapy are one component of a multi-disciplinary discharge planning process, led by the attending physician.  Recommendations may be updated based on patient status, additional functional criteria and insurance authorization.    Recommendations  Diet recommendations: Dysphagia 1 (puree);Honey-thick liquid Liquids provided via: Cup;Straw Medication Administration: Crushed with  puree Supervision: Staff to assist with self feeding Compensations: Minimize environmental distractions;Slow rate;Small sips/bites                Oral Care Recommendations: Oral care BID Follow Up Recommendations: Skilled nursing-short term rehab (<3 hours/day) Assistance recommended at discharge: Frequent or constant Supervision/Assistance SLP Visit Diagnosis: Dysphagia, oropharyngeal phase (R13.12) Plan: Continue with current plan of care         Frank Shelton. Frank Ringer, MA CCC/SLP Clinical Specialist - Acute Care SLP Acute Rehabilitation Services Office number [phone]   Frank Shelton  06/06/2022, 3:25 PM

## 2022-06-06 NOTE — Progress Notes (Signed)
Physical Therapy Treatment Patient Details Name: Frank Shelton. 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  Pt was intubated from 12/30-1/13.  CXR reported small right pleural effusion with probable adjacent right basilar Atelectasis. Significant PMH: PE/DVT, DM, HTN, gout, COPD, OSA.    PT Comments    Continuing work on functional mobility and activity tolerance;  focus of session was on initial use of Maximove for safe OOB to recliner transfers; tolerated well; Will continue functional mobility efforts  Recommendations for follow up therapy are one component of a multi-disciplinary discharge planning process, led by the attending physician.  Recommendations may be updated based on patient status, additional functional criteria and insurance authorization.  Follow Up Recommendations  Acute inpatient rehab (3hours/day)     Assistance Recommended at Discharge Frequent or constant Supervision/Assistance  Patient can return home with the following Two people to help with walking and/or transfers;Two people to help with bathing/dressing/bathroom;Assistance with cooking/housework;Direct supervision/assist for medications management;Direct supervision/assist for financial management;Assist for transportation;Help with stairs or ramp for entrance   Equipment Recommendations  Other (comment) (defer)    Recommendations for Other Services Rehab consult;OT consult     Precautions / Restrictions Precautions Precautions: Fall Precaution Comments: cortrak Restrictions Weight Bearing Restrictions: No     Mobility  Bed Mobility Overal bed mobility: Needs Assistance Bed Mobility: Rolling Rolling: Mod assist, +2  for physical assistance         General bed mobility comments: Heavy mod assist of 2 to roll; shows good effort to reach across for bedrail with cues and incr time; heavy mod assist to lift LEs up and over to roll; used rolling to place lift pad    Transfers Overall transfer level: Needs assistance Equipment used: Ambulation equipment used Transfers: Bed to chair/wheelchair/BSC             General transfer comment: Maximove for safe transfer bed to recliner Transfer via Lift Equipment: Maximove  Ambulation/Gait                   Stairs             Wheelchair Mobility    Modified Rankin (Stroke Patients Only)       Balance                                            Cognition Arousal/Alertness: Awake/alert Behavior During Therapy: Flat affect Overall Cognitive Status: Impaired/Different from baseline Area of Impairment: Attention, Following commands, Awareness                   Current Attention Level: Sustained Memory: Decreased short-term memory Following Commands: Follows one step commands with increased time Safety/Judgement: Decreased awareness of safety, Decreased awareness of deficits   Problem Solving: Slow processing, Requires verbal cues          Exercises      General Comments General comments (skin integrity, edema, etc.): VSS on room air      Pertinent Vitals/Pain Pain Assessment Pain Assessment: No/denies pain    Home Living  Prior Function            PT Goals (current goals can now be found in the care plan section) Acute Rehab PT Goals Patient Stated Goal: Did not state, but agrees to OOB with lift PT Goal Formulation: With patient/family Time For Goal Achievement: 06/12/22 Potential to Achieve Goals: Fair Progress towards PT goals: Progressing toward goals    Frequency    Min 3X/week      PT Plan Current plan remains appropriate     Co-evaluation              AM-PAC PT "6 Clicks" Mobility   Outcome Measure  Help needed turning from your back to your side while in a flat bed without using bedrails?: A Lot Help needed moving from lying on your back to sitting on the side of a flat bed without using bedrails?: Total Help needed moving to and from a bed to a chair (including a wheelchair)?: Total Help needed standing up from a chair using your arms (e.g., wheelchair or bedside chair)?: Total Help needed to walk in hospital room?: Total Help needed climbing 3-5 steps with a railing? : Total 6 Click Score: 7    End of Session Equipment Utilized During Treatment: Other (comment) (lift) Activity Tolerance: Patient tolerated treatment well Patient left: in chair;with call bell/phone within reach;with chair alarm set Nurse Communication: Mobility status PT Visit Diagnosis: Other abnormalities of gait and mobility (R26.89);Muscle weakness (generalized) (M62.81)     Time: 2025-4270 PT Time Calculation (min) (ACUTE ONLY): 32 min  Charges:  $Therapeutic Activity: 23-37 mins                     Roney Marion, PT  Acute Rehabilitation Services Office [phone]    [place]letta [place] 06/06/2022, 4:57 PM

## 2022-06-06 NOTE — Progress Notes (Signed)
Speech Pathology:   FEES was attempted but due to mechanical issues the pharynx/larynx  were unable to be viewed.  Pt's swallowing was re-assessed at bedside and decision was made to start patient on dysphagia 1 diet with honey-thick liquid based on his clinical presentation. Full report to follow.  Diet orders written.  Yahmir Sokolov L. Tivis Ringer, MA CCC/SLP Clinical Specialist - [place] Office number [phone]

## 2022-06-06 NOTE — Progress Notes (Addendum)
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  He had some bleeding from his groin at the site of arterial stick in ICU, he also went into AKI.  He was seen by [facility] and nephrology, he was intubated for several days finally extubated on 05/28/2022, he also developed pneumonia for which she finished on antibiotics on 05/29/2022.  He is currently diffusely weak, has NG tube for feeding. He was transferred to hospitalist service on 05/30/2022, he is still diffusely weak, bedbound with NG tube feed dependence.  Still has severe hypernatremia with improving renal function.   Significant Hospital Events: 05-18-2023: Cardiac arrest while in BR. CPR  initiated. TNK administered during CPR, fem cvl  placed/ post arrest shock, high dose pressors. Failed attempt on right femoral and right IJ access. Time to ROSC estimated over 30 minutes. Hgb dropped 12 to 8.8  12/31 on 3 pressors and stress dose steroids. In DKA. Insulin gtt started. LMWH placed on hold.  MRSA PCR screen 2023/05/18 > neg  BC x 2 1/1  > one /two pos gpc + staph hominis. Unasyn started, subsequently was switched to meropenem in ICU finished all his antibiotics on 05/29/2022. 1/3 transferred to [facility] central venous catheter removed.  Blood cultures repeated.  Repeat echo: EF 50 to 55% no regional wall abnormality grade 1 diastolic dysfunction RV normal Renal function improving with Lasix.  Weaning pressors.  Adding Seroquel and clonazepam to assist with weaning 1/7 ETT exchange, bronch for ongoing hemoptysis 1/10 no hemoptysis; resuming heparin today; off levo 1/11 starting to  wake up a bit, nephro decr lasix and incr FWF   1/12 more mucus, having incr peaks, changing sedation  1/13 finally awake following commands off vasopressors passed SBT.  Trial of extubation. 1/15 transferred to [facility]. 1/16 TSH elevated at 55, started on IV levothyroxine.  1/19 Pro cal at 0.14, CRP down to 1.4, BNP normal. TSH still elevated at 57, FT4 normal  Subjective:  Overnight:NAEON  Alert and more oriented this am. No chest pain, shortness of breath or other acute concerns.   Objective:  Vital signs in last 24 hours: Vitals:   06/06/22 0300 06/06/22 0337 06/06/22 0400 06/06/22 0500  BP:  112/74 124/77   Pulse:  85    Resp: (!) 26 (!) 21 (!) 29 20  Temp:  98 F (36.7 C)    TempSrc:  Oral    SpO2:      Weight:      Height:       Supplemental O2: Room air Last BM Date : 06/06/22 SpO2: 93 % O2 Flow Rate (L/min): 6 L/min FiO2 (%): 40 % Filed Weights   05/28/22 0330 05/29/22 0400 05/30/22 0400  Weight: (!) 162 kg (!) 161.4 kg (!) 161 kg    Intake/Output Summary (Last 24 hours) at 06/06/2022 3557 Last data filed at 06/06/2022 0343 Gross per 24 hour  Intake 180 ml  Output 2850 ml  Net -2670 ml   Net IO Since Admission: -9,983.09 mL [06/06/22 0628] Physical Exam  General: NAD, looks alert and more talkative.  HENT: NCAT Lungs:  CTAB Cardiovascular: NSR Abdomen: No TTP MSK: No asymmetry, moving all extremities Skin: no lesions noted on exam Neuro: fully alert but oriented person, place but not time Diagnostics    Latest Ref Rng & Units 05/31/2022    5:31 AM 05/30/2022   12:47 AM 05/29/2022    3:55 AM  CBC  WBC 4.0 - 10.5 K/uL 10.5  11.9  13.2   Hemoglobin 13.0 - 17.0 g/dL 8.8  9.2  8.3   Hematocrit 39.0 - 52.0 % 29.0  30.2  26.6   Platelets 150 - 400 K/uL 243  225  197        Latest Ref Rng & Units 06/06/2022    2:37 AM 06/05/2022    3:02 AM 06/04/2022    4:21 AM  BMP  Glucose 70 - 99 mg/dL 273  212  258   BUN 8 - 23 mg/dL 55  60  63   Creatinine 0.61 -  1.24 mg/dL 1.93  2.10  2.27   Sodium 135 - 145 mmol/L 145  148  150   Potassium 3.5 - 5.1 mmol/L 3.8  4.2  3.8   Chloride 98 - 111 mmol/L 118  121  120   CO2 22 - 32 mmol/L 18  18  22    Calcium 8.9 - 10.3 mg/dL 8.5  8.7  8.8     Pro-calcitonin <0.10 Mag 2.1 TSH 51.8 CXR 01/20 Shows mild stable cardiomegaly with suggestion of minimal vascular congestion.  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   Principal Problem:   Pulmonary embolism (HCC) Active Problems:   GOUT   OBSTRUCTIVE SLEEP APNEA   Essential hypertension   Other specified chronic obstructive pulmonary disease   Hypercoagulopathy (HCC)   DM type 2 causing vascular disease (HCC)   Upper GI bleed   Coffee ground emesis   Acute respiratory failure with hypoxia and hypercapnia (HCC)   Shock ([place])   Acute respiratory failure ([place])   Enterobacter cloacae pneumonia   Septic shock (HCC)   AKI (acute kidney injury) ([place])   Encephalopathy acute   Severe sepsis ([place])  Acute PE in a patient with hypercoagulable state who was noncompliant with his Xarelto for a few days at home. Complicated by PEA arrest.  He has been treated in ICU with Altepase and anticoagulation, required endotracheal intubation and mechanical ventilation for several days, remained intubated for close to 10 days and finally extubated on 05/28/2022.  Also developed hospital/ventilator acquired pneumonia for which she has finished all his antibiotics on 05/29/2022. Is currently on oral Eliquis for his DVT PE. Satting well on RA. Hemodynamically stable and improving.   Myxedema with history of hypothyroidism   Toxic and metabolic encephalopathy Improving. No focal  deficits, mentation improving continue to monitor. Pt with early myxedema with TSH in excess of 55, pt switched to IV synthroid 200 mcg daily. Seeing significant improvement in pt with IV synthroid. FT4 normalized. Pt may be able to escalate diet given he is more alert from prior but will have SLP evaluate him today.   AKI, dehydration and hypernatremia.  Seen by nephrology. Overall improving. Intravascularly appears dehydrated, holding lasix. Sodium normalized. Will continue continuous  FWF 200 cc. Renal function is improving. AKI likely due to shock and is improving.  -Trend daily BMP   DM type II.  Was on basal insulin and meal coverage every 4hr resistant sliding scale. Monitor and adjust. CBG elevated >200. Insulin at 28 units BID with 4 hr meal coverage. -Insulin as above -Monitor sugars closely.   CBG (last 3)  Recent Labs    06/05/22 2009 06/05/22 2337 06/06/22 0340  GLUCAP 217* 199* 211*    Paroxysmal atrial fibrillation.  [place] vas 2 score of greater than 4.   -Pt was on amiodarone and Eliquis combination but Amiodarone dced 01/19. Will continue Eliquis. Pt in NSR.    Hypertension.   Chronic. Normotensive this am. Placed on lopressor 25 mg BID. CTM.    Anemia of acute/critical illness along with some blood loss initially in ICU due to right groin hematoma at the site of arterial stick.  Stable for now.  Continue to monitor on Eliquis.   COPD and OSA.   Chronic. Supportive care nighttime CPAP.   Morbid obesity.  BMI 43.   Chronic. Follow-up with PCP for weight loss.   History of gastroparesis.  Monitor on bowel regimen and Reglan.   Diet: NPO IVF: NA VTE: Eliquis Code: Full PT/OT recs: CIR, none. Prior to Admission Living Arrangement:Home Anticipated Discharge Location:CIR Barriers to Discharge:Medical workup  Dispo: Anticipated discharge to CIR in approximately 1-2 day(s).   Gwenevere Abbot, MD Eligha Bridegroom. [facility] Internal Medicine Residency, PGY-2   Pager: [phone] After 5 pm and on weekends: Please call the on-call pager.   Attestation -   I have directly reviewed the clinical findings, lab results and imaging studies. I have interviewed and examined the patient and agree with the documentation and management as recorded by the Dr Welton Flakes, admitted for myxedema AKI, hypernatremia due to dehydration along with acute PE causing PEA.  Much improved, getting IV Synthroid, generalized weakness and deconditioning improving, speech has improved, await speech to clear him for oral diet till then continue NG feeds.

## 2022-06-06 NOTE — Plan of Care (Signed)
  Problem: Education: Goal: Ability to describe self-care measures that may prevent or decrease complications (Diabetes Survival Skills Education) will improve Outcome: Progressing Goal: Individualized Educational Video(s) Outcome: Progressing   Problem: Coping: Goal: Ability to adjust to condition or change in health will improve Outcome: Progressing   Problem: Fluid Volume: Goal: Ability to maintain a balanced intake and output will improve Outcome: Progressing   Problem: Health Behavior/Discharge Planning: Goal: Ability to identify and utilize available resources and services will improve Outcome: Progressing Goal: Ability to manage health-related needs will improve Outcome: Progressing   Problem: Metabolic: Goal: Ability to maintain appropriate glucose levels will improve Outcome: Progressing   Problem: Nutritional: Goal: Maintenance of adequate nutrition will improve Outcome: Progressing Goal: Progress toward achieving an optimal weight will improve Outcome: Progressing   Problem: Skin Integrity: Goal: Risk for impaired skin integrity will decrease Outcome: Progressing   Problem: Tissue Perfusion: Goal: Adequacy of tissue perfusion will improve Outcome: Progressing   Problem: Education: Goal: Knowledge of General Education information will improve Description: Including pain rating scale, medication(s)/side effects and non-pharmacologic comfort measures Outcome: Progressing   Problem: Health Behavior/Discharge Planning: Goal: Ability to manage health-related needs will improve Outcome: Progressing   Problem: Clinical Measurements: Goal: Ability to maintain clinical measurements within normal limits will improve Outcome: Progressing Goal: Will remain free from infection Outcome: Progressing Goal: Diagnostic test results will improve Outcome: Progressing Goal: Respiratory complications will improve Outcome: Progressing Goal: Cardiovascular complication will  be avoided Outcome: Progressing   Problem: Activity: Goal: Risk for activity intolerance will decrease Outcome: Progressing   Problem: Nutrition: Goal: Adequate nutrition will be maintained Outcome: Progressing   Problem: Coping: Goal: Level of anxiety will decrease Outcome: Progressing   Problem: Elimination: Goal: Will not experience complications related to bowel motility Outcome: Progressing Goal: Will not experience complications related to urinary retention Outcome: Progressing   Problem: Pain Managment: Goal: General experience of comfort will improve Outcome: Progressing   Problem: Safety: Goal: Ability to remain free from injury will improve Outcome: Progressing   Problem: Skin Integrity: Goal: Risk for impaired skin integrity will decrease Outcome: Progressing   Problem: Safety: Goal: Non-violent Restraint(s) Outcome: Progressing   Problem: Education: Goal: Ability to describe self-care measures that may prevent or decrease complications (Diabetes Survival Skills Education) will improve Outcome: Progressing Goal: Individualized Educational Video(s) Outcome: Progressing   Problem: Cardiac: Goal: Ability to maintain an adequate cardiac output will improve Outcome: Progressing   Problem: Health Behavior/Discharge Planning: Goal: Ability to identify and utilize available resources and services will improve Outcome: Progressing Goal: Ability to manage health-related needs will improve Outcome: Progressing   Problem: Fluid Volume: Goal: Ability to achieve a balanced intake and output will improve Outcome: Progressing   Problem: Metabolic: Goal: Ability to maintain appropriate glucose levels will improve Outcome: Progressing   Problem: Nutritional: Goal: Maintenance of adequate nutrition will improve Outcome: Progressing Goal: Maintenance of adequate weight for body size and type will improve Outcome: Progressing   Problem: Respiratory: Goal:  Will regain and/or maintain adequate ventilation Outcome: Progressing   Problem: Urinary Elimination: Goal: Ability to achieve and maintain adequate renal perfusion and functioning will improve Outcome: Progressing

## 2022-06-06 NOTE — Inpatient Diabetes Management (Signed)
Inpatient Diabetes Program Recommendations  AACE/ADA: New Consensus Statement on Inpatient Glycemic Control (2015)  Target Ranges:  Prepandial:   less than 140 mg/dL      Peak postprandial:   less than 180 mg/dL (1-2 hours)      Critically ill patients:  140 - 180 mg/dL   Lab Results  Component Value Date   GLUCAP 286 (H) 06/06/2022   HGBA1C 11.4 (H) 05/12/2022    Review of Glycemic Control  Latest Reference Range & Units 06/05/22 08:11 06/05/22 12:37 06/05/22 16:26 06/05/22 20:09 06/05/22 23:37 06/06/22 03:40 06/06/22 07:53  Glucose-Capillary 70 - 99 mg/dL 215 (H) 246 (H) 200 (H) 217 (H) 199 (H) 211 (H) 286 (H)   Diabetes history: DM 2 Outpatient Diabetes medications: None Listed Current orders for Inpatient glycemic control:  Novolog 0-20 units Q4 Semglee 28 units bid  Vital 1.5 cal  60 ml/hour  Inpatient Diabetes Program Recommendations:    - May consider starting Novolog Q4 Tube Feed coverage  Thanks,  Tama Headings RN, MSN, BC-ADM Inpatient Diabetes Coordinator Team Pager [phone] (8a-5p)

## 2022-06-07 DIAGNOSIS — I2609 Other pulmonary embolism with acute cor pulmonale: Secondary | ICD-10-CM | POA: Diagnosis not present

## 2022-06-07 LAB — GLUCOSE, CAPILLARY
Glucose-Capillary: 211 mg/dL — ABNORMAL HIGH (ref 70–99)
Glucose-Capillary: 230 mg/dL — ABNORMAL HIGH (ref 70–99)
Glucose-Capillary: 219 mg/dL — ABNORMAL HIGH (ref 70–99)
Glucose-Capillary: 205 mg/dL — ABNORMAL HIGH (ref 70–99)

## 2022-06-07 LAB — COMPREHENSIVE METABOLIC PANEL
ALT: 32 U/L (ref 0–44)
AST: 33 U/L (ref 15–41)
Albumin: 2.5 g/dL — ABNORMAL LOW (ref 3.5–5.0)
Alkaline Phosphatase: 68 U/L (ref 38–126)
Anion gap: 8 (ref 5–15)
BUN: 53 mg/dL — ABNORMAL HIGH (ref 8–23)
CO2: 18 mmol/L — ABNORMAL LOW (ref 22–32)
Calcium: 8.2 mg/dL — ABNORMAL LOW (ref 8.9–10.3)
Chloride: 113 mmol/L — ABNORMAL HIGH (ref 98–111)
Creatinine, Ser: 1.9 mg/dL — ABNORMAL HIGH (ref 0.61–1.24)
GFR, Estimated: 39 mL/min — ABNORMAL LOW (ref >60)
Glucose, Bld: 171 mg/dL — ABNORMAL HIGH (ref 70–99)
Potassium: 3.8 mmol/L (ref 3.5–5.1)
Sodium: 139 mmol/L (ref 135–145)
Total Bilirubin: 1 mg/dL (ref 0.3–1.2)
Total Protein: 6.5 g/dL (ref 6.5–8.1)

## 2022-06-07 LAB — C-REACTIVE PROTEIN: CRP: 0.9 mg/dL (ref <1.0)

## 2022-06-07 LAB — BRAIN NATRIURETIC PEPTIDE: B Natriuretic Peptide: 40.2 pg/mL (ref 0.0–100.0)

## 2022-06-07 LAB — MAGNESIUM: Magnesium: 2.1 mg/dL (ref 1.7–2.4)

## 2022-06-07 LAB — PROCALCITONIN: Procalcitonin: <0.1 ng/mL

## 2022-06-07 MED ORDER — FREE WATER
500.0000 mL | Freq: Once | Status: DC
  Administered 2022-06-07: 500 mL

## 2022-06-07 MED ORDER — PROSOURCE PLUS PO LIQD: 30.0000 mL | Freq: Three times a day (TID) | ORAL | Status: DC

## 2022-06-07 NOTE — Progress Notes (Addendum)
Occupational Therapy Treatment Patient Details Name: Frank Shelton. 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  Pt was intubated from 12/30-1/13.  CXR reported small right pleural effusion with probable adjacent right basilar Atelectasis. Significant PMH: PE/DVT, DM, HTN, gout, COPD, OSA.   OT comments  Patient with good progress toward patient focused goals.  Increased ability to participate, but remains weak and not aware of deficits. Patient able to progress to the edge of bed with Mod A of one, and sat at the edge to complete grooming, upper body bathing and bathed the tops of his legs.  Patient with HR up to 103 and after 10 min, increased complaint of fatigue.  Goals added and adjusted to reflect improving status.  OT to continue efforts in the acute setting to address goals and assist with transition to next level of care.  AIR continues to be recommended.     Recommendations for follow up therapy are one component of a multi-disciplinary discharge planning process, led by the attending physician.  Recommendations may be updated based on patient status, additional functional criteria and insurance authorization.    Follow Up Recommendations  Acute inpatient rehab (3hours/day)     Assistance Recommended at Discharge Frequent or constant Supervision/Assistance  Patient can return home with the following  A lot of help with bathing/dressing/bathroom;Direct supervision/assist for medications management;Direct supervision/assist for financial management;Assist for transportation;Help with stairs or ramp for entrance;Two people to help with walking and/or transfers   Equipment Recommendations  None recommended  by OT    Recommendations for Other Services      Precautions / Restrictions Precautions Precautions: Fall Restrictions Weight Bearing Restrictions: No       Mobility Bed Mobility Overal bed mobility: Needs Assistance       Supine to sit: Mod assist, HOB elevated Sit to supine: Mod assist   General bed mobility comments: Assist with trunk and legs    Transfers                         Balance Overall balance assessment: Needs assistance Sitting-balance support: Feet supported, No upper extremity supported Sitting balance-Leahy Scale: Fair                                     ADL either performed or assessed with clinical judgement   ADL       Grooming: Min guard;Sitting   Upper Body Bathing: Minimal assistance;Sitting   Lower Body Bathing: Maximal assistance;Sitting/lateral leans   Upper Body Dressing : Minimal assistance;Sitting   Lower Body Dressing: Maximal assistance;Sitting/lateral leans       Toileting- Clothing Manipulation and Hygiene: Maximal assistance;Bed level                                              Cognition Arousal/Alertness: Awake/alert Behavior During Therapy: WFL for tasks assessed/performed Overall Cognitive Status: Impaired/Different from baseline  Memory: Decreased short-term memory Following Commands: Follows one step commands consistently Safety/Judgement: Decreased awareness of safety, Decreased awareness of deficits Awareness: Intellectual Problem Solving: Slow processing, Requires verbal cues, Requires tactile cues                             Pertinent Vitals/ Pain       Pain Assessment Pain Assessment: Faces Faces Pain Scale: Hurts little more Pain Location: LLE with movement Pain Descriptors / Indicators: Discomfort, Guarding, Grimacing Pain Intervention(s): Monitored during session                                                           Frequency  Min 2X/week        Progress Toward Goals  OT Goals(current goals can now be found in the care plan section)  Progress towards OT goals: Progressing toward goals  Acute Rehab OT Goals Patient Stated Goal: Go home tomorrow OT Goal Formulation: With patient Time For Goal Achievement: 06/20/22 Potential to Achieve Goals: Fair ADL Goals Pt Will Perform Grooming: with set-up;sitting Pt Will Perform Upper Body Bathing: with set-up;sitting Pt Will Perform Lower Body Bathing: with mod assist;sitting/lateral leans Pt Will Perform Upper Body Dressing: with supervision;sitting Pt Will Perform Lower Body Dressing: with mod assist;sitting/lateral leans Pt Will Transfer to Toilet: with mod assist;with +2 assist;stand pivot transfer;bedside commode Additional ADL Goal #1: Complete bed mobility with Min Guard and use of side rail to increase ADL ind  Plan Discharge plan remains appropriate    Co-evaluation                 AM-PAC OT "6 Clicks" Daily Activity     Outcome Measure   Help from another person eating meals?: A Little Help from another person taking care of personal grooming?: A Little Help from another person toileting, which includes using toliet, bedpan, or urinal?: Total Help from another person bathing (including washing, rinsing, drying)?: A Lot Help from another person to put on and taking off regular upper body clothing?: A Little Help from another person to put on and taking off regular lower body clothing?: A Lot 6 Click Score: 14    End of Session    OT Visit Diagnosis: Unsteadiness on feet (R26.81);Muscle weakness (generalized) (M62.81)   Activity Tolerance Patient tolerated treatment well   Patient Left in bed;with call bell/phone within reach;with bed alarm set   Nurse Communication Mobility status        Time: 1610-9604 OT Time Calculation (min): 25 min  Charges: OT General Charges $OT Visit: 1  Visit OT Treatments $Self Care/Home Management : 23-37 mins  06/07/2022  RP, OTR/L  Acute Rehabilitation Services  Office:  [phone]   Frank Shelton 06/07/2022, 4:38 PM

## 2022-06-07 NOTE — Progress Notes (Addendum)
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  He had some bleeding from his groin at the site of arterial stick in ICU, he also went into AKI.  He was seen by [facility] and nephrology, he was intubated for several days finally extubated on 05/28/2022, he also developed pneumonia for which she finished on antibiotics on 05/29/2022.  He is currently diffusely weak, has NG tube for feeding. He was transferred to hospitalist service on 05/30/2022, he is still diffusely weak, bedbound with NG tube feed dependence.  Still has severe hypernatremia with improving renal function.   Significant Hospital Events: 06-07-23: Cardiac arrest while in BR. CPR  initiated. TNK administered during CPR, fem cvl  placed/ post arrest shock, high dose pressors. Failed attempt on right femoral and right IJ access. Time to ROSC estimated over 30 minutes. Hgb dropped 12 to 8.8  12/31 on 3 pressors and stress dose steroids. In DKA. Insulin gtt started. LMWH placed on hold.  MRSA PCR screen 2023/06/07 > neg  BC x 2 1/1  > one /two pos gpc + staph hominis. Unasyn started, subsequently was switched to meropenem in ICU finished all his antibiotics on 05/29/2022. 1/3 transferred to [facility] central venous catheter removed.  Blood cultures repeated.  Repeat echo: EF 50 to 55% no regional wall abnormality grade 1 diastolic dysfunction RV normal Renal function improving with Lasix.  Weaning pressors.  Adding Seroquel and clonazepam to assist with weaning 1/7 ETT exchange, bronch for ongoing hemoptysis 1/10 no hemoptysis; resuming heparin today; off levo 1/11 starting to  wake up a bit, nephro decr lasix and incr FWF   1/12 more mucus, having incr peaks, changing sedation  1/13 finally awake following commands off vasopressors passed SBT.  Trial of extubation. 1/15 transferred to [facility]. 1/16 TSH elevated at 55, started on IV levothyroxine.  1/19 Pro cal at 0.14, CRP down to 1.4, BNP normal. TSH still elevated at 57, FT4 normal 1/23 NG tube clamped. D1D started.  Subjective:  Overnight:NAEON  Patient doing significantly better this a.m.  Fully alert and oriented.  He was eating his breakfast in the morning at the time of evaluation. Objective:  Vital signs in last 24 hours: Vitals:   06/06/22 1940 06/06/22 2000 06/06/22 2315 06/07/22 0400  BP:  125/78 119/78 123/78  Pulse:   75 79  Resp:  (!) 21 (!) 21 18  Temp:  98.1 F (36.7 C) 98.7 F (37.1 C) 97.8 F (36.6 C)  TempSrc:  Oral Oral Oral  SpO2: 95% 94% 94% 95%  Weight:      Height:       Supplemental O2: Room air Last BM Date : 06/06/22 SpO2: 95 % O2 Flow Rate (L/min): 6 L/min FiO2 (%): 40 % Filed Weights   05/28/22 0330 05/29/22 0400 05/30/22 0400  Weight: (!) 162 kg (!) 161.4 kg (!) 161 kg    Intake/Output Summary (Last 24 hours) at 06/07/2022 0254 Last data filed at 06/07/2022 0500 Gross per 24 hour  Intake --  Output 3050 ml  Net -3050 ml    Net IO Since Admission: -  13,033.09 mL [06/07/22 0624] Physical Exam  General: NAD, looks alert and more talkative.  HENT: NCAT Lungs:  CTAB Cardiovascular: NSR Abdomen: No TTP MSK: No asymmetry, moving all extremities Skin: no lesions noted on exam Neuro: fully alert and oriented x4 Diagnostics    Latest Ref Rng & Units 05/31/2022    5:31 AM 05/30/2022   12:47 AM 05/29/2022    3:55 AM  CBC  WBC 4.0 - 10.5 K/uL 10.5  11.9  13.2   Hemoglobin 13.0 - 17.0 g/dL 8.8  9.2  8.3   Hematocrit 39.0 - 52.0 % 29.0  30.2  26.6   Platelets 150 - 400 K/uL 243  225  197        Latest Ref Rng & Units 06/06/2022    2:37 AM 06/05/2022    3:02 AM  06/04/2022    4:21 AM  BMP  Glucose 70 - 99 mg/dL 295  284  132   BUN 8 - 23 mg/dL 55  60  63   Creatinine 0.61 - 1.24 mg/dL 4.40  1.02  7.25   Sodium 135 - 145 mmol/L 145  148  150   Potassium 3.5 - 5.1 mmol/L 3.8  4.2  3.8   Chloride 98 - 111 mmol/L 118  121  120   CO2 22 - 32 mmol/L 18  18  22    Calcium 8.9 - 10.3 mg/dL 8.5  8.7  8.8      Assessment/Plan: Pt is a 72 yowm with 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   Principal Problem:   Pulmonary embolism (HCC) Active Problems:   GOUT   OBSTRUCTIVE SLEEP APNEA   Essential hypertension   Other specified chronic obstructive pulmonary disease   Hypercoagulopathy (HCC)   DM type 2 causing vascular disease (HCC)   Upper GI bleed   Coffee ground emesis   Acute respiratory failure with hypoxia and hypercapnia (HCC)   Shock (HCC)   Acute respiratory failure (HCC)   Enterobacter cloacae pneumonia   Septic shock (HCC)   AKI (acute kidney injury) (HCC)   Encephalopathy acute   Severe sepsis (HCC)  Acute PE in a patient with hypercoagulable state who was noncompliant with his Xarelto for a few days at home. Complicated by PEA arrest.  He has been treated in ICU with Altepase and anticoagulation, required endotracheal intubation and mechanical ventilation for several days, remained intubated for close to 10 days and finally extubated on 05/28/2022.  Also developed hospital/ventilator acquired pneumonia for which she has finished all his antibiotics on 05/29/2022. Is currently on oral Eliquis for his DVT PE. Satting well on RA. Hemodynamically stable and improving.   Myxedema with history of hypothyroidism   Toxic and metabolic encephalopathy Improving. No focal deficits, patient  significantly improved. Pt with early myxedema with TSH in excess of 55, pt switched to IV synthroid 200 mcg daily. Seeing significant improvement in pt with IV synthroid. FT4 normalized. Pt NG tube clamped and dysphagia diet started yesterday.  Will give 500 cc of free water today and remove the NG tube.  Will continue advance his diet as able.  AKI, dehydration and hypernatremia.  Seen by nephrology. Overall improving. Intravascularly appears dehydrated, holding lasix. Sodium  normalized. Will give 500 cc of free water today and remove the NG tube. Renal function is improving. AKI likely due to shock and is improving.  -Trend daily BMP   DM type II.  Since tube feeds were stopped. Pt insulin regimen changed to 28 units of semglee and mod-SSI with meals. CBG last night was 155.  Continue current insulin regimen  CBG (last 3)  Recent Labs    06/06/22 1231 06/06/22 1604 06/06/22 2022  GLUCAP 280* 213* 155*    Paroxysmal atrial fibrillation.  Mali vas 2 score of greater than 4.   -Pt was on amiodarone and Eliquis combination but Amiodarone dced 01/19. Will continue Eliquis. Pt in NSR.    Hypertension.   Chronic. Normotensive this am. Placed on lopressor 25 mg BID. CTM.    Anemia of acute/critical illness along with some blood loss initially in ICU due to right groin hematoma at the site of arterial stick.  Stable for now. -Continue to monitor on Eliquis.   COPD and OSA.   Chronic. Supportive care nighttime CPAP.   Morbid obesity.  BMI 43.   Chronic. Follow-up with PCP for weight loss.   History of gastroparesis.  Monitor on bowel regimen and Reglan.   Diet: D1D IVF: NA VTE: Eliquis Code: Full PT/OT recs: CIR, none. Prior to Admission Living Arrangement:Home Anticipated Discharge Location:CIR Barriers to Discharge:Medical workup  Dispo: Anticipated discharge to CIR in approximately 2-3 day(s).   Idamae Schuller, MD Tillie Rung. [facility], PGY-2   Pager: [phone] After 5 pm and on weekends: Please call the on-call pager.   Attestation -   I have directly reviewed the clinical findings, lab results and imaging studies. I have interviewed and examined the patient and agree with the documentation and management as recorded by the Dr Humphrey Rolls, admitted for myxedema, AKI, hypernatremia due to dehydration along with acute PE causing PEA.  Much improved, getting IV Synthroid, generalized weakness and deconditioning improving, speech has improved, placed on dysphagia 1 diet by speech, overall improving.  Will remove NG tube on 06/07/2022 and advance activity.  May require SNF.

## 2022-06-07 NOTE — Progress Notes (Signed)
Nutrition Follow-up  DOCUMENTATION CODES:  Morbid obesity  INTERVENTION:  Encourage adequate PO intake Request updated weight Continue MVI with minerals daily  NUTRITION DIAGNOSIS:  Inadequate oral intake related to acute illness as evidenced by NPO status. - improving, pt now on Dysphagia 1 diet  GOAL:  Patient will meet greater than or equal to 90% of their needs - progressing  MONITOR:   Diet advancement, Labs, Weight trends, TF tolerance, Skin  REASON FOR ASSESSMENT:   Consult, Ventilator Enteral/tube feeding initiation and management  ASSESSMENT:   Pt admitted with chest pain and difficulty breathing, found to have multiple pulmonary emboli and LLE DVT. PMH significant for PE, DVT, DM, HTN, gout, COPD, OSA.  12/28: Admitted 12/30: Cardiac arrest, Intubated 01/03: Transferred from [facility], TF initiated 01/05: large volume of emesis overnight and tube feeds held; Cortrak tube placed with tip in proximal duodenum and trickle tube feeds started 1/07: ETT exchange, bronch 1/08: began titration of TF to goal rate  1/13: Extubated  1/22: s/p ST eval, unsuccessful attempt at FEES; BSE performed; diet adv to dysphagia 1/honey thick 1/23: Cortrak tube removed  Pt sitting up in bed with his mom and dad present at bedside. Cortrak had been removed prior to visit. Observed lunch tray on table with all except pureed peaches eaten. His mom brought him homemade banana pudding with well mashed bananas and well crushed wafers which he had only take a couple bites of at time of visit.   Pt is hopeful for further diet advancement however is tolerating his current diet well overall.   Last documented weight was 161 kg on 1/15. Will request updated weight to reassess for weight changes since that time.   Edema: moderate pitting RLE, perineal  Medications: SSI 0-15 units TID, semglee 28 units daily, reglan, rena-vit  Labs: BUN 53, Cr 1.90, GFR 39, CBG's 155-230 x24  hours  UOP: 3025ml x24 hours I/O's: -14.1L since 1/9  Diet Order:   Diet Order             DIET - DYS 1 Room service appropriate? Yes with Assist; Fluid consistency: Honey Thick  Diet effective now                   EDUCATION NEEDS:  No education needs have been identified at this time  Skin:  Skin Assessment: Skin Integrity Issues: Skin Integrity Issues:: DTI DTI: vertebral column  Last BM:  1/22 (type 6 x2)  Height:  Ht Readings from Last 1 Encounters:  05/18/22 6\' 4"  (1.93 m)   Weight:  Wt Readings from Last 1 Encounters:  05/30/22 (!) 161 kg    Ideal Body Weight:  91.8 kg  BMI:  Body mass index is 43.2 kg/m.  Estimated Nutritional Needs:   Kcal:  2200-2400  Protein:  140-160g  Fluid:  >/=2L  Clayborne Dana, RDN, LDN Clinical Nutrition

## 2022-06-08 DIAGNOSIS — E1159 Type 2 diabetes mellitus with other circulatory complications: Secondary | ICD-10-CM | POA: Diagnosis not present

## 2022-06-08 DIAGNOSIS — I2609 Other pulmonary embolism with acute cor pulmonale: Secondary | ICD-10-CM | POA: Diagnosis not present

## 2022-06-08 DIAGNOSIS — G9340 Encephalopathy, unspecified: Secondary | ICD-10-CM | POA: Diagnosis not present

## 2022-06-08 DIAGNOSIS — J9601 Acute respiratory failure with hypoxia: Secondary | ICD-10-CM | POA: Diagnosis not present

## 2022-06-08 LAB — GLUCOSE, CAPILLARY
Glucose-Capillary: 156 mg/dL — ABNORMAL HIGH (ref 70–99)
Glucose-Capillary: 162 mg/dL — ABNORMAL HIGH (ref 70–99)
Glucose-Capillary: 176 mg/dL — ABNORMAL HIGH (ref 70–99)
Glucose-Capillary: 182 mg/dL — ABNORMAL HIGH (ref 70–99)

## 2022-06-08 LAB — COMPREHENSIVE METABOLIC PANEL
ALT: 33 U/L (ref 0–44)
AST: 36 U/L (ref 15–41)
Albumin: 2.6 g/dL — ABNORMAL LOW (ref 3.5–5.0)
Alkaline Phosphatase: 66 U/L (ref 38–126)
Anion gap: 9 (ref 5–15)
BUN: 48 mg/dL — ABNORMAL HIGH (ref 8–23)
CO2: 19 mmol/L — ABNORMAL LOW (ref 22–32)
Calcium: 8.2 mg/dL — ABNORMAL LOW (ref 8.9–10.3)
Chloride: 115 mmol/L — ABNORMAL HIGH (ref 98–111)
Creatinine, Ser: 2.21 mg/dL — ABNORMAL HIGH (ref 0.61–1.24)
GFR, Estimated: 33 mL/min — ABNORMAL LOW (ref >60)
Glucose, Bld: 163 mg/dL — ABNORMAL HIGH (ref 70–99)
Potassium: 3.9 mmol/L (ref 3.5–5.1)
Sodium: 143 mmol/L (ref 135–145)
Total Bilirubin: 1.2 mg/dL (ref 0.3–1.2)
Total Protein: 6.7 g/dL (ref 6.5–8.1)

## 2022-06-08 LAB — BRAIN NATRIURETIC PEPTIDE: B Natriuretic Peptide: 34.4 pg/mL (ref 0.0–100.0)

## 2022-06-08 LAB — C-REACTIVE PROTEIN: CRP: 0.8 mg/dL (ref <1.0)

## 2022-06-08 LAB — PROCALCITONIN: Procalcitonin: <0.1 ng/mL

## 2022-06-08 LAB — MAGNESIUM: Magnesium: 2.2 mg/dL (ref 1.7–2.4)

## 2022-06-08 MED ORDER — GUAIFENESIN 100 MG/5ML PO LIQD
5.0000 mL | ORAL | Status: DC | PRN
  Administered 2022-06-08 – 2022-06-11 (×8): 5 mL via ORAL
  Filled 2022-06-08 (×9): qty 5

## 2022-06-08 MED ORDER — INSULIN ASPART 100 UNIT/ML IJ SOLN
0.0000 [IU] | Freq: Every day | INTRAMUSCULAR | Status: DC
  Administered 2022-06-12: 2 [IU] via SUBCUTANEOUS

## 2022-06-08 MED ORDER — LEVOTHYROXINE SODIUM 50 MCG PO TABS
250.0000 ug | ORAL_TABLET | Freq: Every day | ORAL | Status: DC
  Administered 2022-06-09 – 2022-06-14 (×6): 250 ug via ORAL
  Filled 2022-06-08 (×6): qty 1

## 2022-06-08 NOTE — Progress Notes (Signed)
PROGRESS NOTE    [name]  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  He had some bleeding from his groin at the site of arterial stick in ICU, he also went into AKI.  He was seen by [facility] and nephrology, he was intubated for several days finally extubated on 05/28/2022, he also developed pneumonia for which she finished on antibiotics on 05/29/2022.  He is currently diffusely weak, has NG tube for feeding. He was transferred to hospitalist service on 05/30/2022, he is still diffusely weak, bedbound with NG tube feed dependence.  Still has severe hypernatremia with improving renal function.    Significant Hospital Events: 20-May-2023: Cardiac arrest while in BR. CPR  initiated. TNK administered during CPR, fem cvl  placed/ post arrest shock, high dose pressors. Failed attempt on right femoral and right IJ access. Time to ROSC estimated over 30 minutes. Hgb dropped 12 to 8.8  12/31 on 3 pressors and stress dose steroids. In DKA. Insulin gtt started. LMWH placed on hold.  MRSA PCR screen 05/20/23 > neg  BC x 2 1/1  > one /two pos gpc + staph hominis. Unasyn started, subsequently was switched to meropenem in ICU finished all his antibiotics on 05/29/2022. 1/3 transferred to [facility] venous catheter removed.  Blood cultures repeated.  Repeat echo: EF 50 to 55% no regional wall abnormality grade 1 diastolic dysfunction RV normal Renal function improving with Lasix.  Weaning pressors.  Adding Seroquel and clonazepam to assist with weaning 27-May-2022 ETT exchange, bronch for ongoing  hemoptysis 1/10 no hemoptysis; resuming heparin today; off levo 1/11 starting to wake up a bit, nephro decr lasix and incr FWF   1/12 more mucus, having incr peaks, changing sedation  1/13 finally awake following commands off vasopressors passed SBT.  Trial of extubation. 1/15 transferred to [facility]. 1/16 TSH elevated at 55, started on IV levothyroxine.  1/19 Pro cal at 0.14, CRP down to 1.4, BNP normal. TSH still elevated at 57, FT4 normal 1/23 NG tube clamped. D1D started. Principal Problem:   Pulmonary embolism (HCC) Active Problems:   GOUT   OBSTRUCTIVE SLEEP APNEA   Essential hypertension   Other specified chronic obstructive pulmonary disease   Hypercoagulopathy (HCC)   DM type 2 causing vascular disease (HCC)   Upper GI bleed   Coffee ground emesis   Acute respiratory failure with hypoxia and hypercapnia (HCC)   Shock (HCC)   Acute respiratory failure (HCC)   Enterobacter cloacae pneumonia   Septic shock (HCC)   AKI (acute kidney injury) (HCC)   Encephalopathy acute   Severe sepsis (HCC)  Acute PE in a patient with hypercoagulable state who was noncompliant with his Xarelto for a few days at home. Complicated by PEA arrest.  He has been treated in ICU with Altepase and anticoagulation, required endotracheal intubation and mechanical ventilation for several days, remained intubated for close to 10 days and finally extubated on 05/28/2022.  Also developed hospital/ventilator acquired pneumonia for which she has finished all his  antibiotics on 05/29/2022. Is currently on oral Eliquis for his DVT PE. Satting well on RA. Hemodynamically stable and improving.   Myxedema with history of hypothyroidism   Toxic and metabolic encephalopathy -Mental status much improved, no focal deficits, but remains generally weak and deconditioned -Initially requiring IV Synthroid, will transition to oral Synthroid today. -NG tube was discontinued yesterday, tolerating oral intake, remains on dysphagia  1 with nectar thick  AKI, dehydration and hypernatremia.  Seen by nephrology. Overall improving.  - Intravascularly appears dehydrated, holding lasix. Sodium normalized.  -Initially on free water via NG tube .no NG tube as this been discontinued will encourage fluid intake .  DM type II.  Since tube feeds were stopped. Pt insulin regimen changed to 28 units of semglee and mod-SSI with meals. CBG last night was 155.  Continue current insulin regimen   CBG (last 3)  Recent Labs (last 2 labs)       Recent Labs    06/06/22 1231 06/06/22 1604 06/06/22 2022  GLUCAP 280* 213* 155*       Paroxysmal atrial fibrillation.  Mali vas 2 score of greater than 4.   -Pt was on amiodarone and Eliquis combination but Amiodarone dced 01/19. Will continue Eliquis. Pt in NSR.    Hypertension.   Chronic. Normotensive this am. Placed on lopressor 25 mg BID. CTM.     Anemia of acute/critical illness along with some blood loss initially in ICU due to right groin hematoma at the site of arterial stick.  Stable for now. -Continue to monitor on Eliquis.   COPD and OSA.   Chronic. Supportive care nighttime CPAP.   Morbid obesity.  BMI 43.   Chronic. Follow-up with PCP for weight loss.   DVT prophylaxis: eLIQUIS Code Status: Full Family Communication: None at bedside Disposition:   Status is: Inpatient    Consultants:  PCCM Renal GI   Subjective:  He passed swallow evaluation yesterday, tolerating dysphagia 1 with nectar thick  Objective: Vitals:   06/08/22 0102 06/08/22 0300 06/08/22 0500 06/08/22 0800  BP:  122/82    Pulse:  90    Resp:  16    Temp:  98.1 F (36.7 C)    TempSrc:  Oral    SpO2: 100% 93%  94%  Weight:   (!) 153.4 kg   Height:        Intake/Output Summary (Last 24 hours) at 06/08/2022 1139 Last data filed at 06/08/2022 0500 Gross per 24 hour  Intake --  Output 3250 ml  Net -3250 ml   Filed Weights   05/29/22 0400 05/30/22 0400 06/08/22 0500  Weight: (!) 161.4  kg (!) 161 kg (!) 153.4 kg    Examination:  Awake Alert, Oriented X 2, frail, deconditioned, chronically ill-appearing, appears older than stated age Symmetrical Chest wall movement, Good air movement bilaterally, diminished air entry at the bases RRR,No Gallops,Rubs or new Murmurs, No Parasternal Heave +ve B.Sounds, Abd Soft, No tenderness, No rebound - guarding or rigidity. No Cyanosis, Clubbing or edema, chronic lower EXTR skin changes    Data Reviewed: I have personally reviewed following labs and imaging studies  CBC: No results for input(s): "WBC", "NEUTROABS", "HGB", "HCT", "MCV", "PLT" in the last 168 hours.  Basic Metabolic Panel: Recent Labs  Lab 06/04/22 0421 06/05/22 0302 06/06/22 0237 06/07/22 0725 06/08/22 0804  NA 150* 148* 145 139 143  K 3.8 4.2 3.8 3.8 3.9  CL 120* 121* 118* 113* 115*  CO2 22 18* 18* 18* 19*  GLUCOSE 258* 212* 273* 171* 163*  BUN 63* 60* 55* 53* 48*  CREATININE 2.27* 2.10* 1.93* 1.90* 2.21*  CALCIUM 8.8* 8.7* 8.5* 8.2* 8.2*  MG 2.4 2.3 2.1 2.1 2.2    GFR: Estimated Creatinine Clearance: 54.9 mL/min (A) (by C-G formula based on SCr of 2.21 mg/dL (H)).  Liver Function Tests: Recent Labs  Lab 06/04/22 0421 06/05/22 0302 06/06/22 0237 06/07/22 0725 06/08/22 0804  AST 31 32 35 33 36  ALT 31 32 34 32 33  ALKPHOS 53 61 64 68 66  BILITOT 0.9 0.9 0.9 1.0 1.2  PROT 7.0 6.9 6.5 6.5 6.7  ALBUMIN 2.3* 2.5* 2.5* 2.5* 2.6*    CBG: Recent Labs  Lab 06/07/22 0924 06/07/22 1207 06/07/22 1859 06/07/22 2044 06/08/22 0810  GLUCAP 211* 230* 219* 205* 156*     No results found for this or any previous visit (from the past 240 hour(s)).       Radiology Studies: No results found.      Scheduled Meds:  apixaban  5 mg Per Tube BID   budesonide (PULMICORT) nebulizer solution  0.5 mg Nebulization BID   Chlorhexidine Gluconate Cloth  6 each Topical Daily   insulin aspart  0-15 Units Subcutaneous TID WC   insulin glargine-yfgn   28 Units Subcutaneous Daily   levothyroxine  250 mcg Oral Q0600   metoCLOPramide (REGLAN) injection  5 mg Intravenous Q6H   metoprolol tartrate  25 mg Per NG tube BID   multivitamin  1 tablet Per Tube Daily   mouth rinse  15 mL Mouth Rinse 4 times per day   Continuous Infusions:   LOS: 25 days       Phillips Climes, MD [facility]   To contact the attending provider between 7A-7P or the covering provider during after hours 7P-7A, please log into the web site [email or web address] and access using universal [place] password for that web site. If you do not have the password, please call the hospital operator.  06/08/2022, 11:39 AM

## 2022-06-08 NOTE — Progress Notes (Signed)
Inpatient Rehab Admissions Coordinator:   Pt continues to require significant assist for limited mobility.  May benefit from lower intensity rehab at this time.    Shann Medal, PT, DPT Admissions Coordinator [phone] 06/08/22  2:37 PM

## 2022-06-08 NOTE — Progress Notes (Signed)
Physical Therapy Treatment Patient Details Name: Frank Shelton. 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  Pt was intubated from 12/30-1/13.  CXR reported small right pleural effusion with probable adjacent right basilar Atelectasis. Significant PMH: PE/DVT, DM, HTN, gout, COPD, OSA.    PT Comments    Pt participated well in today's session, limited by fatigue, but able to fully achieve standing x2 trials with maxAx2 and 2HHA. Pt attempted 5 stands, progressing with each trial, all from elevated surface. First two attempts pt barely clearing his bottom, progressing to full bottom clearance but B knee buckling and hip flexion, progressing to full stands, with cues for hip extension and forward gaze, family members present and acting as visual targets for upright posture, RN present throughout session and providing assistance. Discussed with pt and his family recommendations for further therapy prior to discharge home, as well as explaining assistance that would be needed at home, and the difference between AIR and HHPT, family reports they will think about it as pt prefers to discharge home. Acute PT will continue to follow as appropriate, continue to recommend inpatient rehab at discharge.     Recommendations for follow up therapy are one component of a multi-disciplinary discharge planning process, led by the attending physician.  Recommendations may be updated based on patient status, additional functional criteria and insurance authorization.  Follow Up Recommendations  Acute inpatient rehab (3hours/day)     Assistance Recommended at Discharge Frequent or constant Supervision/Assistance  Patient can return  home with the following Two people to help with walking and/or transfers;Two people to help with bathing/dressing/bathroom;Assistance with cooking/housework;Direct supervision/assist for medications management;Direct supervision/assist for financial management;Assist for transportation;Help with stairs or ramp for entrance   Equipment Recommendations  Other (comment) (if pt goes home, would need hospital bed, mechanical lift, wheelchair, wheelchair cushion, BSC)    Recommendations for Other Services       Precautions / Restrictions Precautions Precautions: Fall Restrictions Weight Bearing Restrictions: No     Mobility  Bed Mobility Overal bed mobility: Needs Assistance Bed Mobility: Supine to Sit, Sit to Supine     Supine to sit: Mod assist, HOB elevated Sit to supine: Mod assist   General bed mobility comments: assit for LE management and trunk, cueing required throughout for technique    Transfers Overall transfer level: Needs assistance Equipment used: 2 person hand held assist Transfers: Sit to/from Stand Sit to Stand: Max assist, +2 physical assistance, From elevated surface           General transfer comment: max assist x2 for sit<>stand trials, able to fully stand x2 out of 5 attempts, verbal, visual, and tactile cueing provided throughout for upright posture, hip extension, and forward gaze    Ambulation/Gait                   Stairs             Wheelchair Mobility    Modified Rankin (Stroke Patients Only)       Balance Overall balance assessment: Needs assistance Sitting-balance support: Feet supported, Single extremity supported Sitting balance-Leahy Scale: Fair Sitting balance - Comments: able to sit EOB with  min guard to supervision, initial R lateral lean but corrected with cues Postural control: Right lateral lean Standing balance support: Bilateral upper extremity supported Standing balance-Leahy Scale: Zero Standing balance  comment: pt requires maxAx2 to maintain standing balance with 2HHA, able to stand ~10 seconds before requiring a seated rest break                            Cognition Arousal/Alertness: Awake/alert Behavior During Therapy: WFL for tasks assessed/performed   Area of Impairment: Attention, Following commands, Awareness                   Current Attention Level: Sustained Memory: Decreased short-term memory Following Commands: Follows one step commands consistently Safety/Judgement: Decreased awareness of safety, Decreased awareness of deficits Awareness: Intellectual Problem Solving: Slow processing, Requires verbal cues, Requires tactile cues General Comments: pt's cognition seems improved from previous sessions, drowsy upon arrival of PT but awakening with mobility        Exercises General Exercises - Lower Extremity Ankle Circles/Pumps: Both, AROM, 10 reps, Seated Long Arc Quad: AROM, Both, 10 reps, Seated Hip Flexion/Marching: AROM, Both, 10 reps, Seated    General Comments General comments (skin integrity, edema, etc.): VSS on room air      Pertinent Vitals/Pain Pain Assessment Pain Assessment: No/denies pain    Home Living                          Prior Function            PT Goals (current goals can now be found in the care plan section) Acute Rehab PT Goals Patient Stated Goal: to go home PT Goal Formulation: With patient/family Time For Goal Achievement: 06/12/22 Potential to Achieve Goals: Fair Progress towards PT goals: Progressing toward goals    Frequency    Min 3X/week      PT Plan Current plan remains appropriate    Co-evaluation              AM-PAC PT "6 Clicks" Mobility   Outcome Measure  Help needed turning from your back to your side while in a flat bed without using bedrails?: A Lot Help needed moving from lying on your back to sitting on the side of a flat bed without using bedrails?: A Lot Help  needed moving to and from a bed to a chair (including a wheelchair)?: A Lot Help needed standing up from a chair using your arms (e.g., wheelchair or bedside chair)?: Total Help needed to walk in hospital room?: Total Help needed climbing 3-5 steps with a railing? : Total 6 Click Score: 9    End of Session   Activity Tolerance: Patient limited by fatigue Patient left: with call bell/phone within reach;in bed;with family/visitor present Nurse Communication: Mobility status PT Visit Diagnosis: Other abnormalities of gait and mobility (R26.89);Muscle weakness (generalized) (M62.81)     Time: 5732-2025 PT Time Calculation (min) (ACUTE ONLY): 44 min  Charges:  $Therapeutic Exercise: 8-22 mins $Therapeutic Activity: 23-37 mins                     Charlynne Cousins, PT DPT Acute Rehabilitation Services Office [phone]    Luvenia Heller 06/08/2022, 4:00 PM

## 2022-06-08 NOTE — Progress Notes (Signed)
TRH night cross cover note:   Patient with before every meal and at bedtime Accu-Cheks ordered along with sliding scale on before every meal basis.  I added nightly sliding scale insulin to this regimen.     Babs Bertin, DO Hospitalist
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
## 2022-06-09 DIAGNOSIS — I2609 Other pulmonary embolism with acute cor pulmonale: Secondary | ICD-10-CM | POA: Diagnosis not present

## 2022-06-09 DIAGNOSIS — J9601 Acute respiratory failure with hypoxia: Secondary | ICD-10-CM | POA: Diagnosis not present

## 2022-06-09 LAB — BRAIN NATRIURETIC PEPTIDE: B Natriuretic Peptide: 38.1 pg/mL (ref 0.0–100.0)

## 2022-06-09 LAB — COMPREHENSIVE METABOLIC PANEL
ALT: 33 U/L (ref 0–44)
AST: 35 U/L (ref 15–41)
Albumin: 2.6 g/dL — ABNORMAL LOW (ref 3.5–5.0)
Alkaline Phosphatase: 67 U/L (ref 38–126)
Anion gap: 7 (ref 5–15)
BUN: 39 mg/dL — ABNORMAL HIGH (ref 8–23)
CO2: 20 mmol/L — ABNORMAL LOW (ref 22–32)
Calcium: 8.1 mg/dL — ABNORMAL LOW (ref 8.9–10.3)
Chloride: 113 mmol/L — ABNORMAL HIGH (ref 98–111)
Creatinine, Ser: 1.92 mg/dL — ABNORMAL HIGH (ref 0.61–1.24)
GFR, Estimated: 39 mL/min — ABNORMAL LOW (ref >60)
Glucose, Bld: 161 mg/dL — ABNORMAL HIGH (ref 70–99)
Potassium: 3.8 mmol/L (ref 3.5–5.1)
Sodium: 140 mmol/L (ref 135–145)
Total Bilirubin: 0.8 mg/dL (ref 0.3–1.2)
Total Protein: 6.6 g/dL (ref 6.5–8.1)

## 2022-06-09 LAB — GLUCOSE, CAPILLARY
Glucose-Capillary: 141 mg/dL — ABNORMAL HIGH (ref 70–99)
Glucose-Capillary: 180 mg/dL — ABNORMAL HIGH (ref 70–99)
Glucose-Capillary: 140 mg/dL — ABNORMAL HIGH (ref 70–99)
Glucose-Capillary: 194 mg/dL — ABNORMAL HIGH (ref 70–99)

## 2022-06-09 LAB — C-REACTIVE PROTEIN: CRP: 0.7 mg/dL (ref <1.0)

## 2022-06-09 LAB — PROCALCITONIN: Procalcitonin: <0.1 ng/mL

## 2022-06-09 LAB — MAGNESIUM: Magnesium: 2 mg/dL (ref 1.7–2.4)

## 2022-06-09 MED ORDER — ACETAMINOPHEN 650 MG RE SUPP: 650.0000 mg | Freq: Four times a day (QID) | RECTAL | Status: DC | PRN

## 2022-06-09 MED ORDER — ACETAMINOPHEN 325 MG PO TABS: 650.0000 mg | ORAL_TABLET | Freq: Four times a day (QID) | ORAL | Status: DC | PRN

## 2022-06-09 MED ORDER — APIXABAN 5 MG PO TABS
5.0000 mg | ORAL_TABLET | Freq: Two times a day (BID) | ORAL | Status: DC
  Administered 2022-06-09 – 2022-06-14 (×10): 5 mg via ORAL
  Filled 2022-06-09 (×11): qty 1

## 2022-06-09 MED ORDER — METOPROLOL TARTRATE 25 MG PO TABS
25.0000 mg | ORAL_TABLET | Freq: Two times a day (BID) | ORAL | Status: DC
  Administered 2022-06-09 – 2022-06-14 (×10): 25 mg via ORAL
  Filled 2022-06-09 (×10): qty 1

## 2022-06-09 MED ORDER — RENA-VITE PO TABS
1.0000 | ORAL_TABLET | Freq: Every day | ORAL | Status: DC
  Administered 2022-06-10 – 2022-06-14 (×5): 1 via ORAL
  Filled 2022-06-09 (×5): qty 1

## 2022-06-09 MED ORDER — SODIUM BICARBONATE 650 MG PO TABS
650.0000 mg | ORAL_TABLET | Freq: Three times a day (TID) | ORAL | Status: DC
  Administered 2022-06-09 – 2022-06-10 (×4): 650 mg via ORAL
  Filled 2022-06-09 (×4): qty 1

## 2022-06-09 MED ORDER — SENNOSIDES 8.8 MG/5ML PO SYRP: 15.0000 mL | ORAL_SOLUTION | Freq: Every evening | ORAL | Status: DC | PRN

## 2022-06-09 MED ORDER — METOCLOPRAMIDE HCL 10 MG PO TABS
5.0000 mg | ORAL_TABLET | Freq: Three times a day (TID) | ORAL | Status: DC
  Administered 2022-06-09 – 2022-06-14 (×20): 5 mg via ORAL
  Filled 2022-06-09 (×20): qty 1

## 2022-06-09 NOTE — Progress Notes (Signed)
Inpatient Rehab Admissions Coordinator:   Rescreened pt for progress.  He has progressed from requiring maximove for transfers to mod/max +2 for multiple attempts to stand.  Feel he's beginning to progress and may benefit from potential CIR admit.  I will place a consult order and an [facility] will f/u for assessment.   Shann Medal, PT, DPT Admissions Coordinator [phone] 06/09/22  1:50 PM

## 2022-06-09 NOTE — Progress Notes (Signed)
PROGRESS NOTE    Frank Shelton.  WUJ:[id number] DOB: [birth year] DOA: 05/12/2022 PCP: Vanessa [facility], FNP   Chief Complaint  Patient presents with   Shortness of Breath    Brief Narrative:      Assessment & Plan:   61 yowm with h/o PE/DVT on long term xarleto but missed a few doses over Christmas and presented 12/28 with recurrent PE in setting of DVT >L> R on Dopplers with RV/LV 1.2 no change from CT 06-19-20 and echo ok but coded am 06/12/23 and transferred to ICU where given Altepase empirically.  He had some bleeding from his groin at the site of arterial stick in ICU, he also went into AKI.  He was seen by [facility] and nephrology, he was intubated for several days finally extubated on 05/28/2022, he also developed pneumonia for which she finished on antibiotics on 05/29/2022.  He is currently diffusely weak, has NG tube for feeding. He was transferred to hospitalist service on 05/30/2022, he is still diffusely weak, bedbound with NG tube feed dependence.  Still has severe hypernatremia with improving renal function.    Significant Hospital Events: 06-12-23: Cardiac arrest while in BR. CPR  initiated. TNK administered during CPR, fem cvl  placed/ post arrest shock, high dose pressors. Failed attempt on right femoral and right IJ access. Time to ROSC estimated over 30 minutes. Hgb dropped 12 to 8.8  12/31 on 3 pressors and stress dose steroids. In DKA. Insulin gtt started. LMWH placed on hold.  MRSA PCR screen 06-12-2023 > neg  BC x 2 1/1  > one /two pos gpc + staph hominis. Unasyn started, subsequently was switched to meropenem in ICU finished all his antibiotics on 05/29/2022. 1/3 transferred to [facility] central venous catheter removed.  Blood cultures repeated.  Repeat echo: EF 50 to 55% no regional wall abnormality grade 1 diastolic dysfunction RV normal Renal function improving with Lasix.  Weaning pressors.  Adding Seroquel and clonazepam to assist with weaning 2022-06-19 ETT exchange, bronch for ongoing  hemoptysis 1/10 no hemoptysis; resuming heparin today; off levo 1/11 starting to wake up a bit, nephro decr lasix and incr FWF   1/12 more mucus, having incr peaks, changing sedation  1/13 finally awake following commands off vasopressors passed SBT.  Trial of extubation. 1/15 transferred to [facility]. 1/16 TSH elevated at 55, started on IV levothyroxine.  1/19 Pro cal at 0.14, CRP down to 1.4, BNP normal. TSH still elevated at 57, FT4 normal 1/23 NG tube clamped. D1D started.   Principal Problem:   Pulmonary embolism (HCC) Active Problems:   GOUT   OBSTRUCTIVE SLEEP APNEA   Essential hypertension   Other specified chronic obstructive pulmonary disease   Hypercoagulopathy (HCC)   DM type 2 causing vascular disease (HCC)   Upper GI bleed   Coffee ground emesis   Acute respiratory failure with hypoxia and hypercapnia (HCC)   Shock (HCC)   Acute respiratory failure (HCC)   Enterobacter cloacae pneumonia   Septic shock (HCC)   AKI (acute kidney injury) (HCC)   Encephalopathy acute   Severe sepsis (HCC)  Acute PE in a patient with hypercoagulable state who was noncompliant with his Xarelto for a few days at home. Complicated by PEA arrest.  He has been treated in ICU with Altepase and anticoagulation, required endotracheal intubation and mechanical ventilation for several days, remained intubated for close to 10 days and finally extubated on 05/28/2022.  Also developed hospital/ventilator acquired pneumonia for which she has finished  all his antibiotics on 05/29/2022. Is currently on oral Eliquis for his DVT PE. Satting well on RA. Hemodynamically stable and improving.   Myxedema with history of hypothyroidism   Toxic and metabolic encephalopathy -Mental status much improved, no focal deficits, but remains generally weak and deconditioned -Initially requiring IV Synthroid, will transition to oral Synthroid today. -NG tube was discontinued  -Likely some encephalopathy due to his arrest,  will request cognition evaluation by SLP.  Dysphagia -Much improved, advance to heart/carb modified SLP today  AKI, dehydration and hypernatremia.  Seen by nephrology. Overall improving.  - Intravascularly appears dehydrated, holding lasix. Sodium normalized.  -Started on oral bicarb given low bicarb level  DM type II.  Since tube feeds were stopped. Pt insulin regimen changed to 28 units of semglee and mod-SSI with meals. CBG last night was 155.  Continue current insulin regimen   CBG (last 3)  Recent Labs (last 2 labs)       Recent Labs    06/06/22 1231 06/06/22 1604 06/06/22 2022  GLUCAP 280* 213* 155*       Paroxysmal atrial fibrillation.  Mali vas 2 score of greater than 4.   -Pt was on amiodarone and Eliquis combination but Amiodarone dced 01/19. Will continue Eliquis. Pt in NSR.    Hypertension.   Chronic. Normotensive this am. Placed on lopressor 25 mg BID. CTM.     Anemia of acute/critical illness along with some blood loss initially in ICU due to right groin hematoma at the site of arterial stick.  Stable for now. -Continue to monitor on Eliquis.   COPD and OSA.   Chronic. Supportive care nighttime CPAP.   Morbid obesity.  BMI 43.   Chronic. Follow-up with PCP for weight loss.   DVT prophylaxis: eLIQUIS Code Status: Full Family Communication: None at bedside Disposition:   Status is: Inpatient    Consultants:  PCCM Renal GI   Subjective:  No significant Events overnight, he denies any complaints today  Objective: Vitals:   06/09/22 0800 06/09/22 0820 06/09/22 1000 06/09/22 1200  BP:  119/67  118/75  Pulse: 83 85 83 84  Resp:  19    Temp:  97.9 F (36.6 C)  98.3 F (36.8 C)  TempSrc:  Oral  Oral  SpO2: 94% 93% 94% 95%  Weight:      Height:        Intake/Output Summary (Last 24 hours) at 06/09/2022 1425 Last data filed at 06/09/2022 1255 Gross per 24 hour  Intake --  Output 2100 ml  Net -2100 ml   Filed Weights   05/30/22 0400  06/08/22 0500 06/09/22 0500  Weight: (!) 161 kg (!) 153.4 kg (!) 152.5 kg    Examination:  Awake Alert, Oriented X 2, frail, deconditioned, chronically ill-appearing, appears older than stated age Symmetrical Chest wall movement, Good air movement bilaterally, diminished air entry at the bases RRR,No Gallops,Rubs or new Murmurs, No Parasternal Heave +ve B.Sounds, Abd Soft, No tenderness, No rebound - guarding or rigidity. No Cyanosis, Clubbing or edema, No new Rash or bruise      Data Reviewed: I have personally reviewed following labs and imaging studies  CBC: No results for input(s): "WBC", "NEUTROABS", "HGB", "HCT", "MCV", "PLT" in the last 168 hours.  Basic Metabolic Panel: Recent Labs  Lab 06/05/22 0302 06/06/22 0237 06/07/22 0725 06/08/22 0804 06/09/22 0455  NA 148* 145 139 143 140  K 4.2 3.8 3.8 3.9 3.8  CL 121* 118* 113* 115* 113*  CO2  18* 18* 18* 19* 20*  GLUCOSE 212* 273* 171* 163* 161*  BUN 60* 55* 53* 48* 39*  CREATININE 2.10* 1.93* 1.90* 2.21* 1.92*  CALCIUM 8.7* 8.5* 8.2* 8.2* 8.1*  MG 2.3 2.1 2.1 2.2 2.0    GFR: Estimated Creatinine Clearance: 63 mL/min (A) (by C-G formula based on SCr of 1.92 mg/dL (H)).  Liver Function Tests: Recent Labs  Lab 06/05/22 0302 06/06/22 0237 06/07/22 0725 06/08/22 0804 06/09/22 0455  AST 32 35 33 36 35  ALT 32 34 32 33 33  ALKPHOS 61 64 68 66 67  BILITOT 0.9 0.9 1.0 1.2 0.8  PROT 6.9 6.5 6.5 6.7 6.6  ALBUMIN 2.5* 2.5* 2.5* 2.6* 2.6*    CBG: Recent Labs  Lab 06/08/22 1135 06/08/22 1558 06/08/22 2119 06/09/22 0842 06/09/22 1257  GLUCAP 162* 176* 182* 141* 180*     No results found for this or any previous visit (from the past 240 hour(s)).       Radiology Studies: No results found.      Scheduled Meds:  apixaban  5 mg Oral BID   budesonide (PULMICORT) nebulizer solution  0.5 mg Nebulization BID   Chlorhexidine Gluconate Cloth  6 each Topical Daily   insulin aspart  0-15 Units Subcutaneous  TID WC   insulin aspart  0-5 Units Subcutaneous QHS   insulin glargine-yfgn  28 Units Subcutaneous Daily   levothyroxine  250 mcg Oral Q0600   metoCLOPramide  5 mg Oral TID AC & HS   metoprolol tartrate  25 mg Oral BID   [START ON 06/10/2022] multivitamin  1 tablet Oral Daily   mouth rinse  15 mL Mouth Rinse 4 times per day   sodium bicarbonate  650 mg Oral TID   Continuous Infusions:   LOS: 26 days       Phillips Climes, MD [facility]   To contact the attending provider between 7A-7P or the covering provider during after hours 7P-7A, please log into the web site [email or web address] and access using universal [place] password for that web site. If you do not have the password, please call the hospital operator.  06/09/2022, 2:25 PM

## 2022-06-09 NOTE — TOC Progression Note (Signed)
Transition of Care (TOC) - Progression Note    Patient Details  Name: Frank Shelton. MRN: [id number] Date of Birth: [birth year]  Transition of Care [facility]) CM/SW [place], LCSW Phone Number: 06/09/2022, 1:47 PM  Clinical Narrative:    CSW met with patient while he was eating lunch to discuss discharge plan. He stated he plans to go to rehab on the 4th floor. CSW explained that he may not qualify for CIR but will reach out for a determination. CSW asked him what his second option would be and he stated to go home with home health as he does not want to go to a SNF. CSW explained home health services and that they likely only come out 2-3 times a week and he reported understanding and that they live in [place]. CSW confirmed PCP. He requested CSW return once his parents arrive this afternoon.    Expected Discharge Plan:  (TBD) Barriers to Discharge: Continued Medical Work up  Expected Discharge Plan and Services In-house Referral: Clinical Social Work Discharge Planning Services: CM Consult Post Acute Care Choice: IP Rehab Living arrangements for the past 2 months: Single Family Home                                       Social Determinants of Health (SDOH) Interventions [place]: No Food Insecurity (05/12/2022)  Housing: Low Risk  (05/12/2022)  Transportation Needs: No Transportation Needs (05/12/2022)  Utilities: Not At Risk (05/12/2022)  Tobacco Use: Medium Risk (05/12/2022)    Readmission Risk Interventions     No data to display

## 2022-06-09 NOTE — Progress Notes (Signed)
Speech Language Pathology Treatment: Dysphagia  Patient Details Name: Frank Shelton. MRN: [id number] DOB: 1960/12/20 Today's Date: 06/09/2022 Time: 7416-3845 SLP Time Calculation (min) (ACUTE ONLY): 13 min  Assessment / Plan / Recommendation Clinical Impression  Frank Shelton has made excellent progress this week with regard to swallowing.  Today he drank thin liquids without s/s of aspiration. He consumed crackers with peanut butter and demonstrated normal oral attention and mastication; there was no oral residue post-swallow. There were no delays. Recommend advancing diet to regular solids, thin liquids; take meds whole in puree.  No F/U needed for swallowing, New orders written today for cognitive assessment.  Will follow.   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  Pt was intubated from 12/30-1/13.  CXR reported small right pleural effusion with probable adjacent right basilar  Atelectasis.      SLP Plan  All goals met      Recommendations for follow up therapy are one component of a multi-disciplinary discharge planning process, led by the attending physician.  Recommendations may be updated based on patient status, additional functional criteria and insurance authorization.    Recommendations  Diet recommendations: Regular;Thin liquid Liquids provided via: Cup;Straw Medication Administration: Whole meds with puree Supervision: Patient able to self feed                Oral Care Recommendations: Oral care BID Follow Up Recommendations: Skilled nursing-short term rehab (<3 hours/day) Assistance recommended at discharge: Frequent or constant Supervision/Assistance SLP Visit Diagnosis: Dysphagia, oropharyngeal phase (R13.12) Plan: All goals met         Frank Shelton L.  Frank Ringer, MA CCC/SLP Clinical Specialist - Acute Care SLP Acute Rehabilitation Services Office number [phone]   Frank Shelton  06/09/2022, 11:21 AM

## 2022-06-09 NOTE — PMR Pre-admission (Shared)
PMR Admission Coordinator Pre-Admission Assessment  Patient: Frank Shelton. is an 62 y.o., male MRN: [id number] DOB: June 07, 1960 Height: 6\' 4"  (193 cm) Weight: (!) 155.6 kg  Insurance Information HMO: yes    PPO:      PCP:      IPA:      80/20:      OTHER:  PRIMARY: [employer] Medicare      Policy#: [id number]      Subscriber: pt CM Name: ***      Phone#: [phone] ext ***     Fax#: [phone] Pre-Cert#: [id number]      Employer:  Benefits:  Phone #: [phone]     Name: 1/25 Eff. Date: 05/16/22     Deduct: none      Out of Pocket Max: $3650      Life Max: none CIR: $295 co pay per day days 1 until 7      SNF: $20 co pay per day days 1 until 20; $203 co copay per day days 21 until 100 Outpatient: $25 co pay per visit     Co-Pay: visits per medical neccesity Home Health: 100%      Co-Pay: visits per medical neccesity DME: 80%     Co-Pay: 20% Providers: in network  SECONDARY: none  Financial Counselor:       Phone#:   The Engineer, [employer]" for patients in Inpatient Rehabilitation Facilities with attached "Privacy Act [place] Records" was provided and verbally reviewed with: Patient and Family  Emergency Contact Information Contact Information     Name Relation Home Work Mobile   [place] Mother [phone]     [place], [employer] [phone]  [phone]   Kathlene November   [phone]      Current Medical History  Patient Admitting Diagnosis: Debility, post cardiac arrest and respiratory failure  History of Present Illness: 62 year old male with history of PE, DVT, DM, HTN , gout, COPD and OSA. Presented to the [facility] ED on 05/12/22 with complaints of difficulty breathing and chest pain similar to when he head a heart attack in 2021. Last dose of Xarelto was christmas eve of 05/08/22 and missed doses due to chaos of the holidays.   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 Patient  cardiac arrested in bathroom 12/30 and transferred to ICU where he was given Alteplase.  Treated for DKA, pressors and stress dose steroids. He went into AKI. PCCM and Nephrology consulted. Transferred to [facility] hospital on 05/18/22. He was intubated for several days and finally extubated on 05/28/22. He was treated for pneumonia with antibiotics. He has transitioned off cortrak feed and now on dysphagia 1 diet. Renal function improved with lasix. Added Seroquel and clonazepam.   TSH noted to be elevated and began levothyroxine. On Eliquis for his DVT and PE. Type 2 DM with insulin adjustments and SSI. Lopressor for HTN. Nighttime CPAP.   Patient's medical record from [facility] and  [facility] hospital has been reviewed by the rehabilitation admission coordinator and physician.  Past Medical History  Past Medical History:  Diagnosis Date   Arthritis    L shoulder & fingers    Bronchitis    during enviromental changes    Complication of anesthesia    Report of airway problem   Diabetes mellitus without complication (HCC)    Dyspnea    Embolism - blood clot    Gout    Headache  uses ibuprofen & tylenol & its relieved    Hypertension    Hypothyroidism    Myocardial infarction ([place]) 2012   OSA on CPAP    Peripheral vascular disease ([place])    Pulmonary embolism ([place])    Stroke [facility]) 2014   Has the patient had major surgery during 100 days prior to admission? No  Family History   family history includes Heart attack in his maternal grandfather, maternal grandmother, and mother; Heart attack (age of onset: 67) in his sister.  Current Medications  Current Facility-Administered Medications:    acetaminophen (TYLENOL) tablet 650 mg, 650 mg, Oral, Q6H PRN **OR** acetaminophen (TYLENOL) suppository 650 mg, 650 mg, Rectal, Q6H PRN, Elgergawy, Silver Huguenin, MD   albuterol (PROVENTIL) (2.5 MG/3ML) 0.083% nebulizer solution 2.5 mg, 2.5 mg, Nebulization, Q4H PRN, Barton Dubois, MD, 2.5  mg at 05/25/22 0528   apixaban (ELIQUIS) tablet 5 mg, 5 mg, Oral, BID, Elgergawy, Silver Huguenin, MD, 5 mg at 06/10/22 0828   budesonide (PULMICORT) nebulizer solution 0.5 mg, 0.5 mg, Nebulization, BID, Barton Dubois, MD, 0.5 mg at 06/10/22 9983   Chlorhexidine Gluconate Cloth 2 % PADS 6 each, 6 each, Topical, Daily, Barton Dubois, MD, 6 each at 06/10/22 0829   guaiFENesin (ROBITUSSIN) 100 MG/5ML liquid 5 mL, 5 mL, Oral, Q4H PRN, Thurnell Lose, MD, 5 mL at 06/10/22 0532   insulin aspart (novoLOG) injection 0-15 Units, 0-15 Units, Subcutaneous, TID WC, Idamae Schuller, MD, 3 Units at 06/10/22 0828   insulin aspart (novoLOG) injection 0-5 Units, 0-5 Units, Subcutaneous, QHS, Howerter, Justin B, DO   insulin glargine-yfgn (SEMGLEE) injection 28 Units, 28 Units, Subcutaneous, Daily, Idamae Schuller, MD, 28 Units at 06/10/22 3825   levothyroxine (SYNTHROID) tablet 250 mcg, 250 mcg, Oral, Q0600, Elgergawy, Silver Huguenin, MD, 250 mcg at 06/10/22 0532   metoCLOPramide (REGLAN) tablet 5 mg, 5 mg, Oral, TID AC & HS, Elgergawy, Silver Huguenin, MD, 5 mg at 06/10/22 1221   metoprolol tartrate (LOPRESSOR) tablet 25 mg, 25 mg, Oral, BID, Elgergawy, Silver Huguenin, MD, 25 mg at 06/10/22 0830   multivitamin (RENA-VIT) tablet 1 tablet, 1 tablet, Oral, Daily, Elgergawy, Silver Huguenin, MD, 1 tablet at 06/10/22 0827   Oral care mouth rinse, 15 mL, Mouth Rinse, 4 times per day, Kipp Brood, MD, 15 mL at 06/10/22 1222   Oral care mouth rinse, 15 mL, Mouth Rinse, PRN, Agarwala, Ravi, MD   sennosides (SENOKOT) 8.8 MG/5ML syrup 15 mL, 15 mL, Oral, QHS PRN, Elgergawy, Silver Huguenin, MD   sodium bicarbonate tablet 650 mg, 650 mg, Oral, TID, Elgergawy, Silver Huguenin, MD, 650 mg at 06/10/22 0830  Patients Current Diet:  Diet Order             Diet heart healthy/carb modified Room service appropriate? Yes with Assist; Fluid consistency: Thin  Diet effective now                  Precautions / Restrictions Precautions Precautions: Fall Precaution  Comments: cortrak Restrictions Weight Bearing Restrictions: No   Has the patient had 2 or more falls or a fall with injury in the past year? Yes  Prior Activity Level Community (5-7x/wk): Indpendent, unemployed  Prior Functional Level Self Care: Did the patient need help bathing, dressing, using the toilet or eating? Independent  Indoor Mobility: Did the patient need assistance with walking from room to room (with or without device)? Independent  Stairs: Did the patient need assistance with internal or external stairs (with or without device)? Independent  Functional Cognition: Did the patient need help planning regular tasks such as shopping or remembering to take medications? Independent  Patient Information Are you of Hispanic, Latino/a,or Spanish origin?: A. No, not of Hispanic, Latino/a, or Spanish origin What is your race?: A. White Do you need or want an interpreter to communicate with a doctor or health care staff?: 0. No  Patient's Response To:  Health Literacy and Transportation Is the patient able to respond to health literacy and transportation needs?: Yes Health Literacy - How often do you need to have someone help you when you read instructions, pamphlets, or other written material from your doctor or pharmacy?: Never In the past 12 months, has lack of transportation kept you from medical appointments or from getting medications?: No In the past 12 months, has lack of transportation kept you from meetings, work, or from getting things needed for daily living?: No  Home Assistive Devices / Wheeler Devices/Equipment: Radio producer (specify quad or straight), CPAP Home Equipment: Cane - single point  Prior Device Use: Indicate devices/aids used by the patient prior to current illness, exacerbation or injury? None of the above  Current Functional Level Cognition  Arousal/Alertness: Awake/alert Overall Cognitive Status: Impaired/Different from  baseline Difficult to assess due to: Impaired communication Current Attention Level: Sustained Orientation Level: Oriented X4 Following Commands: Follows one step commands consistently Safety/Judgement: Decreased awareness of deficits General Comments: pt more alert this morning, still having difficulty remembering why he is here and understanding his deficits Attention: Selective Selective Attention: Impaired Selective Attention Impairment: Verbal basic Memory: Impaired Memory Impairment: Storage deficit, Retrieval deficit Awareness: Impaired Awareness Impairment: Intellectual impairment Problem Solving: Impaired Problem Solving Impairment: Verbal basic, Functional basic Safety/Judgment: Impaired    Extremity Assessment (includes Sensation/Coordination)  Upper Extremity Assessment: Generalized weakness RUE Deficits / Details: MMT 3+/5 RUE Coordination: WNL LUE Deficits / Details: MMT 3+/5 LUE Coordination: WNL  Lower Extremity Assessment: Defer to PT evaluation    ADLs  Overall ADL's : Needs assistance/impaired Eating/Feeding: NPO Grooming: Min guard, Sitting Grooming Details (indicate cue type and reason): hand over hand to reach face Upper Body Bathing: Minimal assistance, Sitting Lower Body Bathing: Maximal assistance, Sitting/lateral leans Upper Body Dressing : Minimal assistance, Sitting Lower Body Dressing: Maximal assistance, Sitting/lateral leans Toilet Transfer: Total assistance Toileting- Clothing Manipulation and Hygiene: Maximal assistance, Bed level Functional mobility during ADLs: Total assistance General ADL Comments: +3 for bed mobility, hoyer to the chair. hand over hand for participation    Mobility  Overal bed mobility: Needs Assistance Bed Mobility: Supine to Sit Rolling: Mod assist, +2 for physical assistance Supine to sit: Mod assist, HOB elevated Sit to supine: Mod assist General bed mobility comments: assist for trunk and pulling up, pt  utilizing grab bars, cueing required    Transfers  Overall transfer level: Needs assistance Equipment used: Ambulation equipment used Transfers: Sit to/from Stand Sit to Stand: Max assist, From elevated surface Bed to/from chair/wheelchair/BSC transfer type:: Via Lift equipment Stand pivot transfers: Max assist, +2 physical assistance, From elevated surface Transfer via Lift Equipment: Stedy General transfer comment: modAx2 for 3 sit<>stand trials today from elevated surface, requiring maxAx2 and increased cues for technique and sequencing required for stand-pivot transfer to chair. Pt left in chair with maximove sling underneath    Ambulation / Gait / Stairs / Wheelchair Mobility       Posture / Balance Dynamic Sitting Balance Sitting balance - Comments: able to sit EOB with min guard to supervision, initial R lateral lean  but corrected with cues and squaring hips Balance Overall balance assessment: Needs assistance Sitting-balance support: Feet supported, Single extremity supported Sitting balance-Leahy Scale: Fair Sitting balance - Comments: able to sit EOB with min guard to supervision, initial R lateral lean but corrected with cues and squaring hips Postural control: Right lateral lean Standing balance support: Bilateral upper extremity supported, During functional activity Standing balance-Leahy Scale: Zero Standing balance comment: pt improved in ability to maintain static standing with modAx2 and BUE support, B knee buckling but tolerating longer today, maxAx2 required for functional mobility with stand pivot transfer    Special needs/care consideration CPAP at HS bariatric   Previous Home Environment  Living Arrangements:  (58 yo son lives with patient)  Lives With: Son Available Help at Discharge: Family, Available 24 hours/day (will d/c to parents home; they and other 74 yo son and sister can assist) Type of Home: House Home Layout: One level Home Access: Stairs to  enter Entrance Stairs-Rails: None Entrance Stairs-Number of Steps: 2 Bathroom Shower/Tub: Chiropodist: Standard Bathroom Accessibility: Yes How Accessible: Accessible via walker Home Care Services: No  Discharge Living Setting Plans for Discharge Living Setting: Lives with (comment) (to d/c home to parents home) Type of Home at Discharge: Mobile home Discharge Home Layout: One level Discharge Home Access: Stairs to enter Entrance Stairs-Rails: Right, Left, Can reach both Entrance Stairs-Number of Steps: 3 Discharge Bathroom Shower/Tub: Tub/shower unit Discharge Bathroom Toilet: Standard Discharge Bathroom Accessibility: No Does the patient have any problems obtaining your medications?: No  Social/Family/Support Systems Patient Roles: Parent Contact Information: Lona Millard Anticipated Caregiver: Parents, 70 yo son and sister Anticipated Caregiver's Contact Information: see contacts Ability/Limitations of Caregiver: supervision to light min assist Caregiver Availability: 24/7 Discharge Plan Discussed with Primary Caregiver: Yes Is Caregiver In Agreement with Plan?: Yes Does Caregiver/Family have Issues with Lodging/Transportation while Pt is in Rehab?: No  Goals Patient/Family Goal for Rehab: Mod I to supervision at wheelchair level PT, supervison to min OT, supervision SLP Expected length of stay: ELOS 14 to 20  days Pt/Family Agrees to Admission and willing to participate: Yes Program Orientation Provided & Reviewed with Pt/Caregiver Including Roles  & Responsibilities: Yes  Decrease burden of Care through IP rehab admission: n/a  Possible need for SNF placement upon discharge: patient and family refuse SNF  Patient Condition: I have reviewed medical records from [facility] and [place], spoken with CSW, and patient and family member. I met with patient at the bedside and discussed via phone for inpatient rehabilitation assessment.  Patient  will benefit from ongoing PT, OT, and SLP, can actively participate in 3 hours of therapy a day 5 days of the week, and can make measurable gains during the admission.  Patient will also benefit from the coordinated team approach during an Inpatient Acute Rehabilitation admission.  The patient will receive intensive therapy as well as Rehabilitation physician, nursing, social worker, and care management interventions.  Due to bladder management, bowel management, safety, skin/wound care, disease management, medication administration, pain management, and patient education the patient requires 24 hour a day rehabilitation nursing.  The patient is currently *** with mobility and basic ADLs.  Discharge setting and therapy post discharge at home with home health is anticipated.  Patient has agreed to participate in the Acute Inpatient Rehabilitation Program and will admit today.  Preadmission Screen Completed By: Danne Baxter RN MSN with updates by Julious Payer, Audelia Acton, 06/10/2022 1:32 PM ______________________________________________________________________   Discussed status with  Dr. Marland Kitchen on *** at *** and received approval for admission today.  Admission Coordinator: Ottie Glazier RN MSN with updates by Beckie Salts, Foye Spurling, RN, time Marland KitchenDorna Bloom ***   Assessment/Plan: Diagnosis: Does the need for close, 24 hr/day Medical supervision in concert with the patient's rehab needs make it unreasonable for this patient to be served in a less intensive setting? {yes_no_potentially:3041433} Co-Morbidities requiring supervision/potential complications: *** Due to {due UR:4270623}, does the patient require 24 hr/day rehab nursing? {yes_no_potentially:3041433} Does the patient require coordinated care of a physician, rehab nurse, PT, OT, and SLP to address physical and functional deficits in the context of the above medical diagnosis(es)? {yes_no_potentially:3041433} Addressing deficits in the  following areas: {deficits:3041436} Can the patient actively participate in an intensive therapy program of at least 3 hrs of therapy 5 days a week? {yes_no_potentially:3041433} The potential for patient to make measurable gains while on inpatient rehab is {potential:3041437} Anticipated functional outcomes upon discharge from inpatient rehab: {functional outcomes:304600100} PT, {functional outcomes:304600100} OT, {functional outcomes:304600100} SLP Estimated rehab length of stay to reach the above functional goals is: *** Anticipated discharge destination: {anticipated dc setting:21604} 10. Overall Rehab/Functional Prognosis: {potential:3041437}   MD Signature: ***

## 2022-06-09 NOTE — Progress Notes (Signed)
Physical Therapy Treatment Patient Details Name: Frank Shelton. MRN: [id number] DOB: 1960-10-09 Today's Date: 06/09/2022   History of Present Illness Pt is a 62 y.o male with h/o PE/DVT on long term Xarelto but missed a few doses over Christmas and presented 12/28 with recurrent PE in setting of  DVT >L> R on Dopplers with RV/LV 1.2 no change from CT 05-22-20 and echo ok but coded am 05-18-23.  Pt was intubated from 12/30-1/13.  CXR reported small right pleural effusion with probable adjacent right basilar Atelectasis. Significant PMH: PE/DVT, DM, HTN, gout, COPD, OSA.    PT Comments    Pt continues to progress towards his goals. Pt able to perform stand pivot transfer to chair today with maxAx2, performing 3 sit<>stand transfers with modAx2 and 2HHA. Pt requires cueing during stand pivot transfer for sequencing of task and movement of B feet. Pt fatigued after mobility and each sit<>stand but recovers with a seated rest break, VSS throughout on room air. Pt educated on importance of continued therapy, especially prior to going home. Acute PT will continue to follow to progress mobility and address deficits, discharge recommendations remains appropriate.     Recommendations for follow up therapy are one component of a multi-disciplinary discharge planning process, led by the attending physician.  Recommendations may be updated based on patient status, additional functional criteria and insurance authorization.  Follow Up Recommendations  Acute inpatient rehab (3hours/day)     Assistance Recommended at Discharge Frequent or constant Supervision/Assistance  Patient can return home with the following Two people to help with walking and/or transfers;Two people to help with bathing/dressing/bathroom;Assistance with cooking/housework;Direct supervision/assist for medications management;Direct supervision/assist for financial management;Assist for transportation;Help with stairs or ramp for entrance    Equipment Recommendations  Other (comment) (if pt goes home, would need hospital bed, mechanical lift, wheelchair, wheelchair cushion, BSC)    Recommendations for Other Services       Precautions / Restrictions Precautions Precautions: Fall Restrictions Weight Bearing Restrictions: No     Mobility  Bed Mobility Overal bed mobility: Needs Assistance Bed Mobility: Supine to Sit     Supine to sit: Mod assist, HOB elevated     General bed mobility comments: assist for trunk and pulling up, pt utilizing grab bars, cueing required    Transfers Overall transfer level: Needs assistance Equipment used: 2 person hand held assist Transfers: Sit to/from Stand, Bed to chair/wheelchair/BSC Sit to Stand: +2 physical assistance, From elevated surface, Mod assist Stand pivot transfers: Max assist, +2 physical assistance, From elevated surface         General transfer comment: modAx2 for 3 sit<>stand trials today from elevated surface, requiring maxAx2 and increased cues for technique and sequencing required for stand-pivot transfer to chair. Pt left in chair with maximove sling underneath    Ambulation/Gait                   Stairs             Wheelchair Mobility    Modified Rankin (Stroke Patients Only)       Balance Overall balance assessment: Needs assistance Sitting-balance support: Feet supported, Single extremity supported Sitting balance-Leahy Scale: Fair Sitting balance - Comments: able to sit EOB with min guard to supervision, initial R lateral lean but corrected with cues and squaring hips Postural control: Right lateral lean Standing balance support: Bilateral upper extremity supported, During functional activity Standing balance-Leahy Scale: Zero Standing balance comment: pt improved in ability to maintain static  standing with modAx2 and BUE support, B knee buckling but tolerating longer today, maxAx2 required for functional mobility with stand  pivot transfer                            Cognition Arousal/Alertness: Awake/alert Behavior During Therapy: WFL for tasks assessed/performed Overall Cognitive Status: Impaired/Different from baseline Area of Impairment: Awareness, Safety/judgement                       Following Commands: Follows one step commands consistently Safety/Judgement: Decreased awareness of safety, Decreased awareness of deficits Awareness: Intellectual Problem Solving: Slow processing, Requires verbal cues, Requires tactile cues General Comments: pt more alert this morning, still having difficulty remembering why he is here and understanding his deficits        Exercises      General Comments General comments (skin integrity, edema, etc.): VSS on room air      Pertinent Vitals/Pain Pain Assessment Pain Assessment: No/denies pain    Home Living     Available Help at Discharge: Family Type of Home: House                  Prior Function            PT Goals (current goals can now be found in the care plan section) Acute Rehab PT Goals Patient Stated Goal: to go home PT Goal Formulation: With patient/family Time For Goal Achievement: 06/12/22 Potential to Achieve Goals: Fair Progress towards PT goals: Progressing toward goals    Frequency    Min 3X/week      PT Plan Current plan remains appropriate    Co-evaluation              AM-PAC PT "6 Clicks" Mobility   Outcome Measure  Help needed turning from your back to your side while in a flat bed without using bedrails?: A Lot Help needed moving from lying on your back to sitting on the side of a flat bed without using bedrails?: A Lot Help needed moving to and from a bed to a chair (including a wheelchair)?: A Lot Help needed standing up from a chair using your arms (e.g., wheelchair or bedside chair)?: A Lot Help needed to walk in hospital room?: Total Help needed climbing 3-5 steps with a  railing? : Total 6 Click Score: 10    End of Session   Activity Tolerance: Patient limited by fatigue Patient left: with call bell/phone within reach;in chair;with chair alarm set Nurse Communication: Mobility status;Need for lift equipment PT Visit Diagnosis: Other abnormalities of gait and mobility (R26.89);Muscle weakness (generalized) (M62.81)     Time: 8250-5397 PT Time Calculation (min) (ACUTE ONLY): 38 min  Charges:  $Therapeutic Activity: 38-52 mins                     Charlynne Cousins, PT DPT Acute Rehabilitation Services Office [phone]    Luvenia Heller 06/09/2022, 12:21 PM

## 2022-06-09 NOTE — Progress Notes (Signed)
  Inpatient Rehabilitation Admissions Coordinator   Met with patient, Mom, and Dad at bedside as well as sister by phone for rehab assessment. We discussed goals and expectations of a possible CIR admit. They prefer CIR for rehab. Plans are for patient to return home with parents and family can provide 24/7 supervision to light min assist level . Their goals are that he will be able to ambulate with RW short distances at home and they can accommodate assisting with ADLS, etc.  I will begin insurance Auth with [facility] Medicare for possible CIR admit pending approval. Please call me with any questions.   Danne Baxter, RN, MSN Rehab Admissions Coordinator [phone]

## 2022-06-09 NOTE — Evaluation (Signed)
Speech Language Pathology Evaluation Patient Details Name: Frank Shelton. MRN: [id number] DOB: [birth year] Today's Date: 06/09/2022 Time: 0865-7846 SLP Time Calculation (min) (ACUTE ONLY): 28 min  Problem List:  Patient Active Problem List   Diagnosis Date Noted   Severe sepsis ([place]) 05/27/2022   AKI (acute kidney injury) ([place]) 05/26/2022   Encephalopathy acute 05/26/2022   Acute respiratory failure ([place]) 05/25/2022   Enterobacter cloacae pneumonia 05/25/2022   Septic shock ([place]) 05/25/2022   Acute respiratory failure with hypoxia and hypercapnia (HCC) 05/22/2022   Shock ([place]) 05/22/2022   Coffee ground emesis 05/16/2022   Upper GI bleed 05/15/2022   Pulmonary embolism ([place]) 05/12/2022   COVID-19 virus infection 05/17/2020   Hyperglycemia 05/16/2020   Varicose veins of bilateral lower extremities with other complications 96/29/5284   Postsurgical hypothyroidism 05/04/2017   Multiple thyroid nodules 01/26/2017   DM type 2 causing vascular disease ([place]) 01/10/2017   Primary osteoarthritis of both first carpometacarpal joints 02/27/2014   Atypical chest pain 01/09/2014   Hypercoagulopathy ([place]) 01/09/2014   DVT (deep venous thrombosis) ([place]) 01/12/2012   Cellulitis of left lower extremity 01/12/2012   ASTHMA 08/02/2010   NUMBNESS, HAND 08/02/2010   GOUT 07/30/2010   Morbid obesity ([place]) 07/30/2010   OBSTRUCTIVE SLEEP APNEA 07/30/2010   RESTLESS LEG SYNDROME 07/30/2010   Essential hypertension 07/30/2010   ALLERGIC RHINITIS 07/30/2010   Other specified chronic obstructive pulmonary disease 07/30/2010   GERD 07/30/2010   PULMONARY EMBOLISM, HX OF 07/30/2010   GASTROINTESTINAL XRAY, ABNORMAL 09/14/2009   OTHER DYSPHAGIA 08/24/2009   Past Medical History:  Past Medical History:  Diagnosis Date   Arthritis    L shoulder & fingers    Bronchitis    during enviromental changes    Complication of anesthesia    Report of airway problem   Diabetes mellitus without  complication (HCC)    Dyspnea    Embolism - blood clot    Gout    Headache    uses ibuprofen & tylenol & its relieved    Hypertension    Hypothyroidism    Myocardial infarction ([place]) 2012   OSA on CPAP    Peripheral vascular disease ([place])    Pulmonary embolism ([place])    Stroke ([place]) 2014   Past Surgical History:  Past Surgical History:  Procedure Laterality Date   HELLER MYOTOMY  2014   [place]   IVC FILTER INSERTION  2010   ROTATOR CUFF REPAIR Right    SKIN GRAFT     x3- to face., child    THYROIDECTOMY N/A 03/09/2017   Procedure: TOTAL THYROIDECTOMY;  Surgeon: Armandina Gemma, MD;  Location: [facility];  Service: General;  Laterality: N/A;   TOTAL THYROIDECTOMY  03/09/2017   HPI:  Pt is a 62 y.o male admitted 12/28 with recurrent PE in setting of  DVT >L> R. Suffered cardiac arrest with CPR required 2/30.  Pt was intubated from 12/30-1/13.  PMH: PE/DVT, DM, HTN, gout, COPD, OSA.   Assessment / Plan / Recommendation Clinical Impression  Frank Shelton presents with cognitive deficits related to attention, memory, judgment, and problem-solving, He participated in the [place] ([place] Mental Status Exam), scoring a 17/30 and losing points in the subsections related to memory, mental calculations, and clock-drawing.  He demonstrated some awareness of deficits but was unable to self-correct.  Recommend beginning cognitive therapy to address the deficits above.  Pt will benefit from intervention at next level of care.    SLP Assessment  SLP  Recommendation/Assessment: Patient needs continued Speech Lanaguage Pathology Services SLP Visit Diagnosis: Cognitive communication deficit [phone])    Recommendations for follow up therapy are one component of a multi-disciplinary discharge planning process, led by the attending physician.  Recommendations may be updated based on patient status, additional functional criteria and insurance authorization.    Follow Up Recommendations   Skilled nursing-short term rehab (<3 hours/day)    Assistance Recommended at Discharge  Frequent or constant Supervision/Assistance  Functional Status Assessment Patient has had a recent decline in their functional status and demonstrates the ability to make significant improvements in function in a reasonable and predictable amount of time.  Frequency and Duration min 2x/week  2 weeks      SLP Evaluation Cognition  Overall Cognitive Status: Impaired/Different from baseline Arousal/Alertness: Awake/alert Orientation Level: Oriented to person;Oriented to place;Oriented to time;Disoriented to situation Attention: Selective Selective Attention: Impaired Selective Attention Impairment: Verbal basic Memory: Impaired Memory Impairment: Storage deficit;Retrieval deficit Awareness: Impaired Awareness Impairment: Intellectual impairment Problem Solving: Impaired Problem Solving Impairment: Verbal basic;Functional basic Safety/Judgment: Impaired       Comprehension  Auditory Comprehension Overall Auditory Comprehension: Appears within functional limits for tasks assessed Reading Comprehension Reading Status: Within funtional limits    Expression Expression Primary Mode of Expression: Verbal Verbal Expression Overall Verbal Expression: Appears within functional limits for tasks assessed Written Expression Dominant Hand: Right   Oral / Motor  Oral Motor/Sensory Function Overall Oral Motor/Sensory Function: Within functional limits Motor Speech Overall Motor Speech: Appears within functional limits for tasks assessed            Frank Shelton Laurice 06/09/2022, 12:20 PM Frank Shelton L. Tivis Ringer, MA CCC/SLP Clinical Specialist - [place] Office number [phone]

## 2022-06-10 DIAGNOSIS — I10 Essential (primary) hypertension: Secondary | ICD-10-CM | POA: Diagnosis not present

## 2022-06-10 DIAGNOSIS — I2609 Other pulmonary embolism with acute cor pulmonale: Secondary | ICD-10-CM | POA: Diagnosis not present

## 2022-06-10 DIAGNOSIS — N179 Acute kidney failure, unspecified: Secondary | ICD-10-CM | POA: Diagnosis not present

## 2022-06-10 LAB — GLUCOSE, CAPILLARY
Glucose-Capillary: 156 mg/dL — ABNORMAL HIGH (ref 70–99)
Glucose-Capillary: 117 mg/dL — ABNORMAL HIGH (ref 70–99)
Glucose-Capillary: 139 mg/dL — ABNORMAL HIGH (ref 70–99)
Glucose-Capillary: 138 mg/dL — ABNORMAL HIGH (ref 70–99)

## 2022-06-10 LAB — COMPREHENSIVE METABOLIC PANEL
ALT: 36 U/L (ref 0–44)
AST: 38 U/L (ref 15–41)
Albumin: 2.7 g/dL — ABNORMAL LOW (ref 3.5–5.0)
Alkaline Phosphatase: 68 U/L (ref 38–126)
Anion gap: 8 (ref 5–15)
BUN: 35 mg/dL — ABNORMAL HIGH (ref 8–23)
CO2: 18 mmol/L — ABNORMAL LOW (ref 22–32)
Calcium: 8.3 mg/dL — ABNORMAL LOW (ref 8.9–10.3)
Chloride: 111 mmol/L (ref 98–111)
Creatinine, Ser: 1.95 mg/dL — ABNORMAL HIGH (ref 0.61–1.24)
GFR, Estimated: 38 mL/min — ABNORMAL LOW (ref >60)
Glucose, Bld: 162 mg/dL — ABNORMAL HIGH (ref 70–99)
Potassium: 3.7 mmol/L (ref 3.5–5.1)
Sodium: 137 mmol/L (ref 135–145)
Total Bilirubin: 0.8 mg/dL (ref 0.3–1.2)
Total Protein: 6.8 g/dL (ref 6.5–8.1)

## 2022-06-10 LAB — CBC
HCT: 32.8 % — ABNORMAL LOW (ref 39.0–52.0)
Hemoglobin: 10.1 g/dL — ABNORMAL LOW (ref 13.0–17.0)
MCH: 30.8 pg (ref 26.0–34.0)
MCHC: 30.8 g/dL (ref 30.0–36.0)
MCV: 100 fL (ref 80.0–100.0)
Platelets: 245 10*3/uL (ref 150–400)
RBC: 3.28 MIL/uL — ABNORMAL LOW (ref 4.22–5.81)
RDW: 20 % — ABNORMAL HIGH (ref 11.5–15.5)
WBC: 6.4 10*3/uL (ref 4.0–10.5)
nRBC: 0 % (ref 0.0–0.2)

## 2022-06-10 MED ORDER — SODIUM BICARBONATE 650 MG PO TABS
650.0000 mg | ORAL_TABLET | Freq: Three times a day (TID) | ORAL | Status: DC
  Administered 2022-06-10 – 2022-06-14 (×12): 650 mg via ORAL
  Filled 2022-06-10 (×12): qty 1

## 2022-06-10 NOTE — Progress Notes (Signed)
PROGRESS NOTE    Frank Shelton.  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  He had some bleeding from his groin at the site of arterial stick in ICU, he also went into AKI.  He was seen by [facility] and nephrology, he was intubated for several days finally extubated on 05/28/2022, he also developed pneumonia for which she finished on antibiotics on 05/29/2022.  He is currently diffusely weak, has NG tube for feeding. He was transferred to hospitalist service on 05/30/2022, diffusely weak, bedbound with NG tube feed dependence.  with  severe hypernatremia with improving renal function.  Clinically improved, tolerating heart/carb modified diet, renal function much improved, and hypernatremia has resolved.   Significant Hospital Events: 05/26/2023: Cardiac arrest while in BR. CPR  initiated. TNK administered during CPR, fem cvl  placed/ post arrest shock, high dose pressors. Failed attempt on right femoral and right IJ access. Time to ROSC estimated over 30 minutes. Hgb dropped 12 to 8.8  12/31 on 3 pressors and stress dose steroids. In DKA. Insulin gtt started. LMWH placed on hold.  MRSA PCR screen 05/26/2023 > neg  BC x 2 1/1  > one /two pos gpc + staph hominis. Unasyn started, subsequently was switched to meropenem in ICU finished all his antibiotics on 05/29/2022. 1/3 transferred to [facility] central venous catheter removed.  Blood cultures repeated.  Repeat echo: EF 50 to 55% no regional wall abnormality grade 1 diastolic dysfunction RV normal Renal function improving with Lasix.  Weaning pressors.  Adding Seroquel  and clonazepam to assist with weaning 1/7 ETT exchange, bronch for ongoing hemoptysis 1/10 no hemoptysis; resuming heparin today; off levo 1/11 starting to wake up a bit, nephro decr lasix and incr FWF   1/12 more mucus, having incr peaks, changing sedation  1/13 finally awake following commands off vasopressors passed SBT.  Trial of extubation. 1/15 transferred to [facility]. 1/16 TSH elevated at 55, started on IV levothyroxine.  1/19 Pro cal at 0.14, CRP down to 1.4, BNP normal. TSH still elevated at 57, FT4 normal 1/23 NG tube clamped. D1D started. 1/25 he was advanced to heart/carb modified diet, NG tube is discontinued.    Assessment & Plan:  Principal Problem:   Pulmonary embolism (HCC) Active Problems:   GOUT   OBSTRUCTIVE SLEEP APNEA   Essential hypertension   Other specified chronic obstructive pulmonary disease   Hypercoagulopathy (HCC)   DM type 2 causing vascular disease (HCC)   Upper GI bleed   Coffee ground emesis   Acute respiratory failure with hypoxia and hypercapnia (HCC)   Shock ([place])   Acute respiratory failure ([place])   Enterobacter cloacae pneumonia   Septic shock (HCC)   AKI (acute kidney injury) ([place])   Encephalopathy acute   Severe sepsis ([place])  Acute PE in a patient with hypercoagulable state who was noncompliant with his Xarelto for a few days at home. Complicated by PEA arrest.  He has been treated in ICU with Altepase and anticoagulation, required endotracheal intubation and mechanical ventilation for several days, remained intubated for  close to 10 days and finally extubated on 05/28/2022.  Also developed hospital/ventilator acquired pneumonia for which she has finished all his antibiotics on 05/29/2022. Is currently on oral Eliquis for his DVT PE. Satting well on RA. Hemodynamically stable and improving.   Myxedema with history of hypothyroidism   Toxic and metabolic encephalopathy -Mental status much improved, no focal deficits, but remains generally  weak and deconditioned -Initially requiring IV Synthroid, will transition to oral Synthroid today. -NG tube was discontinued  -Likely some encephalopathy due to his arrest, will request cognition evaluation by SLP. -As well he had PEA arrest, SLP evaluation for cognitive dysfunction, he scored 17/3, continue with cognition therapy.  Dysphagia -Much improved, advanced to heart/carb   AKI, dehydration and hypernatremia.  Seen by nephrology. Overall improving.  - Intravascularly appears dehydrated, holding lasix. Sodium normalized.  -Started on oral bicarb given low bicarb level  DM type II.   -Continue with Lantus and insulin sliding scale   CBG (last 3)  Recent Labs (last 2 labs)       Recent Labs    06/06/22 1231 06/06/22 1604 06/06/22 2022  GLUCAP 280* 213* 155*       Paroxysmal atrial fibrillation.  [place] vas 2 score of greater than 4.   -Pt was on amiodarone and Eliquis combination but Amiodarone dced 01/19. Will continue Eliquis. Pt in NSR.  He remains on metoprolol.   Hypertension.   Chronic. Normotensive this am. Placed on lopressor 25 mg BID. CTM.     Anemia of acute/critical illness along with some blood loss initially in ICU due to right groin hematoma at the site of arterial stick.  Stable for now. -Continue to monitor on Eliquis.   COPD and OSA.   Chronic. Supportive care nighttime CPAP.   Morbid obesity.  BMI 43.   Chronic. Follow-up with PCP for weight loss.   DVT prophylaxis: eLIQUIS Code Status: Full Family Communication: None at bedside Disposition: He will benefit for CIR as he will need more PT/OT and cognitive therapy  Status is: Inpatient    Consultants:  PCCM Renal GI   Subjective:  No significant Events overnight, he denies any complaints today  Objective: Vitals:   06/10/22 0700 06/10/22 0800 06/10/22 0905 06/10/22 1226  BP: 115/70 115/70  117/83  Pulse:      Resp:  19    Temp: 98.9 F (37.2 C)   98 F (36.7 C)  TempSrc:     Oral  SpO2:   95% 100%  Weight:      Height:        Intake/Output Summary (Last 24 hours) at 06/10/2022 1308 Last data filed at 06/10/2022 1226 Gross per 24 hour  Intake --  Output 3150 ml  Net -3150 ml   Filed Weights   06/08/22 0500 06/09/22 0500 06/10/22 0500  Weight: (!) 153.4 kg (!) 152.5 kg (!) 155.6 kg    Examination:  Awake Alert, is not, in no apparent distress Symmetrical Chest wall movement, Good air movement bilaterally, CTAB RRR,No Gallops,Rubs or new Murmurs, No Parasternal Heave +ve B.Sounds, Abd Soft, No tenderness, No rebound - guarding or rigidity. No Cyanosis, Clubbing or edema, No new Rash or bruise        Data Reviewed: I have personally reviewed following labs and imaging studies  CBC: Recent Labs  Lab 06/10/22 0645  WBC 6.4  HGB 10.1*  HCT 32.8*  MCV 100.0  PLT 245    Basic Metabolic Panel: Recent Labs  Lab 06/05/22  0302 06/06/22 0237 06/07/22 0725 06/08/22 0804 06/09/22 0455 06/10/22 0645  NA 148* 145 139 143 140 137  K 4.2 3.8 3.8 3.9 3.8 3.7  CL 121* 118* 113* 115* 113* 111  CO2 18* 18* 18* 19* 20* 18*  GLUCOSE 212* 273* 171* 163* 161* 162*  BUN 60* 55* 53* 48* 39* 35*  CREATININE 2.10* 1.93* 1.90* 2.21* 1.92* 1.95*  CALCIUM 8.7* 8.5* 8.2* 8.2* 8.1* 8.3*  MG 2.3 2.1 2.1 2.2 2.0  --     GFR: Estimated Creatinine Clearance: 62.7 mL/min (A) (by C-G formula based on SCr of 1.95 mg/dL (H)).  Liver Function Tests: Recent Labs  Lab 06/06/22 0237 06/07/22 0725 06/08/22 0804 06/09/22 0455 06/10/22 0645  AST 35 33 36 35 38  ALT 34 32 33 33 36  ALKPHOS 64 68 66 67 68  BILITOT 0.9 1.0 1.2 0.8 0.8  PROT 6.5 6.5 6.7 6.6 6.8  ALBUMIN 2.5* 2.5* 2.6* 2.6* 2.7*    CBG: Recent Labs  Lab 06/09/22 1257 06/09/22 1642 06/09/22 2132 06/10/22 0800 06/10/22 1231  GLUCAP 180* 140* 194* 156* 117*     No results found for this or any previous visit (from the past 240 hour(s)).       Radiology Studies: No results  found.      Scheduled Meds:  apixaban  5 mg Oral BID   budesonide (PULMICORT) nebulizer solution  0.5 mg Nebulization BID   Chlorhexidine Gluconate Cloth  6 each Topical Daily   insulin aspart  0-15 Units Subcutaneous TID WC   insulin aspart  0-5 Units Subcutaneous QHS   insulin glargine-yfgn  28 Units Subcutaneous Daily   levothyroxine  250 mcg Oral Q0600   metoCLOPramide  5 mg Oral TID AC & HS   metoprolol tartrate  25 mg Oral BID   multivitamin  1 tablet Oral Daily   mouth rinse  15 mL Mouth Rinse 4 times per day   sodium bicarbonate  650 mg Oral TID   Continuous Infusions:   LOS: 27 days       Phillips Climes, MD [facility]   To contact the attending provider between 7A-7P or the covering provider during after hours 7P-7A, please log into the web site [email or web address] and access using universal [place] password for that web site. If you do not have the password, please call the hospital operator.  06/10/2022, 1:08 PM

## 2022-06-10 NOTE — Care Management Important Message (Signed)
Important Message  Patient Details  Name: Frank Shelton. MRN: [id number] Date of Birth: 1960/11/08   Medicare Important Message Given:  Yes     Hannah Beat 06/10/2022, 3:19 PM

## 2022-06-10 NOTE — Progress Notes (Signed)
Occupational Therapy Treatment Patient Details Name: Frank Shelton. 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  Pt was intubated from 12/30-1/13.  CXR reported small right pleural effusion with probable adjacent right basilar Atelectasis. Significant PMH: PE/DVT, DM, HTN, gout, COPD, OSA.   OT comments  Patient continues with fair progress toward patient focused goals.  Able to use stedy this date for bed to chair transfer.  Patient able to stand from elevated surface times two.  Mild dizziness and nausea noted.  OT to continue efforts and AIR continues to follow for post acute rehab.  Patient demonstrates ability to reach Mod I level from wheelchair level with intense rehab.  Patient is at a pont where a transition to a more aggressive consistent rehab is needed.     Recommendations for follow up therapy are one component of a multi-disciplinary discharge planning process, led by the attending physician.  Recommendations may be updated based on patient status, additional functional criteria and insurance authorization.    Follow Up Recommendations  Acute inpatient rehab (3hours/day)     Assistance Recommended at Discharge Frequent or constant Supervision/Assistance  Patient can return home with the following  A lot of help with bathing/dressing/bathroom;Direct supervision/assist for medications management;Direct supervision/assist for financial management;Assist for transportation;Help with stairs or ramp for entrance;Two people to help with walking and/or transfers   Equipment Recommendations  None recommended by OT    Recommendations for Other Services      Precautions / Restrictions  Precautions Precautions: Fall Restrictions Weight Bearing Restrictions: No       Mobility Bed Mobility Overal bed mobility: Needs Assistance Bed Mobility: Supine to Sit     Supine to sit: Mod assist, HOB elevated          Transfers Overall transfer level: Needs assistance Equipment used: Ambulation equipment used Transfers: Sit to/from Stand Sit to Stand: Max assist, From elevated surface             Transfer via Lift Equipment: Stedy   Balance Overall balance assessment: Needs assistance Sitting-balance support: Feet supported, Single extremity supported Sitting balance-Leahy Scale: Fair                                     ADL either performed or assessed with clinical judgement   ADL                                              Extremity/Trunk Assessment Upper Extremity Assessment Upper Extremity Assessment: Generalized weakness RUE Deficits / Details: MMT 3+/5 RUE Coordination: WNL LUE Deficits / Details: MMT 3+/5 LUE Coordination: WNL   Lower Extremity Assessment Lower Extremity Assessment: Defer to PT evaluation   Cervical / Trunk Assessment Cervical / Trunk Assessment: Other exceptions Cervical / Trunk Exceptions: increased body habitus    Vision       Perception     Praxis      Cognition Arousal/Alertness: Awake/alert Behavior During Therapy: WFL for tasks assessed/performed  Overall Cognitive Status: Impaired/Different from baseline                           Safety/Judgement: Decreased awareness of deficits   Problem Solving: Slow processing, Requires verbal cues, Requires tactile cues          Exercises      Shoulder Instructions       General Comments      Pertinent Vitals/ Pain       Pain Assessment Pain Assessment: No/denies pain Pain Intervention(s): Monitored during session                                                          Frequency   Min 2X/week        Progress Toward Goals  OT Goals(current goals can now be found in the care plan section)  Progress towards OT goals: Progressing toward goals  Acute Rehab OT Goals OT Goal Formulation: With patient Time For Goal Achievement: 06/20/22 Potential to Achieve Goals: [place] Discharge plan remains appropriate    Co-evaluation                 AM-PAC OT "6 Clicks" Daily Activity     Outcome Measure   Help from another person eating meals?: None Help from another person taking care of personal grooming?: None Help from another person toileting, which includes using toliet, bedpan, or urinal?: A Lot Help from another person bathing (including washing, rinsing, drying)?: A Lot Help from another person to put on and taking off regular upper body clothing?: A Little Help from another person to put on and taking off regular lower body clothing?: A Lot 6 Click Score: 17    End of Session    OT Visit Diagnosis: Unsteadiness on feet (R26.81);Muscle weakness (generalized) (M62.81)   Activity Tolerance Patient tolerated treatment well   Patient Left in chair;with call bell/phone within reach;with chair alarm set   Nurse Communication Mobility status;Need for lift equipment        Time: 1200-1223 OT Time Calculation (min): 23 min  Charges: OT General Charges $OT Visit: 1 Visit OT Treatments $Self Care/Home Management : 23-37 mins  06/10/2022  RP, OTR/L  Acute Rehabilitation Services  Office:  [phone]   Metta Clines 06/10/2022, 12:55 PM

## 2022-06-10 NOTE — Progress Notes (Addendum)
  Inpatient Rehabilitation Admissions Coordinator   I await insurance Auth and bed availability to admit to CIR. I met at bedside with patient, parents and son ,Peanut. We reviewed estimated cost of care if CIR approved by payor.  Danne Baxter, RN, MSN Rehab Admissions Coordinator [phone] 06/10/2022 12:32 PM

## 2022-06-11 DIAGNOSIS — I2609 Other pulmonary embolism with acute cor pulmonale: Secondary | ICD-10-CM | POA: Diagnosis not present

## 2022-06-11 DIAGNOSIS — N179 Acute kidney failure, unspecified: Secondary | ICD-10-CM | POA: Diagnosis not present

## 2022-06-11 LAB — CBC
HCT: 31.7 % — ABNORMAL LOW (ref 39.0–52.0)
Hemoglobin: 10.2 g/dL — ABNORMAL LOW (ref 13.0–17.0)
MCH: 31.3 pg (ref 26.0–34.0)
MCHC: 32.2 g/dL (ref 30.0–36.0)
MCV: 97.2 fL (ref 80.0–100.0)
Platelets: 247 10*3/uL (ref 150–400)
RBC: 3.26 MIL/uL — ABNORMAL LOW (ref 4.22–5.81)
RDW: 19.9 % — ABNORMAL HIGH (ref 11.5–15.5)
WBC: 6.7 10*3/uL (ref 4.0–10.5)
nRBC: 0 % (ref 0.0–0.2)

## 2022-06-11 LAB — GLUCOSE, CAPILLARY
Glucose-Capillary: 137 mg/dL — ABNORMAL HIGH (ref 70–99)
Glucose-Capillary: 123 mg/dL — ABNORMAL HIGH (ref 70–99)
Glucose-Capillary: 148 mg/dL — ABNORMAL HIGH (ref 70–99)
Glucose-Capillary: 158 mg/dL — ABNORMAL HIGH (ref 70–99)
Glucose-Capillary: 135 mg/dL — ABNORMAL HIGH (ref 70–99)

## 2022-06-11 LAB — BASIC METABOLIC PANEL
Anion gap: 6 (ref 5–15)
BUN: 31 mg/dL — ABNORMAL HIGH (ref 8–23)
CO2: 19 mmol/L — ABNORMAL LOW (ref 22–32)
Calcium: 8.5 mg/dL — ABNORMAL LOW (ref 8.9–10.3)
Chloride: 112 mmol/L — ABNORMAL HIGH (ref 98–111)
Creatinine, Ser: 1.78 mg/dL — ABNORMAL HIGH (ref 0.61–1.24)
GFR, Estimated: 42 mL/min — ABNORMAL LOW (ref >60)
Glucose, Bld: 141 mg/dL — ABNORMAL HIGH (ref 70–99)
Potassium: 3.5 mmol/L (ref 3.5–5.1)
Sodium: 137 mmol/L (ref 135–145)

## 2022-06-11 NOTE — Progress Notes (Signed)
PROGRESS NOTE    Frank Shelton.  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  He had some bleeding from his groin at the site of arterial stick in ICU, he also went into AKI.  He was seen by [facility] and nephrology, he was intubated for several days finally extubated on 05/28/2022, he also developed pneumonia for which she finished on antibiotics on 05/29/2022.  He is currently diffusely weak, has NG tube for feeding. He was transferred to hospitalist service on 05/30/2022, diffusely weak, bedbound with NG tube feed dependence.  with  severe hypernatremia with improving renal function.  Clinically improved, tolerating heart/carb modified diet, renal function much improved, and hypernatremia has resolved.   Significant Hospital Events: 05/23/23: Cardiac arrest while in BR. CPR  initiated. TNK administered during CPR, fem cvl  placed/ post arrest shock, high dose pressors. Failed attempt on right femoral and right IJ access. Time to ROSC estimated over 30 minutes. Hgb dropped 12 to 8.8  12/31 on 3 pressors and stress dose steroids. In DKA. Insulin gtt started. LMWH placed on hold.  MRSA PCR screen May 23, 2023 > neg  BC x 2 1/1  > one /two pos gpc + staph hominis. Unasyn started, subsequently was switched to meropenem in ICU finished all his antibiotics on 05/29/2022. 1/3 transferred to [facility] central venous catheter removed.  Blood cultures repeated.  Repeat echo: EF 50 to 55% no regional wall abnormality grade 1 diastolic dysfunction RV normal Renal function improving with Lasix.  Weaning pressors.  Adding Seroquel  and clonazepam to assist with weaning 1/7 ETT exchange, bronch for ongoing hemoptysis 1/10 no hemoptysis; resuming heparin today; off levo 1/11 starting to wake up a bit, nephro decr lasix and incr FWF   1/12 more mucus, having incr peaks, changing sedation  1/13 finally awake following commands off vasopressors passed SBT.  Trial of extubation. 1/15 transferred to [facility]. 1/16 TSH elevated at 55, started on IV levothyroxine.  1/19 Pro cal at 0.14, CRP down to 1.4, BNP normal. TSH still elevated at 57, FT4 normal 1/23 NG tube clamped. D1D started. 1/25 he was advanced to heart/carb modified diet, NG tube is discontinued.  Has been compliant and improving with physical therapy    Assessment & Plan:  Principal Problem:   Pulmonary embolism (HCC) Active Problems:   GOUT   OBSTRUCTIVE SLEEP APNEA   Essential hypertension   Other specified chronic obstructive pulmonary disease   Hypercoagulopathy (HCC)   DM type 2 causing vascular disease (HCC)   Upper GI bleed   Coffee ground emesis   Acute respiratory failure with hypoxia and hypercapnia (HCC)   Shock ([place])   Acute respiratory failure ([place])   Enterobacter cloacae pneumonia   Septic shock (HCC)   AKI (acute kidney injury) ([place])   Encephalopathy acute   Severe sepsis ([place])  Acute PE in a patient with hypercoagulable state who was noncompliant with his Xarelto for a few days at home. Complicated by PEA arrest.  - He has been treated in ICU with Altepase and anticoagulation, required endotracheal  intubation and mechanical ventilation for several days, remained intubated for close to 10 days and finally extubated on 05/28/2022.  Also developed hospital/ventilator acquired pneumonia for which she has finished all his antibiotics on 05/29/2022. Is currently on oral Eliquis for his DVT PE. Satting well on RA. Hemodynamically stable and improving.   Myxedema with history of hypothyroidism   Toxic and metabolic encephalopathy -Mental status  much improved, no focal deficits, but remains generally weak and deconditioned -Initially requiring IV Synthroid, will transition to oral Synthroid today. -NG tube was discontinued  -As well he had PEA arrest, SLP evaluation for cognitive dysfunction, he scored 17/3, continue with cognition therapy. -Overall patient continues to have significant improvement with deconditioning, and mentation over the last 3 days  Dysphagia -Much improved, advanced to heart/carb   AKI, dehydration and hypernatremia.  Seen by nephrology. Overall improving.  - Intravascularly appears dehydrated, holding lasix. Sodium normalized.  -Started on oral bicarb given low bicarb level -Continue improving  DM type II.   -Continue with Lantus and insulin sliding scale   CBG (last 3)  Recent Labs    06/11/22 0429 06/11/22 0803 06/11/22 1215  GLUCAP 137* 123* 148*      Paroxysmal atrial fibrillation.  Mali vas 2 score of greater than 4.   -Pt was on amiodarone and Eliquis combination but Amiodarone dced 01/19. Will continue Eliquis. Pt in NSR.  He remains on metoprolol.   Hypertension.   Chronic. Normotensive this am. Placed on lopressor 25 mg BID. CTM.     Anemia of acute/critical illness along with some blood loss initially in ICU due to right groin hematoma at the site of arterial stick.  Stable for now. -Continue to monitor on Eliquis.   COPD and OSA.   Chronic. Supportive care nighttime CPAP.   Morbid obesity.  BMI 43.   Chronic. Follow-up with PCP for weight loss.   DVT prophylaxis: Eliquis Code Status: Full Family Communication: parents at bedside Disposition: He will benefit for CIR as he will need more PT/OT and cognitive therapy  Status is: Inpatient    Consultants:  PCCM Renal GI   Subjective:  No significant Events overnight, he denies any complaints today  Objective: Vitals:   06/10/22 2003 06/10/22 2300 06/11/22 0500 06/11/22 0700  BP: 116/72  120/73 110/70  Pulse: 81    79  Resp: 19     Temp: 98 F (36.7 C) 97.7 F (36.5 C)  98.4 F (36.9 C)  TempSrc: Oral Oral  Oral  SpO2: 100%   100%  Weight:   (!) 150.6 kg   Height:        Intake/Output Summary (Last 24 hours) at 06/11/2022 1354 Last data filed at 06/11/2022 1044 Gross per 24 hour  Intake 320 ml  Output 4600 ml  Net -4280 ml   Filed Weights   06/09/22 0500 06/10/22 0500 06/11/22 0500  Weight: (!) 152.5 kg (!) 155.6 kg (!) 150.6 kg    Examination:  Awake Alert, in no apparent distress Symmetrical Chest wall movement, Good air movement bilaterally, CTAB RRR,No Gallops,Rubs or new Murmurs, No Parasternal Heave +ve B.Sounds, Abd Soft, No tenderness, No rebound - guarding or rigidity. No Cyanosis, Clubbing or edema, No new Rash or bruise        Data Reviewed: I have personally reviewed following labs and imaging studies  CBC: Recent Labs  Lab 06/10/22 0645 06/11/22 0254  WBC 6.4 6.7  HGB 10.1* 10.2*  HCT 32.8* 31.7*  MCV 100.0 97.2  PLT 245 247    Basic Metabolic Panel: Recent Labs  Lab 06/05/22 0302 06/06/22 0237 06/07/22 0725 06/08/22 0804 06/09/22 0455 06/10/22 0645 06/11/22 0254  NA 148* 145 139 143 140 137 137  K 4.2 3.8 3.8 3.9 3.8 3.7 3.5  CL 121* 118* 113* 115* 113* 111 112*  CO2 18* 18* 18* 19* 20* 18* 19*  GLUCOSE 212* 273* 171* 163* 161* 162* 141*  BUN 60* 55* 53* 48* 39* 35* 31*  CREATININE 2.10* 1.93* 1.90* 2.21* 1.92* 1.95* 1.78*  CALCIUM 8.7* 8.5* 8.2* 8.2* 8.1* 8.3* 8.5*  MG 2.3 2.1 2.1 2.2 2.0  --   --     GFR: Estimated Creatinine Clearance: 67.5 mL/min (A) (by C-G formula based on SCr of 1.78 mg/dL (H)).  Liver Function Tests: Recent Labs  Lab 06/06/22 0237 06/07/22 0725 06/08/22 0804 06/09/22 0455 06/10/22 0645  AST 35 33 36 35 38  ALT 34 32 33 33 36  ALKPHOS 64 68 66 67 68  BILITOT 0.9 1.0 1.2 0.8 0.8  PROT 6.5 6.5 6.7 6.6 6.8  ALBUMIN 2.5* 2.5* 2.6* 2.6* 2.7*    CBG: Recent Labs  Lab 06/10/22 1554 06/10/22 2230  06/11/22 0429 06/11/22 0803 06/11/22 1215  GLUCAP 139* 138* 137* 123* 148*     No results found for this or any previous visit (from the past 240 hour(s)).       Radiology Studies: No results found.      Scheduled Meds:  apixaban  5 mg Oral BID   budesonide (PULMICORT) nebulizer solution  0.5 mg Nebulization BID   Chlorhexidine Gluconate Cloth  6 each Topical Daily   insulin aspart  0-15 Units Subcutaneous TID WC   insulin aspart  0-5 Units Subcutaneous QHS   insulin glargine-yfgn  28 Units Subcutaneous Daily   levothyroxine  250 mcg Oral Q0600   metoCLOPramide  5 mg Oral TID AC & HS   metoprolol tartrate  25 mg Oral BID   multivitamin  1 tablet Oral Daily   mouth rinse  15 mL Mouth Rinse 4 times per day   sodium bicarbonate  650 mg Oral TID   Continuous Infusions:   LOS: 28 days       Huey Bienenstock, MD [facility]   To contact the attending provider between 7A-7P or the covering provider during after hours 7P-7A, please log into the web site [email or web address] and access using universal [place] password for that web site. If you do not have the password, please call the hospital operator.  06/11/2022, 1:54 PM

## 2022-06-12 DIAGNOSIS — I2609 Other pulmonary embolism with acute cor pulmonale: Secondary | ICD-10-CM | POA: Diagnosis not present

## 2022-06-12 LAB — GLUCOSE, CAPILLARY
Glucose-Capillary: 137 mg/dL — ABNORMAL HIGH (ref 70–99)
Glucose-Capillary: 170 mg/dL — ABNORMAL HIGH (ref 70–99)
Glucose-Capillary: 142 mg/dL — ABNORMAL HIGH (ref 70–99)
Glucose-Capillary: 224 mg/dL — ABNORMAL HIGH (ref 70–99)

## 2022-06-12 MED ORDER — ONDANSETRON HCL 4 MG/2ML IJ SOLN
4.0000 mg | Freq: Four times a day (QID) | INTRAMUSCULAR | Status: DC | PRN
  Administered 2022-06-12: 4 mg via INTRAVENOUS
  Filled 2022-06-12: qty 2

## 2022-06-12 NOTE — Plan of Care (Signed)
  Problem: Coping: Goal: Ability to adjust to condition or change in health will improve Outcome: Progressing   Problem: Metabolic: Goal: Ability to maintain appropriate glucose levels will improve Outcome: Progressing   

## 2022-06-12 NOTE — Progress Notes (Signed)
PROGRESS NOTE    Frank Shelton.  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  He had some bleeding from his groin at the site of arterial stick in ICU, he also went into AKI.  He was seen by [facility] and nephrology, he was intubated for several days finally extubated on 05/28/2022, he also developed pneumonia for which she finished on antibiotics on 05/29/2022.  He is currently diffusely weak, has NG tube for feeding. He was transferred to hospitalist service on 05/30/2022, diffusely weak, bedbound with NG tube feed dependence.  with  severe hypernatremia with improving renal function.  Clinically improved, tolerating heart/carb modified diet, renal function much improved, and hypernatremia has resolved.   Significant Hospital Events: 2023/06/02: Cardiac arrest while in BR. CPR  initiated. TNK administered during CPR, fem cvl  placed/ post arrest shock, high dose pressors. Failed attempt on right femoral and right IJ access. Time to ROSC estimated over 30 minutes. Hgb dropped 12 to 8.8  12/31 on 3 pressors and stress dose steroids. In DKA. Insulin gtt started. LMWH placed on hold.  MRSA PCR screen 06/02/2023 > neg  BC x 2 1/1  > one /two pos gpc + staph hominis. Unasyn started, subsequently was switched to meropenem in ICU finished all his antibiotics on 05/29/2022. 1/3 transferred to [facility] catheter removed.  Blood cultures repeated.  Repeat echo: EF 50 to 55% no regional wall abnormality grade 1 diastolic dysfunction RV normal Renal function improving with Lasix.  Weaning pressors.  Adding Seroquel  and clonazepam to assist with weaning 1/7 ETT exchange, bronch for ongoing hemoptysis 1/10 no hemoptysis; resuming heparin today; off levo 1/11 starting to wake up a bit, nephro decr lasix and incr FWF   1/12 more mucus, having incr peaks, changing sedation  1/13 finally awake following commands off vasopressors passed SBT.  Trial of extubation. 1/15 transferred to [facility]. 1/16 TSH elevated at 55, started on IV levothyroxine.  1/19 Pro cal at 0.14, CRP down to 1.4, BNP normal. TSH still elevated at 57, FT4 normal 1/23 NG tube clamped. D1D started. 1/25 he was advanced to heart/carb modified diet, NG tube is discontinued.  Has been compliant and improving with physical therapy    Assessment & Plan:  Principal Problem:   Pulmonary embolism (HCC) Active Problems:   GOUT   OBSTRUCTIVE SLEEP APNEA   Essential hypertension   Other specified chronic obstructive pulmonary disease   Hypercoagulopathy (HCC)   DM type 2 causing vascular disease (HCC)   Upper GI bleed   Coffee ground emesis   Acute respiratory failure with hypoxia and hypercapnia (HCC)   Shock ([place])   Acute respiratory failure ([place])   Enterobacter cloacae pneumonia   Septic shock (HCC)   AKI (acute kidney injury) ([place])   Encephalopathy acute   Severe sepsis ([place])  Acute PE in a patient with hypercoagulable state who was noncompliant with his Xarelto for a few days at home. Complicated by PEA arrest.  - He has been treated in ICU with Altepase and anticoagulation, required endotracheal  intubation and mechanical ventilation for several days, remained intubated for close to 10 days and finally extubated on 05/28/2022.  Also developed hospital/ventilator acquired pneumonia for which she has finished all his antibiotics on 05/29/2022. Is currently on oral Eliquis for his DVT PE. Satting well on RA. Hemodynamically stable and improving.   Myxedema with history of hypothyroidism   Toxic and metabolic encephalopathy -Mental status  much improved, no focal deficits, but remains generally weak and deconditioned -Initially requiring IV Synthroid, will transition to oral Synthroid today. -NG tube was discontinued  -As well he had PEA arrest, SLP evaluation for cognitive dysfunction, he scored 17/3, continue with cognition therapy. -Overall patient continues to have significant improvement with deconditioning, and mentation over the last few days  Dysphagia -Much improved, advanced to heart/carb   AKI, dehydration and hypernatremia.  Seen by nephrology. Overall improving.  - Intravascularly appears dehydrated, holding lasix. Sodium normalized.  -Started on oral bicarb given low bicarb level -Continue improving  DM type II.   -Continue with Lantus and insulin sliding scale   CBG (last 3)  Recent Labs    06/11/22 1631 06/11/22 2025 06/12/22 0836  GLUCAP 158* 135* 137*      Paroxysmal atrial fibrillation.  Mali vas 2 score of greater than 4.   -Pt was on amiodarone and Eliquis combination but Amiodarone dced 01/19. Will continue Eliquis. Pt in NSR.  He remains on metoprolol.   Hypertension.   Chronic. Normotensive this am. Placed on lopressor 25 mg BID. CTM.     Anemia of acute/critical illness along with some blood loss initially in ICU due to right groin hematoma at the site of arterial stick.  Stable for now. -Continue to monitor on Eliquis.   COPD and OSA.   Chronic. Supportive care nighttime CPAP.   Morbid obesity.  BMI 43.   Chronic. Follow-up with PCP for weight loss.   DVT prophylaxis: Eliquis Code Status: Full Family Communication: parents at bedside Disposition: He will benefit for CIR as he will need more PT/OT and cognitive therapy, he appears to be motivated, asking to work with physical therapy/Occupational Therapy, and to get out of bed to chair with staff assistance.  Status is: Inpatient    Consultants:  PCCM Renal GI   Subjective:  No significant Events overnight, he denies  any complaints today  Objective: Vitals:   06/11/22 2202 06/12/22 0423 06/12/22 0427 06/12/22 0835  BP: 114/70  125/79 129/74  Pulse: 82  81 79  Resp: (!) 22   20  Temp:   (!) 97.3 F (36.3 C) 97.6 F (36.4 C)  TempSrc:   Oral Oral  SpO2: 95%   90%  Weight:  (!) 150 kg    Height:        Intake/Output Summary (Last 24 hours) at 06/12/2022 1302 Last data filed at 06/12/2022 0600 Gross per 24 hour  Intake --  Output 2400 ml  Net -2400 ml   Filed Weights   06/10/22 0500 06/11/22 0500 06/12/22 0423  Weight: (!) 155.6 kg (!) 150.6 kg (!) 150 kg    Examination:  Awake Alert, Oriented X 3, in no apparent distress Symmetrical Chest wall movement, Good air movement bilaterally, CTAB RRR,No Gallops,Rubs or new Murmurs, No Parasternal Heave +ve B.Sounds, Abd Soft, No tenderness, No rebound - guarding or rigidity. No Cyanosis, Clubbing or edema, No new Rash or bruise         Data Reviewed: I have personally reviewed following labs and imaging studies  CBC:  Recent Labs  Lab 06/10/22 0645 06/11/22 0254  WBC 6.4 6.7  HGB 10.1* 10.2*  HCT 32.8* 31.7*  MCV 100.0 97.2  PLT 245 247    Basic Metabolic Panel: Recent Labs  Lab 06/06/22 0237 06/07/22 0725 06/08/22 0804 06/09/22 0455 06/10/22 0645 06/11/22 0254  NA 145 139 143 140 137 137  K 3.8 3.8 3.9 3.8 3.7 3.5  CL 118* 113* 115* 113* 111 112*  CO2 18* 18* 19* 20* 18* 19*  GLUCOSE 273* 171* 163* 161* 162* 141*  BUN 55* 53* 48* 39* 35* 31*  CREATININE 1.93* 1.90* 2.21* 1.92* 1.95* 1.78*  CALCIUM 8.5* 8.2* 8.2* 8.1* 8.3* 8.5*  MG 2.1 2.1 2.2 2.0  --   --     GFR: Estimated Creatinine Clearance: 67.4 mL/min (A) (by C-G formula based on SCr of 1.78 mg/dL (H)).  Liver Function Tests: Recent Labs  Lab 06/06/22 0237 06/07/22 0725 06/08/22 0804 06/09/22 0455 06/10/22 0645  AST 35 33 36 35 38  ALT 34 32 33 33 36  ALKPHOS 64 68 66 67 68  BILITOT 0.9 1.0 1.2 0.8 0.8  PROT 6.5 6.5 6.7 6.6 6.8  ALBUMIN 2.5*  2.5* 2.6* 2.6* 2.7*    CBG: Recent Labs  Lab 06/11/22 0803 06/11/22 1215 06/11/22 1631 06/11/22 2025 06/12/22 0836  GLUCAP 123* 148* 158* 135* 137*     No results found for this or any previous visit (from the past 240 hour(s)).       Radiology Studies: No results found.      Scheduled Meds:  apixaban  5 mg Oral BID   budesonide (PULMICORT) nebulizer solution  0.5 mg Nebulization BID   Chlorhexidine Gluconate Cloth  6 each Topical Daily   insulin aspart  0-15 Units Subcutaneous TID WC   insulin aspart  0-5 Units Subcutaneous QHS   insulin glargine-yfgn  28 Units Subcutaneous Daily   levothyroxine  250 mcg Oral Q0600   metoCLOPramide  5 mg Oral TID AC & HS   metoprolol tartrate  25 mg Oral BID   multivitamin  1 tablet Oral Daily   mouth rinse  15 mL Mouth Rinse 4 times per day   sodium bicarbonate  650 mg Oral TID   Continuous Infusions:   LOS: 29 days       Huey Bienenstock, MD [facility]   To contact the attending provider between 7A-7P or the covering provider during after hours 7P-7A, please log into the web site [email or web address] and access using universal [place] password for that web site. If you do not have the password, please call the hospital operator.  06/12/2022, 1:02 PM

## 2022-06-12 NOTE — Progress Notes (Signed)
Pt refused CPAP for nighttime use. RN made aware. 

## 2022-06-13 LAB — GLUCOSE, CAPILLARY
Glucose-Capillary: 130 mg/dL — ABNORMAL HIGH (ref 70–99)
Glucose-Capillary: 186 mg/dL — ABNORMAL HIGH (ref 70–99)
Glucose-Capillary: 145 mg/dL — ABNORMAL HIGH (ref 70–99)
Glucose-Capillary: 157 mg/dL — ABNORMAL HIGH (ref 70–99)
Glucose-Capillary: 135 mg/dL — ABNORMAL HIGH (ref 70–99)

## 2022-06-13 MED ORDER — DOCUSATE SODIUM 100 MG PO CAPS
100.0000 mg | ORAL_CAPSULE | Freq: Two times a day (BID) | ORAL | Status: DC
  Administered 2022-06-14 (×2): 100 mg via ORAL
  Filled 2022-06-13 (×2): qty 1

## 2022-06-13 NOTE — Progress Notes (Signed)
Pt refused CPAP. RT will cont to monitor as needed.  

## 2022-06-13 NOTE — Progress Notes (Signed)
Inpatient Rehab Admissions Coordinator:    I continue to await insurance auth for CIR. I will continue to follow and pursue admit pending insurance auth.   Clemens Catholic, [place], [place] Admissions Coordinator  [phone] ([place]) [phone] (office)

## 2022-06-13 NOTE — Progress Notes (Cosign Needed Addendum)
Durable Medical Equipment  (From admission, onward)           Start     Ordered   06/13/22 1509  For home use only DME Hospital bed  Once       Comments: BARIATRIC  330# 6'4"  Question Answer Comment  Length of Need 6 Months   The above medical condition requires: Patient requires the ability to reposition frequently   Head must be elevated greater than: 30 degrees   Bed type Semi-electric   Hoyer Lift Yes      06/13/22 1509   06/13/22 1505  For home use only DME Bedside commode  Once       Comments: BARIATRIC  330# 6'4"  Question:  Patient needs a bedside commode to treat with the following condition  Answer:  Weakness   06/13/22 1504   06/13/22 1505  For home use only DME standard manual wheelchair with seat cushion  Once       Comments: BARIATRIC  330# 6'4"    Patient suffers from weakness which impairs their ability to perform daily activities like  in the home.  A walker will not resolve issue with performing activities of daily living. A wheelchair will allow patient to safely perform daily activities. Patient can safely propel the wheelchair in the home or has a caregiver who can provide assistance. Length of need 6 months . Accessories: elevating leg rests (ELRs), wheel locks, extensions and anti-tippers.   06/13/22 1509

## 2022-06-13 NOTE — Progress Notes (Signed)
Physical Therapy Treatment Patient Details Name: Frank Shelton. 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  Pt was intubated from 12/30-1/13.  CXR reported small right pleural effusion with probable adjacent right basilar Atelectasis. Significant PMH: PE/DVT, DM, HTN, gout, COPD, OSA.    PT Comments    Pt required mod assist supine to sit, +2 mod assist sit to stand in bari stedy, and dependent bari stedy transfer bed to recliner. Planned for progression to RW standing trials from recliner, but pt with c/o nausea. Additional standing trials, therefore, deferred. Pt positioned with feet elevated in recliner. LE exercises in elevated position. Pt remained in recliner at end of session with plan to sit up through lunch.    Recommendations for follow up therapy are one component of a multi-disciplinary discharge planning process, led by the attending physician.  Recommendations may be updated based on patient status, additional functional criteria and insurance authorization.  Follow Up Recommendations  Acute inpatient rehab (3hours/day)     Assistance Recommended at Discharge Frequent or constant Supervision/Assistance  Patient can return home with the following Two people to help with walking and/or transfers;Two people to help with bathing/dressing/bathroom;Assistance with cooking/housework;Direct supervision/assist for medications management;Direct supervision/assist for financial management;Assist for transportation;Help with stairs or ramp for entrance   Equipment Recommendations  Hospital bed;Wheelchair (measurements PT);Wheelchair cushion (measurements PT)    Recommendations for Other Services        Precautions / Restrictions Precautions Precautions: Fall     Mobility  Bed Mobility Overal bed mobility: Needs Assistance Bed Mobility: Supine to Sit     Supine to sit: Mod assist, HOB elevated     General bed mobility comments: assist with BLE and trunk    Transfers Overall transfer level: Needs assistance   Transfers: Sit to/from Stand Sit to Stand: +2 physical assistance, Mod assist, From elevated surface           General transfer comment: +2 mod assist sit to stand in bari stedy Transfer via Lift Equipment: Stedy  Ambulation/Gait                   Stairs             Wheelchair Mobility    Modified Rankin (Stroke Patients Only)       Balance Overall balance assessment: Needs assistance Sitting-balance support: Feet supported, No upper extremity supported Sitting balance-Leahy Scale: Good     Standing balance support: Bilateral upper extremity supported, During functional activity, Reliant on assistive device for balance Standing balance-Leahy Scale: Zero                              Cognition Arousal/Alertness: Awake/alert Behavior During Therapy: WFL for tasks assessed/performed Overall Cognitive Status: Impaired/Different from baseline Area of Impairment: Awareness, Safety/judgement, Problem solving, Memory                     Memory: Decreased short-term memory   Safety/Judgement: Decreased awareness of deficits Awareness: Emergent Problem Solving: Slow processing, Difficulty sequencing          Exercises General Exercises - Lower Extremity  Ankle Circles/Pumps: AROM, Both, 10 reps Hip ABduction/ADduction: AROM, Both, 5 reps    General Comments General comments (skin integrity, edema, etc.): VSS on RA      Pertinent Vitals/Pain Pain Assessment Pain Assessment: No/denies pain    Home Living                          Prior Function            PT Goals (current goals can now be  found in the care plan section) Acute Rehab PT Goals Patient Stated Goal: home PT Goal Formulation: With patient Time For Goal Achievement: 06/27/22 Potential to Achieve Goals: Fair Progress towards PT goals: Progressing toward goals    Frequency    Min 3X/week      PT Plan Current plan remains appropriate    Co-evaluation              AM-PAC PT "6 Clicks" Mobility   Outcome Measure  Help needed turning from your back to your side while in a flat bed without using bedrails?: A Lot Help needed moving from lying on your back to sitting on the side of a flat bed without using bedrails?: A Lot Help needed moving to and from a bed to a chair (including a wheelchair)?: Total Help needed standing up from a chair using your arms (e.g., wheelchair or bedside chair)?: A Lot Help needed to walk in hospital room?: Total Help needed climbing 3-5 steps with a railing? : Total 6 Click Score: 9    End of Session Equipment Utilized During Treatment: Gait belt Activity Tolerance: Patient tolerated treatment well Patient left: in chair;with call bell/phone within reach;with chair alarm set Nurse Communication: Mobility status;Need for lift equipment PT Visit Diagnosis: Other abnormalities of gait and mobility (R26.89);Muscle weakness (generalized) (M62.81)     Time: 0973-5329 PT Time Calculation (min) (ACUTE ONLY): 27 min  Charges:  $Therapeutic Activity: 23-37 mins                     Lorrin Goodell, PT  Office # [phone] Pager [phone]    Lorriane Shire 06/13/2022, 11:23 AM

## 2022-06-13 NOTE — TOC Progression Note (Signed)
Transition of Care (TOC) - Progression Note    Patient Details  Name: Frank Shelton. MRN: [id number] Date of Birth: [birth year]  Transition of Care [facility]) CM/SW [place], LCSW Phone Number: 06/13/2022, 2:16 PM  Clinical Narrative:    CSW met with patient and his parents at bedside (sister Lisabeth Pick on speaker phone). CSW presented SNF bed offer of [facility]. Patient asked what SNF entailed. CSW explained and patient adamantly stated that he would not go to SNF as his brother in law passed away at [facility] due to lack of care. Patient's mother asked CSW if they could just take him home instead with outpatient therapy. CSW explained the differences between outpatient therapy and home health services. They elected outpatient therapy with transport through his [place]. Then later in the conversation patient stated he did not want to go out in public with the covid risk. So home health has been decided on with no preference of agency. Parents asked CSW about a sitter for patient during the night at home. CSW explained that a sitter would be an out of pocket cost as patient does not have Medicaid. Mother reported understanding and stated she will contact DSS to inquire about Medicaid application. Parents confirmed that patient's sister and BIL live next door and his other sister lives down the road as well as his son and multiple nieces and nephews to help out. Their neighbor Levada Dy is an Marine scientist. CSW discussed DME needs and patient has a rolling walker with seat, shower stool, and bedside commode. Patient is asking for a bari wheelchair. RNCM assisting with DME and HH.   CSW confirmed patient's PCP. They are requesting [facility] for transport as patient's parent's home has a few steps. Address patient will be going to: [address] [place] [address].    Parents will be home to accept patient tomorrow. RN to call them to let them know when Corey Harold has been called [phone]).     Expected Discharge Plan: [place] Barriers to Discharge: Continued Medical Work up  Expected Discharge Plan and Services In-house Referral: Clinical Social Work Discharge Planning Services: CM Consult Post Acute Care Choice: IP Rehab Living arrangements for the past 2 months: Single Family Home                                       Social Determinants of Health (SDOH) Interventions [place]: No Food Insecurity (05/12/2022)  Housing: Low Risk  (05/12/2022)  Transportation Needs: No Transportation Needs (05/12/2022)  Utilities: Not At Risk (05/12/2022)  Tobacco Use: Medium Risk (05/12/2022)    Readmission Risk Interventions     No data to display

## 2022-06-13 NOTE — Progress Notes (Signed)
PROGRESS NOTE    Frank Shelton.  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  He had some bleeding from his groin at the site of arterial stick in ICU, he also went into AKI.  He was seen by [facility] and nephrology, he was intubated for several days finally extubated on 05/28/2022, he also developed pneumonia for which she finished on antibiotics on 05/29/2022.  He is currently diffusely weak, has NG tube for feeding. He was transferred to hospitalist service on 05/30/2022, diffusely weak, bedbound with NG tube feed dependence.  with  severe hypernatremia with improving renal function.  Clinically improved, tolerating heart/carb modified diet, renal function much improved, and hypernatremia has resolved.   Significant Hospital Events: 05/23/23: Cardiac arrest while in BR. CPR  initiated. TNK administered during CPR, fem cvl  placed/ post arrest shock, high dose pressors. Failed attempt on right femoral and right IJ access. Time to ROSC estimated over 30 minutes. Hgb dropped 12 to 8.8  12/31 on 3 pressors and stress dose steroids. In DKA. Insulin gtt started. LMWH placed on hold.  MRSA PCR screen May 23, 2023 > neg  BC x 2 1/1  > one /two pos gpc + staph hominis. Unasyn started, subsequently was switched to meropenem in ICU finished all his antibiotics on 05/29/2022. 1/3 transferred to [facility] central venous catheter removed.  Blood cultures repeated.  Repeat echo: EF 50 to 55% no regional wall abnormality grade 1 diastolic dysfunction RV normal Renal function improving with Lasix.  Weaning pressors.  Adding Seroquel  and clonazepam to assist with weaning 1/7 ETT exchange, bronch for ongoing hemoptysis 1/10 no hemoptysis; resuming heparin today; off levo 1/11 starting to wake up a bit, nephro decr lasix and incr FWF   1/12 more mucus, having incr peaks, changing sedation  1/13 finally awake following commands off vasopressors passed SBT.  Trial of extubation. 1/15 transferred to [facility]. 1/16 TSH elevated at 55, started on IV levothyroxine.  1/19 Pro cal at 0.14, CRP down to 1.4, BNP normal. TSH still elevated at 57, FT4 normal 1/23 NG tube clamped. D1D started. 1/25 he was advanced to heart/carb modified diet, NG tube is discontinued.  Has been compliant and improving with physical therapy    Assessment & Plan:  Principal Problem:   Pulmonary embolism (HCC) Active Problems:   GOUT   OBSTRUCTIVE SLEEP APNEA   Essential hypertension   Other specified chronic obstructive pulmonary disease   Hypercoagulopathy (HCC)   DM type 2 causing vascular disease (HCC)   Upper GI bleed   Coffee ground emesis   Acute respiratory failure with hypoxia and hypercapnia (HCC)   Shock ([place])   Acute respiratory failure ([place])   Enterobacter cloacae pneumonia   Septic shock (HCC)   AKI (acute kidney injury) ([place])   Encephalopathy acute   Severe sepsis ([place])  Acute PE in a patient with hypercoagulable state who was noncompliant with his Xarelto for a few days at home. Complicated by PEA arrest.  - He has been treated in ICU with Altepase and anticoagulation, required endotracheal  intubation and mechanical ventilation for several days, remained intubated for close to 10 days and finally extubated on 05/28/2022.  Also developed hospital/ventilator acquired pneumonia for which she has finished all his antibiotics on 05/29/2022. Is currently on oral Eliquis for his DVT PE. Satting well on RA. Hemodynamically stable and improving.   Myxedema with history of hypothyroidism   Toxic and metabolic encephalopathy -Mental status  much improved, no focal deficits, but remains generally weak and deconditioned. -Initially requiring IV Synthroid, transitioned to po Synthroid. -As well he had PEA arrest, SLP evaluation for cognitive dysfunction, he scored 17/3, continue with cognition therapy. -Overall patient continues to have significant improvement with deconditioning, and mentation over the last few days  Dysphagia -Much improved, advanced to heart/carb   AKI, dehydration and hypernatremia.  Seen by nephrology. Overall improving.  - Intravascularly appears dehydrated, holding lasix. Sodium normalized.  -Started on oral bicarb given low bicarb level -Continue improving  DM type II.   -Continue with Lantus and insulin sliding scale   CBG (last 3)  Recent Labs    06/12/22 1639 06/12/22 2146 06/13/22 0736  GLUCAP 142* 224* 130*      Paroxysmal atrial fibrillation.  Mali vas 2 score of greater than 4.   -Pt was on amiodarone and Eliquis combination but Amiodarone dced 01/19. Will continue Eliquis. Pt in NSR.  He remains on metoprolol.   Hypertension.   Chronic. Normotensive this am. Placed on lopressor 25 mg BID. CTM.     Anemia of acute/critical illness along with some blood loss initially in ICU due to right groin hematoma at the site of arterial stick.  Stable for now. -Continue to monitor on Eliquis.   COPD and OSA.   Chronic. Supportive care nighttime CPAP.   Morbid obesity.  BMI 43.   Chronic. Follow-up with PCP for weight loss.   DVT prophylaxis: Eliquis Code Status: Full Family Communication: parents at bedside Disposition: Insurance denied his CIR, will look for subacute rehab  Status is: Inpatient    Consultants:  PCCM Renal GI   Subjective:  No significant Events overnight, he denies any complaints today  Objective: Vitals:   06/13/22 0110 06/13/22 0400 06/13/22 0520 06/13/22 0759  BP:  128/79  128/81  Pulse:  77  77  Resp:  20  18  Temp: 97.6 F (36.4 C)  (!) 97.5 F  (36.4 C)   TempSrc: Oral  Oral   SpO2:  92%  92%  Weight:      Height:        Intake/Output Summary (Last 24 hours) at 06/13/2022 1049 Last data filed at 06/13/2022 1014 Gross per 24 hour  Intake --  Output 3100 ml  Net -3100 ml   Filed Weights   06/10/22 0500 06/11/22 0500 06/12/22 0423  Weight: (!) 155.6 kg (!) 150.6 kg (!) 150 kg    Examination:  Awake Alert, Oriented X 3, in no apparent distress Symmetrical Chest wall movement, Good air movement bilaterally, CTAB RRR,No Gallops,Rubs or new Murmurs, No Parasternal Heave +ve B.Sounds, Abd Soft, No tenderness, No rebound - guarding or rigidity. No Cyanosis, Clubbing or edema, No new Rash or bruise          Data Reviewed: I have personally reviewed following labs and imaging studies  CBC: Recent Labs  Lab 06/10/22 0645 06/11/22 0254  WBC 6.4 6.7  HGB 10.1* 10.2*  HCT 32.8* 31.7*  MCV 100.0 97.2  PLT 245 381    Basic Metabolic Panel: Recent Labs  Lab 06/07/22 0725 06/08/22 0804 06/09/22 0455 06/10/22 0645 06/11/22 0254  NA 139 143 140 137 137  K 3.8 3.9 3.8 3.7 3.5  CL 113* 115* 113* 111 112*  CO2 18* 19* 20* 18* 19*  GLUCOSE 171* 163* 161* 162* 141*  BUN 53* 48* 39* 35* 31*  CREATININE 1.90* 2.21* 1.92* 1.95* 1.78*  CALCIUM 8.2* 8.2* 8.1* 8.3* 8.5*  MG 2.1 2.2 2.0  --   --     GFR: Estimated Creatinine Clearance: 67.4 mL/min (A) (by C-G formula based on SCr of 1.78 mg/dL (H)).  Liver Function Tests: Recent Labs  Lab 06/07/22 0725 06/08/22 0804 06/09/22 0455 06/10/22 0645  AST 33 36 35 38  ALT 32 33 33 36  ALKPHOS 68 66 67 68  BILITOT 1.0 1.2 0.8 0.8  PROT 6.5 6.7 6.6 6.8  ALBUMIN 2.5* 2.6* 2.6* 2.7*    CBG: Recent Labs  Lab 06/12/22 0836 06/12/22 1348 06/12/22 1639 06/12/22 2146 06/13/22 0736  GLUCAP 137* 170* 142* 224* 130*     No results found for this or any previous visit (from the past 240 hour(s)).       Radiology Studies: No results  found.      Scheduled Meds:  apixaban  5 mg Oral BID   budesonide (PULMICORT) nebulizer solution  0.5 mg Nebulization BID   Chlorhexidine Gluconate Cloth  6 each Topical Daily   insulin aspart  0-15 Units Subcutaneous TID WC   insulin aspart  0-5 Units Subcutaneous QHS   insulin glargine-yfgn  28 Units Subcutaneous Daily   levothyroxine  250 mcg Oral Q0600   metoCLOPramide  5 mg Oral TID AC & HS   metoprolol tartrate  25 mg Oral BID   multivitamin  1 tablet Oral Daily   mouth rinse  15 mL Mouth Rinse 4 times per day   sodium bicarbonate  650 mg Oral TID   Continuous Infusions:   LOS: 30 days       Phillips Climes, MD [facility]   To contact the attending provider between 7A-7P or the covering provider during after hours 7P-7A, please log into the web site [email or web address] and access using universal [place] password for that web site. If you do not have the password, please call the hospital operator.  06/13/2022, 10:49 AM

## 2022-06-13 NOTE — Progress Notes (Signed)
Speech Language Pathology Treatment: Cognitive-Linquistic  Patient Details Name: Frank Shelton. MRN: [id number] DOB: 20-Apr-1961 Today's Date: 06/13/2022 Time: 4854-6270 SLP Time Calculation (min) (ACUTE ONLY): 18 min  Assessment / Plan / Recommendation Clinical Impression  Mr. Krueger continues to make excellent gains. Today he demonstrated improved episodic memory - he was able to recall date of admission and details of his hospitalization, naming 5/5 facts. He recalled items from meal trays and times he was served.  Demonstrated improved ability to problem-solve while using his phone and identifying numbers with cues fewer than 25% of time. Excellent candidate for AIR.  SLP will follow while admitted to acute care.   HPI HPI: Pt is a 62 y.o male admitted 12/28 with recurrent PE in setting of  DVT >L> R. Suffered cardiac arrest with CPR required 2/30.  Pt was intubated from 12/30-1/13.  PMH: PE/DVT, DM, HTN, gout, COPD, OSA.      SLP Plan  Continue with current plan of care      Recommendations for follow up therapy are one component of a multi-disciplinary discharge planning process, led by the attending physician.  Recommendations may be updated based on patient status, additional functional criteria and insurance authorization.    Recommendations   AIR                Oral Care Recommendations: Oral care BID Follow Up Recommendations: Acute inpatient rehab (3hours/day) Assistance recommended at discharge: Frequent or constant Supervision/Assistance SLP Visit Diagnosis: Cognitive communication deficit (J50.093) Plan: Continue with current plan of care         Tona Qualley L. Tivis Ringer, MA CCC/SLP Clinical Specialist - Acute Care SLP Acute Rehabilitation Services Office number [phone]   Frank Shelton  06/13/2022, 10:13 AM

## 2022-06-13 NOTE — NC FL2 (Signed)
[place] LEVEL OF CARE FORM     IDENTIFICATION  Patient Name: Frank Shelton. Birthdate: 02-14-1961 Sex: male Admission Date (Current Location): 05/12/2022  [facility] and [place] Number:  Herbalist and Address:  [facility], [place] [address] [place], [place] [address]      Provider Number: [id number]  Attending Physician Name and Address:  Elgergawy, Silver Huguenin, MD  Relative Name and Phone Number:       Current Level of Care: Hospital Recommended Level of Care: [place] Prior Approval Number:    Date Approved/Denied:   PASRR Number:    Discharge Plan: SNF    Current Diagnoses: Patient Active Problem List   Diagnosis Date Noted   Severe sepsis ([place]) 05/27/2022   AKI (acute kidney injury) ([place]) 05/26/2022   Encephalopathy acute 05/26/2022   Acute respiratory failure ([place]) 05/25/2022   Enterobacter cloacae pneumonia 05/25/2022   Septic shock ([place]) 05/25/2022   Acute respiratory failure with hypoxia and hypercapnia ([place]) 05/22/2022   Shock ([place]) 05/22/2022   Coffee ground emesis 05/16/2022   Upper GI bleed 05/15/2022   Pulmonary embolism ([place]) 05/12/2022   COVID-19 virus infection 05/17/2020   Hyperglycemia 05/16/2020   Varicose veins of bilateral lower extremities with other complications 62/70/3500   Postsurgical hypothyroidism 05/04/2017   Multiple thyroid nodules 01/26/2017   DM type 2 causing vascular disease ([place]) 01/10/2017   Primary osteoarthritis of both first carpometacarpal joints 02/27/2014   Atypical chest pain 01/09/2014   Hypercoagulopathy ([place]) 01/09/2014   DVT (deep venous thrombosis) ([place]) 01/12/2012   Cellulitis of left lower extremity 01/12/2012   ASTHMA 08/02/2010   NUMBNESS, HAND 08/02/2010   GOUT 07/30/2010   Morbid obesity ([place]) 07/30/2010   OBSTRUCTIVE SLEEP APNEA 07/30/2010   RESTLESS LEG SYNDROME 07/30/2010   Essential hypertension 07/30/2010   ALLERGIC RHINITIS  07/30/2010   Other specified chronic obstructive pulmonary disease 07/30/2010   GERD 07/30/2010   PULMONARY EMBOLISM, HX OF 07/30/2010   GASTROINTESTINAL XRAY, ABNORMAL 09/14/2009   OTHER DYSPHAGIA 08/24/2009    Orientation RESPIRATION BLADDER Height & Weight     Self, Time, Situation, Place  Normal, Other (Comment) (Has home Cpap nightly) Continent, External catheter Weight: (!) 330 lb 11 oz (150 kg) Height:  6\' 4"  (193 cm)  BEHAVIORAL SYMPTOMS/MOOD NEUROLOGICAL BOWEL NUTRITION STATUS      Incontinent Diet (See dc summary)  AMBULATORY STATUS COMMUNICATION OF NEEDS Skin    (+2 moderate assist) Verbally Other (Comment) (Deep tissue injury on vertebral column w/foam dressing)                       Personal Care Assistance Level of Assistance  Bathing, Feeding, Dressing Bathing Assistance: Maximum assistance Feeding assistance: Independent Dressing Assistance: Limited assistance     Functional Limitations Info  Sight Sight Info: Impaired        SPECIAL CARE FACTORS FREQUENCY  PT (By licensed PT), OT (By licensed OT)     PT Frequency: 5x/week OT Frequency: 5x/week            Contractures Contractures Info: Not present    Additional Factors Info  Insulin Sliding Scale, Code Status, Allergies Code Status Info: Full Allergies Info: Nabumetone   Insulin Sliding Scale Info: see dc summary       Current Medications (06/13/2022):  This is the current hospital active medication list Current Facility-Administered Medications  Medication Dose Route Frequency Provider Last Rate Last Admin  acetaminophen (TYLENOL) tablet 650 mg  650 mg Oral Q6H PRN Elgergawy, Silver Huguenin, MD       Or   acetaminophen (TYLENOL) suppository 650 mg  650 mg Rectal Q6H PRN Elgergawy, Silver Huguenin, MD       albuterol (PROVENTIL) (2.5 MG/3ML) 0.083% nebulizer solution 2.5 mg  2.5 mg Nebulization Q4H PRN Barton Dubois, MD   2.5 mg at 05/25/22 0528   apixaban (ELIQUIS) tablet 5 mg  5 mg Oral BID  Elgergawy, Silver Huguenin, MD   5 mg at 06/13/22 1009   budesonide (PULMICORT) nebulizer solution 0.5 mg  0.5 mg Nebulization BID Barton Dubois, MD   0.5 mg at 06/13/22 0347   Chlorhexidine Gluconate Cloth 2 % PADS 6 each  6 each Topical Daily Barton Dubois, MD   6 each at 06/12/22 0902   guaiFENesin (ROBITUSSIN) 100 MG/5ML liquid 5 mL  5 mL Oral Q4H PRN Thurnell Lose, MD   5 mL at 06/11/22 1521   insulin aspart (novoLOG) injection 0-15 Units  0-15 Units Subcutaneous TID WC Idamae Schuller, MD   2 Units at 06/13/22 1010   insulin aspart (novoLOG) injection 0-5 Units  0-5 Units Subcutaneous QHS Howerter, Justin B, DO   2 Units at 06/12/22 2155   insulin glargine-yfgn (SEMGLEE) injection 28 Units  28 Units Subcutaneous Daily Idamae Schuller, MD   28 Units at 06/13/22 1010   levothyroxine (SYNTHROID) tablet 250 mcg  250 mcg Oral Q0600 Elgergawy, Silver Huguenin, MD   250 mcg at 06/13/22 0622   metoCLOPramide (REGLAN) tablet 5 mg  5 mg Oral TID AC & HS Elgergawy, Silver Huguenin, MD   5 mg at 06/13/22 1009   metoprolol tartrate (LOPRESSOR) tablet 25 mg  25 mg Oral BID Elgergawy, Silver Huguenin, MD   25 mg at 06/13/22 1009   multivitamin (RENA-VIT) tablet 1 tablet  1 tablet Oral Daily Elgergawy, Silver Huguenin, MD   1 tablet at 06/13/22 1009   ondansetron (ZOFRAN) injection 4 mg  4 mg Intravenous Q6H PRN Elgergawy, Silver Huguenin, MD   4 mg at 06/12/22 1242   Oral care mouth rinse  15 mL Mouth Rinse 4 times per day Kipp Brood, MD   15 mL at 06/13/22 0800   Oral care mouth rinse  15 mL Mouth Rinse PRN Agarwala, Einar Grad, MD       sennosides (SENOKOT) 8.8 MG/5ML syrup 15 mL  15 mL Oral QHS PRN Elgergawy, Silver Huguenin, MD       sodium bicarbonate tablet 650 mg  650 mg Oral TID Elgergawy, Silver Huguenin, MD   650 mg at 06/13/22 1009     Discharge Medications: Please see discharge summary for a list of discharge medications.  Relevant Imaging Results:  Relevant Lab Results:   Additional Information SSN: [id number]. Weighs 330lbs. Has home cpap  machine.  Benard Halsted, LCSW

## 2022-06-13 NOTE — TOC Progression Note (Signed)
Transition of Care (TOC) - Progression Note    Patient Details  Name: Frank Shelton. MRN: [id number] Date of Birth: 14-May-1961  Transition of Care [facility]) CM/SW [place], LCSW Phone Number: 06/13/2022, 12:18 PM  Clinical Narrative:    Per MD, CIR appeal was denied. CSW sent referral to SNFs in [place] for review.    Expected Discharge Plan: [place] Barriers to Discharge: Ship broker, SNF Pending bed offer  Expected Discharge Plan and Services In-house Referral: Clinical Social Work Discharge Planning Services: CM Consult Post Acute Care Choice: IP Rehab Living arrangements for the past 2 months: Single Family Home                                       Social Determinants of Health (SDOH) Interventions [place]: No Food Insecurity (05/12/2022)  Housing: Low Risk  (05/12/2022)  Transportation Needs: No Transportation Needs (05/12/2022)  Utilities: Not At Risk (05/12/2022)  Tobacco Use: Medium Risk (05/12/2022)    Readmission Risk Interventions     No data to display

## 2022-06-13 NOTE — Progress Notes (Signed)
TRH night cross cover note:   I was notified by RN of the patient's request for medication for constipation.  I subsequently placed order for scheduled twice daily Colace.    Babs Bertin, DO Hospitalist

## 2022-06-13 NOTE — TOC Progression Note (Signed)
Transition of Care (TOC) - Progression Note    Patient Details  Name: Frank Shelton. MRN: [id number] Date of Birth: [birth year]  Transition of Care [facility]) CM/SW Contact  Levonne Lapping, RN Phone Number: 06/13/2022, 3:31 PM  Clinical Narrative:    Patient plan is to DC to Home tomorrow 2/30 now.  Refusing SNF.  Patient and Family state that Patient will DC to Parents house where they will assist in care taking along with Patients Sisters, a Brother in [place] and an Adult Son. Patient will DC to [address] [place] [address]; Mothers contact number is [phone]. DME has been ordered thru [employer] and is to be delivered to Parents home tomorrow before patient can be transported via [place].  Bariatric Hospital Bed with [facility] lift/Bariatric wheelchair with cushion and  Bariatric bedside commode.  Home Health has been arranged through [facility] and will be for PT/OT.   TOC will continue to follow patient for any additional discharge needs        Expected Discharge Plan: [place] Barriers to Discharge: Continued Medical Work up  Expected Discharge Plan and Services In-house Referral: Clinical Social Work Discharge Planning Services: CM Consult Post Acute Care Choice: [place] arrangements for the past 2 months: [place]                 DME Arranged: Bedside commode, Hospital bed, Wheelchair manual Optician, dispensing for [place] for wheelchair) DME Agency: [employer] Date DME Agency Contacted: 06/13/22 Time DME Agency Contacted: 0230 Representative spoke with at DME Agency: [place]: RN, PT, OT, Nurse's Aide   Date [place]: 06/13/22 Time [place]: [place] Representative spoke with at [place]: [place] Determinants of Health ([place]) Interventions [place]: No Food Insecurity (05/12/2022)  Housing: Low Risk  (05/12/2022)  Transportation Needs: No Transportation  Needs (05/12/2022)  Utilities: Not At Risk (05/12/2022)  Tobacco Use: Medium Risk (05/12/2022)    Readmission Risk Interventions     No data to display

## 2022-06-14 ENCOUNTER — Other Ambulatory Visit (HOSPITAL_COMMUNITY): Payer: Self-pay

## 2022-06-14 DIAGNOSIS — I2609 Other pulmonary embolism with acute cor pulmonale: Secondary | ICD-10-CM | POA: Diagnosis not present

## 2022-06-14 DIAGNOSIS — N179 Acute kidney failure, unspecified: Secondary | ICD-10-CM | POA: Diagnosis not present

## 2022-06-14 DIAGNOSIS — J9601 Acute respiratory failure with hypoxia: Secondary | ICD-10-CM | POA: Diagnosis not present

## 2022-06-14 LAB — GLUCOSE, CAPILLARY
Glucose-Capillary: 124 mg/dL — ABNORMAL HIGH (ref 70–99)
Glucose-Capillary: 125 mg/dL — ABNORMAL HIGH (ref 70–99)

## 2022-06-14 MED ORDER — INSULIN PEN NEEDLE 32G X 4 MM MISC
0 refills | Status: AC
  Filled 2022-06-14: qty 100, 34d supply, fill #0

## 2022-06-14 MED ORDER — SODIUM BICARBONATE 650 MG PO TABS
650.0000 mg | ORAL_TABLET | Freq: Every day | ORAL | 0 refills | Status: DC
  Filled 2022-06-14: qty 30, 30d supply, fill #0

## 2022-06-14 MED ORDER — APIXABAN 5 MG PO TABS
5.0000 mg | ORAL_TABLET | Freq: Two times a day (BID) | ORAL | 0 refills | Status: AC
  Filled 2022-06-14: qty 60, 30d supply, fill #0

## 2022-06-14 MED ORDER — METOPROLOL TARTRATE 25 MG PO TABS
25.0000 mg | ORAL_TABLET | Freq: Two times a day (BID) | ORAL | 0 refills | Status: AC
  Filled 2022-06-14: qty 60, 30d supply, fill #0

## 2022-06-14 MED ORDER — INSULIN GLARGINE 100 UNIT/ML SOLOSTAR PEN
25.0000 [IU] | PEN_INJECTOR | Freq: Every day | SUBCUTANEOUS | 0 refills | Status: AC
  Filled 2022-06-14: qty 9, 36d supply, fill #0

## 2022-06-14 MED ORDER — LEVOTHYROXINE SODIUM 125 MCG PO TABS
250.0000 ug | ORAL_TABLET | Freq: Every day | ORAL | 0 refills | Status: AC
  Filled 2022-06-14: qty 60, 30d supply, fill #0

## 2022-06-14 MED ORDER — POLYETHYLENE GLYCOL 3350 17 G PO PACK
17.0000 g | PACK | Freq: Two times a day (BID) | ORAL | Status: DC
  Administered 2022-06-14: 17 g via ORAL
  Filled 2022-06-14: qty 1

## 2022-06-14 NOTE — Progress Notes (Addendum)
Discharge paperwork reviewed with client at this time. No further requests have been made at this time. IV has been removed. PTAR to transfer patient. Patients family updated by phone concerning patients arrival and PTAR notification for patients arrival home today. She reports that the assistive devices: Bedside commode, wheelchair and bed have not yet arrived to the patient residence. Social worker notified. RN called parents to let them know Frank Shelton has been called at [phone]). Insulin pen medication administration education provided. Patient teach back method utilized. [place] reports no further question at this time and understands how to properly administer his insulin using the insulin pen method.

## 2022-06-14 NOTE — TOC Transition Note (Signed)
Transition of Care [facility]) - CM/SW Discharge Note   Patient Details  Name: Frank Shelton. MRN: [id number] Date of Birth: 08/26/1960  [facility]) CM/SW Contact:  Levonne Lapping, RN Phone Number: 06/14/2022, 10:24 AM   Clinical Narrative:     Patient to DC to Parents Home today [address] [place] [address];  Corey Harold has been called- DME was being delivered as I was calling PTAR- per [place] has been arranged through [facility] No additional TOC needs       Barriers to Discharge: Continued Medical Work up   Patient Goals and CMS Choice CMS Medicare.gov Compare Post Acute Care list provided to:: Patient Choice offered to / list presented to : Patient  Discharge Placement                         Discharge Plan and Services Additional resources added to the After Visit Summary for   In-house Referral: Clinical Social Work Discharge Planning Services: CM Consult Post Acute Care Choice: Home Health          DME Arranged: Hospital bed, Bedside commode, Wheelchair manual Optician, dispensing ordered with Hospital Bed. Cushion ordered for wheelchair) DME Agency: [employer] Date DME Agency Contacted: 06/13/22 Time DME Agency Contacted: 0230 Representative spoke with at DME Agency: Maudie Mercury HH Arranged: PT, OT [facility] Agency: [place] Date [place]: 06/13/22 Time [place]: [place] Representative spoke with at [place]: [place] Determinants of Health ([place]) Interventions [place]: No Food Insecurity (05/12/2022)  Housing: Low Risk  (05/12/2022)  Transportation Needs: No Transportation Needs (05/12/2022)  Utilities: Not At Risk (05/12/2022)  Tobacco Use: Medium Risk (05/12/2022)     Readmission Risk Interventions     No data to display

## 2022-06-14 NOTE — Discharge Planning (Signed)
Physician Discharge Summary  Frank Shelton. 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  He had some bleeding from his groin at the site of arterial stick in ICU, he also went into AKI.  He was seen by [facility] and nephrology, he was intubated for several days finally extubated on 05/28/2022, he also developed pneumonia for which she finished on antibiotics on 05/29/2022.  He is currently diffusely weak, has NG tube for feeding. He was transferred to hospitalist service on 05/30/2022, diffusely weak, bedbound with NG tube feed dependence.  with  severe hypernatremia with improving renal function.  Clinically improved, tolerating heart/carb modified diet, renal function much improved, and hypernatremia has resolved.   Significant Hospital Events: 2023/05/31: Cardiac arrest while in BR. CPR  initiated. TNK administered during CPR, fem cvl  placed/ post arrest shock, high dose pressors. Failed attempt on right femoral and right IJ access. Time to ROSC estimated over 30 minutes. Hgb dropped 12 to 8.8  12/31 on 3 pressors and stress dose steroids. In DKA. Insulin gtt started. LMWH placed on hold.  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 Unasyn started, subsequently was switched to meropenem in ICU finished all his antibiotics on 05/29/2022. 1/3 transferred to [facility] central venous catheter removed.  Blood cultures repeated.  Repeat echo: EF 50 to 55% no regional wall abnormality grade 1 diastolic dysfunction RV normal Renal function improving with Lasix.  Weaning pressors.  Adding Seroquel and clonazepam to assist with weaning 1/7 ETT exchange, bronch for ongoing hemoptysis 1/10 no hemoptysis; resuming heparin today; off levo 1/11 starting to wake up a bit, nephro decr lasix and incr FWF   1/12 more mucus, having incr peaks, changing sedation  1/13 finally awake following commands off vasopressors passed SBT.  Trial of extubation. 1/15 transferred to [id number]. 1/16 TSH elevated at 55, started on IV levothyroxine.  1/19 Pro cal at 0.14, CRP down to 1.4, BNP normal. TSH still elevated at 57, FT4 normal 1/23 NG tube clamped. D1D started. 1/25 he was advanced to heart/carb modified diet, NG tube is discontinued.  Has been compliant and improving with physical therapy   Acute PE in a patient with hypercoagulable state who was noncompliant with his Xarelto for a few days at home. Complicated by PEA arrest.  Acute respiratory failure Ventilator associated pneumonia - He has been treated in ICU with Altepase and anticoagulation, required endotracheal intubation and mechanical ventilation for several days, remained intubated for close to 10 days and finally extubated on 05/28/2022.  Also developed hospital/ventilator acquired pneumonia for which she has finished all his antibiotics on 05/29/2022. Is currently on oral Eliquis for his DVT PE. Satting well on RA. Hemodynamically stable and  improving.   Myxedema with history of hypothyroidism   Toxic and metabolic encephalopathy -Mental status much improved, no focal deficits, but remains generally weak and deconditioned. -Initially requiring IV Synthroid, transitioned to po  Synthroid. -As well he had PEA arrest, SLP evaluation for cognitive dysfunction, he scored 17/3 -Overall patient continues to have significant improvement with deconditioning, and mentation over the last few days.   Dysphagia -Much improved, advanced to heart/carb    AKI, dehydration and hypernatremia.  Seen by nephrology. Overall improving.  - Intravascularly appears dehydrated, holding lasix. Sodium normalized.  Creatinine peaked at 9.1 -Started on oral bicarb given low bicarb level -Continues to improve most recent creatinine level is 1.7,    DM type II.   -Discharged on Lantus       Paroxysmal atrial fibrillation.  Mali vas 2 score of greater than 4.   -Pt was on amiodarone and Eliquis combination but Amiodarone dced 01/19. Will continue Eliquis. Pt in NSR.  He remains on metoprolol.   Hypertension.   Chronic. Normotensive continue with metoprolol   anemia of acute/critical illness along with some blood loss initially in ICU due to right groin hematoma at the site of arterial stick.  Stable    COPD and OSA.   Chronic. Supportive care nighttime CPAP.   Morbid obesity.  BMI 43.   Chronic. Follow-up with PCP for weight loss.  Discharge Diagnoses:  Principal Problem:   Pulmonary embolism (HCC) Active Problems:   GOUT   OBSTRUCTIVE SLEEP APNEA   Essential hypertension   Other specified chronic obstructive pulmonary disease   Hypercoagulopathy (HCC)   DM type 2 causing vascular disease (HCC)   Upper GI bleed   Coffee ground emesis   Acute respiratory failure with hypoxia and hypercapnia (HCC)   Shock ([place])   Acute respiratory failure ([place])   Enterobacter cloacae pneumonia   Septic shock ([place])   AKI (acute kidney injury) ([place])   Encephalopathy acute   Severe sepsis [facility])    Discharge Instructions  Discharge Instructions     Diet - low sodium heart healthy   Complete by: As directed    Discharge instructions   Complete by: As directed    Follow with Primary  MD [name], [name], FNP in 7 days   Get CBC, CMP, 2 view Chest X ray checked  by Primary MD next visit.    Activity: As tolerated with Full fall precautions use walker/cane & assistance as needed   Disposition Home    Diet: Heart Healthy /carb modified   On your next visit with your primary care physician please Get Medicines reviewed and adjusted.   Please request your Prim.MD to go over all Hospital Tests and Procedure/Radiological results at the follow up, please get all Hospital records sent to your Prim MD by signing hospital release before you go home.   If you experience worsening of your admission symptoms, develop shortness of breath, life threatening emergency, suicidal or homicidal thoughts you must seek medical attention immediately by calling 911 or calling your MD immediately  if symptoms less severe.  You Must read complete instructions/literature along with all the possible adverse reactions/side effects for all the Medicines you take and that have been prescribed to you. Take any new Medicines after you have completely understood and accpet all the possible adverse reactions/side effects.   Do not drive, operating heavy machinery, perform activities at heights, swimming or participation in water activities or provide baby sitting services if your were admitted for  syncope or siezures until you have seen by Primary MD or a Neurologist and advised to do so again.  Do not drive when taking Pain medications.    Do not take more than prescribed Pain, Sleep and Anxiety Medications  Special Instructions: If you have smoked or chewed Tobacco  in the last 2 yrs please stop smoking, stop any regular Alcohol  and or any Recreational drug use.  Wear Seat belts while driving.   Please note  You were cared for by a hospitalist during your hospital stay. If you have any questions about your discharge medications or the care you received while you were in the hospital after  you are discharged, you can call the unit and asked to speak with the hospitalist on call if the hospitalist that took care of you is not available. Once you are discharged, your primary care physician will handle any further medical issues. Please note that NO REFILLS for any discharge medications will be authorized once you are discharged, as it is imperative that you return to your primary care physician (or establish a relationship with a primary care physician if you do not have one) for your aftercare needs so that they can reassess your need for medications and monitor your lab values.   Increase activity slowly   Complete by: As directed    No wound care   Complete by: As directed       Allergies as of 06/14/2022       Reactions   Nabumetone Hives        Medication List     STOP taking these medications    allopurinol 300 MG tablet Commonly known as: ZYLOPRIM   aspirin EC 81 MG tablet   isosorbide mononitrate 30 MG 24 hr tablet Commonly known as: IMDUR   Xarelto 20 MG Tabs tablet Generic drug: rivaroxaban       TAKE these medications    acetaminophen 325 MG tablet Commonly known as: TYLENOL Take 650 mg by mouth every 6 (six) hours as needed for moderate pain or headache.   albuterol 108 (90 Base) MCG/ACT inhaler Commonly known as: VENTOLIN HFA Inhale 2 puffs into the lungs every 4 (four) hours as needed for wheezing or shortness of breath.   apixaban 5 MG Tabs tablet Commonly known as: ELIQUIS Take 1 tablet (5 mg total) by mouth 2 (two) times daily.   ascorbic acid 500 MG tablet Commonly known as: VITAMIN C Take 500 mg by mouth daily.   atorvastatin 40 MG tablet Commonly known as: LIPITOR Take 40 mg by mouth daily.   B-12 5000 MCG Caps Take 5,000 mcg by mouth daily.   budesonide-formoterol 160-4.5 MCG/ACT inhaler Commonly known as: SYMBICORT Inhale 2 puffs into the lungs 2 (two) times daily as needed (for shortness of breath).   calcium carbonate  500 MG chewable tablet Commonly known as: Tums Chew 2 tablets (400 mg of elemental calcium total) by mouth 4 (four) times daily.   calcium-vitamin D 500-200 MG-UNIT tablet Commonly known as: Oscal 500/200 D-3 Take 1 tablet by mouth 2 (two) times daily.   colchicine 0.6 MG tablet Take 0.6 mg by mouth daily as needed (for gout flare/pain).   furosemide 20 MG tablet Commonly known as: LASIX Take 20 mg by mouth daily as needed.   insulin glargine-yfgn 100 UNIT/ML Pen Commonly known as: SEMGLEE Inject 25 Units into the skin daily.   Insulin Pen Needle 32G X 4 MM Misc Please provide 1 month supply  levothyroxine 125 MCG tablet Commonly known as: SYNTHROID Take 2 tablets (250 mcg total) by mouth daily at 6 (six) AM. Start taking on: June 15, 2022 What changed:  medication strength how much to take when to take this   methocarbamol 500 MG tablet Commonly known as: ROBAXIN Take 1 tablet (500 mg total) by mouth 2 (two) times daily as needed for muscle spasms.   metoprolol tartrate 25 MG tablet Commonly known as: LOPRESSOR Take 1 tablet (25 mg total) by mouth 2 (two) times daily.   multivitamin with minerals Tabs tablet Take 1 tablet by mouth daily.   Vitamin D3 125 MCG (5000 UT) Caps Take 5,000 Units by mouth 2 (two) times daily.               Durable Medical Equipment  (From admission, onward)           Start     Ordered   06/13/22 1509  For home use only DME Hospital bed  Once       Comments: BARIATRIC  330# 6'4"  Question Answer Comment  Length of Need 6 Months   The above medical condition requires: Patient requires the ability to reposition frequently   Head must be elevated greater than: 30 degrees   Bed type Semi-electric   Hoyer Lift Yes      06/13/22 1509   06/13/22 1505  For home use only DME Bedside commode  Once       Comments: BARIATRIC  330# 6'4"  Question:  Patient needs a bedside commode to treat with the following condition  Answer:   Weakness   06/13/22 1504   06/13/22 1505  For home use only DME standard manual wheelchair with seat cushion  Once       Comments: BARIATRIC  330# 6'4"    Patient suffers from weakness which impairs their ability to perform daily activities like  in the home.  A walker will not resolve issue with performing activities of daily living. A wheelchair will allow patient to safely perform daily activities. Patient can safely propel the wheelchair in the home or has a caregiver who can provide assistance. Length of need 6 months . Accessories: elevating leg rests (ELRs), wheel locks, extensions and anti-tippers.   06/13/22 1509            Allergies  Allergen Reactions   Nabumetone Hives    Consultations: PCCM renal   Procedures/Studies: DG Chest Port 1 View  Result Date: 06/04/2022 CLINICAL DATA:  Shortness of breath. EXAM: PORTABLE CHEST 1 VIEW COMPARISON:  05/31/2022 FINDINGS: Enteric tube unchanged with tip over the right upper quadrant. Lungs are adequately inflated without focal airspace consolidation or effusion. Subtle hazy prominence of the central pulmonary vessels with interval improvement. Mild stable cardiomegaly. Remainder of the exam is unchanged. IMPRESSION: 1. Mild stable cardiomegaly with suggestion of minimal vascular congestion. 2. Enteric tube unchanged with tip over the right upper quadrant. Electronically Signed   By: Marin Olp M.D.   On: 06/04/2022 07:59   CT HEAD WO CONTRAST (5MM)  Result Date: 05/31/2022 CLINICAL DATA:  Delirium. EXAM: CT HEAD WITHOUT CONTRAST TECHNIQUE: Contiguous axial images were obtained from the base of the skull through the vertex without intravenous contrast. RADIATION DOSE REDUCTION: This exam was performed according to the departmental dose-optimization program which includes automated exposure control, adjustment of the mA and/or kV according to patient size and/or use of iterative reconstruction technique. COMPARISON:  None Available.  FINDINGS: Brain: No midline  shift, ventriculomegaly, mass effect, evidence of mass lesion, intracranial hemorrhage or evidence of cortically based acute infarction. Gray-white matter differentiation is within normal limits throughout the brain. Vascular: No hyperdense vessel. Skull: No suspicious bone lesions.  Skull base is unremarkable. Sinuses/Orbits: Near-complete opacification of the sphenoid sinuses. Partially visualized nasal enteric tube. Trace fluid in the bilateral mastoids. Other: None. IMPRESSION: No acute intracranial abnormality. Electronically Signed   By: Emmit Alexanders M.D   On: 05/31/2022 09:55   DG Abd Portable 1V  Result Date: 05/31/2022 CLINICAL DATA:  Constipation EXAM: PORTABLE ABDOMEN - 1 VIEW COMPARISON:  Plain film of the abdomen dated 05/20/2022. FINDINGS: Enteric tube projects into the proximal duodenum. Visualized portion of the bowel gas pattern is unremarkable no dilated large or small bowel loops appreciated. Portion of the upper abdomen and RIGHT abdomen is excluded. Moderate amount of stool is seen within the visualized portion of the colon. No evidence of free intraperitoneal air is seen. Probable RIGHT renal stone. IVC filter in place. IMPRESSION: 1. Nonobstructive bowel gas pattern, although portions of the bowel are excluded on this exam. Moderate amount of stool is seen within the visualized portion of the colon. 2. Enteric tube projects into the proximal duodenum. 3. Probable RIGHT renal stone. Electronically Signed   By: Franki Cabot M.D.   On: 05/31/2022 09:20   DG Chest Port 1 View  Result Date: 05/31/2022 CLINICAL DATA:  Shortness of breath, constipation. EXAM: PORTABLE CHEST 1 VIEW COMPARISON:  Chest x-rays dated 05/30/2022, 05/27/2022 and 05/14/2022. Chest CT dated 05/12/2022. FINDINGS: Stable cardiomegaly. Central pulmonary vascular congestion and mild diffuse interstitial prominence. Probable mild atelectasis and/or small pleural effusion at the LEFT lung  base. No pneumothorax is seen. Enteric tube passes below the diaphragm. IMPRESSION: 1. Cardiomegaly with central pulmonary vascular congestion and mild diffuse interstitial prominence, compatible with CHF/volume overload. 2. Probable mild atelectasis and/or small pleural effusion at the LEFT lung base. Electronically Signed   By: Franki Cabot M.D.   On: 05/31/2022 09:17   DG Chest Port 1 View  Result Date: 05/30/2022 CLINICAL DATA:  62 year old male with shortness of breath. EXAM: PORTABLE CHEST 1 VIEW COMPARISON:  Portable chest 05/27/2022 and earlier. FINDINGS: Portable AP semi upright views at 0653 hours. Enteric feeding tube in place and courses into the proximal duodenum, tip not included. Mildly lower lung volumes. Mild cardiomegaly. Stable cardiac size and mediastinal contours. Allowing for portable technique the lungs are clear. No pneumothorax or pleural effusion. Surgical clips at thoracic inlet, probably thyroidectomy related. No acute osseous abnormality identified. Paucity of bowel gas in the visible abdomen. IMPRESSION: 1. Lower lung volumes, no acute cardiopulmonary abnormality. 2. Enteric feeding tube courses into the duodenum, tip not included. Electronically Signed   By: Genevie Ann M.D.   On: 05/30/2022 07:10   DG CHEST PORT 1 VIEW  Result Date: 05/27/2022 CLINICAL DATA:  Acute respiratory failure with hypoxia. EXAM: PORTABLE CHEST 1 VIEW COMPARISON:  Chest radiograph 05/22/2022 FINDINGS: The endotracheal tube tip is approximately 3.7 cm from the carina. Two enteric catheres are in place, the tips are not seen. A left subclavian venous catheter is seen. The tip is partially obscured but likely terminates in the upper SVC. The heart is enlarged, unchanged. The upper mediastinal contours are prominent, also unchanged. There is vascular congestion without definite overt pulmonary edema. A small right pleural effusion is again seen with probable right basilar atelectasis. There is no other focal  airspace disease. There is no significant left effusion. There  is no pneumothorax There is no acute osseous abnormality. IMPRESSION: Small right pleural effusion with probable adjacent right basilar atelectasis, not significantly changed. No other focal airspace disease. Electronically Signed   By: Valetta Mole M.D.   On: 05/27/2022 11:43   DG CHEST PORT 1 VIEW  Result Date: 05/22/2022 CLINICAL DATA:  Endotracheal tube placement. EXAM: PORTABLE CHEST 1 VIEW COMPARISON:  Chest radiograph performed 05/21/2022. FINDINGS: an endotracheal tube terminates 2.7 cm from the carina. An enteric tube terminates in the stomach. The heart is borderline enlarged. There is a small right pleural effusion. There is mild bibasilar atelectasis. There is no left pleural effusion. There is no pneumothorax on either side. IMPRESSION: 1. Endotracheal tube terminates 2.7 cm from the carina. 2. Small right pleural effusion. 3. Mild bibasilar atelectasis. Electronically Signed   By: Zerita Boers M.D.   On: 05/22/2022 17:36   DG CHEST PORT 1 VIEW  Result Date: 05/22/2022 CLINICAL DATA:  Central line placement. EXAM: PORTABLE CHEST 1 VIEW COMPARISON:  Chest radiograph performed the same day. FINDINGS: A left subclavian approach central venous catheter tip overlies the left brachiocephalic vein/superior vena cava junction. An endotracheal tube terminates 2.8 cm from the carina. Two enteric tubes terminate in the stomach. The heart is borderline enlarged. There is a small right pleural effusion. There is mild bibasilar atelectasis. There is no left pleural effusion. There is no pneumothorax on either side. IMPRESSION: 1. Left subclavian central venous catheter tip overlies the left brachiocephalic vein/superior vena cava junction. Electronically Signed   By: Zerita Boers M.D.   On: 05/22/2022 17:34   DG CHEST PORT 1 VIEW  Result Date: 05/21/2022 CLINICAL DATA:  Follow-up after surgery. EXAM: PORTABLE CHEST 1 VIEW COMPARISON:  May 20, 2022 FINDINGS: The ETT is in good position. The NG and feeding tube are not well seen distally due to poor penetration. Cardiomediastinal silhouette is stable. Opacity and possible associated layering effusion in the right base are stable. Pulmonary venous congestion not excluded. No overt edema. No other acute abnormalities. IMPRESSION: 1. Support apparatus as above. 2. Stable opacity and possible associated layering effusion in the right base. 3. Pulmonary venous congestion not excluded. Electronically Signed   By: Dorise Bullion III M.D.   On: 05/21/2022 09:38   DG Abd Portable 1V  Result Date: 05/20/2022 CLINICAL DATA:  Enteric catheter placement EXAM: PORTABLE ABDOMEN - 1 VIEW COMPARISON:  None Available. FINDINGS: Frontal view of the lower chest and upper abdomen demonstrate 2 enteric catheters passing below the diaphragm. The tip of the nasogastric tube overlies the gastric body. The weighted tip of an enteric feeding catheter overlies the proximal duodenum. Retained contrast within the gastric fundus. IMPRESSION: 1. Enteric catheters as above, with tips overlying the gastric body and proximal duodenum as above. Electronically Signed   By: Randa Ngo M.D.   On: 05/20/2022 15:00   DG CHEST PORT 1 VIEW  Result Date: 05/20/2022 CLINICAL DATA:  [id number] Endotracheally intubated [id number] Aspiration into airway [id number] EXAM: PORTABLE CHEST 1 VIEW COMPARISON:  None Available. FINDINGS: Endotracheal tube overlies the midthoracic trachea. Orogastric tube passes below the diaphragm, tip excluded by collimation. Unchanged cardiomediastinal silhouette. Unchanged cardiomediastinal silhouette. There is increased airspace disease throughout the right lung in the medial left lung base. Small right pleural effusion. No evidence of pneumothorax. No acute osseous abnormality. IMPRESSION: Increased airspace disease throughout the right lung and in the left medial lung base consistent with pulmonary edema  and/or infection. Endotracheal tube overlies the  midthoracic trachea. Electronically Signed   By: Maurine Simmering M.D.   On: 05/20/2022 08:17   DG CHEST PORT 1 VIEW  Result Date: 05/19/2022 CLINICAL DATA:  [id number] Patient ventilator dyssynchrony ([place]) [id number] EXAM: PORTABLE CHEST 1 VIEW.  Patient is rotated. COMPARISON:  Chest x-ray 05/17/2022, CT angio chest 05/16/2020, CT angio chest 05/12/2022 FINDINGS: Endotracheal tube approximately 5-7 cm above the carina with limited evaluation due to patient rotation. Enteric tube noted overlying the mediastinum with tube at least reaching the level of the distal esophagus. Enteric tube possibly coursing below the hemidiaphragm. Enlarged heart. Prominent ascending thoracic aorta and aortic arch likely due to AP portable technique and positioning. Unchanged cardiomediastinal silhouette. Interval increase in the prominence of the hilar vasculature. Developing left lower lobe interstitial and airspace opacity. Increased interstitial markings diffusely. Possible bilateral trace pleural effusions. No pneumothorax. No acute osseous abnormality. IMPRESSION: 1. Persistent cardiomegaly with prominent hilar vasculature increased markings suggestive of pulmonary edema. Possible bilateral trace pleural effusions. 2. Developing left lower lobe interstitial and airspace opacity. 3. Endotracheal tube approximately 5-7 cm above the carina with limited evaluation due to patient rotation. Attention on follow-up. 4. Enteric tube noted overlying the mediastinum with tube at least reaching the level of the distal esophagus. Enteric tube possibly coursing below the hemidiaphragm. Recommend x-ray abdomen for further evaluation. Electronically Signed   By: Iven Finn M.D.   On: 05/19/2022 22:10   ECHOCARDIOGRAM COMPLETE  Result Date: 05/18/2022    ECHOCARDIOGRAM REPORT   Patient Name:   Frank Shelton. Date of Exam: 05/18/2022 Medical Rec #:  [id number]          Height:       76.0 in Accession  #:    [id number]         Weight:       395.1 lb Date of Birth:  26-Jul-1960          BSA:          2.958 m Patient Age:    62 years           BP:           114/77 mmHg Patient Gender: M                  HR:           75 bpm. Exam Location:  Inpatient Procedure: 2D Echo Indications:    acute systolic chf  History:        Patient has prior history of Echocardiogram examinations, most                 recent 05/13/2022. Risk Factors:Hypertension, Diabetes and Sleep                 Apnea.  Sonographer:    Johny Chess RDCS Referring Phys: [id number] PETER E BABCOCK  Sonographer Comments: Patient is obese, echo performed with patient supine and on artificial respirator and Technically difficult study due to poor echo windows. Image acquisition challenging due to patient body habitus. IMPRESSIONS  1. Left ventricular ejection fraction, by estimation, is 50 to 55%. The left ventricle has low normal function. The left ventricle has no regional wall motion abnormalities. Left ventricular diastolic parameters are consistent with Grade I diastolic dysfunction (impaired relaxation).  2. Right ventricular systolic function is normal. The right ventricular size is normal.  3. The mitral valve was not well visualized. No evidence of mitral valve regurgitation.  4. The aortic valve was not  well visualized. Aortic valve regurgitation is mild.  5. Technically difficult study. FINDINGS  Left Ventricle: Left ventricular ejection fraction, by estimation, is 50 to 55%. The left ventricle has low normal function. The left ventricle has no regional wall motion abnormalities. The left ventricular internal cavity size was normal in size. Suboptimal image quality limits for assessment of left ventricular hypertrophy. Left ventricular diastolic parameters are consistent with Grade I diastolic dysfunction (impaired relaxation). Right Ventricle: The right ventricular size is normal. No increase in right ventricular wall thickness. Right ventricular  systolic function is normal. Left Atrium: Left atrial size was normal in size. Right Atrium: Right atrial size was normal in size. Pericardium: There is no evidence of pericardial effusion. Presence of epicardial fat layer. Mitral Valve: The mitral valve was not well visualized. No evidence of mitral valve regurgitation. Tricuspid Valve: The tricuspid valve is not well visualized. Tricuspid valve regurgitation is not demonstrated. Aortic Valve: The aortic valve was not well visualized. Aortic valve regurgitation is mild. Pulmonic Valve: The pulmonic valve was not well visualized. Pulmonic valve regurgitation is not visualized. Aorta: The aortic root was not well visualized, the ascending aorta was not well visualized and the aortic arch was not well visualized. IAS/Shunts: No atrial level shunt detected by color flow Doppler.   LV Volumes (MOD) LV vol d, MOD A4C: 97.1 ml Diastology LV vol s, MOD A4C: 45.5 ml LV e' medial:    6.74 cm/s LV SV MOD A4C:     97.1 ml LV E/e' medial:  7.6                            LV e' lateral:   7.72 cm/s                            LV E/e' lateral: 6.7  RIGHT VENTRICLE             IVC RV Basal diam:  2.90 cm     IVC diam: 1.90 cm RV S prime:     10.80 cm/s TAPSE (M-mode): 2.0 cm LEFT ATRIUM             Index        RIGHT ATRIUM           Index LA Vol (A2C):   47.7 ml 16.12 ml/m  RA Area:     17.80 cm LA Vol (A4C):   43.7 ml 14.77 ml/m  RA Volume:   47.20 ml  15.95 ml/m LA Biplane Vol: 49.3 ml 16.66 ml/m  AORTIC VALVE LVOT Vmax:   91.50 cm/s LVOT Vmean:  59.800 cm/s LVOT VTI:    0.210 m MITRAL VALVE MV Area (PHT): 2.80 cm    SHUNTS MV Decel Time: 271 msec    Systemic VTI: 0.21 m MV E velocity: 51.40 cm/s MV A velocity: 68.40 cm/s MV E/A ratio:  0.75 Rudean Haskell MD Electronically signed by Rudean Haskell MD Signature Date/Time: 05/18/2022/4:55:33 PM    Final    US RENAL  Result Date: 05/18/2022 CLINICAL DATA:  Acute kidney injury. EXAM: RENAL / URINARY TRACT  ULTRASOUND COMPLETE COMPARISON:  CT abdomen and pelvis 11/08/2021 FINDINGS: The examination was severely limited by large body habitus, bowel gas, and patient sedation which limited mobility. Right Kidney: The right kidney could not be visualized, with note made of difficulty penetrating the liver. Left Kidney: Renal measurements: 12.3 x 7.8 x 8.3  cm = volume: 412 mL. No hydronephrosis or gross mass. Bladder: Decompressed.  Foley catheter in place. Other: None. IMPRESSION: 1. Severely limited examination with inability to visualize the right kidney. 2. No left hydronephrosis. Electronically Signed   By: Sebastian Ache M.D.   On: 05/18/2022 09:39   US Venous Img Lower Bilateral (DVT)  Result Date: 05/17/2022 CLINICAL DATA:  Follow-up lower extremity DVT. EXAM: BILATERAL LOWER EXTREMITY VENOUS DOPPLER ULTRASOUND TECHNIQUE: Gray-scale sonography with graded compression, as well as color Doppler and duplex ultrasound were performed to evaluate the lower extremity deep venous systems from the level of the common femoral vein and including the common femoral, femoral, profunda femoral, popliteal and calf veins including the posterior tibial, peroneal and gastrocnemius veins when visible. The superficial great saphenous vein was also interrogated. Spectral Doppler was utilized to evaluate flow at rest and with distal augmentation maneuvers in the common femoral, femoral and popliteal veins. COMPARISON:  Bilateral lower extremity venous Doppler ultrasound - 05/13/2022 (positive for occlusive DVT extending from the left common femoral vein through the left tibial veins; positive for chronic nonocclusive SVT involving the right greater saphenous vein) Left lower extremity venous Doppler ultrasound - 01/12/2012 (positive for nonocclusive chronic DVT involving the left popliteal vein). FINDINGS: Examination is degraded due to patient body habitus and poor sonographic window. RIGHT LOWER EXTREMITY Common Femoral Vein: No  evidence of thrombus. Normal compressibility, respiratory phasicity and response to augmentation. Saphenofemoral Junction: Grossly unchanged near occlusive thrombus involving the right saphenofemoral junction (images 7 and 8). Profunda Femoral Vein: Evidence of thrombus. Normal compressibility, respiratory phasicity and response to augmentation. Femoral Vein: Nonocclusive wall thickening/chronic DVT involving the mid (image 16) and distal (image 20) aspects of the right femoral vein. Popliteal Vein: There is mixed echogenic nonocclusive DVT involving the right popliteal vein (image 23). Calf Veins: Appear patent where visualized. Superficial Great Saphenous Vein: Grossly unchanged chronic mixed echogenic nonocclusive thrombus involving the greater saphenous vein at the level of the distal thigh (image 34), knee (image 31 and 32) and calf (image 30) Other Findings:  None. LEFT LOWER EXTREMITY Common Femoral Vein: Restored patency of the left common femoral vein without evidence of acute or chronic DVT. Saphenofemoral Junction: No evidence of thrombus. Normal compressibility and flow on color Doppler imaging. Profunda Femoral Vein: Restored patency without evidence of acute or chronic DVT. Femoral Vein: Redemonstrated near occlusive DVT involving the proximal (image 59), mid (image 62) and distal (image 66) aspects of the left femoral vein Popliteal Vein: Redemonstrated nonocclusive wall thickening involving the left popliteal vein (image 70), unchanged. Calf Veins: Redemonstrated occlusive DVT involving both the left posterior tibial and peroneal veins (image 87), unchanged. Superficial Great Saphenous Vein: Redemonstrated chronic occlusive superficial thrombophlebitis involving the greater saphenous vein from the level of the distal thigh through the knee, unchanged. Other Findings:  None. IMPRESSION: 1. Significantly degraded examination secondary to patient body habitus and poor sonographic window. 2. Overall  improved left lower extremity clot burden with residual DVT involving the left femoral and tibial veins. No definitive evidence of worsening DVT within the left lower extremity. 3. 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 This was most likely present on most recent bilateral lower extremity venous Doppler ultrasound performed 05/13/2022 though suboptimally visualized on that examination secondary to poor sonographic window. 4. Chronic nonocclusive superficial thrombophlebitis involving the bilateral greater saphenous veins. Electronically Signed   By: Holland Commons.D.  On: 05/17/2022 15:05   DG CHEST PORT 1 VIEW  Result Date: 05/17/2022 CLINICAL DATA:  Shortness of breath. EXAM: PORTABLE CHEST 1 VIEW COMPARISON:  May 14, 2022. FINDINGS: Stable cardiomegaly. Mild central pulmonary vascular congestion is noted. Endotracheal and nasogastric tubes appear to be in grossly good position. Right basilar atelectasis is noted with associated effusion. Bony thorax is unremarkable. IMPRESSION: Cardiomegaly with central pulmonary vascular congestion. Stable support apparatus. Right basilar opacity as described above. Electronically Signed   By: Marijo Conception M.D.   On: 05/17/2022 08:21      Subjective: He denies any complaints today, excited about going home, no significant events overnight.  Discharge Exam: Vitals:   06/14/22 0425 06/14/22 0723  BP: 111/70 109/66  Pulse: 82 78  Resp:  18  Temp: 97.7 F (36.5 C) (!) 97.5 F (36.4 C)  SpO2: 92% 95%   Vitals:   06/13/22 2032 06/14/22 0007 06/14/22 0425 06/14/22 0723  BP: 126/85 131/62 111/70 109/66  Pulse: 83 84 82 78  Resp: 18 18  18   Temp: 97.8 F (36.6 C) 97.7 F (36.5 C) 97.7 F (36.5 C) (!) 97.5 F (36.4 C)  TempSrc: Oral Oral Oral Oral  SpO2: 97% 97% 92% 95%  Weight:      Height:        General: Pt is alert, awake, not in acute  distress Cardiovascular: RRR, S1/S2 +, no rubs, no gallops Respiratory: CTA bilaterally, no wheezing, no rhonchi Abdominal: Soft, NT, ND, bowel sounds + Extremities: no edema, no cyanosis    The results of significant diagnostics from this hospitalization (including imaging, microbiology, ancillary and laboratory) are listed below for reference.     Microbiology: No results found for this or any previous visit (from the past 240 hour(s)).   Labs: BNP (last 3 results) Recent Labs    06/07/22 0725 06/08/22 0804 06/09/22 0455  BNP 40.2 34.4 [id number]   Basic Metabolic Panel: Recent Labs  Lab 06/08/22 0804 06/09/22 0455 06/10/22 0645 06/11/22 0254  NA 143 140 137 137  K 3.9 3.8 3.7 3.5  CL 115* 113* 111 112*  CO2 19* 20* 18* 19*  GLUCOSE 163* 161* 162* 141*  BUN 48* 39* 35* 31*  CREATININE 2.21* 1.92* 1.95* 1.78*  CALCIUM 8.2* 8.1* 8.3* 8.5*  MG 2.2 2.0  --   --    Liver Function Tests: Recent Labs  Lab 06/08/22 0804 06/09/22 0455 06/10/22 0645  AST 36 35 38  ALT 33 33 36  ALKPHOS 66 67 68  BILITOT 1.2 0.8 0.8  PROT 6.7 6.6 6.8  ALBUMIN 2.6* 2.6* 2.7*   No results for input(s): "LIPASE", "AMYLASE" in the last 168 hours. No results for input(s): "AMMONIA" in the last 168 hours. CBC: Recent Labs  Lab 06/10/22 0645 06/11/22 0254  WBC 6.4 6.7  HGB 10.1* 10.2*  HCT 32.8* 31.7*  MCV 100.0 97.2  PLT 245 247   Cardiac Enzymes: No results for input(s): "CKTOTAL", "CKMB", "CKMBINDEX", "TROPONINI" in the last 168 hours. BNP: Invalid input(s): "POCBNP" CBG: Recent Labs  Lab 06/13/22 1243 06/13/22 1806 06/13/22 2116 06/13/22 2207 06/14/22 0749  GLUCAP 186* 145* 157* 135* 124*   D-Dimer No results for input(s): "DDIMER" in the last 72 hours. Hgb A1c No results for input(s): "HGBA1C" in the last 72 hours. Lipid Profile No results for input(s): "CHOL", "HDL", "LDLCALC", "TRIG", "CHOLHDL", "LDLDIRECT" in the last 72 hours. Thyroid function studies No  results for input(s): "TSH", "T4TOTAL", "T3FREE", "THYROIDAB" in the last  72 hours.  Invalid input(s): "FREET3" Anemia work up No results for input(s): "VITAMINB12", "FOLATE", "FERRITIN", "TIBC", "IRON", "RETICCTPCT" in the last 72 hours. Urinalysis    Component Value Date/Time   COLORURINE YELLOW 11/08/2021 2120   APPEARANCEUR CLEAR 11/08/2021 2120   LABSPEC 1.011 11/08/2021 2120   PHURINE 6.0 11/08/2021 2120   GLUCOSEU NEGATIVE 11/08/2021 2120   HGBUR SMALL (A) 11/08/2021 2120   [place] NEGATIVE 11/08/2021 2120   [place] NEGATIVE 11/08/2021 2120   PROTEINUR NEGATIVE 11/08/2021 2120   NITRITE NEGATIVE 11/08/2021 2120   LEUKOCYTESUR TRACE (A) 11/08/2021 2120   Sepsis Labs Recent Labs  Lab 06/10/22 0645 06/11/22 0254  WBC 6.4 6.7   Microbiology No results found for this or any previous visit (from the past 240 hour(s)).   Time coordinating discharge: Over 30 minutes  SIGNED:   Phillips Climes, MD  [facility] 06/14/2022, 9:19 AM Pager   If 7PM-7AM, please contact night-coverage [email or web address]

## 2022-06-14 NOTE — Discharge Summary (Signed)
Physician Discharge Summary  Frank Shelton. 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  He had some bleeding from his groin at the site of arterial stick in ICU, he also went into AKI.  He was seen by [facility] and nephrology, he was intubated for several days finally extubated on 05/28/2022, he also developed pneumonia for which she finished on antibiotics on 05/29/2022.  He is currently diffusely weak, has NG tube for feeding. He was transferred to hospitalist service on 05/30/2022, diffusely weak, bedbound with NG tube feed dependence.  with  severe hypernatremia with improving renal function.  Clinically improved, tolerating heart/carb modified diet, renal function much improved, and hypernatremia has resolved.   Significant Hospital Events: 2023/05/31: Cardiac arrest while in BR. CPR  initiated. TNK administered during CPR, fem cvl  placed/ post arrest shock, high dose pressors. Failed attempt on right femoral and right IJ access. Time to ROSC estimated over 30 minutes. Hgb dropped 12 to 8.8  12/31 on 3 pressors and stress dose steroids. In DKA. Insulin gtt started. LMWH placed on hold.  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 Unasyn started, subsequently was switched to meropenem in ICU finished all his antibiotics on 05/29/2022. 1/3 transferred to [facility] central venous catheter removed.  Blood cultures repeated.  Repeat echo: EF 50 to 55% no regional wall abnormality grade 1 diastolic dysfunction RV normal Renal function improving with Lasix.  Weaning pressors.  Adding Seroquel and clonazepam to assist with weaning 1/7 ETT exchange, bronch for ongoing hemoptysis 1/10 no hemoptysis; resuming heparin today; off levo 1/11 starting to wake up a bit, nephro decr lasix and incr FWF   1/12 more mucus, having incr peaks, changing sedation  1/13 finally awake following commands off vasopressors passed SBT.  Trial of extubation. 1/15 transferred to [id number]. 1/16 TSH elevated at 55, started on IV levothyroxine.  1/19 Pro cal at 0.14, CRP down to 1.4, BNP normal. TSH still elevated at 57, FT4 normal 1/23 NG tube clamped. D1D started. 1/25 he was advanced to heart/carb modified diet, NG tube is discontinued.  Has been compliant and improving with physical therapy   Acute PE in a patient with hypercoagulable state who was noncompliant with his Xarelto for a few days at home. Complicated by PEA arrest.  Acute respiratory failure Ventilator associated pneumonia - He has been treated in ICU with Altepase and anticoagulation, required endotracheal intubation and mechanical ventilation for several days, remained intubated for close to 10 days and finally extubated on 05/28/2022.  Also developed hospital/ventilator acquired pneumonia for which she has finished all his antibiotics on 05/29/2022. Is currently on oral Eliquis for his DVT PE. Satting well on RA. Hemodynamically stable and  improving.   Myxedema with history of hypothyroidism   Toxic and metabolic encephalopathy -Mental status much improved, no focal deficits, but remains generally weak and deconditioned. -Initially requiring IV Synthroid, transitioned to po  Synthroid. -As well he had PEA arrest, SLP evaluation for cognitive dysfunction, he scored 17/3 -Overall patient continues to have significant improvement with deconditioning, and mentation over the last few days.   Dysphagia -Much improved, advanced to heart/carb    AKI, dehydration and hypernatremia.  Seen by nephrology. Overall improving.  - Intravascularly appears dehydrated, holding lasix. Sodium normalized.  Creatinine peaked at 9.1 -Started on oral bicarb given low bicarb level -Continues to improve most recent creatinine level is 1.7,    DM type II.   -Discharged on Lantus       Paroxysmal atrial fibrillation.  Mali vas 2 score of greater than 4.   -Pt was on amiodarone and Eliquis combination but Amiodarone dced 01/19. Will continue Eliquis. Pt in NSR.  He remains on metoprolol.   Hypertension.   Chronic. Normotensive continue with metoprolol   anemia of acute/critical illness along with some blood loss initially in ICU due to right groin hematoma at the site of arterial stick.  Stable    COPD and OSA.   Chronic. Supportive care nighttime CPAP.   Morbid obesity.  BMI 43.   Chronic. Follow-up with PCP for weight loss.  Discharge Diagnoses:  Principal Problem:   Pulmonary embolism (HCC) Active Problems:   GOUT   OBSTRUCTIVE SLEEP APNEA   Essential hypertension   Other specified chronic obstructive pulmonary disease   Hypercoagulopathy (HCC)   DM type 2 causing vascular disease (HCC)   Upper GI bleed   Coffee ground emesis   Acute respiratory failure with hypoxia and hypercapnia (HCC)   Shock ([place])   Acute respiratory failure ([place])   Enterobacter cloacae pneumonia   Septic shock ([place])   AKI (acute kidney injury) ([place])   Encephalopathy acute   Severe sepsis [facility])    Discharge Instructions  Discharge Instructions     Diet - low sodium heart healthy   Complete by: As directed    Discharge instructions   Complete by: As directed    Follow with Primary  MD [name], [name], FNP in 7 days   Get CBC, CMP, 2 view Chest X ray checked  by Primary MD next visit.    Activity: As tolerated with Full fall precautions use walker/cane & assistance as needed   Disposition Home    Diet: Heart Healthy /carb modified   On your next visit with your primary care physician please Get Medicines reviewed and adjusted.   Please request your Prim.MD to go over all Hospital Tests and Procedure/Radiological results at the follow up, please get all Hospital records sent to your Prim MD by signing hospital release before you go home.   If you experience worsening of your admission symptoms, develop shortness of breath, life threatening emergency, suicidal or homicidal thoughts you must seek medical attention immediately by calling 911 or calling your MD immediately  if symptoms less severe.  You Must read complete instructions/literature along with all the possible adverse reactions/side effects for all the Medicines you take and that have been prescribed to you. Take any new Medicines after you have completely understood and accpet all the possible adverse reactions/side effects.   Do not drive, operating heavy machinery, perform activities at heights, swimming or participation in water activities or provide baby sitting services if your were admitted for  syncope or siezures until you have seen by Primary MD or a Neurologist and advised to do so again.  Do not drive when taking Pain medications.    Do not take more than prescribed Pain, Sleep and Anxiety Medications  Special Instructions: If you have smoked or chewed Tobacco  in the last 2 yrs please stop smoking, stop any regular Alcohol  and or any Recreational drug use.  Wear Seat belts while driving.   Please note  You were cared for by a hospitalist during your hospital stay. If you have any questions about your discharge medications or the care you received while you were in the hospital after  you are discharged, you can call the unit and asked to speak with the hospitalist on call if the hospitalist that took care of you is not available. Once you are discharged, your primary care physician will handle any further medical issues. Please note that NO REFILLS for any discharge medications will be authorized once you are discharged, as it is imperative that you return to your primary care physician (or establish a relationship with a primary care physician if you do not have one) for your aftercare needs so that they can reassess your need for medications and monitor your lab values.   Increase activity slowly   Complete by: As directed    No wound care   Complete by: As directed       Allergies as of 06/14/2022       Reactions   Nabumetone Hives        Medication List     STOP taking these medications    allopurinol 300 MG tablet Commonly known as: ZYLOPRIM   aspirin EC 81 MG tablet   calcium carbonate 500 MG chewable tablet Commonly known as: Tums   isosorbide mononitrate 30 MG 24 hr tablet Commonly known as: IMDUR   Xarelto 20 MG Tabs tablet Generic drug: rivaroxaban       TAKE these medications    acetaminophen 325 MG tablet Commonly known as: TYLENOL Take 650 mg by mouth every 6 (six) hours as needed for moderate pain or headache.   albuterol 108 (90 Base) MCG/ACT inhaler Commonly known as: VENTOLIN HFA Inhale 2 puffs into the lungs every 4 (four) hours as needed for wheezing or shortness of breath.   apixaban 5 MG Tabs tablet Commonly known as: ELIQUIS Take 1 tablet (5 mg total) by mouth 2 (two) times daily.   ascorbic acid 500 MG tablet Commonly known as: VITAMIN C Take 500 mg by mouth daily.   atorvastatin 40 MG tablet Commonly known as: LIPITOR Take 40 mg by mouth daily.   B-12 5000 MCG Caps Take 5,000 mcg by mouth daily.   budesonide-formoterol 160-4.5 MCG/ACT inhaler Commonly known as: SYMBICORT Inhale 2 puffs into the lungs 2 (two)  times daily as needed (for shortness of breath).   calcium-vitamin D 500-200 MG-UNIT tablet Commonly known as: Oscal 500/200 D-3 Take 1 tablet by mouth 2 (two) times daily.   colchicine 0.6 MG tablet Take 0.6 mg by mouth daily as needed (for gout flare/pain).   furosemide 20 MG tablet Commonly known as: LASIX Take 20 mg by mouth daily as needed.   insulin glargine 100 UNIT/ML Solostar Pen Commonly known as: LANTUS Inject 25 Units into the skin daily.   Insulin Pen Needle 32G X 4 MM Misc Use to inject insulin.   levothyroxine 125 MCG tablet Commonly known as: SYNTHROID Take 2 tablets (250 mcg  total) by mouth daily at 6 (six) AM. Start taking on: June 15, 2022 What changed:  medication strength how much to take when to take this   methocarbamol 500 MG tablet Commonly known as: ROBAXIN Take 1 tablet (500 mg total) by mouth 2 (two) times daily as needed for muscle spasms.   metoprolol tartrate 25 MG tablet Commonly known as: LOPRESSOR Take 1 tablet (25 mg total) by mouth 2 (two) times daily.   multivitamin with minerals Tabs tablet Take 1 tablet by mouth daily.   sodium bicarbonate 650 MG tablet Take 1 tablet (650 mg total) by mouth daily.   Vitamin D3 125 MCG (5000 UT) Caps Take 5,000 Units by mouth 2 (two) times daily.               Durable Medical Equipment  (From admission, onward)           Start     Ordered   06/13/22 1509  For home use only DME Hospital bed  Once       Comments: BARIATRIC  330# 6'4"  Question Answer Comment  Length of Need 6 Months   The above medical condition requires: Patient requires the ability to reposition frequently   Head must be elevated greater than: 30 degrees   Bed type Semi-electric   Hoyer Lift Yes      06/13/22 1509   06/13/22 1505  For home use only DME Bedside commode  Once       Comments: BARIATRIC  330# 6'4"  Question:  Patient needs a bedside commode to treat with the following condition  Answer:   Weakness   06/13/22 1504   06/13/22 1505  For home use only DME standard manual wheelchair with seat cushion  Once       Comments: BARIATRIC  330# 6'4"    Patient suffers from weakness which impairs their ability to perform daily activities like  in the home.  A walker will not resolve issue with performing activities of daily living. A wheelchair will allow patient to safely perform daily activities. Patient can safely propel the wheelchair in the home or has a caregiver who can provide assistance. Length of need 6 months . Accessories: elevating leg rests (ELRs), wheel locks, extensions and anti-tippers.   06/13/22 1509            Allergies  Allergen Reactions   Nabumetone Hives    Consultations: PCCM renal   Procedures/Studies: DG Chest Port 1 View  Result Date: 06/04/2022 CLINICAL DATA:  Shortness of breath. EXAM: PORTABLE CHEST 1 VIEW COMPARISON:  05/31/2022 FINDINGS: Enteric tube unchanged with tip over the right upper quadrant. Lungs are adequately inflated without focal airspace consolidation or effusion. Subtle hazy prominence of the central pulmonary vessels with interval improvement. Mild stable cardiomegaly. Remainder of the exam is unchanged. IMPRESSION: 1. Mild stable cardiomegaly with suggestion of minimal vascular congestion. 2. Enteric tube unchanged with tip over the right upper quadrant. Electronically Signed   By: Elberta Fortisaniel  Boyle M.D.   On: 06/04/2022 07:59   CT HEAD WO CONTRAST (5MM)  Result Date: 05/31/2022 CLINICAL DATA:  Delirium. EXAM: CT HEAD WITHOUT CONTRAST TECHNIQUE: Contiguous axial images were obtained from the base of the skull through the vertex without intravenous contrast. RADIATION DOSE REDUCTION: This exam was performed according to the departmental dose-optimization program which includes automated exposure control, adjustment of the mA and/or kV according to patient size and/or use of iterative reconstruction technique. COMPARISON:  None Available.  FINDINGS:  Brain: No midline shift, ventriculomegaly, mass effect, evidence of mass lesion, intracranial hemorrhage or evidence of cortically based acute infarction. Gray-white matter differentiation is within normal limits throughout the brain. Vascular: No hyperdense vessel. Skull: No suspicious bone lesions.  Skull base is unremarkable. Sinuses/Orbits: Near-complete opacification of the sphenoid sinuses. Partially visualized nasal enteric tube. Trace fluid in the bilateral mastoids. Other: None. IMPRESSION: No acute intracranial abnormality. Electronically Signed   By: Orvan Falconer M.D   On: 05/31/2022 09:55   DG Abd Portable 1V  Result Date: 05/31/2022 CLINICAL DATA:  Constipation EXAM: PORTABLE ABDOMEN - 1 VIEW COMPARISON:  Plain film of the abdomen dated 05/20/2022. FINDINGS: Enteric tube projects into the proximal duodenum. Visualized portion of the bowel gas pattern is unremarkable no dilated large or small bowel loops appreciated. Portion of the upper abdomen and RIGHT abdomen is excluded. Moderate amount of stool is seen within the visualized portion of the colon. No evidence of free intraperitoneal air is seen. Probable RIGHT renal stone. IVC filter in place. IMPRESSION: 1. Nonobstructive bowel gas pattern, although portions of the bowel are excluded on this exam. Moderate amount of stool is seen within the visualized portion of the colon. 2. Enteric tube projects into the proximal duodenum. 3. Probable RIGHT renal stone. Electronically Signed   By: Bary Richard M.D.   On: 05/31/2022 09:20   DG Chest Port 1 View  Result Date: 05/31/2022 CLINICAL DATA:  Shortness of breath, constipation. EXAM: PORTABLE CHEST 1 VIEW COMPARISON:  Chest x-rays dated 05/30/2022, 05/27/2022 and 05/14/2022. Chest CT dated 05/12/2022. FINDINGS: Stable cardiomegaly. Central pulmonary vascular congestion and mild diffuse interstitial prominence. Probable mild atelectasis and/or small pleural effusion at the LEFT lung  base. No pneumothorax is seen. Enteric tube passes below the diaphragm. IMPRESSION: 1. Cardiomegaly with central pulmonary vascular congestion and mild diffuse interstitial prominence, compatible with CHF/volume overload. 2. Probable mild atelectasis and/or small pleural effusion at the LEFT lung base. Electronically Signed   By: Bary Richard M.D.   On: 05/31/2022 09:17   DG Chest Port 1 View  Result Date: 05/30/2022 CLINICAL DATA:  62 year old male with shortness of breath. EXAM: PORTABLE CHEST 1 VIEW COMPARISON:  Portable chest 05/27/2022 and earlier. FINDINGS: Portable AP semi upright views at 0653 hours. Enteric feeding tube in place and courses into the proximal duodenum, tip not included. Mildly lower lung volumes. Mild cardiomegaly. Stable cardiac size and mediastinal contours. Allowing for portable technique the lungs are clear. No pneumothorax or pleural effusion. Surgical clips at thoracic inlet, probably thyroidectomy related. No acute osseous abnormality identified. Paucity of bowel gas in the visible abdomen. IMPRESSION: 1. Lower lung volumes, no acute cardiopulmonary abnormality. 2. Enteric feeding tube courses into the duodenum, tip not included. Electronically Signed   By: Odessa Fleming M.D.   On: 05/30/2022 07:10   DG CHEST PORT 1 VIEW  Result Date: 05/27/2022 CLINICAL DATA:  Acute respiratory failure with hypoxia. EXAM: PORTABLE CHEST 1 VIEW COMPARISON:  Chest radiograph 05/22/2022 FINDINGS: The endotracheal tube tip is approximately 3.7 cm from the carina. Two enteric catheres are in place, the tips are not seen. A left subclavian venous catheter is seen. The tip is partially obscured but likely terminates in the upper SVC. The heart is enlarged, unchanged. The upper mediastinal contours are prominent, also unchanged. There is vascular congestion without definite overt pulmonary edema. A small right pleural effusion is again seen with probable right basilar atelectasis. There is no other focal  airspace disease. There is no significant  left effusion. There is no pneumothorax There is no acute osseous abnormality. IMPRESSION: Small right pleural effusion with probable adjacent right basilar atelectasis, not significantly changed. No other focal airspace disease. Electronically Signed   By: Lesia Hausen M.D.   On: 05/27/2022 11:43   DG CHEST PORT 1 VIEW  Result Date: 05/22/2022 CLINICAL DATA:  Endotracheal tube placement. EXAM: PORTABLE CHEST 1 VIEW COMPARISON:  Chest radiograph performed 05/21/2022. FINDINGS: an endotracheal tube terminates 2.7 cm from the carina. An enteric tube terminates in the stomach. The heart is borderline enlarged. There is a small right pleural effusion. There is mild bibasilar atelectasis. There is no left pleural effusion. There is no pneumothorax on either side. IMPRESSION: 1. Endotracheal tube terminates 2.7 cm from the carina. 2. Small right pleural effusion. 3. Mild bibasilar atelectasis. Electronically Signed   By: Romona Curls M.D.   On: 05/22/2022 17:36   DG CHEST PORT 1 VIEW  Result Date: 05/22/2022 CLINICAL DATA:  Central line placement. EXAM: PORTABLE CHEST 1 VIEW COMPARISON:  Chest radiograph performed the same day. FINDINGS: A left subclavian approach central venous catheter tip overlies the left brachiocephalic vein/superior vena cava junction. An endotracheal tube terminates 2.8 cm from the carina. Two enteric tubes terminate in the stomach. The heart is borderline enlarged. There is a small right pleural effusion. There is mild bibasilar atelectasis. There is no left pleural effusion. There is no pneumothorax on either side. IMPRESSION: 1. Left subclavian central venous catheter tip overlies the left brachiocephalic vein/superior vena cava junction. Electronically Signed   By: Romona Curls M.D.   On: 05/22/2022 17:34   DG CHEST PORT 1 VIEW  Result Date: 05/21/2022 CLINICAL DATA:  Follow-up after surgery. EXAM: PORTABLE CHEST 1 VIEW COMPARISON:  May 20, 2022 FINDINGS: The ETT is in good position. The NG and feeding tube are not well seen distally due to poor penetration. Cardiomediastinal silhouette is stable. Opacity and possible associated layering effusion in the right base are stable. Pulmonary venous congestion not excluded. No overt edema. No other acute abnormalities. IMPRESSION: 1. Support apparatus as above. 2. Stable opacity and possible associated layering effusion in the right base. 3. Pulmonary venous congestion not excluded. Electronically Signed   By: Gerome Sam III M.D.   On: 05/21/2022 09:38   DG Abd Portable 1V  Result Date: 05/20/2022 CLINICAL DATA:  Enteric catheter placement EXAM: PORTABLE ABDOMEN - 1 VIEW COMPARISON:  None Available. FINDINGS: Frontal view of the lower chest and upper abdomen demonstrate 2 enteric catheters passing below the diaphragm. The tip of the nasogastric tube overlies the gastric body. The weighted tip of an enteric feeding catheter overlies the proximal duodenum. Retained contrast within the gastric fundus. IMPRESSION: 1. Enteric catheters as above, with tips overlying the gastric body and proximal duodenum as above. Electronically Signed   By: Sharlet Salina M.D.   On: 05/20/2022 15:00   DG CHEST PORT 1 VIEW  Result Date: 05/20/2022 CLINICAL DATA:  [id number] Endotracheally intubated [id number] Aspiration into airway [id number] EXAM: PORTABLE CHEST 1 VIEW COMPARISON:  None Available. FINDINGS: Endotracheal tube overlies the midthoracic trachea. Orogastric tube passes below the diaphragm, tip excluded by collimation. Unchanged cardiomediastinal silhouette. Unchanged cardiomediastinal silhouette. There is increased airspace disease throughout the right lung in the medial left lung base. Small right pleural effusion. No evidence of pneumothorax. No acute osseous abnormality. IMPRESSION: Increased airspace disease throughout the right lung and in the left medial lung base consistent with pulmonary edema  and/or infection. Endotracheal  tube overlies the midthoracic trachea. Electronically Signed   By: Caprice Renshaw M.D.   On: 05/20/2022 08:17   DG CHEST PORT 1 VIEW  Result Date: 05/19/2022 CLINICAL DATA:  [id number] Patient ventilator dyssynchrony (HCC) [id number] EXAM: PORTABLE CHEST 1 VIEW.  Patient is rotated. COMPARISON:  Chest x-ray 05/17/2022, CT angio chest 05/16/2020, CT angio chest 05/12/2022 FINDINGS: Endotracheal tube approximately 5-7 cm above the carina with limited evaluation due to patient rotation. Enteric tube noted overlying the mediastinum with tube at least reaching the level of the distal esophagus. Enteric tube possibly coursing below the hemidiaphragm. Enlarged heart. Prominent ascending thoracic aorta and aortic arch likely due to AP portable technique and positioning. Unchanged cardiomediastinal silhouette. Interval increase in the prominence of the hilar vasculature. Developing left lower lobe interstitial and airspace opacity. Increased interstitial markings diffusely. Possible bilateral trace pleural effusions. No pneumothorax. No acute osseous abnormality. IMPRESSION: 1. Persistent cardiomegaly with prominent hilar vasculature increased markings suggestive of pulmonary edema. Possible bilateral trace pleural effusions. 2. Developing left lower lobe interstitial and airspace opacity. 3. Endotracheal tube approximately 5-7 cm above the carina with limited evaluation due to patient rotation. Attention on follow-up. 4. Enteric tube noted overlying the mediastinum with tube at least reaching the level of the distal esophagus. Enteric tube possibly coursing below the hemidiaphragm. Recommend x-ray abdomen for further evaluation. Electronically Signed   By: Tish Frederickson M.D.   On: 05/19/2022 22:10   ECHOCARDIOGRAM COMPLETE  Result Date: 05/18/2022    ECHOCARDIOGRAM REPORT   Patient Name:   Frank Shelton. Date of Exam: 05/18/2022 Medical Rec #:  [id number]          Height:       76.0 in Accession  #:    [id number]         Weight:       395.1 lb Date of Birth:  09/02/1960          BSA:          2.958 m Patient Age:    61 years           BP:           114/77 mmHg Patient Gender: M                  HR:           75 bpm. Exam Location:  Inpatient Procedure: 2D Echo Indications:    acute systolic chf  History:        Patient has prior history of Echocardiogram examinations, most                 recent 05/13/2022. Risk Factors:Hypertension, Diabetes and Sleep                 Apnea.  Sonographer:    Delcie Roch RDCS Referring Phys: [id number] PETER E BABCOCK  Sonographer Comments: Patient is obese, echo performed with patient supine and on artificial respirator and Technically difficult study due to poor echo windows. Image acquisition challenging due to patient body habitus. IMPRESSIONS  1. Left ventricular ejection fraction, by estimation, is 50 to 55%. The left ventricle has low normal function. The left ventricle has no regional wall motion abnormalities. Left ventricular diastolic parameters are consistent with Grade I diastolic dysfunction (impaired relaxation).  2. Right ventricular systolic function is normal. The right ventricular size is normal.  3. The mitral valve was not well visualized. No evidence of mitral valve regurgitation.  4. The aortic  valve was not well visualized. Aortic valve regurgitation is mild.  5. Technically difficult study. FINDINGS  Left Ventricle: Left ventricular ejection fraction, by estimation, is 50 to 55%. The left ventricle has low normal function. The left ventricle has no regional wall motion abnormalities. The left ventricular internal cavity size was normal in size. Suboptimal image quality limits for assessment of left ventricular hypertrophy. Left ventricular diastolic parameters are consistent with Grade I diastolic dysfunction (impaired relaxation). Right Ventricle: The right ventricular size is normal. No increase in right ventricular wall thickness. Right ventricular  systolic function is normal. Left Atrium: Left atrial size was normal in size. Right Atrium: Right atrial size was normal in size. Pericardium: There is no evidence of pericardial effusion. Presence of epicardial fat layer. Mitral Valve: The mitral valve was not well visualized. No evidence of mitral valve regurgitation. Tricuspid Valve: The tricuspid valve is not well visualized. Tricuspid valve regurgitation is not demonstrated. Aortic Valve: The aortic valve was not well visualized. Aortic valve regurgitation is mild. Pulmonic Valve: The pulmonic valve was not well visualized. Pulmonic valve regurgitation is not visualized. Aorta: The aortic root was not well visualized, the ascending aorta was not well visualized and the aortic arch was not well visualized. IAS/Shunts: No atrial level shunt detected by color flow Doppler.   LV Volumes (MOD) LV vol d, MOD A4C: 97.1 ml Diastology LV vol s, MOD A4C: 45.5 ml LV e' medial:    6.74 cm/s LV SV MOD A4C:     97.1 ml LV E/e' medial:  7.6                            LV e' lateral:   7.72 cm/s                            LV E/e' lateral: 6.7  RIGHT VENTRICLE             IVC RV Basal diam:  2.90 cm     IVC diam: 1.90 cm RV S prime:     10.80 cm/s TAPSE (M-mode): 2.0 cm LEFT ATRIUM             Index        RIGHT ATRIUM           Index LA Vol (A2C):   47.7 ml 16.12 ml/m  RA Area:     17.80 cm LA Vol (A4C):   43.7 ml 14.77 ml/m  RA Volume:   47.20 ml  15.95 ml/m LA Biplane Vol: 49.3 ml 16.66 ml/m  AORTIC VALVE LVOT Vmax:   91.50 cm/s LVOT Vmean:  59.800 cm/s LVOT VTI:    0.210 m MITRAL VALVE MV Area (PHT): 2.80 cm    SHUNTS MV Decel Time: 271 msec    Systemic VTI: 0.21 m MV E velocity: 51.40 cm/s MV A velocity: 68.40 cm/s MV E/A ratio:  0.75 Riley Lam MD Electronically signed by Riley Lam MD Signature Date/Time: 05/18/2022/4:55:33 PM    Final    US RENAL  Result Date: 05/18/2022 CLINICAL DATA:  Acute kidney injury. EXAM: RENAL / URINARY TRACT  ULTRASOUND COMPLETE COMPARISON:  CT abdomen and pelvis 11/08/2021 FINDINGS: The examination was severely limited by large body habitus, bowel gas, and patient sedation which limited mobility. Right Kidney: The right kidney could not be visualized, with note made of difficulty penetrating the liver. Left Kidney: Renal measurements: 12.3 x  7.8 x 8.3 cm = volume: 412 mL. No hydronephrosis or gross mass. Bladder: Decompressed.  Foley catheter in place. Other: None. IMPRESSION: 1. Severely limited examination with inability to visualize the right kidney. 2. No left hydronephrosis. Electronically Signed   By: Sebastian Ache M.D.   On: 05/18/2022 09:39   US Venous Img Lower Bilateral (DVT)  Result Date: 05/17/2022 CLINICAL DATA:  Follow-up lower extremity DVT. EXAM: BILATERAL LOWER EXTREMITY VENOUS DOPPLER ULTRASOUND TECHNIQUE: Gray-scale sonography with graded compression, as well as color Doppler and duplex ultrasound were performed to evaluate the lower extremity deep venous systems from the level of the common femoral vein and including the common femoral, femoral, profunda femoral, popliteal and calf veins including the posterior tibial, peroneal and gastrocnemius veins when visible. The superficial great saphenous vein was also interrogated. Spectral Doppler was utilized to evaluate flow at rest and with distal augmentation maneuvers in the common femoral, femoral and popliteal veins. COMPARISON:  Bilateral lower extremity venous Doppler ultrasound - 05/13/2022 (positive for occlusive DVT extending from the left common femoral vein through the left tibial veins; positive for chronic nonocclusive SVT involving the right greater saphenous vein) Left lower extremity venous Doppler ultrasound - 01/12/2012 (positive for nonocclusive chronic DVT involving the left popliteal vein). FINDINGS: Examination is degraded due to patient body habitus and poor sonographic window. RIGHT LOWER EXTREMITY Common Femoral Vein: No  evidence of thrombus. Normal compressibility, respiratory phasicity and response to augmentation. Saphenofemoral Junction: Grossly unchanged near occlusive thrombus involving the right saphenofemoral junction (images 7 and 8). Profunda Femoral Vein: Evidence of thrombus. Normal compressibility, respiratory phasicity and response to augmentation. Femoral Vein: Nonocclusive wall thickening/chronic DVT involving the mid (image 16) and distal (image 20) aspects of the right femoral vein. Popliteal Vein: There is mixed echogenic nonocclusive DVT involving the right popliteal vein (image 23). Calf Veins: Appear patent where visualized. Superficial Great Saphenous Vein: Grossly unchanged chronic mixed echogenic nonocclusive thrombus involving the greater saphenous vein at the level of the distal thigh (image 34), knee (image 31 and 32) and calf (image 30) Other Findings:  None. LEFT LOWER EXTREMITY Common Femoral Vein: Restored patency of the left common femoral vein without evidence of acute or chronic DVT. Saphenofemoral Junction: No evidence of thrombus. Normal compressibility and flow on color Doppler imaging. Profunda Femoral Vein: Restored patency without evidence of acute or chronic DVT. Femoral Vein: Redemonstrated near occlusive DVT involving the proximal (image 59), mid (image 62) and distal (image 66) aspects of the left femoral vein Popliteal Vein: Redemonstrated nonocclusive wall thickening involving the left popliteal vein (image 70), unchanged. Calf Veins: Redemonstrated occlusive DVT involving both the left posterior tibial and peroneal veins (image 87), unchanged. Superficial Great Saphenous Vein: Redemonstrated chronic occlusive superficial thrombophlebitis involving the greater saphenous vein from the level of the distal thigh through the knee, unchanged. Other Findings:  None. IMPRESSION: 1. Significantly degraded examination secondary to patient body habitus and poor sonographic window. 2. Overall  improved left lower extremity clot burden with residual DVT involving the left femoral and tibial veins. No definitive evidence of worsening DVT within the left lower extremity. 3. 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 This was most likely present on most recent bilateral lower extremity venous Doppler ultrasound performed 05/13/2022 though suboptimally visualized on that examination secondary to poor sonographic window. 4. Chronic nonocclusive superficial thrombophlebitis involving the bilateral greater saphenous veins. Electronically Signed   By: Jonny Ruiz  Watts M.D.   On: 05/17/2022 15:05   DG CHEST PORT 1 VIEW  Result Date: 05/17/2022 CLINICAL DATA:  Shortness of breath. EXAM: PORTABLE CHEST 1 VIEW COMPARISON:  May 14, 2022. FINDINGS: Stable cardiomegaly. Mild central pulmonary vascular congestion is noted. Endotracheal and nasogastric tubes appear to be in grossly good position. Right basilar atelectasis is noted with associated effusion. Bony thorax is unremarkable. IMPRESSION: Cardiomegaly with central pulmonary vascular congestion. Stable support apparatus. Right basilar opacity as described above. Electronically Signed   By: Lupita Raider M.D.   On: 05/17/2022 08:21      Subjective: He denies any complaints today, excited about going home, no significant events overnight.  Discharge Exam: Vitals:   06/14/22 0425 06/14/22 0723  BP: 111/70 109/66  Pulse: 82 78  Resp:  18  Temp: 97.7 F (36.5 C) (!) 97.5 F (36.4 C)  SpO2: 92% 95%   Vitals:   06/13/22 2032 06/14/22 0007 06/14/22 0425 06/14/22 0723  BP: 126/85 131/62 111/70 109/66  Pulse: 83 84 82 78  Resp: 18 18  18   Temp: 97.8 F (36.6 C) 97.7 F (36.5 C) 97.7 F (36.5 C) (!) 97.5 F (36.4 C)  TempSrc: Oral Oral Oral Oral  SpO2: 97% 97% 92% 95%  Weight:      Height:        General: Pt is alert, awake, not in acute  distress Cardiovascular: RRR, S1/S2 +, no rubs, no gallops Respiratory: CTA bilaterally, no wheezing, no rhonchi Abdominal: Soft, NT, ND, bowel sounds + Extremities: no edema, no cyanosis    The results of significant diagnostics from this hospitalization (including imaging, microbiology, ancillary and laboratory) are listed below for reference.     Microbiology: No results found for this or any previous visit (from the past 240 hour(s)).   Labs: BNP (last 3 results) Recent Labs    06/07/22 0725 06/08/22 0804 06/09/22 0455  BNP 40.2 34.4 38.1    Basic Metabolic Panel: Recent Labs  Lab 06/08/22 0804 06/09/22 0455 06/10/22 0645 06/11/22 0254  NA 143 140 137 137  K 3.9 3.8 3.7 3.5  CL 115* 113* 111 112*  CO2 19* 20* 18* 19*  GLUCOSE 163* 161* 162* 141*  BUN 48* 39* 35* 31*  CREATININE 2.21* 1.92* 1.95* 1.78*  CALCIUM 8.2* 8.1* 8.3* 8.5*  MG 2.2 2.0  --   --     Liver Function Tests: Recent Labs  Lab 06/08/22 0804 06/09/22 0455 06/10/22 0645  AST 36 35 38  ALT 33 33 36  ALKPHOS 66 67 68  BILITOT 1.2 0.8 0.8  PROT 6.7 6.6 6.8  ALBUMIN 2.6* 2.6* 2.7*    No results for input(s): "LIPASE", "AMYLASE" in the last 168 hours. No results for input(s): "AMMONIA" in the last 168 hours. CBC: Recent Labs  Lab 06/10/22 0645 06/11/22 0254  WBC 6.4 6.7  HGB 10.1* 10.2*  HCT 32.8* 31.7*  MCV 100.0 97.2  PLT 245 247    Cardiac Enzymes: No results for input(s): "CKTOTAL", "CKMB", "CKMBINDEX", "TROPONINI" in the last 168 hours. BNP: Invalid input(s): "POCBNP" CBG: Recent Labs  Lab 06/13/22 1243 06/13/22 1806 06/13/22 2116 06/13/22 2207 06/14/22 0749  GLUCAP 186* 145* 157* 135* 124*    D-Dimer No results for input(s): "DDIMER" in the last 72 hours. Hgb A1c No results for input(s): "HGBA1C" in the last 72 hours. Lipid Profile No results for input(s): "CHOL", "HDL", "LDLCALC", "TRIG", "CHOLHDL", "LDLDIRECT" in the last 72 hours. Thyroid function  studies No results  for input(s): "TSH", "T4TOTAL", "T3FREE", "THYROIDAB" in the last 72 hours.  Invalid input(s): "FREET3" Anemia work up No results for input(s): "VITAMINB12", "FOLATE", "FERRITIN", "TIBC", "IRON", "RETICCTPCT" in the last 72 hours. Urinalysis    Component Value Date/Time   COLORURINE YELLOW 11/08/2021 2120   APPEARANCEUR CLEAR 11/08/2021 2120   LABSPEC 1.011 11/08/2021 2120   PHURINE 6.0 11/08/2021 2120   GLUCOSEU NEGATIVE 11/08/2021 2120   HGBUR SMALL (A) 11/08/2021 2120   [place] NEGATIVE 11/08/2021 2120   [place] NEGATIVE 11/08/2021 2120   PROTEINUR NEGATIVE 11/08/2021 2120   NITRITE NEGATIVE 11/08/2021 2120   LEUKOCYTESUR TRACE (A) 11/08/2021 2120   Sepsis Labs Recent Labs  Lab 06/10/22 0645 06/11/22 0254  WBC 6.4 6.7    Microbiology No results found for this or any previous visit (from the past 240 hour(s)).   Time coordinating discharge: Over 30 minutes  SIGNED:   Phillips Climes, MD  [facility] 06/14/2022, 9:40 AM Pager   If 7PM-7AM, please contact night-coverage [email or web address]

## 2022-06-14 NOTE — Progress Notes (Signed)
Occupational Therapy Treatment Patient Details Name: Frank Shelton. 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  Pt was intubated from 12/30-1/13.  CXR reported small right pleural effusion with probable adjacent right basilar Atelectasis. Significant PMH: PE/DVT, DM, HTN, gout, COPD, OSA.   OT comments  Patient seen for dressing to prepare for discharge home. Patient required min assist and increased time to get to EOB due to dizziness. BP checked on EOB with 98/70 and felt better with time.  Dressing performed seated on EOB with patient declining to stand to pull up clothing or get closer to [facility] due to dizziness. Patient is expected to discharge to day with family to assist. Patient would benefit from further inpatient rehab to increase independence with transfers but refuses SNF and insurance denied AIR.    Recommendations for follow up therapy are one component of a multi-disciplinary discharge planning process, led by the attending physician.  Recommendations may be updated based on patient status, additional functional criteria and insurance authorization.    Follow Up Recommendations  Acute inpatient rehab (3hours/day)     Assistance Recommended at Discharge Frequent or constant Supervision/Assistance  Patient can return home with the following  A lot of help with bathing/dressing/bathroom;Direct supervision/assist for medications management;Direct supervision/assist for financial management;Assist for transportation;Help with stairs or ramp for entrance;Two people to help with walking and/or transfers   Equipment Recommendations  None recommended by OT    Recommendations for Other Services       Precautions / Restrictions Precautions Precautions: Fall Restrictions Weight Bearing Restrictions: No       Mobility Bed Mobility Overal bed mobility: Needs Assistance Bed Mobility: Supine to Sit, Sit to Supine     Supine to sit: HOB elevated, Min assist Sit to supine: Min assist   General bed mobility comments: increased time due to patient needing to stop midway due to dizziness    Transfers Overall transfer level: Needs assistance                 General transfer comment: declined standing     Balance Overall balance assessment: Needs assistance Sitting-balance support: Feet supported, No upper extremity supported Sitting balance-Leahy Scale: Good Sitting balance - Comments: able to perform dressing tasks seated on EOB                                   ADL either performed or assessed with clinical judgement   ADL Overall ADL's : Needs assistance/impaired                 Upper Body Dressing : Minimal assistance;Sitting Upper Body Dressing Details (indicate cue type and reason): to donn pullover top Lower Body Dressing: Maximal assistance;Sitting/lateral leans Lower Body Dressing Details (indicate cue type and reason): assistance threading legs into clothing and pulling over hips with lateral leaning               General ADL Comments: patient declined attempting to stand to pull up clothing    Extremity/Trunk Assessment              Vision  Perception     Praxis      Cognition Arousal/Alertness: Awake/alert Behavior During Therapy: WFL for tasks assessed/performed Overall Cognitive Status: Impaired/Different from baseline Area of Impairment: Awareness, Safety/judgement, Problem solving, Memory                     Memory: Decreased short-term memory   Safety/Judgement: Decreased awareness of deficits Awareness: Emergent Problem Solving: Slow processing, Difficulty sequencing General Comments:  following commands, complaints of dizziness with movement        Exercises      Shoulder Instructions       General Comments BP in supine 109/66 seated on EOB 98/70    Pertinent Vitals/ Pain       Pain Assessment Pain Assessment: Faces Faces Pain Scale: Hurts little more Pain Location: LLE with movement Pain Descriptors / Indicators: Discomfort, Guarding, Grimacing Pain Intervention(s): Limited activity within patient's tolerance, Monitored during session, Repositioned  Home Living                                          Prior Functioning/Environment              Frequency  Min 2X/week        Progress Toward Goals  OT Goals(current goals can now be found in the care plan section)  Progress towards OT goals: Progressing toward goals  Acute Rehab OT Goals Patient Stated Goal: go home OT Goal Formulation: With patient Time For Goal Achievement: 06/20/22 Potential to Achieve Goals: Fair ADL Goals Pt Will Perform Grooming: with set-up;sitting Pt Will Perform Upper Body Bathing: with set-up;sitting Pt Will Perform Lower Body Bathing: with mod assist;sitting/lateral leans Pt Will Perform Upper Body Dressing: with supervision;sitting Pt Will Perform Lower Body Dressing: with mod assist;sitting/lateral leans Pt Will Transfer to Toilet: with mod assist;with +2 assist;stand pivot transfer;bedside commode Pt/caregiver will Perform Home Exercise Program: Increased ROM;Increased strength;Left upper extremity;With minimal assist;With written HEP provided Additional ADL Goal #1: Complete bed mobility with Min Guard and use of side rail to increase ADL ind Additional ADL Goal #2: Pt will tolerate sitting unsupported EOB for 5 minutes with min G as a precursoe to ADLs  Plan Discharge plan remains appropriate    Co-evaluation                 AM-PAC OT "6 Clicks" Daily Activity     Outcome Measure   Help from another person eating meals?:  None Help from another person taking care of personal grooming?: None Help from another person toileting, which includes using toliet, bedpan, or urinal?: A Lot Help from another person bathing (including washing, rinsing, drying)?: A Lot Help from another person to put on and taking off regular upper body clothing?: A Little Help from another person to put on and taking off regular lower body clothing?: A Lot 6 Click Score: 17    End of Session    OT Visit Diagnosis: Unsteadiness on feet (R26.81);Muscle weakness (generalized) (M62.81)   Activity Tolerance Patient tolerated treatment well   Patient Left in bed;with call bell/phone within reach;with bed alarm set   Nurse Communication Mobility status        Time: 1005-1036 OT Time Calculation (min): 31 min  Charges: OT General Charges $OT Visit: 1 Visit OT Treatments $Self Care/Home Management : 23-37 mins  Lodema Hong, [place]  Office [phone]  Frank Shelton 06/14/2022, 1:44 PM

## 2022-06-14 NOTE — Progress Notes (Signed)
Inpatient Rehabilitation Admissions Coordinator   I have received a denial from [facility] for CIR and I spoke to pt's Mom by phone and she was made aware yesterday. We will sign off.  Danne Baxter, RN, MSN Rehab Admissions Coordinator [phone] 06/14/2022 7:58 AM

## 2022-10-16 ENCOUNTER — Other Ambulatory Visit: Payer: Self-pay

## 2022-10-16 ENCOUNTER — Observation Stay (HOSPITAL_COMMUNITY)
Admission: EM | Admit: 2022-10-16 | Discharge: 2022-10-21 | Disposition: A | Discharge status: Home / Self Care | Payer: Medicare PPO | Attending: Internal Medicine | Admitting: Internal Medicine

## 2022-10-16 ENCOUNTER — Emergency Department (HOSPITAL_COMMUNITY): Payer: Medicare PPO

## 2022-10-16 ENCOUNTER — Encounter (HOSPITAL_COMMUNITY): Payer: Self-pay | Admitting: Emergency Medicine

## 2022-10-16 DIAGNOSIS — E1122 Type 2 diabetes mellitus with diabetic chronic kidney disease: Secondary | ICD-10-CM | POA: Insufficient documentation

## 2022-10-16 DIAGNOSIS — I5032 Chronic diastolic (congestive) heart failure: Secondary | ICD-10-CM | POA: Diagnosis not present

## 2022-10-16 DIAGNOSIS — Z7901 Long term (current) use of anticoagulants: Secondary | ICD-10-CM | POA: Insufficient documentation

## 2022-10-16 DIAGNOSIS — Z8673 Personal history of transient ischemic attack (TIA), and cerebral infarction without residual deficits: Secondary | ICD-10-CM | POA: Insufficient documentation

## 2022-10-16 DIAGNOSIS — R0609 Other forms of dyspnea: Secondary | ICD-10-CM

## 2022-10-16 DIAGNOSIS — Z86711 Personal history of pulmonary embolism: Secondary | ICD-10-CM | POA: Diagnosis not present

## 2022-10-16 DIAGNOSIS — E039 Hypothyroidism, unspecified: Secondary | ICD-10-CM | POA: Diagnosis not present

## 2022-10-16 DIAGNOSIS — K8010 Calculus of gallbladder with chronic cholecystitis without obstruction: Secondary | ICD-10-CM | POA: Diagnosis not present

## 2022-10-16 DIAGNOSIS — Z794 Long term (current) use of insulin: Secondary | ICD-10-CM | POA: Insufficient documentation

## 2022-10-16 DIAGNOSIS — E119 Type 2 diabetes mellitus without complications: Secondary | ICD-10-CM

## 2022-10-16 DIAGNOSIS — Z86718 Personal history of other venous thrombosis and embolism: Secondary | ICD-10-CM | POA: Diagnosis not present

## 2022-10-16 DIAGNOSIS — I10 Essential (primary) hypertension: Secondary | ICD-10-CM | POA: Diagnosis present

## 2022-10-16 DIAGNOSIS — I130 Hypertensive heart and chronic kidney disease with heart failure and stage 1 through stage 4 chronic kidney disease, or unspecified chronic kidney disease: Secondary | ICD-10-CM | POA: Insufficient documentation

## 2022-10-16 DIAGNOSIS — I2510 Atherosclerotic heart disease of native coronary artery without angina pectoris: Secondary | ICD-10-CM | POA: Diagnosis not present

## 2022-10-16 DIAGNOSIS — R0781 Pleurodynia: Secondary | ICD-10-CM

## 2022-10-16 DIAGNOSIS — D72829 Elevated white blood cell count, unspecified: Secondary | ICD-10-CM | POA: Insufficient documentation

## 2022-10-16 DIAGNOSIS — Z79899 Other long term (current) drug therapy: Secondary | ICD-10-CM | POA: Insufficient documentation

## 2022-10-16 DIAGNOSIS — Z87891 Personal history of nicotine dependence: Secondary | ICD-10-CM | POA: Diagnosis not present

## 2022-10-16 DIAGNOSIS — N1832 Chronic kidney disease, stage 3b: Secondary | ICD-10-CM | POA: Insufficient documentation

## 2022-10-16 DIAGNOSIS — R079 Chest pain, unspecified: Secondary | ICD-10-CM | POA: Diagnosis present

## 2022-10-16 DIAGNOSIS — R0789 Other chest pain: Secondary | ICD-10-CM | POA: Diagnosis present

## 2022-10-16 DIAGNOSIS — I2782 Chronic pulmonary embolism: Secondary | ICD-10-CM | POA: Diagnosis present

## 2022-10-16 LAB — CBC
HCT: 47.1 % (ref 39.0–52.0)
Hemoglobin: 16 g/dL (ref 13.0–17.0)
MCH: 30.9 pg (ref 26.0–34.0)
MCHC: 34 g/dL (ref 30.0–36.0)
MCV: 90.9 fL (ref 80.0–100.0)
Platelets: 229 10*3/uL (ref 150–400)
RBC: 5.18 MIL/uL (ref 4.22–5.81)
RDW: 13.8 % (ref 11.5–15.5)
WBC: 9.5 10*3/uL (ref 4.0–10.5)
nRBC: 0 % (ref 0.0–0.2)

## 2022-10-16 LAB — BASIC METABOLIC PANEL
Anion gap: 12 (ref 5–15)
BUN: 33 mg/dL — ABNORMAL HIGH (ref 8–23)
CO2: 23 mmol/L (ref 22–32)
Calcium: 9.7 mg/dL (ref 8.9–10.3)
Chloride: 104 mmol/L (ref 98–111)
Creatinine, Ser: 1.46 mg/dL — ABNORMAL HIGH (ref 0.61–1.24)
GFR, Estimated: 54 mL/min — ABNORMAL LOW (ref >60)
Glucose, Bld: 220 mg/dL — ABNORMAL HIGH (ref 70–99)
Potassium: 3.8 mmol/L (ref 3.5–5.1)
Sodium: 139 mmol/L (ref 135–145)

## 2022-10-16 LAB — PROTIME-INR
INR: 1.3 — ABNORMAL HIGH (ref 0.8–1.2)
Prothrombin Time: 16 seconds — ABNORMAL HIGH (ref 11.4–15.2)

## 2022-10-16 LAB — TROPONIN I (HIGH SENSITIVITY)
Troponin I (High Sensitivity): 6 ng/L (ref <18)
Troponin I (High Sensitivity): 5 ng/L (ref <18)

## 2022-10-16 MED ORDER — IOHEXOL 350 MG/ML SOLN
75.0000 mL | Freq: Once | INTRAVENOUS | Status: AC | PRN
  Administered 2022-10-16: 75 mL via INTRAVENOUS

## 2022-10-16 MED ORDER — NITROGLYCERIN 2 % TD OINT
1.0000 [in_us] | TOPICAL_OINTMENT | Freq: Once | TRANSDERMAL | Status: AC
  Administered 2022-10-16: 1 [in_us] via TOPICAL
  Filled 2022-10-16: qty 1

## 2022-10-16 MED ORDER — ASPIRIN 81 MG PO CHEW
324.0000 mg | CHEWABLE_TABLET | Freq: Once | ORAL | Status: AC
  Administered 2022-10-16: 324 mg via ORAL
  Filled 2022-10-16: qty 4

## 2022-10-16 NOTE — ED Provider Notes (Signed)
[facility] AT [facility] Provider Note   CSN: [id number] Arrival date & time: 10/16/22  1702     History  Chief Complaint  Patient presents with   Chest Pain   Shortness of Breath    Frank Shelton. is a 62 y.o. male.  HPI    62 year old patient comes in with chief complaint of chest pain. Patient has history of CAD status post MI, diabetes, hypertension, OSA, PVD, DVT-PE with IVC filter, currently on Xarelto and cardiac arrest last Christmas because of PE.  Patient comes into the emergency room with chief complaint of chest pain.  Patient had right-sided chest pain 2 days ago.  The pain was mild.  Yesterday he was pain-free however this morning, the pain woke him up around 10 AM.  Since then he has been having constant chest pain that is right-sided, it is worse with deep inspiration.  Intermittently, the pain will get severe.  Pain is described as tightness and pressure.  He also indicates that when he tried to walk, he got short of breath with only couple of steps.  He also felt dizzy when he tried to walk.  His symptoms are similar to his PE. Home Medications Prior to Admission medications   Medication Sig Start Date End Date Taking? Authorizing Provider  acetaminophen (TYLENOL) 325 MG tablet Take 650 mg by mouth every 6 (six) hours as needed for moderate pain or headache.    [provider]  albuterol (VENTOLIN HFA) 108 (90 Base) MCG/ACT inhaler Inhale 2 puffs into the lungs every 4 (four) hours as needed for wheezing or shortness of breath. 05/18/20   Sherryll Burger, Pratik D, DO  apixaban (ELIQUIS) 5 MG TABS tablet Take 1 tablet (5 mg total) by mouth 2 (two) times daily. 06/14/22   Elgergawy, Leana Roe, MD  atorvastatin (LIPITOR) 40 MG tablet Take 40 mg by mouth daily.    [provider]  budesonide-formoterol (SYMBICORT) 160-4.5 MCG/ACT inhaler Inhale 2 puffs into the lungs 2 (two) times daily as needed (for shortness of breath).      [provider]  calcium-vitamin D (OSCAL 500/200 D-3) 500-200 MG-UNIT tablet Take 1 tablet by mouth 2 (two) times daily. Patient not taking: Reported on 05/12/2022 02/02/17   [name], MD  Cholecalciferol (VITAMIN D3) 5000 units CAPS Take 5,000 Units by mouth 2 (two) times daily.    [provider]  colchicine 0.6 MG tablet Take 0.6 mg by mouth daily as needed (for gout flare/pain).     [provider]  Cyanocobalamin (B-12) 5000 MCG CAPS Take 5,000 mcg by mouth daily.     [provider]  furosemide (LASIX) 20 MG tablet Take 20 mg by mouth daily as needed.     [provider]  Insulin Pen Needle 32G X 4 MM MISC Use to inject insulin. 06/14/22   Elgergawy, Leana Roe, MD  levothyroxine (SYNTHROID) 125 MCG tablet Take 2 tablets (250 mcg total) by mouth daily at 6 (six) AM. 06/15/22   Elgergawy, Leana Roe, MD  methocarbamol (ROBAXIN) 500 MG tablet Take 1 tablet (500 mg total) by mouth 2 (two) times daily as needed for muscle spasms. Patient not taking: Reported on 05/12/2022 11/08/21   Eber Hong, MD  metoprolol tartrate (LOPRESSOR) 25 MG tablet Take 1 tablet (25 mg total) by mouth 2 (two) times daily. 06/14/22   Elgergawy, Leana Roe, MD  Multiple Vitamin (MULTIVITAMIN WITH MINERALS) TABS tablet Take 1 tablet by mouth daily.  [provider]  sodium bicarbonate 650 MG tablet Take 1 tablet (650 mg total) by mouth daily. 06/14/22   Elgergawy, Leana Roe, MD  vitamin C (ASCORBIC ACID) 500 MG tablet Take 500 mg by mouth daily.    [provider]      Allergies    Nabumetone    Review of Systems   Review of Systems  All other systems reviewed and are negative.   Physical Exam Updated Vital Signs BP 132/69   Pulse 72   Temp 98.4 F (36.9 C) (Oral)   Resp 15   Ht 6\' 4"  (1.93 m)   Wt (!) 149.7 kg   SpO2 100%   BMI 40.17 kg/m  Physical Exam Vitals and nursing note reviewed.  Constitutional:      Appearance: He is  well-developed.  HENT:     Head: Atraumatic.  Neck:     Vascular: No JVD.  Cardiovascular:     Rate and Rhythm: Normal rate.  Pulmonary:     Effort: Pulmonary effort is normal.     Breath sounds: Normal breath sounds. No decreased breath sounds.  Musculoskeletal:     Cervical back: Neck supple.     Comments: Hyperpigmented bilateral lower extremity  Skin:    General: Skin is warm.  Neurological:     Mental Status: He is alert and oriented to person, place, and time.     ED Results / Procedures / Treatments   Labs (all labs ordered are listed, but only abnormal results are displayed) Labs Reviewed  BASIC METABOLIC PANEL - Abnormal; Notable for the following components:      Result Value   Glucose, Bld 220 (*)    BUN 33 (*)    Creatinine, Ser 1.46 (*)    GFR, Estimated 54 (*)    All other components within normal limits  PROTIME-INR - Abnormal; Notable for the following components:   Prothrombin Time 16.0 (*)    INR 1.3 (*)    All other components within normal limits  CBC  TROPONIN I (HIGH SENSITIVITY)  TROPONIN I (HIGH SENSITIVITY)    EKG EKG Interpretation  Date/Time:  Sunday October 16 2022 17:12:34 EDT Ventricular Rate:  78 PR Interval:  175 QRS Duration: 119 QT Interval:  425 QTC Calculation: 485 R Axis:   4 Text Interpretation: Sinus rhythm Incomplete left bundle branch block Nonspecific ST and T wave abnormality No significant change since last tracing Confirmed by Derwood Kaplan ([id number]) on 10/16/2022 5:38:12 PM  Radiology DG Chest Port 1 View  Result Date: 10/16/2022 CLINICAL DATA:  Left-sided chest pain, shortness of breath, and dizziness today. EXAM: PORTABLE CHEST 1 VIEW COMPARISON:  06/04/2022 FINDINGS: Shallow inspiration. Mild cardiac enlargement. No vascular congestion, edema, or consolidation. Linear atelectasis suggested in the left lung base. No pleural effusions. No pneumothorax. Mediastinal contours appear intact. Surgical clips in the base of the  neck. IMPRESSION: Cardiac enlargement. Shallow inspiration with linear atelectasis in the left base. Electronically Signed   By: Burman Nieves M.D.   On: 10/16/2022 18:29    Procedures Procedures    Medications Ordered in ED Medications - No data to display  ED Course/ Medical Decision Making/ A&P                             Medical Decision Making Amount and/or Complexity of Data Reviewed Labs: ordered. Radiology: ordered.   62 year old male with history of CAD, PVD, PE/DVT on  Xarelto status post IVC filter and cardiac arrest secondary to PE in December comes in with chief complaint of left-sided chest pain that is pleuritic in nature, with associated exertional shortness of breath and dizziness.  Differential diagnosis considered for this patient includes acute coronary syndrome, angina pectoris, pulmonary embolism, pleurisy, pneumonia, esophageal spasms.  Plan is to get basic labs, CT angio chest PE, chest x-ray and delta troponin.  I have reviewed patient's previous discharge summary, echocardiogram, nuclear medicine scans and CT PE.  Patient's ex-wife is at the bedside, she also provides collateral history.  Reassessment and plan: Patient's initial troponins are reassuring. Plan is for patient to get transferred to [facility].  At [facility] he will get a CT PE.  He will stay at [facility] for the results to be read.  If his CT PE is positive, then you will need admission for pulmonary embolism.  If his CT PE is negative, then he will need discussion with cardiology and admission to the hospital for monitoring, Cardiology assessment and possible provocative testing if they feel that his best next step for his atypical chest pain and exertional dyspnea.  Patient can be discharged if cardiology does not think patient needs to stay in the hospital.  Patient does not have stent in his heart, but he has been told he has CAD.  Additionally he has a nuclear scan in the past that have  shown evidence of previous MI..  Final Clinical Impression(s) / ED Diagnoses Final diagnoses:  Pleuritic chest pain  Exertional dyspnea    Rx / DC Orders ED Discharge Orders     None         Derwood Kaplan, MD 10/16/22 2050

## 2022-10-16 NOTE — ED Triage Notes (Signed)
Pt via POV c/o nonradiating left-sided chest pain with associated SOB and dizziness since 10am today. Denies n/v/diaphoresis. Significant cardiac history including multiple heart attacks and prior blood clots, taking Eliquis. Pain rated 7/10 and shooting

## 2022-10-16 NOTE — ED Provider Notes (Signed)
11:17 PM Patient care assumed in transfer. Sent for CTA chest to r/o PE. In short, patient with extensive PMH including cardiac arrest in December 2/2 pulmonary embolus. He has been compliant with his Xarelto. Patient has been experiencing LEFT sided chest pain x 2 days. It is mild, intermittent and pleuritic; however, also exertional. He has associated DOE. Prior nuclear study in 2022 indicative of prior myocardial infarction; however, patient without hx of PCI. He has never had a cardiac catheterization.  I have viewed and interpreted the patient's CTA which shows no evidence of acute pulmonary embolus today.  Prior PE largely resolved.  There is mild cardiomegaly, small hiatal hernia.  11:39 PM I have an extensive conversation with the patient regarding his symptoms as well as his workup, thus far.  He is relieved that his CT does not show any new pulmonary embolus, but he still feels unsafe going home with his persistent chest discomfort.  He feels winded even with prolonged conversation, though he does not appear dyspneic and is not tachypneic during my bedside discussions with him.  He feels as though his symptoms are similar to those he had prior to his cardiac arrest.  I do not feel it is unreasonable for him to be admitted for ongoing evaluation and chest pain rule out.  Will consult with cardiology.  Appreciate their input.  Ultimately, I anticipate this patient will be admitted to the medical service with ongoing cardiology consultation.  12:17 AM Spoke with Dr. Mackie Pai of Cardiology who feels patient is appropriate for admission to the medicine service. Someone from their service will plan to assess the patient in the morning. Consult placed for unassigned medical admission.  12:57 AM Case discussed with Dr. Arlean Hopping of [facility] who will assess for admission.    Results for orders placed or performed during the hospital encounter of 10/16/22  Basic metabolic panel  Result Value Ref Range    Sodium 139 135 - 145 mmol/L   Potassium 3.8 3.5 - 5.1 mmol/L   Chloride 104 98 - 111 mmol/L   CO2 23 22 - 32 mmol/L   Glucose, Bld 220 (H) 70 - 99 mg/dL   BUN 33 (H) 8 - 23 mg/dL   Creatinine, Ser 4.09 (H) 0.61 - 1.24 mg/dL   Calcium 9.7 8.9 - 81.1 mg/dL   GFR, Estimated 54 (L) >60 mL/min   Anion gap 12 5 - 15  CBC  Result Value Ref Range   WBC 9.5 4.0 - 10.5 K/uL   RBC 5.18 4.22 - 5.81 MIL/uL   Hemoglobin 16.0 13.0 - 17.0 g/dL   HCT 91.4 78.2 - 95.6 %   MCV 90.9 80.0 - 100.0 fL   MCH 30.9 26.0 - 34.0 pg   MCHC 34.0 30.0 - 36.0 g/dL   RDW 21.3 08.6 - 57.8 %   Platelets 229 150 - 400 K/uL   nRBC 0.0 0.0 - 0.2 %  Protime-INR (order if Patient is taking Coumadin / Warfarin)  Result Value Ref Range   Prothrombin Time 16.0 (H) 11.4 - 15.2 seconds   INR 1.3 (H) 0.8 - 1.2  Troponin I (High Sensitivity)  Result Value Ref Range   Troponin I (High Sensitivity) 6 <18 ng/L  Troponin I (High Sensitivity)  Result Value Ref Range   Troponin I (High Sensitivity) 5 <18 ng/L   CT Angio Chest PE W and/or Wo Contrast  Result Date: 10/16/2022 CLINICAL DATA:  Pulmonary embolism (PE) suspected, high prob Chest pain and shortness of breath.  Radiologic records indicates history of pulmonary embolus. EXAM: CT ANGIOGRAPHY CHEST WITH CONTRAST TECHNIQUE: Multidetector CT imaging of the chest was performed using the standard protocol during bolus administration of intravenous contrast. Multiplanar CT image reconstructions and MIPs were obtained to evaluate the vascular anatomy. RADIATION DOSE REDUCTION: This exam was performed according to the departmental dose-optimization program which includes automated exposure control, adjustment of the mA and/or kV according to patient size and/or use of iterative reconstruction technique. CONTRAST:  75mL OMNIPAQUE IOHEXOL 350 MG/ML SOLN COMPARISON:  Radiograph earlier today. Chest CTA 05/12/2022 FINDINGS: Cardiovascular: The previous right-sided pulmonary emboli have  resolved. No right-sided chronic thrombus. No acute pulmonary arterial filling defects or acute pulmonary emboli. Small focus of chronic thrombus within the left lower lobe segmental bifurcation is unchanged from prior exam, series 5, image 73. mild aortic atherosclerosis. No aortic aneurysm. Mild cardiomegaly. No pericardial effusion. Mediastinum/Nodes: No enlarged mediastinal or hilar lymph nodes. Scattered small mediastinal nodes are not enlarged by size criteria. Thyroidectomy. Small hiatal hernia. Lungs/Pleura: Motion artifact in the lower lobes. Mild heterogeneous pulmonary parenchyma. No focal or confluent consolidation. No pleural fluid. No suspicious pulmonary nodule. The trachea and central airways are clear. Upper Abdomen: Layering sludge or gallstones in the gallbladder without inflammation. No acute upper abdominal finding. Musculoskeletal: There are no acute or suspicious osseous abnormalities. Bilateral gynecomastia. Review of the MIP images confirms the above findings. IMPRESSION: 1. No acute pulmonary emboli. The previous right-sided pulmonary emboli have resolved. Small focus of chronic thrombus within the left lower lobe segmental bifurcation is unchanged from prior exam. 2. Mild heterogeneous pulmonary parenchyma, can be seen with small airways disease. 3. Mild cardiomegaly. 4. Small hiatal hernia. 5. Layering sludge or gallstones in the gallbladder without inflammation. Aortic Atherosclerosis (ICD10-I70.0). Electronically Signed   By: Narda Rutherford M.D.   On: 10/16/2022 23:02   DG Chest Port 1 View  Result Date: 10/16/2022 CLINICAL DATA:  Left-sided chest pain, shortness of breath, and dizziness today. EXAM: PORTABLE CHEST 1 VIEW COMPARISON:  06/04/2022 FINDINGS: Shallow inspiration. Mild cardiac enlargement. No vascular congestion, edema, or consolidation. Linear atelectasis suggested in the left lung base. No pleural effusions. No pneumothorax. Mediastinal contours appear intact.  Surgical clips in the base of the neck. IMPRESSION: Cardiac enlargement. Shallow inspiration with linear atelectasis in the left base. Electronically Signed   By: Burman Nieves M.D.   On: 10/16/2022 18:29      Antony Madura, PA-C 10/17/22 1610    Shon Baton, MD 10/17/22 [phone]

## 2022-10-16 NOTE — ED Triage Notes (Signed)
Pt BIB [facility], ED to ED transfer from [facility]. Pt c/o chest pain and transferred for CTA.

## 2022-10-16 NOTE — ED Notes (Signed)
Pt arrived to [facility] ED.  

## 2022-10-17 ENCOUNTER — Encounter (HOSPITAL_COMMUNITY): Payer: Self-pay | Admitting: Internal Medicine

## 2022-10-17 ENCOUNTER — Observation Stay (HOSPITAL_COMMUNITY): Payer: Medicare PPO

## 2022-10-17 ENCOUNTER — Observation Stay (HOSPITAL_BASED_OUTPATIENT_CLINIC_OR_DEPARTMENT_OTHER): Payer: Medicare PPO

## 2022-10-17 DIAGNOSIS — I5032 Chronic diastolic (congestive) heart failure: Secondary | ICD-10-CM | POA: Diagnosis not present

## 2022-10-17 DIAGNOSIS — R079 Chest pain, unspecified: Secondary | ICD-10-CM

## 2022-10-17 DIAGNOSIS — I2782 Chronic pulmonary embolism: Secondary | ICD-10-CM | POA: Diagnosis not present

## 2022-10-17 DIAGNOSIS — R0789 Other chest pain: Secondary | ICD-10-CM

## 2022-10-17 DIAGNOSIS — E039 Hypothyroidism, unspecified: Secondary | ICD-10-CM

## 2022-10-17 DIAGNOSIS — I10 Essential (primary) hypertension: Secondary | ICD-10-CM

## 2022-10-17 DIAGNOSIS — E119 Type 2 diabetes mellitus without complications: Secondary | ICD-10-CM

## 2022-10-17 LAB — ECHOCARDIOGRAM COMPLETE
AR max vel: 2.61 cm2
AV Area VTI: 3.13 cm2
AV Area mean vel: 2.79 cm2
AV Mean grad: 3 mmHg
AV Peak grad: 5.7 mmHg
Ao pk vel: 1.19 m/s
Area-P 1/2: 2.4 cm2
Height: 76 in
S' Lateral: 2.7 cm
Weight: 5231.07 oz

## 2022-10-17 LAB — COMPREHENSIVE METABOLIC PANEL
ALT: 20 U/L (ref 0–44)
AST: 22 U/L (ref 15–41)
Albumin: 3.7 g/dL (ref 3.5–5.0)
Alkaline Phosphatase: 59 U/L (ref 38–126)
Anion gap: 12 (ref 5–15)
BUN: 31 mg/dL — ABNORMAL HIGH (ref 8–23)
CO2: 21 mmol/L — ABNORMAL LOW (ref 22–32)
Calcium: 9.4 mg/dL (ref 8.9–10.3)
Chloride: 105 mmol/L (ref 98–111)
Creatinine, Ser: 1.53 mg/dL — ABNORMAL HIGH (ref 0.61–1.24)
GFR, Estimated: 51 mL/min — ABNORMAL LOW (ref >60)
Glucose, Bld: 119 mg/dL — ABNORMAL HIGH (ref 70–99)
Potassium: 3.8 mmol/L (ref 3.5–5.1)
Sodium: 138 mmol/L (ref 135–145)
Total Bilirubin: 0.9 mg/dL (ref 0.3–1.2)
Total Protein: 7.3 g/dL (ref 6.5–8.1)

## 2022-10-17 LAB — CBC WITH DIFFERENTIAL/PLATELET
Abs Immature Granulocytes: 0.03 10*3/uL (ref 0.00–0.07)
Basophils Absolute: 0.1 10*3/uL (ref 0.0–0.1)
Basophils Relative: 1 %
Eosinophils Absolute: 0.2 10*3/uL (ref 0.0–0.5)
Eosinophils Relative: 2 %
HCT: 43.1 % (ref 39.0–52.0)
Hemoglobin: 14.6 g/dL (ref 13.0–17.0)
Immature Granulocytes: 0 %
Lymphocytes Relative: 38 %
Lymphs Abs: 4 10*3/uL (ref 0.7–4.0)
MCH: 30.7 pg (ref 26.0–34.0)
MCHC: 33.9 g/dL (ref 30.0–36.0)
MCV: 90.5 fL (ref 80.0–100.0)
Monocytes Absolute: 0.8 10*3/uL (ref 0.1–1.0)
Monocytes Relative: 7 %
Neutro Abs: 5.4 10*3/uL (ref 1.7–7.7)
Neutrophils Relative %: 52 %
Platelets: 236 10*3/uL (ref 150–400)
RBC: 4.76 MIL/uL (ref 4.22–5.81)
RDW: 14 % (ref 11.5–15.5)
WBC: 10.5 10*3/uL (ref 4.0–10.5)
nRBC: 0 % (ref 0.0–0.2)

## 2022-10-17 LAB — TROPONIN I (HIGH SENSITIVITY): Troponin I (High Sensitivity): 6 ng/L (ref <18)

## 2022-10-17 LAB — MAGNESIUM: Magnesium: 2.2 mg/dL (ref 1.7–2.4)

## 2022-10-17 LAB — GLUCOSE, CAPILLARY
Glucose-Capillary: 134 mg/dL — ABNORMAL HIGH (ref 70–99)
Glucose-Capillary: 173 mg/dL — ABNORMAL HIGH (ref 70–99)

## 2022-10-17 LAB — LIPASE, BLOOD: Lipase: 56 U/L — ABNORMAL HIGH (ref 11–51)

## 2022-10-17 LAB — CBG MONITORING, ED: Glucose-Capillary: 153 mg/dL — ABNORMAL HIGH (ref 70–99)

## 2022-10-17 MED ORDER — INSULIN ASPART 100 UNIT/ML IJ SOLN
0.0000 [IU] | Freq: Four times a day (QID) | INTRAMUSCULAR | Status: DC
  Administered 2022-10-17 (×2): 2 [IU] via SUBCUTANEOUS
  Administered 2022-10-17 – 2022-10-19 (×5): 1 [IU] via SUBCUTANEOUS
  Administered 2022-10-19: 2 [IU] via SUBCUTANEOUS
  Administered 2022-10-19: 1 [IU] via SUBCUTANEOUS
  Administered 2022-10-20: 5 [IU] via SUBCUTANEOUS
  Administered 2022-10-20: 1 [IU] via SUBCUTANEOUS
  Administered 2022-10-21 (×2): 3 [IU] via SUBCUTANEOUS

## 2022-10-17 MED ORDER — ACETAMINOPHEN 650 MG RE SUPP: 650.0000 mg | Freq: Four times a day (QID) | RECTAL | Status: DC | PRN

## 2022-10-17 MED ORDER — POTASSIUM CHLORIDE CRYS ER 20 MEQ PO TBCR
40.0000 meq | EXTENDED_RELEASE_TABLET | Freq: Once | ORAL | Status: AC
  Administered 2022-10-17: 40 meq via ORAL
  Filled 2022-10-17: qty 2

## 2022-10-17 MED ORDER — ATORVASTATIN CALCIUM 40 MG PO TABS
40.0000 mg | ORAL_TABLET | Freq: Every day | ORAL | Status: DC
  Administered 2022-10-17 – 2022-10-21 (×5): 40 mg via ORAL
  Filled 2022-10-17 (×5): qty 1

## 2022-10-17 MED ORDER — ONDANSETRON HCL 4 MG/2ML IJ SOLN: 4.0000 mg | Freq: Four times a day (QID) | INTRAMUSCULAR | Status: DC | PRN

## 2022-10-17 MED ORDER — MELATONIN 3 MG PO TABS
3.0000 mg | ORAL_TABLET | Freq: Every evening | ORAL | Status: DC | PRN
  Filled 2022-10-17: qty 1

## 2022-10-17 MED ORDER — METOPROLOL TARTRATE 25 MG PO TABS
25.0000 mg | ORAL_TABLET | Freq: Two times a day (BID) | ORAL | Status: DC
  Administered 2022-10-17 – 2022-10-21 (×9): 25 mg via ORAL
  Filled 2022-10-17: qty 2
  Filled 2022-10-17 (×8): qty 1

## 2022-10-17 MED ORDER — LEVOTHYROXINE SODIUM 50 MCG PO TABS
250.0000 ug | ORAL_TABLET | Freq: Every day | ORAL | Status: DC
  Administered 2022-10-17 – 2022-10-21 (×3): 250 ug via ORAL
  Filled 2022-10-17 (×2): qty 1
  Filled 2022-10-17: qty 2

## 2022-10-17 MED ORDER — ACETAMINOPHEN 325 MG PO TABS: 650.0000 mg | ORAL_TABLET | Freq: Four times a day (QID) | ORAL | Status: DC | PRN

## 2022-10-17 MED ORDER — APIXABAN 5 MG PO TABS
5.0000 mg | ORAL_TABLET | Freq: Two times a day (BID) | ORAL | Status: DC
  Administered 2022-10-17 (×2): 5 mg via ORAL
  Filled 2022-10-17 (×2): qty 1

## 2022-10-17 MED ORDER — PNEUMOCOCCAL VAC POLYVALENT 25 MCG/0.5ML IJ INJ
0.5000 mL | INJECTION | INTRAMUSCULAR | Status: DC
  Filled 2022-10-17: qty 0.5
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
## 2022-10-17 NOTE — H&P (Signed)
History and Physical      Frank Shelton. NUU:[id number] DOB: 1960/08/16 DOA: 10/16/2022; DOS: 10/17/2022  PCP: Frank [place], FNP  Patient coming from: home   I have personally briefly reviewed patient's old medical records in [facility]  Chief Complaint: Chest pain  HPI: Frank Shelton. is a 62 y.o. male with medical history significant for chronic pulmonary embolism on chronic Eliquis, cardiac arrest in December 2023, type 2 diabetes mellitus, essential hypertension, acquired hypothyroidism, chronic diastolic heart failure, who is admitted to [facility] on 10/16/2022 with chest pain after presenting from as ED to ED transfer from [facility] to [facility] complaining of chest pain.   The patient reports 2 days of intermittent left-sided nonradiating chest pressure that worsens with ambulation and improves with rest, and resolved following application nitroglycerin paste in the emergency department today.  He conveys that this chest pain is nonpositional and not reproducible with direct palpation over the anterior chest wall, but does note that the chest pain seem to intensify with deep inspiration.  He denies any associated shortness of breath, palpitations, diaphoresis, nausea, vomiting, dizziness, presyncope, or syncope.  No recent trauma.  Denies any associated recent subjective fever, chills, rigors, or generalized myalgias.  No recent cough, hemoptysis .  No wheezing.  He is currently chest pain-free after application nitroglycerin paste.   He has a documented history of chronic left-sided pulmonary embolism for which he is on chronic Eliquis therapy.  He experienced cardiopulmonary arrest in December 2023 in the reported setting of pulmonary embolism.  In terms of prior cardiac evaluation, he reportedly underwent a nuclear stress test in 2022 which was suggestive of prior MI, but demonstrated no evidence of reversible ischemia.  He has never undergone coronary  angiography.  Most recent echocardiogram occurred in January 2024 was notable for LVEF 50 to 55%, no focal wall motion abnormalities, grade 1 diastolic exertion and normal right ventricular systolic motion, and no evidence of significant valvular pathology.     AP and [facility] ED Course:  Vital signs in the ED were notable for the following: Afebrile; heart rates in the 60s to 70s; systolic blood pressures in the 1 teens to 150s; respiratory rate 14-20, oxygen saturation 97 to 100% on room air.  Labs were notable for the following: BMP notable for the following: Sodium 139, potassium 3.8, creatinine 1.46 compared to most recent prior value of 1.78 on 06/11/2022, glucose 220.  High-sensitivity troponin I initially 6, with repeat value trending down to 5.  CBC notable for white cell count 9500, hemoglobin 16.  Per my interpretation, EKG in ED demonstrated the following: Sinus rhythm with heart rate 77, normal intervals, no evidence of T wave or ST changes, including no evidence of ST elevation.  Imaging and additional notable ED work-up: CTA chest, performed interval radiology read in comparison to most recent prior CTA chest performed on 05/04/2022 showed no evidence of acute pulmonary embolism, and no evidence of acute cardiopulmonary process, including no evidence of infiltrate, edema, effusion, or pneumothorax, while noting chronic left lower lobe thrombus that appears unchanged from most recent prior CTA chest.  EDP discussed patient's case with on-call cardiology fellow (Dr. Mackie Pai), who recommended [facility] admission, and conveyed that cardiology will formally consult and see the patient in the morning.   While in the ED, the following were administered: Nitroglycerin paste.  Full dose aspirin x 1.  Subsequently, the patient was admitted for further evaluation management of presenting chest  pain.     Review of Systems: As per HPI otherwise 10 point review of systems negative.   Past Medical  History:  Diagnosis Date   Arthritis    L shoulder & fingers    Bronchitis    during enviromental changes    Complication of anesthesia    Report of airway problem   Diabetes mellitus without complication (HCC)    Dyspnea    Embolism - blood clot    Gout    Headache    uses ibuprofen & tylenol & its relieved    Hypertension    Hypothyroidism    Myocardial infarction (HCC) 2012   OSA on CPAP    Peripheral vascular disease (HCC)    Pulmonary embolism (HCC)    Stroke (HCC) 2014    Past Surgical History:  Procedure Laterality Date   HELLER MYOTOMY  2014   [place]   IVC FILTER INSERTION  2010   ROTATOR CUFF REPAIR Right    SKIN GRAFT     x3- to face., child    THYROIDECTOMY N/A 03/09/2017   Procedure: TOTAL THYROIDECTOMY;  Surgeon: Darnell Level, MD;  Location: [facility] OR;  Service: General;  Laterality: N/A;   TOTAL THYROIDECTOMY  03/09/2017    Social History:  reports that he quit smoking about 16 years ago. His smoking use included cigarettes. He started smoking about 52 years ago. He has a 30.00 pack-year smoking history. He has quit using smokeless tobacco. He reports that he does not currently use alcohol. He reports that he does not use drugs.   Allergies  Allergen Reactions   Nabumetone Hives    Family History  Problem Relation Age of Onset   Heart attack Sister 55       Two stents   Heart attack Mother    Heart attack Maternal Grandmother    Heart attack Maternal Grandfather     Family history reviewed and not pertinent    Prior to Admission medications   Medication Sig Start Date End Date Taking? Authorizing Provider  acetaminophen (TYLENOL) 325 MG tablet Take 650 mg by mouth every 6 (six) hours as needed for moderate pain or headache.    [provider]  albuterol (VENTOLIN HFA) 108 (90 Base) MCG/ACT inhaler Inhale 2 puffs into the lungs every 4 (four) hours as needed for wheezing or shortness of breath. 05/18/20   Sherryll Burger, Pratik D, DO  apixaban  (ELIQUIS) 5 MG TABS tablet Take 1 tablet (5 mg total) by mouth 2 (two) times daily. 06/14/22   Elgergawy, Leana Roe, MD  atorvastatin (LIPITOR) 40 MG tablet Take 40 mg by mouth daily.    [provider]  budesonide-formoterol (SYMBICORT) 160-4.5 MCG/ACT inhaler Inhale 2 puffs into the lungs 2 (two) times daily as needed (for shortness of breath).     [provider]  calcium-vitamin D (OSCAL 500/200 D-3) 500-200 MG-UNIT tablet Take 1 tablet by mouth 2 (two) times daily. Patient not taking: Reported on 05/12/2022 02/02/17   [name], MD  Cholecalciferol (VITAMIN D3) 5000 units CAPS Take 5,000 Units by mouth 2 (two) times daily.    [provider]  colchicine 0.6 MG tablet Take 0.6 mg by mouth daily as needed (for gout flare/pain).     [provider]  Cyanocobalamin (B-12) 5000 MCG CAPS Take 5,000 mcg by mouth daily.     [provider]  furosemide (LASIX) 20 MG tablet Take 20 mg by mouth daily as needed.  [provider]  Insulin Pen Needle 32G X 4 MM MISC Use to inject insulin. 06/14/22   Elgergawy, Leana Roe, MD  levothyroxine (SYNTHROID) 125 MCG tablet Take 2 tablets (250 mcg total) by mouth daily at 6 (six) AM. 06/15/22   Elgergawy, Leana Roe, MD  methocarbamol (ROBAXIN) 500 MG tablet Take 1 tablet (500 mg total) by mouth 2 (two) times daily as needed for muscle spasms. Patient not taking: Reported on 05/12/2022 11/08/21   Eber Hong, MD  metoprolol tartrate (LOPRESSOR) 25 MG tablet Take 1 tablet (25 mg total) by mouth 2 (two) times daily. 06/14/22   Elgergawy, Leana Roe, MD  Multiple Vitamin (MULTIVITAMIN WITH MINERALS) TABS tablet Take 1 tablet by mouth daily.    [provider]  sodium bicarbonate 650 MG tablet Take 1 tablet (650 mg total) by mouth daily. 06/14/22   Elgergawy, Leana Roe, MD  vitamin C (ASCORBIC ACID) 500 MG tablet Take 500 mg by mouth daily.    [provider]     Objective    Physical Exam: Vitals:    10/16/22 2030 10/16/22 2225 10/16/22 2225 10/16/22 2230  BP: 124/71 127/68  116/79  Pulse: 65   78  Resp: 13   16  Temp:   97.6 F (36.4 C)   TempSrc:   Oral   SpO2: 100%   100%  Weight:      Height:        General: appears to be stated age; alert, oriented Skin: warm, dry, no rash Head:  AT/[place] Mouth:  Oral mucosa membranes appear moist, normal dentition Neck: supple; trachea midline Heart:  RRR; did not appreciate any M/R/G Lungs: CTAB, did not appreciate any wheezes, rales, or rhonchi Abdomen: + BS; soft, ND, NT Vascular: 2+ pedal pulses b/l; 2+ radial pulses b/l Extremities: no peripheral edema, no muscle wasting Neuro: strength and sensation intact in upper and lower extremities b/l     Labs on Admission: I have personally reviewed following labs and imaging studies  CBC: Recent Labs  Lab 10/16/22 1725  WBC 9.5  HGB 16.0  HCT 47.1  MCV 90.9  PLT 229   Basic Metabolic Panel: Recent Labs  Lab 10/16/22 1725  NA 139  K 3.8  CL 104  CO2 23  GLUCOSE 220*  BUN 33*  CREATININE 1.46*  CALCIUM 9.7   GFR: Estimated Creatinine Clearance: 82 mL/min (A) (by C-G formula based on SCr of 1.46 mg/dL (H)). Liver Function Tests: No results for input(s): "AST", "ALT", "ALKPHOS", "BILITOT", "PROT", "ALBUMIN" in the last 168 hours. No results for input(s): "LIPASE", "AMYLASE" in the last 168 hours. No results for input(s): "AMMONIA" in the last 168 hours. Coagulation Profile: Recent Labs  Lab 10/16/22 1725  INR 1.3*   Cardiac Enzymes: No results for input(s): "CKTOTAL", "CKMB", "CKMBINDEX", "TROPONINI" in the last 168 hours. BNP (last 3 results) No results for input(s): "PROBNP" in the last 8760 hours. HbA1C: No results for input(s): "HGBA1C" in the last 72 hours. CBG: No results for input(s): "GLUCAP" in the last 168 hours. Lipid Profile: No results for input(s): "CHOL", "HDL", "LDLCALC", "TRIG", "CHOLHDL", "LDLDIRECT" in the last 72 hours. Thyroid Function  Tests: No results for input(s): "TSH", "T4TOTAL", "FREET4", "T3FREE", "THYROIDAB" in the last 72 hours. Anemia Panel: No results for input(s): "VITAMINB12", "FOLATE", "FERRITIN", "TIBC", "IRON", "RETICCTPCT" in the last 72 hours. Urine analysis:    Component Value Date/Time   COLORURINE YELLOW 11/08/2021 2120   APPEARANCEUR CLEAR 11/08/2021 2120   LABSPEC 1.011  11/08/2021 2120   PHURINE 6.0 11/08/2021 2120   GLUCOSEU NEGATIVE 11/08/2021 2120   HGBUR SMALL (A) 11/08/2021 2120   BILIRUBINUR NEGATIVE 11/08/2021 2120   KETONESUR NEGATIVE 11/08/2021 2120   PROTEINUR NEGATIVE 11/08/2021 2120   NITRITE NEGATIVE 11/08/2021 2120   LEUKOCYTESUR TRACE (A) 11/08/2021 2120    Radiological Exams on Admission: CT Angio Chest PE W and/or Wo Contrast  Result Date: 10/16/2022 CLINICAL DATA:  Pulmonary embolism (PE) suspected, high prob Chest pain and shortness of breath. Radiologic records indicates history of pulmonary embolus. EXAM: CT ANGIOGRAPHY CHEST WITH CONTRAST TECHNIQUE: Multidetector CT imaging of the chest was performed using the standard protocol during bolus administration of intravenous contrast. Multiplanar CT image reconstructions and MIPs were obtained to evaluate the vascular anatomy. RADIATION DOSE REDUCTION: This exam was performed according to the departmental dose-optimization program which includes automated exposure control, adjustment of the mA and/or kV according to patient size and/or use of iterative reconstruction technique. CONTRAST:  75mL OMNIPAQUE IOHEXOL 350 MG/ML SOLN COMPARISON:  Radiograph earlier today. Chest CTA 05/12/2022 FINDINGS: Cardiovascular: The previous right-sided pulmonary emboli have resolved. No right-sided chronic thrombus. No acute pulmonary arterial filling defects or acute pulmonary emboli. Small focus of chronic thrombus within the left lower lobe segmental bifurcation is unchanged from prior exam, series 5, image 73. mild aortic atherosclerosis. No aortic  aneurysm. Mild cardiomegaly. No pericardial effusion. Mediastinum/Nodes: No enlarged mediastinal or hilar lymph nodes. Scattered small mediastinal nodes are not enlarged by size criteria. Thyroidectomy. Small hiatal hernia. Lungs/Pleura: Motion artifact in the lower lobes. Mild heterogeneous pulmonary parenchyma. No focal or confluent consolidation. No pleural fluid. No suspicious pulmonary nodule. The trachea and central airways are clear. Upper Abdomen: Layering sludge or gallstones in the gallbladder without inflammation. No acute upper abdominal finding. Musculoskeletal: There are no acute or suspicious osseous abnormalities. Bilateral gynecomastia. Review of the MIP images confirms the above findings. IMPRESSION: 1. No acute pulmonary emboli. The previous right-sided pulmonary emboli have resolved. Small focus of chronic thrombus within the left lower lobe segmental bifurcation is unchanged from prior exam. 2. Mild heterogeneous pulmonary parenchyma, can be seen with small airways disease. 3. Mild cardiomegaly. 4. Small hiatal hernia. 5. Layering sludge or gallstones in the gallbladder without inflammation. Aortic Atherosclerosis (ICD10-I70.0). Electronically Signed   By: Narda Rutherford M.D.   On: 10/16/2022 23:02   DG Chest Port 1 View  Result Date: 10/16/2022 CLINICAL DATA:  Left-sided chest pain, shortness of breath, and dizziness today. EXAM: PORTABLE CHEST 1 VIEW COMPARISON:  06/04/2022 FINDINGS: Shallow inspiration. Mild cardiac enlargement. No vascular congestion, edema, or consolidation. Linear atelectasis suggested in the left lung base. No pleural effusions. No pneumothorax. Mediastinal contours appear intact. Surgical clips in the base of the neck. IMPRESSION: Cardiac enlargement. Shallow inspiration with linear atelectasis in the left base. Electronically Signed   By: Burman Nieves M.D.   On: 10/16/2022 18:29      Assessment/Plan   Principal Problem:   Chest pain Active Problems:    Essential hypertension   DM2 (diabetes mellitus, type 2) (HCC)   Acquired hypothyroidism   Chronic pulmonary embolism (HCC)   Chronic diastolic CHF (congestive heart failure) (HCC)     #) Chest pain: 2 days of intermittent nonradiating left-sided chest pressure that worsens with exertion, improves with rest, and resolved following application nitroglycerin paste.  No residual chest pain at this time.  While troponin x 2 are nonelevated and trending down and EKG shows no evidence of acute ischemic changes, including no  evidence of ST elevation, atypical features associated with his chest pain over the last 2 days are notable, particular in this patient with multiple CAD risk factors, including age, as well as history of hypertension and type 2 diabetes mellitus.  Consequently, will admit for overnight observation to further evaluate for ACS. he underwent nuclear stress test in 2022, which reportedly showed evidence of old MI but no evidence of reversible ischemia.  EDP discussed patient's case with on-call cardiology fellow, who recommended admission to [facility] for further evaluation management of presenting chest pain, and conveyed the cardiology will formally consult and see the patient in the morning, with additional recommendations pending at that time.   While CTA chest showed no evidence of acute pulmonary embolism, it did show persistence of his known chronic left lower lobe thrombus, unchanged from most recent prior CTA.  Consequently, differential for the patient's presenting left-sided chest pain with pleuritic element includes potential contribution from this chronic left-sided pulmonary embolism.   He is status post full dose aspirin this evening.   Plan: Resume home high intensity atorvastatin.  Resume home beta-blocker.  Trend troponin.  Echocardiogram ordered for the morning.  Monitor on telemetry.  Check serum magnesium level.  Potassium chloride 40 mill equivalents p.o. x 1 dose now.   Repeat CMP in the morning.  Cardiology to formally consult in the morning.         #) Chronic Pulmonary embolism: Chronic left lower lobe thrombus, which appears unchanged on today CTA chest relative to CTA chest from December 2023.  Patient reports good compliance with chronic Eliquis.  He appears hemodynamically stable, without clinical evidence to suggest cor pulmonale at this time.   Plan: Continue outpatient chronic Eliquis therapy.               #) Type 2 Diabetes Mellitus: documented history of such.  Appears to be managed lifestyle modifications in the absence of any outpatient insulin or oral hypoglycemic agents.  Most recent hemoglobin A1c was found to be 11.4% when checked in December 2023.  Presenting blood sugar 220.    Plan: accuchecks QAC and HS with low dose SSI.  Add on hemoglobin A1c.               #) Essential Hypertension: documented h/o such, with outpatient antihypertensive regimen including metoprolol to tartrate.  SBP's in the ED today: 1 teens to 150s mmHg.   Plan: Close monitoring of subsequent BP via routine VS. resume home beta-blocker.                #) acquired hypothyroidism: documented h/o such, on Synthroid as outpatient.   Plan: cont home Synthroid.                    #) Chronic diastolic heart failure: documented history of such, with most recent echocardiogram performed in January 2024, which was notable for LVEF 50 to 55% as well as grade 1 diastolic dysfunction, with additional results as conveyed above. No clinical or radiographic evidence to suggest acutely decompensated heart failure at this time. home diuretic regimen reportedly consists of the following: Lasix, which appears to be taken on a as needed basis only.   Plan: monitor strict I's & O's and daily weights. Repeat CMP in AM. Check serum mag level.  Will follow for updated echocardiogram result, which has been ordered for the morning  in the setting of presenting chest discomfort, as further detailed above.  DVT prophylaxis: Continue outpatient Eliquis Code Status: Full code Family Communication: none Disposition Plan: Per Rounding Team Consults called: EDP has discussed patient's case with on-call cardiology, Dr. Mackie Pai, Who conveys that cardiology will formally consult and see the patient in the morning, as further detailed above.;  Admission status: Observation     I SPENT GREATER THAN 75  MINUTES IN CLINICAL CARE TIME/MEDICAL DECISION-MAKING IN COMPLETING THIS ADMISSION.      Chaney Born Vesna Kable DO [facility]  From 7PM - 7AM   10/17/2022, 1:08 AM

## 2022-10-17 NOTE — ED Notes (Signed)
ED TO INPATIENT HANDOFF REPORT  ED Nurse Name and Phone #: [phone]  S Name/Age/Gender Frank Shelton. 62 y.o. male Room/Bed: 016C/016C  Code Status   Code Status: Full Code  Home/SNF/Other Home Patient oriented to: self, place, time, and situation Is this baseline? Yes   Triage Complete: Triage complete  Chief Complaint Chest pain [R07.9]  Triage Note Pt via POV c/o nonradiating left-sided chest pain with associated SOB and dizziness since 10am today. Denies n/v/diaphoresis. Significant cardiac history including multiple heart attacks and prior blood clots, taking Eliquis. Pain rated 7/10 and shooting  Pt BIB [facility], ED to ED transfer from [facility]. Pt c/o chest pain and transferred for CTA.   Allergies Allergies  Allergen Reactions   Nabumetone Hives    Level of Care/Admitting Diagnosis ED Disposition     ED Disposition  Admit   Condition  --   Comment  Hospital Area: [facility] [[id number]]  Level of Care: Telemetry Cardiac [[id number]]  May place patient in observation at [facility] or [facility] if equivalent level of care is available:: No  Covid Evaluation: Asymptomatic - no recent exposure (last 10 days) testing not required  Diagnosis: Chest pain [454098]  Admitting Physician: Angie Fava [[id number]]  Attending Physician: Angie Fava [[id number]]          B Medical/Surgery History Past Medical History:  Diagnosis Date   Arthritis    L shoulder & fingers    Bronchitis    during enviromental changes    Complication of anesthesia    Report of airway problem   Diabetes mellitus without complication (HCC)    Dyspnea    Embolism - blood clot    Gout    Headache    uses ibuprofen & tylenol & its relieved    Hypertension    Hypothyroidism    Myocardial infarction (HCC) 2012   OSA on CPAP    Peripheral vascular disease (HCC)    Pulmonary embolism (HCC)    Stroke (HCC) 2014   Past Surgical History:  Procedure  Laterality Date   HELLER MYOTOMY  2014   [place]   IVC FILTER INSERTION  2010   ROTATOR CUFF REPAIR Right    SKIN GRAFT     x3- to face., child    THYROIDECTOMY N/A 03/09/2017   Procedure: TOTAL THYROIDECTOMY;  Surgeon: Darnell Level, MD;  Location: [facility] OR;  Service: General;  Laterality: N/A;   TOTAL THYROIDECTOMY  03/09/2017     A IV Location/Drains/Wounds Patient Lines/Drains/Airways Status     Active Line/Drains/Airways     Name Placement date Placement time Site Days   Peripheral IV 10/16/22 20 G Anterior;Left Forearm 10/16/22  1744  Forearm  1   Pressure Injury 05/18/22 Vertebral column Medial;Mid Deep Tissue Pressure Injury - Purple or maroon localized area of discolored intact skin or blood-filled blister due to damage of underlying soft tissue from pressure and/or shear. maroon line on midbac 05/18/22  1430  -- 152   Pressure Injury 05/18/22 Vertebral column Lower;Medial;Mid Deep Tissue Pressure Injury - Purple or maroon localized area of discolored intact skin or blood-filled blister due to damage of underlying soft tissue from pressure and/or shear. maroon line on  05/18/22  1430  -- 152            Intake/Output Last 24 hours No intake or output data in the 24 hours ending 10/17/22 0708  Labs/Imaging Results for orders placed or performed during the hospital  encounter of 10/16/22 (from the past 48 hour(s))  Basic metabolic panel     Status: Abnormal   Collection Time: 10/16/22  5:25 PM  Result Value Ref Range   Sodium 139 135 - 145 mmol/L   Potassium 3.8 3.5 - 5.1 mmol/L   Chloride 104 98 - 111 mmol/L   CO2 23 22 - 32 mmol/L   Glucose, Bld 220 (H) 70 - 99 mg/dL    Comment: Glucose reference range applies only to samples taken after fasting for at least 8 hours.   BUN 33 (H) 8 - 23 mg/dL   Creatinine, Ser 1.61 (H) 0.61 - 1.24 mg/dL   Calcium 9.7 8.9 - 09.6 mg/dL   GFR, Estimated 54 (L) >60 mL/min    Comment: (NOTE) Calculated using the CKD-EPI Creatinine  Equation (2021)    Anion gap 12 5 - 15    Comment: Performed at [facility], [address] [place], [place] [address]  CBC     Status: None   Collection Time: 10/16/22  5:25 PM  Result Value Ref Range   WBC 9.5 4.0 - 10.5 K/uL   RBC 5.18 4.22 - 5.81 MIL/uL   Hemoglobin 16.0 13.0 - 17.0 g/dL   HCT 98.1 19.1 - 47.8 %   MCV 90.9 80.0 - 100.0 fL   MCH 30.9 26.0 - 34.0 pg   MCHC 34.0 30.0 - 36.0 g/dL   RDW 29.5 62.1 - 30.8 %   Platelets 229 150 - 400 K/uL   nRBC 0.0 0.0 - 0.2 %    Comment: Performed at [facility], [address], [place], [place] [address]  Troponin I (High Sensitivity)     Status: None   Collection Time: 10/16/22  5:25 PM  Result Value Ref Range   Troponin I (High Sensitivity) 6 <18 ng/L    Comment: (NOTE) Elevated high sensitivity troponin I (hsTnI) values and significant  changes across serial measurements may suggest ACS but many other  chronic and acute conditions are known to elevate hsTnI results.  Refer to the "Links" section for chest pain algorithms and additional  guidance. Performed at [facility], [address], [place], [place] [address]   Protime-INR (order if Patient is taking Coumadin / Warfarin)     Status: Abnormal   Collection Time: 10/16/22  5:25 PM  Result Value Ref Range   Prothrombin Time 16.0 (H) 11.4 - 15.2 seconds   INR 1.3 (H) 0.8 - 1.2    Comment: (NOTE) INR goal varies based on device and disease states. Performed at [facility], [address], [place], [place] [address]   Troponin I (High Sensitivity)     Status: None   Collection Time: 10/16/22  6:55 PM  Result Value Ref Range   Troponin I (High Sensitivity) 5 <18 ng/L    Comment: (NOTE) Elevated high sensitivity troponin I (hsTnI) values and significant  changes across serial measurements may suggest ACS but many other  chronic and acute conditions are known to elevate hsTnI results.  Refer to the "Links" section for chest pain algorithms and additional   guidance. Performed at [facility], [address], [place], [place] [address]   CBC with Differential/Platelet     Status: None   Collection Time: 10/17/22  2:16 AM  Result Value Ref Range   WBC 10.5 4.0 - 10.5 K/uL   RBC 4.76 4.22 - 5.81 MIL/uL   Hemoglobin 14.6 13.0 - 17.0 g/dL   HCT 10.2 72.5 - 36.6 %  MCV 90.5 80.0 - 100.0 fL   MCH 30.7 26.0 - 34.0 pg   MCHC 33.9 30.0 - 36.0 g/dL   RDW 91.4 78.2 - 95.6 %   Platelets 236 150 - 400 K/uL   nRBC 0.0 0.0 - 0.2 %   Neutrophils Relative % 52 %   Neutro Abs 5.4 1.7 - 7.7 K/uL   Lymphocytes Relative 38 %   Lymphs Abs 4.0 0.7 - 4.0 K/uL   Monocytes Relative 7 %   Monocytes Absolute 0.8 0.1 - 1.0 K/uL   Eosinophils Relative 2 %   Eosinophils Absolute 0.2 0.0 - 0.5 K/uL   Basophils Relative 1 %   Basophils Absolute 0.1 0.0 - 0.1 K/uL   Immature Granulocytes 0 %   Abs Immature Granulocytes 0.03 0.00 - 0.07 K/uL    Comment: Performed at [facility], [address] [place], [place] [address]  Comprehensive metabolic panel     Status: Abnormal   Collection Time: 10/17/22  2:16 AM  Result Value Ref Range   Sodium 138 135 - 145 mmol/L   Potassium 3.8 3.5 - 5.1 mmol/L   Chloride 105 98 - 111 mmol/L   CO2 21 (L) 22 - 32 mmol/L   Glucose, Bld 119 (H) 70 - 99 mg/dL    Comment: Glucose reference range applies only to samples taken after fasting for at least 8 hours.   BUN 31 (H) 8 - 23 mg/dL   Creatinine, Ser 6.57 (H) 0.61 - 1.24 mg/dL   Calcium 9.4 8.9 - 84.6 mg/dL   Total Protein 7.3 6.5 - 8.1 g/dL   Albumin 3.7 3.5 - 5.0 g/dL   AST 22 15 - 41 U/L   ALT 20 0 - 44 U/L   Alkaline Phosphatase 59 38 - 126 U/L   Total Bilirubin 0.9 0.3 - 1.2 mg/dL   GFR, Estimated 51 (L) >60 mL/min    Comment: (NOTE) Calculated using the CKD-EPI Creatinine Equation (2021)    Anion gap 12 5 - 15    Comment: Performed at [facility], [address], [place], [place] [address]  Magnesium     Status: None   Collection Time: 10/17/22   2:16 AM  Result Value Ref Range   Magnesium 2.2 1.7 - 2.4 mg/dL    Comment: Performed at [facility], [address] [place], [place] [address]  CBG monitoring, ED     Status: Abnormal   Collection Time: 10/17/22  5:44 AM  Result Value Ref Range   Glucose-Capillary 153 (H) 70 - 99 mg/dL    Comment: Glucose reference range applies only to samples taken after fasting for at least 8 hours.  Troponin I (High Sensitivity)     Status: None   Collection Time: 10/17/22  5:50 AM  Result Value Ref Range   Troponin I (High Sensitivity) 6 <18 ng/L    Comment: (NOTE) Elevated high sensitivity troponin I (hsTnI) values and significant  changes across serial measurements may suggest ACS but many other  chronic and acute conditions are known to elevate hsTnI results.  Refer to the "Links" section for chest pain algorithms and additional  guidance. Performed at [facility], [address], [place], [place] [address]   Lipase, blood     Status: Abnormal   Collection Time: 10/17/22  5:50 AM  Result Value Ref Range   Lipase 56 (H) 11 - 51 U/L    Comment: Performed at [facility],  [address] [place], [place] [address]   CT Angio Chest PE W and/or Wo Contrast  Result Date: 10/16/2022 CLINICAL DATA:  Pulmonary embolism (PE) suspected, high prob Chest pain and shortness of breath. Radiologic records indicates history of pulmonary embolus. EXAM: CT ANGIOGRAPHY CHEST WITH CONTRAST TECHNIQUE: Multidetector CT imaging of the chest was performed using the standard protocol during bolus administration of intravenous contrast. Multiplanar CT image reconstructions and MIPs were obtained to evaluate the vascular anatomy. RADIATION DOSE REDUCTION: This exam was performed according to the departmental dose-optimization program which includes automated exposure control, adjustment of the mA and/or kV according to patient size and/or use of iterative reconstruction technique. CONTRAST:  75mL  OMNIPAQUE IOHEXOL 350 MG/ML SOLN COMPARISON:  Radiograph earlier today. Chest CTA 05/12/2022 FINDINGS: Cardiovascular: The previous right-sided pulmonary emboli have resolved. No right-sided chronic thrombus. No acute pulmonary arterial filling defects or acute pulmonary emboli. Small focus of chronic thrombus within the left lower lobe segmental bifurcation is unchanged from prior exam, series 5, image 73. mild aortic atherosclerosis. No aortic aneurysm. Mild cardiomegaly. No pericardial effusion. Mediastinum/Nodes: No enlarged mediastinal or hilar lymph nodes. Scattered small mediastinal nodes are not enlarged by size criteria. Thyroidectomy. Small hiatal hernia. Lungs/Pleura: Motion artifact in the lower lobes. Mild heterogeneous pulmonary parenchyma. No focal or confluent consolidation. No pleural fluid. No suspicious pulmonary nodule. The trachea and central airways are clear. Upper Abdomen: Layering sludge or gallstones in the gallbladder without inflammation. No acute upper abdominal finding. Musculoskeletal: There are no acute or suspicious osseous abnormalities. Bilateral gynecomastia. Review of the MIP images confirms the above findings. IMPRESSION: 1. No acute pulmonary emboli. The previous right-sided pulmonary emboli have resolved. Small focus of chronic thrombus within the left lower lobe segmental bifurcation is unchanged from prior exam. 2. Mild heterogeneous pulmonary parenchyma, can be seen with small airways disease. 3. Mild cardiomegaly. 4. Small hiatal hernia. 5. Layering sludge or gallstones in the gallbladder without inflammation. Aortic Atherosclerosis (ICD10-I70.0). Electronically Signed   By: Narda Rutherford M.D.   On: 10/16/2022 23:02   DG Chest Port 1 View  Result Date: 10/16/2022 CLINICAL DATA:  Left-sided chest pain, shortness of breath, and dizziness today. EXAM: PORTABLE CHEST 1 VIEW COMPARISON:  06/04/2022 FINDINGS: Shallow inspiration. Mild cardiac enlargement. No vascular  congestion, edema, or consolidation. Linear atelectasis suggested in the left lung base. No pleural effusions. No pneumothorax. Mediastinal contours appear intact. Surgical clips in the base of the neck. IMPRESSION: Cardiac enlargement. Shallow inspiration with linear atelectasis in the left base. Electronically Signed   By: Burman Nieves M.D.   On: 10/16/2022 18:29    Pending Labs Unresulted Labs (From admission, onward)     Start     Ordered   10/17/22 0446  Hemoglobin A1c  Add-on,   AD       Comments: To assess prior glycemic control    10/17/22 0445            Vitals/Pain Today's Vitals   10/16/22 2230 10/17/22 0340 10/17/22 0407 10/17/22 0555  BP: 116/79  113/66 (!) 103/56  Pulse: 78  82 72  Resp: 16  18 19   Temp:  (!) 97.5 F (36.4 C)    TempSrc:  Oral    SpO2: 100%  98% 100%  Weight:      Height:      PainSc:        Isolation Precautions No active isolations  Medications Medications  acetaminophen (TYLENOL) tablet 650 mg (has no administration in time  range)    Or  acetaminophen (TYLENOL) suppository 650 mg (has no administration in time range)  melatonin tablet 3 mg (has no administration in time range)  ondansetron (ZOFRAN) injection 4 mg (has no administration in time range)  apixaban (ELIQUIS) tablet 5 mg (has no administration in time range)  atorvastatin (LIPITOR) tablet 40 mg (has no administration in time range)  levothyroxine (SYNTHROID) tablet 250 mcg (250 mcg Oral Given 10/17/22 0552)  metoprolol tartrate (LOPRESSOR) tablet 25 mg (has no administration in time range)  insulin aspart (novoLOG) injection 0-9 Units (2 Units Subcutaneous Given 10/17/22 0553)  aspirin chewable tablet 324 mg (324 mg Oral Given 10/16/22 2100)  nitroGLYCERIN (NITROGLYN) 2 % ointment 1 inch (1 inch Topical Given 10/16/22 2100)  iohexol (OMNIPAQUE) 350 MG/ML injection 75 mL (75 mLs Intravenous Contrast Given 10/16/22 2243)  potassium chloride SA (KLOR-CON M) CR tablet 40 mEq (40 mEq  Oral Given 10/17/22 0509)    Mobility walks     Focused Assessments Pulmonary Assessment Handoff:  Lung sounds:   O2 Device: Room Air      R Recommendations: See Admitting Provider Note  Report given to:   Additional Notes:

## 2022-10-17 NOTE — Plan of Care (Signed)
Seen and examined this AM on 3E. Admitted for chest pain workup, Echo ordered and cardiology to see in AM.

## 2022-10-18 DIAGNOSIS — R079 Chest pain, unspecified: Secondary | ICD-10-CM | POA: Diagnosis not present

## 2022-10-18 LAB — GLUCOSE, CAPILLARY
Glucose-Capillary: 124 mg/dL — ABNORMAL HIGH (ref 70–99)
Glucose-Capillary: 131 mg/dL — ABNORMAL HIGH (ref 70–99)
Glucose-Capillary: 140 mg/dL — ABNORMAL HIGH (ref 70–99)
Glucose-Capillary: 150 mg/dL — ABNORMAL HIGH (ref 70–99)

## 2022-10-18 LAB — COMPREHENSIVE METABOLIC PANEL
ALT: 19 U/L (ref 0–44)
AST: 19 U/L (ref 15–41)
Albumin: 3.2 g/dL — ABNORMAL LOW (ref 3.5–5.0)
Alkaline Phosphatase: 56 U/L (ref 38–126)
Anion gap: 11 (ref 5–15)
BUN: 31 mg/dL — ABNORMAL HIGH (ref 8–23)
CO2: 19 mmol/L — ABNORMAL LOW (ref 22–32)
Calcium: 8.7 mg/dL — ABNORMAL LOW (ref 8.9–10.3)
Chloride: 108 mmol/L (ref 98–111)
Creatinine, Ser: 1.67 mg/dL — ABNORMAL HIGH (ref 0.61–1.24)
GFR, Estimated: 46 mL/min — ABNORMAL LOW (ref >60)
Glucose, Bld: 198 mg/dL — ABNORMAL HIGH (ref 70–99)
Potassium: 4 mmol/L (ref 3.5–5.1)
Sodium: 138 mmol/L (ref 135–145)
Total Bilirubin: 0.8 mg/dL (ref 0.3–1.2)
Total Protein: 6.1 g/dL — ABNORMAL LOW (ref 6.5–8.1)

## 2022-10-18 LAB — HEMOGLOBIN A1C
Hgb A1c MFr Bld: 7.6 % — ABNORMAL HIGH (ref 4.8–5.6)
Mean Plasma Glucose: 171 mg/dL

## 2022-10-18 LAB — LIPASE, BLOOD: Lipase: 48 U/L (ref 11–51)

## 2022-10-18 NOTE — Care Management Obs Status (Signed)
MEDICARE OBSERVATION STATUS NOTIFICATION   Patient Details  Name: Frank Shelton. MRN: [id number] Date of Birth: 1960/12/14   Medicare Observation Status Notification Given:  Yes    Leone Haven, RN 10/18/2022, 10:07 AM

## 2022-10-18 NOTE — Progress Notes (Signed)
PROGRESS NOTE  Frank Shelton.  VWU:[id number] DOB: June 17, 1960 DOA: 10/16/2022 PCP: Frank [place] Darby, FNP   Brief Narrative: Patient is a 62 year old male with history of chronic PE on Eliquis, cardiac arrest, type 2 diabetes, hypertension, hypothyroidism, chronic diastolic CHF who presented with complaint of chest pain, right upper quadrant abdominal pain.  On presentation, he was hemodynamically stable.  Lab work showed creatinine of 1.4.  EKG showed ST changes.  CTA chest did not show any acute PE.  Cardiology was initially consulted for chest pain but it has been ruled out.  Patient continues to complain of right upper quadrant discomfort.  Imagings have showed cholelithiasis,, distended gallbladder.  General surgery consulted today, plan for HIDA scan  Assessment & Plan:  Principal Problem:   Chest pain Active Problems:   Essential hypertension   DM2 (diabetes mellitus, type 2) (HCC)   Acquired hypothyroidism   Chronic pulmonary embolism (HCC)   Chronic diastolic CHF (congestive heart failure) (HCC)  Chest pain/coronary artery disease: Presented with left-sided chest pressure associated with exertion.  Troponins were not elevated.  EKG did not show any ischemic changes.  Cardiology was initially consulted.  Chest pain is atypical.  Echo did not show any significant change from January this year.  Patient was most likely associated  with gallbladder issue.  CT chest did not show any acute PE.  cardiology signed off.  Chest pain has resolved.  Right upper quadrant abdominal pain: He has been having right upper quadrant pain since last few months.  Continues to complain of pain on that area.  Liver enzymes normal.  He is afebrile, no leukocytosis.  Ultrasound of the gallbladder showed gallbladder distention, stones, wall thickening of the 3 millimeter.  We have order HIDA scan per general surgery consulted.  He may need cholecystectomy as an outpatient or inpatient..  History of diastolic  CHF: Currently appears euvolemic.  Echo done here showed EF of 55 to 60%, normal diastolic parameters  CKD stage IIIb: Baseline creatinine around 1.9.  Likely kidney function at baseline.  History of recurrent PE: CTA chest on this admission did not show any acute PE but showed a stable left lower lobe thrombus.  Continue Eliquis  Hypertension: Continue metoprolol  Hypothyroidism: Continue Synthyroid  Type 2 diabetes: Most recent A1c of 7.6.  Continue current insulin regimen.  Morbid obesity: BMI 39.7      DVT prophylaxis:SCDs Start: 10/17/22 0103 apixaban (ELIQUIS) tablet 5 mg     Code Status: Full Code  Family Communication: None at the bedside at  Patient status: Inpatient  Patient is from : Home  Anticipated discharge to: Home  Estimated DC date: After full workup   Consultants: General surgery, cardiology  Procedures: None  Antimicrobials:  Anti-infectives (From admission, onward)    None       Subjective: Patient seen and examined at bedside today.  Hemodynamically stable sitting in the chair.  Denies any chest pain today but has right upper quadrant discomfort.  There was tenderness on the right upper quadrant on palpation.  No nausea or vomiting.  Objective: Vitals:   10/17/22 2025 10/18/22 0013 10/18/22 0358 10/18/22 0836  BP: (!) 102/56 (!) 98/56 (!) 103/56 127/66  Pulse: 77 78 72 76  Resp: 20 18 18 20   Temp: 98.2 F (36.8 C) 98.2 F (36.8 C) 97.8 F (36.6 C) 97.8 F (36.6 C)  TempSrc: Oral Oral Oral Oral  SpO2: 99% 97% 98% 98%  Weight:   (!) 148 kg  Height:        Intake/Output Summary (Last 24 hours) at 10/18/2022 1101 Last data filed at 10/17/2022 2100 Gross per 24 hour  Intake 240 ml  Output --  Net 240 ml   Filed Weights   10/17/22 0810 10/17/22 0814 10/18/22 0358  Weight: (!) 148.3 kg (!) 148.3 kg (!) 148 kg    Examination:  General exam: Overall comfortable, not in distress, morbidly obese HEENT: PERRL Respiratory system:   no wheezes or crackles  Cardiovascular system: S1 & S2 heard, RRR.  Gastrointestinal system: Abdomen is nondistended, soft and tender in the right upper quadrant Central nervous system: Alert and oriented Extremities: No edema, no clubbing ,no cyanosis Skin: No rashes, no ulcers,no icterus     Data Reviewed: I have personally reviewed following labs and imaging studies  CBC: Recent Labs  Lab 10/16/22 1725 10/17/22 0216  WBC 9.5 10.5  NEUTROABS  --  5.4  HGB 16.0 14.6  HCT 47.1 43.1  MCV 90.9 90.5  PLT 229 236   Basic Metabolic Panel: Recent Labs  Lab 10/16/22 1725 10/17/22 0216 10/18/22 0825  NA 139 138 138  K 3.8 3.8 4.0  CL 104 105 108  CO2 23 21* 19*  GLUCOSE 220* 119* 198*  BUN 33* 31* 31*  CREATININE 1.46* 1.53* 1.67*  CALCIUM 9.7 9.4 8.7*  MG  --  2.2  --      No results found for this or any previous visit (from the past 240 hour(s)).   Radiology Studies: US Abdomen Limited RUQ (LIVER/GB)  Result Date: 10/17/2022 CLINICAL DATA:  Right upper quadrant pain EXAM: ULTRASOUND ABDOMEN LIMITED RIGHT UPPER QUADRANT COMPARISON:  CT angiogram chest 10/16/2022 FINDINGS: Gallbladder: Mildly distended gallbladder with multiple stones. No adjacent fluid. Borderline wall thickening at 3 mm Common bile duct: Diameter: 6 mm Liver: Diffusely echogenic hepatic parenchyma consistent with fatty liver infiltration. Portal vein is patent on color Doppler imaging with normal direction of blood flow towards the liver. Other: None. IMPRESSION: Distended gallbladder with stones. No adjacent fluid. Borderline wall thickness of 3 mm. If there is further concern of acute cholecystitis, HIDA scan could be considered as clinically appropriate No ductal dilatation. Electronically Signed   By: Karen Kays M.D.   On: 10/17/2022 18:38   ECHOCARDIOGRAM COMPLETE  Result Date: 10/17/2022    ECHOCARDIOGRAM REPORT   Patient Name:   Frank Shelton. Date of Exam: 10/17/2022 Medical Rec #:  [id number]           Height:       76.0 in Accession #:    [id number]         Weight:       326.9 lb Date of Birth:  [birth year]          BSA:          2.730 m Patient Age:    61 years           BP:           101/86 mmHg Patient Gender: M                  HR:           75 bpm. Exam Location:  Inpatient Procedure: 2D Echo, Cardiac Doppler and Color Doppler Indications:    Chest Pain  History:        Patient has prior history of Echocardiogram examinations, most  recent 05/18/2022. CHF, Previous Myocardial Infarction and CAD,                 Arrythmias:Cardiac Arrest, Signs/Symptoms:Chest Pain; Risk                 Factors:Diabetes and Hypertension.  Sonographer:    Darlys Gales Referring Phys: [id number] Angie Fava  Sonographer Comments: Technically difficult study due to poor echo windows. Image acquisition challenging due to respiratory motion. IMPRESSIONS  1. Technically difficult study with very limited visualization of cardiac structures.  2. Left ventricular ejection fraction, by estimation, is 55 to 60%. The left ventricle has normal function. The left ventricle has no regional wall motion abnormalities. Left ventricular diastolic parameters were normal.  3. Right ventricular systolic function is normal. The right ventricular size is normal.  4. The mitral valve was not well visualized. No evidence of mitral valve regurgitation. No evidence of mitral stenosis.  5. The aortic valve is normal in structure. There is mild calcification of the aortic valve. Aortic valve regurgitation is not visualized. No aortic stenosis is present. FINDINGS  Left Ventricle: Left ventricular ejection fraction, by estimation, is 55 to 60%. The left ventricle has normal function. The left ventricle has no regional wall motion abnormalities. The left ventricular internal cavity size was normal in size. There is  no left ventricular hypertrophy. Left ventricular diastolic parameters were normal. Right Ventricle: The right ventricular  size is normal. No increase in right ventricular wall thickness. Right ventricular systolic function is normal. Left Atrium: Left atrial size was normal in size. Right Atrium: Right atrial size was normal in size. Pericardium: There is no evidence of pericardial effusion. Mitral Valve: The mitral valve was not well visualized. No evidence of mitral valve regurgitation. No evidence of mitral valve stenosis. Tricuspid Valve: The tricuspid valve is not well visualized. Tricuspid valve regurgitation is not demonstrated. No evidence of tricuspid stenosis. Aortic Valve: The aortic valve is normal in structure. There is mild calcification of the aortic valve. Aortic valve regurgitation is not visualized. No aortic stenosis is present. Aortic valve mean gradient measures 3.0 mmHg. Aortic valve peak gradient measures 5.7 mmHg. Aortic valve area, by VTI measures 3.13 cm. Pulmonic Valve: The pulmonic valve was not assessed. Pulmonic valve regurgitation is not visualized. No evidence of pulmonic stenosis. Aorta: The aortic root is normal in size and structure. Venous: The inferior vena cava was not well visualized. IAS/Shunts: No atrial level shunt detected by color flow Doppler.  LEFT VENTRICLE PLAX 2D LVIDd:         4.30 cm   Diastology LVIDs:         2.70 cm   LV e' medial:    6.64 cm/s LV PW:         0.80 cm   LV E/e' medial:  9.6 LV IVS:        1.00 cm   LV e' lateral:   10.60 cm/s LVOT diam:     2.00 cm   LV E/e' lateral: 6.0 LV SV:         68 LV SV Index:   25 LVOT Area:     3.14 cm  RIGHT VENTRICLE RV S prime:     9.14 cm/s TAPSE (M-mode): 2.5 cm LEFT ATRIUM             Index        RIGHT ATRIUM           Index LA Vol (A2C):  83.5 ml 30.59 ml/m  RA Area:     14.60 cm LA Vol (A4C):   76.1 ml 27.88 ml/m  RA Volume:   32.90 ml  12.05 ml/m LA Biplane Vol: 81.6 ml 29.89 ml/m  AORTIC VALVE AV Area (Vmax):    2.61 cm AV Area (Vmean):   2.79 cm AV Area (VTI):     3.13 cm AV Vmax:           119.00 cm/s AV Vmean:           83.100 cm/s AV VTI:            0.218 m AV Peak Grad:      5.7 mmHg AV Mean Grad:      3.0 mmHg LVOT Vmax:         98.80 cm/s LVOT Vmean:        73.800 cm/s LVOT VTI:          0.217 m LVOT/AV VTI ratio: 1.00 MITRAL VALVE MV Area (PHT): 2.40 cm    SHUNTS MV Decel Time: 316 msec    Systemic VTI:  0.22 m MV E velocity: 63.90 cm/s  Systemic Diam: 2.00 cm MV A velocity: 76.00 cm/s MV E/A ratio:  0.84 Aditya Sabharwal Electronically signed by Dorthula Nettles Signature Date/Time: 10/17/2022/11:07:03 AM    Final    CT Angio Chest PE W and/or Wo Contrast  Result Date: 10/16/2022 CLINICAL DATA:  Pulmonary embolism (PE) suspected, high prob Chest pain and shortness of breath. Radiologic records indicates history of pulmonary embolus. EXAM: CT ANGIOGRAPHY CHEST WITH CONTRAST TECHNIQUE: Multidetector CT imaging of the chest was performed using the standard protocol during bolus administration of intravenous contrast. Multiplanar CT image reconstructions and MIPs were obtained to evaluate the vascular anatomy. RADIATION DOSE REDUCTION: This exam was performed according to the departmental dose-optimization program which includes automated exposure control, adjustment of the mA and/or kV according to patient size and/or use of iterative reconstruction technique. CONTRAST:  75mL OMNIPAQUE IOHEXOL 350 MG/ML SOLN COMPARISON:  Radiograph earlier today. Chest CTA 05/12/2022 FINDINGS: Cardiovascular: The previous right-sided pulmonary emboli have resolved. No right-sided chronic thrombus. No acute pulmonary arterial filling defects or acute pulmonary emboli. Small focus of chronic thrombus within the left lower lobe segmental bifurcation is unchanged from prior exam, series 5, image 73. mild aortic atherosclerosis. No aortic aneurysm. Mild cardiomegaly. No pericardial effusion. Mediastinum/Nodes: No enlarged mediastinal or hilar lymph nodes. Scattered small mediastinal nodes are not enlarged by size criteria. Thyroidectomy. Small  hiatal hernia. Lungs/Pleura: Motion artifact in the lower lobes. Mild heterogeneous pulmonary parenchyma. No focal or confluent consolidation. No pleural fluid. No suspicious pulmonary nodule. The trachea and central airways are clear. Upper Abdomen: Layering sludge or gallstones in the gallbladder without inflammation. No acute upper abdominal finding. Musculoskeletal: There are no acute or suspicious osseous abnormalities. Bilateral gynecomastia. Review of the MIP images confirms the above findings. IMPRESSION: 1. No acute pulmonary emboli. The previous right-sided pulmonary emboli have resolved. Small focus of chronic thrombus within the left lower lobe segmental bifurcation is unchanged from prior exam. 2. Mild heterogeneous pulmonary parenchyma, can be seen with small airways disease. 3. Mild cardiomegaly. 4. Small hiatal hernia. 5. Layering sludge or gallstones in the gallbladder without inflammation. Aortic Atherosclerosis (ICD10-I70.0). Electronically Signed   By: Narda Rutherford M.D.   On: 10/16/2022 23:02   DG Chest Port 1 View  Result Date: 10/16/2022 CLINICAL DATA:  Left-sided chest pain, shortness of breath, and dizziness today. EXAM: PORTABLE CHEST 1  VIEW COMPARISON:  06/04/2022 FINDINGS: Shallow inspiration. Mild cardiac enlargement. No vascular congestion, edema, or consolidation. Linear atelectasis suggested in the left lung base. No pleural effusions. No pneumothorax. Mediastinal contours appear intact. Surgical clips in the base of the neck. IMPRESSION: Cardiac enlargement. Shallow inspiration with linear atelectasis in the left base. Electronically Signed   By: Burman Nieves M.D.   On: 10/16/2022 18:29    Scheduled Meds:  apixaban  5 mg Oral BID   atorvastatin  40 mg Oral Daily   insulin aspart  0-9 Units Subcutaneous Q6H   levothyroxine  250 mcg Oral Q0600   metoprolol tartrate  25 mg Oral BID   pneumococcal 23 valent vaccine  0.5 mL Intramuscular Tomorrow-1000   Continuous  Infusions:   LOS: 0 days   Burnadette Pop, MD [facility] P6/08/2022, 11:01 AM

## 2022-10-18 NOTE — TOC Initial Note (Signed)
Transition of Care (TOC) - Initial/Assessment Note    Patient Details  Name: Frank Shelton. MRN: [id number] Date of Birth: [birth year]  Transition of Care [facility]) CM/SW Contact:    Leone Haven, RN Phone Number: 10/18/2022, 10:11 AM  Clinical Narrative:                 From home with sons, his PCP is Amy Agricultural engineer at [facility] in [facility]. He has insurance on file.  He has a walker, cane , w/chair at home, he does not have any HH services at this time ,he states his son will transport him home at [employer] and his son is his support system.  He gets his medications from [facility] thru [facility],  plan is for a Hida scan today, ? Gallstones. NPO.         Patient Goals and CMS Choice            Expected Discharge Plan and Services                                              Prior Living Arrangements/Services                       Activities of Daily Living Home Assistive Devices/Equipment: Environmental consultant (specify type), Shower chair without back, Bedside commode/3-in-1 ADL Screening (condition at time of admission) Patient's cognitive ability adequate to safely complete daily activities?: Yes Is the patient deaf or have difficulty hearing?: No Does the patient have difficulty seeing, even when wearing glasses/contacts?: No Does the patient have difficulty concentrating, remembering, or making decisions?: No Patient able to express need for assistance with ADLs?: Yes Does the patient have difficulty dressing or bathing?: No Independently performs ADLs?: Yes (appropriate for developmental age) Does the patient have difficulty walking or climbing stairs?: No Weakness of Legs: None Weakness of Arms/Hands: None  Permission Sought/Granted                  Emotional Assessment              Admission diagnosis:  Pleuritic chest pain [R07.81] Exertional dyspnea [R06.09] Chest pain [R07.9] Patient Active Problem List   Diagnosis Date Noted    Chest pain 10/17/2022   Chronic diastolic CHF (congestive heart failure) (HCC) 10/17/2022   Severe sepsis (HCC) 05/27/2022   AKI (acute kidney injury) (HCC) 05/26/2022   Encephalopathy acute 05/26/2022   Acute respiratory failure (HCC) 05/25/2022   Enterobacter cloacae pneumonia 05/25/2022   Septic shock (HCC) 05/25/2022   Acute respiratory failure with hypoxia and hypercapnia (HCC) 05/22/2022   Shock (HCC) 05/22/2022   Coffee ground emesis 05/16/2022   Upper GI bleed 05/15/2022   Chronic pulmonary embolism (HCC) 05/12/2022   COVID-19 virus infection 05/17/2020   Hyperglycemia 05/16/2020   Varicose veins of bilateral lower extremities with other complications 06/06/2017   Acquired hypothyroidism 05/04/2017   Multiple thyroid nodules 01/26/2017   DM2 (diabetes mellitus, type 2) (HCC) 01/10/2017   Primary osteoarthritis of both first carpometacarpal joints 02/27/2014   Atypical chest pain 01/09/2014   Hypercoagulopathy (HCC) 01/09/2014   DVT (deep venous thrombosis) (HCC) 01/12/2012   Cellulitis of left lower extremity 01/12/2012   ASTHMA 08/02/2010   NUMBNESS, HAND 08/02/2010   GOUT 07/30/2010   Morbid obesity (HCC) 07/30/2010   OBSTRUCTIVE SLEEP APNEA 07/30/2010   RESTLESS  LEG SYNDROME 07/30/2010   Essential hypertension 07/30/2010   ALLERGIC RHINITIS 07/30/2010   Other specified chronic obstructive pulmonary disease 07/30/2010   GERD 07/30/2010   PULMONARY EMBOLISM, HX OF 07/30/2010   GASTROINTESTINAL XRAY, ABNORMAL 09/14/2009   OTHER DYSPHAGIA 08/24/2009   PCP:  Vanessa Manti, FNP Pharmacy:   [facility] - [place], [place] - [address] [place] [address] Phone: [phone] Fax: [phone]  [facility] [address], [place] - [address] [place] [address] Phone: [phone] Fax: [phone]  [id number] [place], [place] - [address] AT [facility]  [facility] [address] [place] [address] Phone: [phone] Fax: [phone]  [name] [address] [place] [address] Phone: [phone] Fax: [phone]     Social Determinants of Health (SDOH) Social History: SDOH Screenings   Food Insecurity: No Food Insecurity (10/17/2022)  Housing: Low Risk  (10/17/2022)  Transportation Needs: No Transportation Needs (10/17/2022)  Utilities: Not At Risk (10/17/2022)  Tobacco Use: Medium Risk (10/17/2022)   SDOH Interventions:     Readmission Risk Interventions     No data to display

## 2022-10-19 ENCOUNTER — Encounter (HOSPITAL_COMMUNITY): Payer: Self-pay | Admitting: Internal Medicine

## 2022-10-19 ENCOUNTER — Observation Stay (HOSPITAL_COMMUNITY): Payer: Medicare PPO

## 2022-10-19 DIAGNOSIS — R079 Chest pain, unspecified: Secondary | ICD-10-CM | POA: Diagnosis not present

## 2022-10-19 LAB — CBC
HCT: 38.1 % — ABNORMAL LOW (ref 39.0–52.0)
Hemoglobin: 12.8 g/dL — ABNORMAL LOW (ref 13.0–17.0)
MCH: 30.1 pg (ref 26.0–34.0)
MCHC: 33.6 g/dL (ref 30.0–36.0)
MCV: 89.6 fL (ref 80.0–100.0)
Platelets: 212 10*3/uL (ref 150–400)
RBC: 4.25 MIL/uL (ref 4.22–5.81)
RDW: 13.9 % (ref 11.5–15.5)
WBC: 10.1 10*3/uL (ref 4.0–10.5)
nRBC: 0 % (ref 0.0–0.2)

## 2022-10-19 LAB — GLUCOSE, CAPILLARY
Glucose-Capillary: 123 mg/dL — ABNORMAL HIGH (ref 70–99)
Glucose-Capillary: 141 mg/dL — ABNORMAL HIGH (ref 70–99)
Glucose-Capillary: 167 mg/dL — ABNORMAL HIGH (ref 70–99)
Glucose-Capillary: 183 mg/dL — ABNORMAL HIGH (ref 70–99)

## 2022-10-19 LAB — SURGICAL PCR SCREEN
MRSA, PCR: NEGATIVE
Staphylococcus aureus: NEGATIVE

## 2022-10-19 LAB — BASIC METABOLIC PANEL
Anion gap: 9 (ref 5–15)
BUN: 32 mg/dL — ABNORMAL HIGH (ref 8–23)
CO2: 23 mmol/L (ref 22–32)
Calcium: 8.8 mg/dL — ABNORMAL LOW (ref 8.9–10.3)
Chloride: 106 mmol/L (ref 98–111)
Creatinine, Ser: 1.54 mg/dL — ABNORMAL HIGH (ref 0.61–1.24)
GFR, Estimated: 50 mL/min — ABNORMAL LOW (ref >60)
Glucose, Bld: 168 mg/dL — ABNORMAL HIGH (ref 70–99)
Potassium: 4 mmol/L (ref 3.5–5.1)
Sodium: 138 mmol/L (ref 135–145)

## 2022-10-19 MED ORDER — CEFAZOLIN SODIUM-DEXTROSE 2-4 GM/100ML-% IV SOLN
2.0000 g | Freq: Once | INTRAVENOUS | Status: DC
  Filled 2022-10-19: qty 100

## 2022-10-19 MED ORDER — TECHNETIUM TC 99M MEBROFENIN IV KIT
5.3000 | PACK | Freq: Once | INTRAVENOUS | Status: AC | PRN
  Administered 2022-10-19: 5.3 via INTRAVENOUS

## 2022-10-19 MED ORDER — MUPIROCIN 2 % EX OINT
1.0000 | TOPICAL_OINTMENT | Freq: Two times a day (BID) | CUTANEOUS | Status: DC
  Administered 2022-10-19 – 2022-10-20 (×2): 1 via NASAL
  Filled 2022-10-19 (×2): qty 22

## 2022-10-19 NOTE — Progress Notes (Signed)
PROGRESS NOTE  Frank Shelton.  ZOX:[id number] DOB: [birth year] DOA: 10/16/2022 PCP: Vanessa Patterson, FNP   Brief Narrative: Patient is a 62 year old male with history of chronic PE on Eliquis, cardiac arrest, type 2 diabetes, hypertension, hypothyroidism, chronic diastolic CHF who presented with complaint of chest pain, right upper quadrant abdominal pain.  On presentation, he was hemodynamically stable.  Lab work showed creatinine of 1.4.  EKG showed ST changes.  CTA chest did not show any acute PE.  Cardiology was initially consulted for chest pain but it has been ruled out.  Patient continues to complain of right upper quadrant discomfort.  Imagings have showed cholelithiasis,, distended gallbladder.  General surgery consulted ,plan for lap chole tomorrow  Assessment & Plan:  Principal Problem:   Chest pain Active Problems:   Essential hypertension   DM2 (diabetes mellitus, type 2) (HCC)   Acquired hypothyroidism   Chronic pulmonary embolism (HCC)   Chronic diastolic CHF (congestive heart failure) (HCC)  Chest pain/coronary artery disease: Presented with left-sided chest pressure associated with exertion but radiating from RUQ.  Troponins were not elevated.  EKG did not show any ischemic changes.  Cardiology was initially consulted.  Chest pain is atypical.  Echo did not show any significant change from January this year.  Patient was most likely associated  with gallbladder issue.  CT chest did not show any acute PE.  cardiology signed off.  Chest pain has resolved.  Right upper quadrant abdominal pain: He has been having right upper quadrant pain since last few months.  Continues to complain of pain on that area.  Liver enzymes normal.  He is afebrile, no leukocytosis.  Ultrasound of the gallbladder showed gallbladder distention, stones, wall thickening of the 3 millimeter.  HID negative.  Since he has persistent symptoms, general surgery planning for lap chole tomorrow  History of  diastolic CHF: Currently appears euvolemic.  Echo done here showed EF of 55 to 60%, normal diastolic parameters  CKD stage IIIb: Baseline creatinine around 1.9.  Likely kidney function at baseline.  History of recurrent PE: CTA chest on this admission did not show any acute PE but showed a stable left lower lobe thrombus.  On Eliquis, currently held for upcoming surgery  Hypertension: Continue metoprolol  Hypothyroidism: Continue Synthyroid  Type 2 diabetes: Most recent A1c of 7.6.  Continue current insulin regimen.  Morbid obesity: BMI 39.7      DVT prophylaxis:SCDs Start: 10/17/22 0103     Code Status: Full Code  Family Communication: None at the bedside   Patient status: Inpatient  Patient is from : Home  Anticipated discharge to: Home  Estimated DC date: Tomorrow or day after tomorrow   Consultants: General surgery, cardiology  Procedures: None  Antimicrobials:  Anti-infectives (From admission, onward)    None       Subjective: Patient seen and examined the bedside today.  Hemodynamically stable.  Eating his  lunch.  Continues to complain of right upper quadrant discomfort that radiates to the left-sided chest.  No nausea or vomiting  Objective: Vitals:   10/19/22 0008 10/19/22 0553 10/19/22 0722 10/19/22 1115  BP: (!) 104/52 (!) 103/52 97/61 102/62  Pulse: 70 65 74 71  Resp: 18 18 18 18   Temp: 97.6 F (36.4 C) 97.6 F (36.4 C) 98 F (36.7 C) 98 F (36.7 C)  TempSrc: Oral Oral Oral Oral  SpO2: 100% 100% 100% 96%  Weight:  (!) 149.1 kg    Height:  Intake/Output Summary (Last 24 hours) at 10/19/2022 1300 Last data filed at 10/19/2022 1116 Gross per 24 hour  Intake 960 ml  Output 1625 ml  Net -665 ml   Filed Weights   10/17/22 0814 10/18/22 0358 10/19/22 0553  Weight: (!) 148.3 kg (!) 148 kg (!) 149.1 kg    Examination:  General exam: Overall comfortable, not in distress, morbidly obese HEENT: PERRL Respiratory system:  no wheezes or  crackles  Cardiovascular system: S1 & S2 heard, RRR.  Gastrointestinal system: Abdomen is nondistended, soft and nontender.  Right upper quadrant tenderness Central nervous system: Alert and oriented Extremities: No edema, no clubbing ,no cyanosis Skin: No rashes, no ulcers,no icterus     Data Reviewed: I have personally reviewed following labs and imaging studies  CBC: Recent Labs  Lab 10/16/22 1725 10/17/22 0216 10/19/22 0057  WBC 9.5 10.5 10.1  NEUTROABS  --  5.4  --   HGB 16.0 14.6 12.8*  HCT 47.1 43.1 38.1*  MCV 90.9 90.5 89.6  PLT 229 236 212   Basic Metabolic Panel: Recent Labs  Lab 10/16/22 1725 10/17/22 0216 10/18/22 0825 10/19/22 0057  NA 139 138 138 138  K 3.8 3.8 4.0 4.0  CL 104 105 108 106  CO2 23 21* 19* 23  GLUCOSE 220* 119* 198* 168*  BUN 33* 31* 31* 32*  CREATININE 1.46* 1.53* 1.67* 1.54*  CALCIUM 9.7 9.4 8.7* 8.8*  MG  --  2.2  --   --      No results found for this or any previous visit (from the past 240 hour(s)).   Radiology Studies: NM Hepato W/EF  Result Date: 10/19/2022 CLINICAL DATA:  RIGHT upper quadrant discomfort. Distended gallbladder with stones identified on RIGHT upper quadrant ultrasound of 10/17/2022. EXAM: NUCLEAR MEDICINE HEPATOBILIARY IMAGING WITH GALLBLADDER EF TECHNIQUE: Sequential images of the abdomen were obtained out to 60 minutes following intravenous administration of radiopharmaceutical. After oral ingestion of Ensure, gallbladder ejection fraction was determined. At 60 min, normal ejection fraction is greater than 33%. RADIOPHARMACEUTICALS:  5.3 mCi Tc-55m  Choletec IV COMPARISON:  None Available. FINDINGS: Prompt uptake and biliary excretion of activity by the liver is seen. Gallbladder activity is visualized, consistent with patency of cystic duct. Biliary activity passes into small bowel, consistent with patent common bile duct. Calculated gallbladder ejection fraction is 90%. (Normal gallbladder ejection fraction with  Ensure is greater than 33%.) IMPRESSION: Normal HIDA scan. No evidence of acute cholecystitis. Normal gallbladder ejection fraction. Electronically Signed   By: Bary Richard M.D.   On: 10/19/2022 11:04   US Abdomen Limited RUQ (LIVER/GB)  Result Date: 10/17/2022 CLINICAL DATA:  Right upper quadrant pain EXAM: ULTRASOUND ABDOMEN LIMITED RIGHT UPPER QUADRANT COMPARISON:  CT angiogram chest 10/16/2022 FINDINGS: Gallbladder: Mildly distended gallbladder with multiple stones. No adjacent fluid. Borderline wall thickening at 3 mm Common bile duct: Diameter: 6 mm Liver: Diffusely echogenic hepatic parenchyma consistent with fatty liver infiltration. Portal vein is patent on color Doppler imaging with normal direction of blood flow towards the liver. Other: None. IMPRESSION: Distended gallbladder with stones. No adjacent fluid. Borderline wall thickness of 3 mm. If there is further concern of acute cholecystitis, HIDA scan could be considered as clinically appropriate No ductal dilatation. Electronically Signed   By: Karen Kays M.D.   On: 10/17/2022 18:38    Scheduled Meds:  atorvastatin  40 mg Oral Daily   insulin aspart  0-9 Units Subcutaneous Q6H   levothyroxine  250 mcg Oral Q0600  metoprolol tartrate  25 mg Oral BID   pneumococcal 23 valent vaccine  0.5 mL Intramuscular Tomorrow-1000   Continuous Infusions:   LOS: 0 days   Burnadette Pop, MD [facility] P6/09/2022, 1:00 PM

## 2022-10-19 NOTE — Consult Note (Signed)
Consult Note  Frank Shelton. 10-15-1960  [id number].    Requesting MD: Dr. Renford Dills Chief Complaint/Reason for Consult: RUQ abdominal pain, cholelithiasis  HPI:  62 y.o. male with medical history significant for chronic PE on eliquis, h/o cardiac arrest, CAD, CHF, CKD3b, HTN, hypothyroidism s/p thyroidectomy by Dr. Gerrit Friends, T2DM who presented to [facility] ED 6/2 with chest pain. He was transferred to [facility] and admitted to the hospitalist service for further evaluation and treatment.  Since admission he has been seen in consultation by cardiology for work up of atypical chest pain with cardiology favoring non ACS etiology of his symptoms  He began to have RUQ pain Sunday with US showing cholelithiasis and general surgery asked to see. He has history of chronic RLQ abdominal pain starting with cardiac arrest in December but a couple weeks ago began radiating in to his chest. This pain worsened leading to presentation to ED as above. Pain now mostly in RUQ with radiation to chest . Unclear if  worsened with PO intake. He denies fever, chills, n/v.  Substance use: no current alcohol use. Former cigarette smoker and smokeless tobacco use Blood thinners: eliquis last dose 6/3 2100 Past Surgeries: no prior abdominal surgeries   ROS: ROS reviewed and as above  Family History  Problem Relation Age of Onset   Heart attack Sister 83       Two stents   Heart attack Mother    Heart attack Maternal Grandmother    Heart attack Maternal Grandfather     Past Medical History:  Diagnosis Date   Arthritis    L shoulder & fingers    Bronchitis    during enviromental changes    Complication of anesthesia    Report of airway problem   Diabetes mellitus without complication (HCC)    Dyspnea    Embolism - blood clot    Gout    Headache    uses ibuprofen & tylenol & its relieved    Hypertension    Hypothyroidism    Myocardial infarction (HCC) 2012   OSA on CPAP    Peripheral vascular  disease (HCC)    Pulmonary embolism (HCC)    Stroke (HCC) 2014    Past Surgical History:  Procedure Laterality Date   HELLER MYOTOMY  2014   [place]   IVC FILTER INSERTION  2010   ROTATOR CUFF REPAIR Right    SKIN GRAFT     x3- to face., child    THYROIDECTOMY N/A 03/09/2017   Procedure: TOTAL THYROIDECTOMY;  Surgeon: Darnell Level, MD;  Location: [facility] OR;  Service: General;  Laterality: N/A;   TOTAL THYROIDECTOMY  03/09/2017    Social History:  reports that he quit smoking about 16 years ago. His smoking use included cigarettes. He started smoking about 52 years ago. He has a 30.00 pack-year smoking history. He has quit using smokeless tobacco. He reports that he does not currently use alcohol. He reports that he does not use drugs.  Allergies:  Allergies  Allergen Reactions   Nabumetone Hives    Medications Prior to Admission  Medication Sig Dispense Refill   albuterol (VENTOLIN HFA) 108 (90 Base) MCG/ACT inhaler Inhale 2 puffs into the lungs every 4 (four) hours as needed for wheezing or shortness of breath. 8 g 2   apixaban (ELIQUIS) 5 MG TABS tablet Take 1 tablet (5 mg total) by mouth 2 (two) times daily. 60 tablet 0   atorvastatin (LIPITOR) 40 MG tablet  Take 40 mg by mouth daily.     budesonide-formoterol (SYMBICORT) 160-4.5 MCG/ACT inhaler Inhale 2 puffs into the lungs 2 (two) times daily as needed (for shortness of breath).      calcium-vitamin D (OSCAL 500/200 D-3) 500-200 MG-UNIT tablet Take 1 tablet by mouth 2 (two) times daily. 60 tablet 1   levothyroxine (SYNTHROID) 125 MCG tablet Take 2 tablets (250 mcg total) by mouth daily at 6 (six) AM. 60 tablet 0   metoprolol tartrate (LOPRESSOR) 25 MG tablet Take 1 tablet (25 mg total) by mouth 2 (two) times daily. 60 tablet 0   Multiple Vitamin (MULTIVITAMIN WITH MINERALS) TABS tablet Take 1 tablet by mouth daily.     sodium bicarbonate 650 MG tablet Take 1 tablet (650 mg total) by mouth daily. 30 tablet 0   acetaminophen  (TYLENOL) 325 MG tablet Take 650 mg by mouth every 6 (six) hours as needed for moderate pain or headache.     colchicine 0.6 MG tablet Take 0.6 mg by mouth daily as needed (for gout flare/pain).      Cyanocobalamin (B-12) 5000 MCG CAPS Take 5,000 mcg by mouth daily.      furosemide (LASIX) 20 MG tablet Take 20 mg by mouth daily as needed.      Insulin Pen Needle 32G X 4 MM MISC Use to inject insulin. 100 each 0   vitamin C (ASCORBIC ACID) 500 MG tablet Take 500 mg by mouth daily.      Blood pressure 97/61, pulse 74, temperature 98 F (36.7 C), temperature source Oral, resp. rate 18, height 6\' 4"  (1.93 m), weight (!) 149.1 kg, SpO2 100 %. Physical Exam: General: pleasant, WD, male who is in chair and in NAD HEENT: head is normocephalic, atraumatic.  Sclera are anicteric.  Pupils equal and round. EOMs intact.  Ears and nose without any masses or lesions.  Mouth is pink and moist Heart: regular, rate, and rhythm. Peripheral vascular disease to BLE evident  Lungs: CTAB, no wheezes, rhonchi, or rales noted.  Respiratory effort nonlabored Abd: soft, ND, +BS, no masses, hernias, or organomegaly focal TTP in RUQ and epigastric abdomen with positive Murphy sign. No peritonitis MSK: all 4 extremities are symmetrical with no cyanosis, clubbing, or edema. Skin: warm and dry with no masses, lesions, or rashes Neuro: Cranial nerves 2-12 grossly intact, sensation is normal throughout Psych: A&Ox3 with an appropriate affect.    Results for orders placed or performed during the hospital encounter of 10/16/22 (from the past 48 hour(s))  Glucose, capillary     Status: Abnormal   Collection Time: 10/17/22 11:18 AM  Result Value Ref Range   Glucose-Capillary 134 (H) 70 - 99 mg/dL    Comment: Glucose reference range applies only to samples taken after fasting for at least 8 hours.  Glucose, capillary     Status: Abnormal   Collection Time: 10/17/22  4:17 PM  Result Value Ref Range   Glucose-Capillary 173  (H) 70 - 99 mg/dL    Comment: Glucose reference range applies only to samples taken after fasting for at least 8 hours.  Glucose, capillary     Status: Abnormal   Collection Time: 10/18/22 12:10 AM  Result Value Ref Range   Glucose-Capillary 131 (H) 70 - 99 mg/dL    Comment: Glucose reference range applies only to samples taken after fasting for at least 8 hours.   Comment 1 Notify RN    Comment 2 Document in Chart   Glucose, capillary  Status: Abnormal   Collection Time: 10/18/22  6:14 AM  Result Value Ref Range   Glucose-Capillary 140 (H) 70 - 99 mg/dL    Comment: Glucose reference range applies only to samples taken after fasting for at least 8 hours.   Comment 1 Notify RN    Comment 2 Document in Chart   Comprehensive metabolic panel     Status: Abnormal   Collection Time: 10/18/22  8:25 AM  Result Value Ref Range   Sodium 138 135 - 145 mmol/L   Potassium 4.0 3.5 - 5.1 mmol/L   Chloride 108 98 - 111 mmol/L   CO2 19 (L) 22 - 32 mmol/L   Glucose, Bld 198 (H) 70 - 99 mg/dL    Comment: Glucose reference range applies only to samples taken after fasting for at least 8 hours.   BUN 31 (H) 8 - 23 mg/dL   Creatinine, Ser 1.61 (H) 0.61 - 1.24 mg/dL   Calcium 8.7 (L) 8.9 - 10.3 mg/dL   Total Protein 6.1 (L) 6.5 - 8.1 g/dL   Albumin 3.2 (L) 3.5 - 5.0 g/dL   AST 19 15 - 41 U/L   ALT 19 0 - 44 U/L   Alkaline Phosphatase 56 38 - 126 U/L   Total Bilirubin 0.8 0.3 - 1.2 mg/dL   GFR, Estimated 46 (L) >60 mL/min    Comment: (NOTE) Calculated using the CKD-EPI Creatinine Equation (2021)    Anion gap 11 5 - 15    Comment: Performed at [facility], [address], [place], [place] [address]  Lipase, blood     Status: None   Collection Time: 10/18/22  8:25 AM  Result Value Ref Range   Lipase 48 11 - 51 U/L    Comment: Performed at [facility], [address], [place], [place] [address]  Glucose, capillary     Status: Abnormal   Collection Time: 10/18/22 11:38 AM  Result  Value Ref Range   Glucose-Capillary 150 (H) 70 - 99 mg/dL    Comment: Glucose reference range applies only to samples taken after fasting for at least 8 hours.  Glucose, capillary     Status: Abnormal   Collection Time: 10/18/22  5:15 PM  Result Value Ref Range   Glucose-Capillary 124 (H) 70 - 99 mg/dL    Comment: Glucose reference range applies only to samples taken after fasting for at least 8 hours.  Glucose, capillary     Status: Abnormal   Collection Time: 10/19/22 12:05 AM  Result Value Ref Range   Glucose-Capillary 167 (H) 70 - 99 mg/dL    Comment: Glucose reference range applies only to samples taken after fasting for at least 8 hours.   Comment 1 Notify RN    Comment 2 Document in Chart   CBC     Status: Abnormal   Collection Time: 10/19/22 12:57 AM  Result Value Ref Range   WBC 10.1 4.0 - 10.5 K/uL   RBC 4.25 4.22 - 5.81 MIL/uL   Hemoglobin 12.8 (L) 13.0 - 17.0 g/dL   HCT 11.9 (L) 14.7 - 82.9 %   MCV 89.6 80.0 - 100.0 fL   MCH 30.1 26.0 - 34.0 pg   MCHC 33.6 30.0 - 36.0 g/dL   RDW 56.2 13.0 - 86.5 %   Platelets 212 150 - 400 K/uL   nRBC 0.0 0.0 - 0.2 %    Comment: Performed at [facility], [address] [place], [place] [address]  Basic metabolic panel     Status: Abnormal   Collection Time: 10/19/22 12:57 AM  Result Value Ref Range   Sodium 138 135 - 145 mmol/L   Potassium 4.0 3.5 - 5.1 mmol/L   Chloride 106 98 - 111 mmol/L   CO2 23 22 - 32 mmol/L   Glucose, Bld 168 (H) 70 - 99 mg/dL    Comment: Glucose reference range applies only to samples taken after fasting for at least 8 hours.   BUN 32 (H) 8 - 23 mg/dL   Creatinine, Ser 8.11 (H) 0.61 - 1.24 mg/dL   Calcium 8.8 (L) 8.9 - 10.3 mg/dL   GFR, Estimated 50 (L) >60 mL/min    Comment: (NOTE) Calculated using the CKD-EPI Creatinine Equation (2021)    Anion gap 9 5 - 15    Comment: Performed at [facility], [address] [place], [place] [address]  Glucose, capillary     Status: Abnormal    Collection Time: 10/19/22  5:50 AM  Result Value Ref Range   Glucose-Capillary 123 (H) 70 - 99 mg/dL    Comment: Glucose reference range applies only to samples taken after fasting for at least 8 hours.   Comment 1 Notify RN    Comment 2 Document in Chart    US Abdomen Limited RUQ (LIVER/GB)  Result Date: 10/17/2022 CLINICAL DATA:  Right upper quadrant pain EXAM: ULTRASOUND ABDOMEN LIMITED RIGHT UPPER QUADRANT COMPARISON:  CT angiogram chest 10/16/2022 FINDINGS: Gallbladder: Mildly distended gallbladder with multiple stones. No adjacent fluid. Borderline wall thickening at 3 mm Common bile duct: Diameter: 6 mm Liver: Diffusely echogenic hepatic parenchyma consistent with fatty liver infiltration. Portal vein is patent on color Doppler imaging with normal direction of blood flow towards the liver. Other: None. IMPRESSION: Distended gallbladder with stones. No adjacent fluid. Borderline wall thickness of 3 mm. If there is further concern of acute cholecystitis, HIDA scan could be considered as clinically appropriate No ductal dilatation. Electronically Signed   By: Karen Kays M.D.   On: 10/17/2022 18:38   ECHOCARDIOGRAM COMPLETE  Result Date: 10/17/2022    ECHOCARDIOGRAM REPORT   Patient Name:   Frank Shelton. Date of Exam: 10/17/2022 Medical Rec #:  [id number]          Height:       76.0 in Accession #:    [id number]         Weight:       326.9 lb Date of Birth:  [birth year]          BSA:          2.730 m Patient Age:    61 years           BP:           101/86 mmHg Patient Gender: M                  HR:           75 bpm. Exam Location:  Inpatient Procedure: 2D Echo, Cardiac Doppler and Color Doppler Indications:    Chest Pain  History:        Patient has prior history of Echocardiogram examinations, most                 recent 05/18/2022. CHF, Previous Myocardial Infarction and CAD,                 Arrythmias:Cardiac Arrest, Signs/Symptoms:Chest Pain; Risk  Factors:Diabetes and Hypertension.   Sonographer:    Darlys Gales Referring Phys: [id number] Angie Fava  Sonographer Comments: Technically difficult study due to poor echo windows. Image acquisition challenging due to respiratory motion. IMPRESSIONS  1. Technically difficult study with very limited visualization of cardiac structures.  2. Left ventricular ejection fraction, by estimation, is 55 to 60%. The left ventricle has normal function. The left ventricle has no regional wall motion abnormalities. Left ventricular diastolic parameters were normal.  3. Right ventricular systolic function is normal. The right ventricular size is normal.  4. The mitral valve was not well visualized. No evidence of mitral valve regurgitation. No evidence of mitral stenosis.  5. The aortic valve is normal in structure. There is mild calcification of the aortic valve. Aortic valve regurgitation is not visualized. No aortic stenosis is present. FINDINGS  Left Ventricle: Left ventricular ejection fraction, by estimation, is 55 to 60%. The left ventricle has normal function. The left ventricle has no regional wall motion abnormalities. The left ventricular internal cavity size was normal in size. There is  no left ventricular hypertrophy. Left ventricular diastolic parameters were normal. Right Ventricle: The right ventricular size is normal. No increase in right ventricular wall thickness. Right ventricular systolic function is normal. Left Atrium: Left atrial size was normal in size. Right Atrium: Right atrial size was normal in size. Pericardium: There is no evidence of pericardial effusion. Mitral Valve: The mitral valve was not well visualized. No evidence of mitral valve regurgitation. No evidence of mitral valve stenosis. Tricuspid Valve: The tricuspid valve is not well visualized. Tricuspid valve regurgitation is not demonstrated. No evidence of tricuspid stenosis. Aortic Valve: The aortic valve is normal in structure. There is mild calcification of the aortic  valve. Aortic valve regurgitation is not visualized. No aortic stenosis is present. Aortic valve mean gradient measures 3.0 mmHg. Aortic valve peak gradient measures 5.7 mmHg. Aortic valve area, by VTI measures 3.13 cm. Pulmonic Valve: The pulmonic valve was not assessed. Pulmonic valve regurgitation is not visualized. No evidence of pulmonic stenosis. Aorta: The aortic root is normal in size and structure. Venous: The inferior vena cava was not well visualized. IAS/Shunts: No atrial level shunt detected by color flow Doppler.  LEFT VENTRICLE PLAX 2D LVIDd:         4.30 cm   Diastology LVIDs:         2.70 cm   LV e' medial:    6.64 cm/s LV PW:         0.80 cm   LV E/e' medial:  9.6 LV IVS:        1.00 cm   LV e' lateral:   10.60 cm/s LVOT diam:     2.00 cm   LV E/e' lateral: 6.0 LV SV:         68 LV SV Index:   25 LVOT Area:     3.14 cm  RIGHT VENTRICLE RV S prime:     9.14 cm/s TAPSE (M-mode): 2.5 cm LEFT ATRIUM             Index        RIGHT ATRIUM           Index LA Vol (A2C):   83.5 ml 30.59 ml/m  RA Area:     14.60 cm LA Vol (A4C):   76.1 ml 27.88 ml/m  RA Volume:   32.90 ml  12.05 ml/m LA Biplane Vol: 81.6 ml 29.89 ml/m  AORTIC VALVE AV Area (  Vmax):    2.61 cm AV Area (Vmean):   2.79 cm AV Area (VTI):     3.13 cm AV Vmax:           119.00 cm/s AV Vmean:          83.100 cm/s AV VTI:            0.218 m AV Peak Grad:      5.7 mmHg AV Mean Grad:      3.0 mmHg LVOT Vmax:         98.80 cm/s LVOT Vmean:        73.800 cm/s LVOT VTI:          0.217 m LVOT/AV VTI ratio: 1.00 MITRAL VALVE MV Area (PHT): 2.40 cm    SHUNTS MV Decel Time: 316 msec    Systemic VTI:  0.22 m MV E velocity: 63.90 cm/s  Systemic Diam: 2.00 cm MV A velocity: 76.00 cm/s MV E/A ratio:  0.84 Aditya Sabharwal Electronically signed by Dorthula Nettles Signature Date/Time: 10/17/2022/11:07:03 AM    Final       Assessment/Plan Cholelithiasis RUQ abdominal pain  Patient seen and examined and relevant labs and imaging reviewed. He has  cholelithiasis. History consistent with symptomatic cholelithiasis. HIDA negative for cholecystitis. Given significant ttp and hx of worsening pain with increased frequency it does seem reasonable to consider cholecystectomy at this time. NPO after MN, continue to hold eliquis  FEN: HH diet ID: none VTE: please hold eliquis  Per primary chronic PE on eliquis - last dose 6/3 2100 h/o cardiac arrest CAD CHF CKD3b HTN  hypothyroidism s/p thyroidectomy by Dr. Gerrit Friends T2DM  I reviewed Consultant cardiology notes, hospitalist notes, last 24 h vitals and pain scores, last 48 h intake and output, last 24 h labs and trends, and last 24 h imaging results.   Trixie Deis, [facility] 10/19/2022, 8:45 AM Please see Amion for pager number during day hours 7:00am-4:30pm

## 2022-10-20 ENCOUNTER — Encounter (HOSPITAL_COMMUNITY)
Admission: EM | Disposition: A | Discharge status: Home / Self Care | Payer: Self-pay | Source: Home / Self Care | Attending: Emergency Medicine

## 2022-10-20 ENCOUNTER — Other Ambulatory Visit: Payer: Self-pay

## 2022-10-20 ENCOUNTER — Observation Stay (HOSPITAL_COMMUNITY): Payer: Medicare PPO | Admitting: Anesthesiology

## 2022-10-20 ENCOUNTER — Encounter (HOSPITAL_COMMUNITY): Payer: Self-pay | Admitting: Internal Medicine

## 2022-10-20 ENCOUNTER — Observation Stay (HOSPITAL_BASED_OUTPATIENT_CLINIC_OR_DEPARTMENT_OTHER): Payer: Medicare PPO | Admitting: Anesthesiology

## 2022-10-20 DIAGNOSIS — I10 Essential (primary) hypertension: Secondary | ICD-10-CM

## 2022-10-20 DIAGNOSIS — Z87891 Personal history of nicotine dependence: Secondary | ICD-10-CM

## 2022-10-20 DIAGNOSIS — Z794 Long term (current) use of insulin: Secondary | ICD-10-CM

## 2022-10-20 DIAGNOSIS — J449 Chronic obstructive pulmonary disease, unspecified: Secondary | ICD-10-CM | POA: Diagnosis not present

## 2022-10-20 DIAGNOSIS — E1151 Type 2 diabetes mellitus with diabetic peripheral angiopathy without gangrene: Secondary | ICD-10-CM

## 2022-10-20 DIAGNOSIS — I252 Old myocardial infarction: Secondary | ICD-10-CM | POA: Diagnosis not present

## 2022-10-20 DIAGNOSIS — R079 Chest pain, unspecified: Secondary | ICD-10-CM | POA: Diagnosis not present

## 2022-10-20 DIAGNOSIS — K8010 Calculus of gallbladder with chronic cholecystitis without obstruction: Secondary | ICD-10-CM | POA: Diagnosis not present

## 2022-10-20 DIAGNOSIS — Z7984 Long term (current) use of oral hypoglycemic drugs: Secondary | ICD-10-CM

## 2022-10-20 HISTORY — PX: CHOLECYSTECTOMY: SHX55

## 2022-10-20 LAB — GLUCOSE, CAPILLARY
Glucose-Capillary: 122 mg/dL — ABNORMAL HIGH (ref 70–99)
Glucose-Capillary: 130 mg/dL — ABNORMAL HIGH (ref 70–99)
Glucose-Capillary: 132 mg/dL — ABNORMAL HIGH (ref 70–99)
Glucose-Capillary: 133 mg/dL — ABNORMAL HIGH (ref 70–99)
Glucose-Capillary: 139 mg/dL — ABNORMAL HIGH (ref 70–99)
Glucose-Capillary: 208 mg/dL — ABNORMAL HIGH (ref 70–99)
Glucose-Capillary: 275 mg/dL — ABNORMAL HIGH (ref 70–99)

## 2022-10-20 SURGERY — LAPAROSCOPIC CHOLECYSTECTOMY
Anesthesia: General | Site: Abdomen

## 2022-10-20 MED ORDER — FENTANYL CITRATE (PF) 250 MCG/5ML IJ SOLN
INTRAMUSCULAR | Status: DC | PRN
  Administered 2022-10-20: 50 ug via INTRAVENOUS
  Administered 2022-10-20 (×2): 100 ug via INTRAVENOUS

## 2022-10-20 MED ORDER — ACETAMINOPHEN 10 MG/ML IV SOLN
INTRAVENOUS | Status: DC | PRN
  Administered 2022-10-20: 1000 mg via INTRAVENOUS

## 2022-10-20 MED ORDER — OXYCODONE HCL 5 MG PO TABS: 5.0000 mg | ORAL_TABLET | ORAL | Status: DC | PRN

## 2022-10-20 MED ORDER — CHLORHEXIDINE GLUCONATE 0.12 % MT SOLN: 15.0000 mL | Freq: Once | OROMUCOSAL | Status: AC

## 2022-10-20 MED ORDER — FENTANYL CITRATE (PF) 250 MCG/5ML IJ SOLN
INTRAMUSCULAR | Status: AC
  Filled 2022-10-20: qty 5

## 2022-10-20 MED ORDER — ONDANSETRON HCL 4 MG/2ML IJ SOLN: 4.0000 mg | Freq: Four times a day (QID) | INTRAMUSCULAR | Status: DC | PRN

## 2022-10-20 MED ORDER — HYDROMORPHONE HCL 1 MG/ML IJ SOLN
INTRAMUSCULAR | Status: AC
  Filled 2022-10-20: qty 1

## 2022-10-20 MED ORDER — LACTATED RINGERS IV SOLN: INTRAVENOUS | Status: DC

## 2022-10-20 MED ORDER — PHENYLEPHRINE HCL-NACL 20-0.9 MG/250ML-% IV SOLN
INTRAVENOUS | Status: AC
  Filled 2022-10-20: qty 250

## 2022-10-20 MED ORDER — ONDANSETRON HCL 4 MG/2ML IJ SOLN
INTRAMUSCULAR | Status: DC | PRN
  Administered 2022-10-20: 4 mg via INTRAVENOUS

## 2022-10-20 MED ORDER — PHENYLEPHRINE HCL-NACL 20-0.9 MG/250ML-% IV SOLN
INTRAVENOUS | Status: DC | PRN
  Administered 2022-10-20: 25 ug/min via INTRAVENOUS

## 2022-10-20 MED ORDER — 0.9 % SODIUM CHLORIDE (POUR BTL) OPTIME
TOPICAL | Status: DC | PRN
  Administered 2022-10-20: 1000 mL

## 2022-10-20 MED ORDER — HYDROMORPHONE HCL 1 MG/ML IJ SOLN: 0.5000 mg | INTRAMUSCULAR | Status: DC | PRN

## 2022-10-20 MED ORDER — EPHEDRINE SULFATE (PRESSORS) 50 MG/ML IJ SOLN
INTRAMUSCULAR | Status: DC | PRN
  Administered 2022-10-20: 10 mg via INTRAVENOUS

## 2022-10-20 MED ORDER — ORAL CARE MOUTH RINSE: 15.0000 mL | Freq: Once | OROMUCOSAL | Status: AC

## 2022-10-20 MED ORDER — SUCCINYLCHOLINE CHLORIDE 200 MG/10ML IV SOSY
PREFILLED_SYRINGE | INTRAVENOUS | Status: DC | PRN
  Administered 2022-10-20: 200 mg via INTRAVENOUS

## 2022-10-20 MED ORDER — METHOCARBAMOL 1000 MG/10ML IJ SOLN
500.0000 mg | Freq: Four times a day (QID) | INTRAVENOUS | Status: DC | PRN
  Filled 2022-10-20: qty 5

## 2022-10-20 MED ORDER — HYDROMORPHONE HCL 1 MG/ML IJ SOLN
0.2500 mg | INTRAMUSCULAR | Status: DC | PRN
  Administered 2022-10-20 (×3): 0.5 mg via INTRAVENOUS

## 2022-10-20 MED ORDER — DEXTROSE 5 % IV SOLN
INTRAVENOUS | Status: DC | PRN
  Administered 2022-10-20: 3 g via INTRAVENOUS

## 2022-10-20 MED ORDER — INSULIN ASPART 100 UNIT/ML IJ SOLN: 0.0000 [IU] | INTRAMUSCULAR | Status: DC | PRN

## 2022-10-20 MED ORDER — SIMETHICONE 80 MG PO CHEW: 80.0000 mg | CHEWABLE_TABLET | Freq: Four times a day (QID) | ORAL | Status: DC | PRN

## 2022-10-20 MED ORDER — PROPOFOL 10 MG/ML IV BOLUS
INTRAVENOUS | Status: AC
  Filled 2022-10-20: qty 20

## 2022-10-20 MED ORDER — KETOROLAC TROMETHAMINE 15 MG/ML IJ SOLN
15.0000 mg | Freq: Three times a day (TID) | INTRAMUSCULAR | Status: DC
  Administered 2022-10-20 – 2022-10-21 (×2): 15 mg via INTRAVENOUS
  Filled 2022-10-20 (×4): qty 1

## 2022-10-20 MED ORDER — LIDOCAINE 2% (20 MG/ML) 5 ML SYRINGE
INTRAMUSCULAR | Status: DC | PRN
  Administered 2022-10-20: 40 mg via INTRAVENOUS

## 2022-10-20 MED ORDER — SODIUM CHLORIDE 0.9 % IR SOLN
Status: DC | PRN
  Administered 2022-10-20: 1000 mL

## 2022-10-20 MED ORDER — DOCUSATE SODIUM 100 MG PO CAPS
100.0000 mg | ORAL_CAPSULE | Freq: Two times a day (BID) | ORAL | Status: DC
  Administered 2022-10-20 – 2022-10-21 (×2): 100 mg via ORAL
  Filled 2022-10-20 (×2): qty 1

## 2022-10-20 MED ORDER — MEPERIDINE HCL 25 MG/ML IJ SOLN: 6.2500 mg | INTRAMUSCULAR | Status: DC | PRN

## 2022-10-20 MED ORDER — PROCHLORPERAZINE EDISYLATE 10 MG/2ML IJ SOLN: 10.0000 mg | INTRAMUSCULAR | Status: DC | PRN

## 2022-10-20 MED ORDER — BUPIVACAINE HCL 0.25 % IJ SOLN
INTRAMUSCULAR | Status: DC | PRN
  Administered 2022-10-20: 30 mL

## 2022-10-20 MED ORDER — HEMOSTATIC AGENTS (NO CHARGE) OPTIME
TOPICAL | Status: DC | PRN
  Administered 2022-10-20: 1 via TOPICAL

## 2022-10-20 MED ORDER — OXYCODONE HCL 5 MG PO TABS: 10.0000 mg | ORAL_TABLET | ORAL | Status: DC | PRN

## 2022-10-20 MED ORDER — ACETAMINOPHEN 10 MG/ML IV SOLN
INTRAVENOUS | Status: AC
  Filled 2022-10-20: qty 100

## 2022-10-20 MED ORDER — MIDAZOLAM HCL 2 MG/2ML IJ SOLN: 0.5000 mg | Freq: Once | INTRAMUSCULAR | Status: DC | PRN

## 2022-10-20 MED ORDER — PROPOFOL 10 MG/ML IV BOLUS
INTRAVENOUS | Status: DC | PRN
  Administered 2022-10-20: 150 mg via INTRAVENOUS

## 2022-10-20 MED ORDER — CEFAZOLIN SODIUM 1 G IJ SOLR
INTRAMUSCULAR | Status: AC
  Filled 2022-10-20: qty 10

## 2022-10-20 MED ORDER — MIDAZOLAM HCL 2 MG/2ML IJ SOLN
INTRAMUSCULAR | Status: AC
  Filled 2022-10-20: qty 2

## 2022-10-20 MED ORDER — GABAPENTIN 300 MG PO CAPS: 300.0000 mg | ORAL_CAPSULE | Freq: Three times a day (TID) | ORAL | Status: DC

## 2022-10-20 MED ORDER — BUPIVACAINE HCL (PF) 0.25 % IJ SOLN
INTRAMUSCULAR | Status: AC
  Filled 2022-10-20: qty 30

## 2022-10-20 MED ORDER — MIDAZOLAM HCL 2 MG/2ML IJ SOLN
INTRAMUSCULAR | Status: DC | PRN
  Administered 2022-10-20: 2 mg via INTRAVENOUS

## 2022-10-20 MED ORDER — OXYCODONE HCL 5 MG PO TABS: 5.0000 mg | ORAL_TABLET | Freq: Once | ORAL | Status: DC | PRN

## 2022-10-20 MED ORDER — SUGAMMADEX SODIUM 200 MG/2ML IV SOLN
INTRAVENOUS | Status: DC | PRN
  Administered 2022-10-20: 200 mg via INTRAVENOUS
  Administered 2022-10-20: 100 mg via INTRAVENOUS

## 2022-10-20 MED ORDER — PROMETHAZINE HCL 25 MG/ML IJ SOLN: 6.2500 mg | INTRAMUSCULAR | Status: DC | PRN

## 2022-10-20 MED ORDER — DEXAMETHASONE SODIUM PHOSPHATE 10 MG/ML IJ SOLN
INTRAMUSCULAR | Status: DC | PRN
  Administered 2022-10-20: 5 mg via INTRAVENOUS

## 2022-10-20 MED ORDER — OXYCODONE HCL 5 MG/5ML PO SOLN: 5.0000 mg | Freq: Once | ORAL | Status: DC | PRN

## 2022-10-20 MED ORDER — CHLORHEXIDINE GLUCONATE 0.12 % MT SOLN
OROMUCOSAL | Status: AC
  Administered 2022-10-20: 15 mL via OROMUCOSAL
  Filled 2022-10-20: qty 15

## 2022-10-20 MED ORDER — ROCURONIUM BROMIDE 10 MG/ML (PF) SYRINGE
PREFILLED_SYRINGE | INTRAVENOUS | Status: DC | PRN
  Administered 2022-10-20: 40 mg via INTRAVENOUS

## 2022-10-20 MED ORDER — ACETAMINOPHEN 325 MG PO TABS
650.0000 mg | ORAL_TABLET | Freq: Four times a day (QID) | ORAL | Status: DC
  Administered 2022-10-20 – 2022-10-21 (×3): 650 mg via ORAL
  Filled 2022-10-20 (×3): qty 2

## 2022-10-20 SURGICAL SUPPLY — 41 items
APPLIER CLIP ROT 10 11.4 M/L (STAPLE) ×1
BAG COUNTER SPONGE SURGICOUNT (BAG) ×1 IMPLANT
CANISTER SUCT 3000ML PPV (MISCELLANEOUS) ×1 IMPLANT
CHLORAPREP W/TINT 26 (MISCELLANEOUS) ×1 IMPLANT
CLIP APPLIE ROT 10 11.4 M/L (STAPLE) ×1 IMPLANT
COVER SURGICAL LIGHT HANDLE (MISCELLANEOUS) ×1 IMPLANT
DERMABOND ADVANCED .7 DNX12 (GAUZE/BANDAGES/DRESSINGS) ×1 IMPLANT
ELECT REM PT RETURN 9FT ADLT (ELECTROSURGICAL) ×1
ELECTRODE REM PT RTRN 9FT ADLT (ELECTROSURGICAL) ×1 IMPLANT
ENDOLOOP SUT PDS II 0 18 (SUTURE) IMPLANT
GLOVE BIO SURGEON STRL SZ7.5 (GLOVE) ×1 IMPLANT
GLOVE BIOGEL PI IND STRL 8 (GLOVE) ×1 IMPLANT
GOWN STRL REUS W/ TWL LRG LVL3 (GOWN DISPOSABLE) ×2 IMPLANT
GOWN STRL REUS W/ TWL XL LVL3 (GOWN DISPOSABLE) ×1 IMPLANT
GOWN STRL REUS W/TWL LRG LVL3 (GOWN DISPOSABLE) ×2
GOWN STRL REUS W/TWL XL LVL3 (GOWN DISPOSABLE) ×1
GRASPER SUT TROCAR 14GX15 (MISCELLANEOUS) ×1 IMPLANT
HEMOSTAT SNOW SURGICEL 2X4 (HEMOSTASIS) IMPLANT
IRRIG SUCT STRYKERFLOW 2 WTIP (MISCELLANEOUS) ×1
IRRIGATION SUCT STRKRFLW 2 WTP (MISCELLANEOUS) ×1 IMPLANT
KIT BASIN OR (CUSTOM PROCEDURE TRAY) ×1 IMPLANT
KIT IMAGING PINPOINTPAQ (MISCELLANEOUS) IMPLANT
KIT TURNOVER KIT B (KITS) ×1 IMPLANT
NDL 22X1.5 STRL (OR ONLY) (MISCELLANEOUS) ×1 IMPLANT
NDL INSUFFLATION 14GA 120MM (NEEDLE) ×1 IMPLANT
NEEDLE 22X1.5 STRL (OR ONLY) (MISCELLANEOUS) ×1 IMPLANT
NEEDLE INSUFFLATION 14GA 120MM (NEEDLE) ×1 IMPLANT
NS IRRIG 1000ML POUR BTL (IV SOLUTION) ×1 IMPLANT
PAD ARMBOARD 7.5X6 YLW CONV (MISCELLANEOUS) ×1 IMPLANT
POUCH RETRIEVAL ECOSAC 10 (ENDOMECHANICALS) ×1 IMPLANT
SCISSORS LAP 5X35 DISP (ENDOMECHANICALS) ×1 IMPLANT
SET TUBE SMOKE EVAC HIGH FLOW (TUBING) ×1 IMPLANT
SLEEVE Z-THREAD 5X100MM (TROCAR) ×2 IMPLANT
SUT MNCRL AB 4-0 PS2 18 (SUTURE) ×1 IMPLANT
TOWEL GREEN STERILE (TOWEL DISPOSABLE) ×1 IMPLANT
TOWEL GREEN STERILE FF (TOWEL DISPOSABLE) ×1 IMPLANT
TRAY LAPAROSCOPIC MC (CUSTOM PROCEDURE TRAY) ×1 IMPLANT
TROCAR 11X100 Z THREAD (TROCAR) ×1 IMPLANT
TROCAR Z-THREAD OPTICAL 5X100M (TROCAR) ×1 IMPLANT
WARMER LAPAROSCOPE (MISCELLANEOUS) ×1 IMPLANT
WATER STERILE IRR 1000ML POUR (IV SOLUTION) ×1 IMPLANT

## 2022-10-20 NOTE — Anesthesia Procedure Notes (Signed)
Procedure Name: Intubation Date/Time: 10/20/2022 2:55 PM  Performed by: Randon Goldsmith, CRNAPre-anesthesia Checklist: Patient identified, Emergency Drugs available, Suction available and Patient being monitored Patient Re-evaluated:Patient Re-evaluated prior to induction Oxygen Delivery Method: Circle system utilized Preoxygenation: Pre-oxygenation with 100% oxygen Induction Type: IV induction Ventilation: Mask ventilation without difficulty Laryngoscope Size: Glidescope Grade View: Grade I Tube type: Oral Tube size: 7.0 mm Number of attempts: 1 Airway Equipment and Method: Oral airway and Rigid stylet Placement Confirmation: ETT inserted through vocal cords under direct vision, positive ETCO2 and breath sounds checked- equal and bilateral Secured at: 23 cm Tube secured with: Tape Dental Injury: Teeth and Oropharynx as per pre-operative assessment

## 2022-10-20 NOTE — Anesthesia Postprocedure Evaluation (Signed)
Anesthesia Post Note  Patient: Frank Shelton.  Procedure(s) Performed: LAPAROSCOPIC CHOLECYSTECTOMY (Abdomen)     Patient location during evaluation: PACU Anesthesia Type: General Level of consciousness: awake and alert, patient cooperative and oriented Pain management: pain level controlled Vital Signs Assessment: post-procedure vital signs reviewed and stable Respiratory status: spontaneous breathing, nonlabored ventilation, respiratory function stable and patient connected to nasal cannula oxygen Cardiovascular status: blood pressure returned to baseline and stable Postop Assessment: no apparent nausea or vomiting Anesthetic complications: no   No notable events documented.  Last Vitals:  Vitals:   10/20/22 1630 10/20/22 1640  BP: 133/70 128/65  Pulse: 66 70  Resp: 10 14  Temp:  36.6 C  SpO2: 98% 95%    Last Pain:  Vitals:   10/20/22 1640  TempSrc:   PainSc: 0-No pain                 Asim Gersten,E. Hadlei Stitt

## 2022-10-20 NOTE — Progress Notes (Signed)
Progress Note: General Surgery Service   Chief Complaint/Subjective: Anticipating surgery  Objective: Vital signs in last 24 hours: Temp:  [97.5 F (36.4 C)-98 F (36.7 C)] 97.9 F (36.6 C) (06/06 1325) Pulse Rate:  [64-76] 64 (06/06 1325) Resp:  [18] 18 (06/06 1325) BP: (102-116)/(58-74) 116/74 (06/06 1325) SpO2:  [97 %-100 %] 97 % (06/06 1325) Weight:  [150 kg] 150 kg (06/06 1325) Last BM Date : 10/19/22  Intake/Output from previous day: 06/05 0701 - 06/06 0700 In: 240 [P.O.:240] Out: 1175 [Urine:1175] Intake/Output this shift: Total I/O In: -  Out: 475 [Urine:475]  Constitutional: NAD; conversant; no deformities Eyes: Moist conjunctiva; no lid lag; anicteric; PERRL Neck: Trachea midline; no thyromegaly Lungs: Normal respiratory effort; no tactile fremitus CV: RRR; no palpable thrills; no pitting edema GI: Abd RUQ tenderness; no palpable hepatosplenomegaly MSK: Normal range of motion of extremities; no clubbing/cyanosis Psychiatric: Appropriate affect; alert and oriented x3 Lymphatic: No palpable cervical or axillary lymphadenopathy  Lab Results: CBC  Recent Labs    10/19/22 0057  WBC 10.1  HGB 12.8*  HCT 38.1*  PLT 212   BMET Recent Labs    10/18/22 0825 10/19/22 0057  NA 138 138  K 4.0 4.0  CL 108 106  CO2 19* 23  GLUCOSE 198* 168*  BUN 31* 32*  CREATININE 1.67* 1.54*  CALCIUM 8.7* 8.8*   PT/INR No results for input(s): "LABPROT", "INR" in the last 72 hours. ABG No results for input(s): "PHART", "HCO3" in the last 72 hours.  Invalid input(s): "PCO2", "PO2"  Anti-infectives: Anti-infectives (From admission, onward)    Start     Dose/Rate Route Frequency Ordered Stop   10/20/22 0800  ceFAZolin (ANCEF) IVPB 2g/100 mL premix        2 g 200 mL/hr over 30 Minutes Intravenous  Once 10/19/22 1559         Medications: Scheduled Meds:  [MAR Hold] atorvastatin  40 mg Oral Daily   [MAR Hold] insulin aspart  0-9 Units Subcutaneous Q6H   [MAR  Hold] levothyroxine  250 mcg Oral Q0600   [MAR Hold] metoprolol tartrate  25 mg Oral BID   [MAR Hold] mupirocin ointment  1 Application Nasal BID   pneumococcal 23 valent vaccine  0.5 mL Intramuscular Tomorrow-1000   Continuous Infusions:   ceFAZolin (ANCEF) IV Stopped (10/20/22 1038)   lactated ringers 10 mL/hr at 10/20/22 1427   PRN Meds:.[MAR Hold] acetaminophen **OR** [MAR Hold] acetaminophen, insulin aspart, [MAR Hold] melatonin, [MAR Hold] ondansetron (ZOFRAN) IV  Assessment/Plan:  Mr. Beaulieu has gallstones and symptoms of biliary colic consistent with symptomatic cholelithiasis. This began after a prolonged hospitalization related to pulmonary embolism. The medical team has ruled out other sources of his pain, his blood thinner has been held, and surgery was asked to proceed with cholecystectomy while inpatient for symptomatic cholelithiasis despite negative HIDA scan. I recommended laparoscopic cholecystectomy. We discussed the procedure, its risks, benefits and alteratives and the patient granted consent to proceed.    LOS: 0 days     Quentin Ore, MD  [facility] Use [email or web address] to contact on call provider

## 2022-10-20 NOTE — Anesthesia Preprocedure Evaluation (Signed)
Anesthesia Evaluation    Airway Mallampati: III  TM Distance: >3 FB Neck ROM: Full    Dental  (+) Dental Advisory Given   Pulmonary sleep apnea (no longer requires CPAP) , COPD,  COPD inhaler, former smoker, PE S/p PE in setting pf COVID 04/2022, has [place] filter   breath sounds clear to auscultation       Cardiovascular hypertension, + Past MI and + Peripheral Vascular Disease   Rhythm:Regular Rate:Normal  Echocardiogram 05/17/2020 IMPRESSIONS   1. Technically difficult; no parasternal views; LV function appears to be  normal.   2. Left ventricular ejection fraction, by estimation, is 70 to 75%. The  left ventricle has hyperdynamic function. The left ventricle has no  regional wall motion abnormalities. Left ventricular diastolic parameters  are consistent with Grade I diastolic  dysfunction (impaired relaxation).   3. Right ventricular systolic function is normal. The right ventricular  size is normal.   4. The mitral valve is normal in structure. No evidence of mitral valve  regurgitation. No evidence of mitral stenosis.   5. The aortic valve was not well visualized. Aortic valve regurgitation  is not visualized. No aortic stenosis is present  05/2022 Stress: Lexiscan stress test was negative for ischemia and considered a low risk study per interpreting physician   Neuro/Psych  Headaches    GI/Hepatic Neg liver ROS,GERD  Controlled,,  Endo/Other  diabetes (glu 132), Insulin Dependent, Oral Hypoglycemic AgentsHypothyroidism  BMI 40  Renal/GU Renal InsufficiencyRenal disease     Musculoskeletal   Abdominal   Peds  Hematology Off of Eliquis x 2d   Anesthesia Other Findings   Reproductive/Obstetrics                             Anesthesia Physical Anesthesia Plan  ASA: 3  Anesthesia Plan: General   Post-op Pain Management: Ofirmev IV (intra-op)*   Induction: Intravenous  PONV Risk  Score and Plan: 2 and Ondansetron and Dexamethasone  Airway Management Planned: Oral ETT and Video Laryngoscope Planned  Additional Equipment: None  Intra-op Plan:   Post-operative Plan: Extubation in OR  Informed Consent: I have reviewed the patients History and Physical, chart, labs and discussed the procedure including the risks, benefits and alternatives for the proposed anesthesia with the patient or authorized representative who has indicated his/her understanding and acceptance.     Dental advisory given  Plan Discussed with: CRNA and Surgeon  Anesthesia Plan Comments:        Anesthesia Quick Evaluation

## 2022-10-20 NOTE — Transfer of Care (Signed)
Immediate Anesthesia Transfer of Care Note  Patient: Frank Shelton.  Procedure(s) Performed: LAPAROSCOPIC CHOLECYSTECTOMY (Abdomen)  Patient Location: PACU  Anesthesia Type:General  Level of Consciousness: awake and oriented  Airway & Oxygen Therapy: Patient Spontanous Breathing and Patient connected to nasal cannula oxygen  Post-op Assessment: Report given to RN and Post -op Vital signs reviewed and stable  Post vital signs: Reviewed and stable  Last Vitals:  Vitals Value Taken Time  BP 140/73 10/20/22 1605  Temp    Pulse 79 10/20/22 1609  Resp 18 10/20/22 1609  SpO2 99 % 10/20/22 1609  Vitals shown include unvalidated device data.  Last Pain:  Vitals:   10/20/22 1329  TempSrc:   PainSc: 6          Complications: No notable events documented.

## 2022-10-20 NOTE — Op Note (Signed)
Patient: Frank Shelton. ([birth year], [id number])  Date of Surgery: 10/20/22  Preoperative Diagnosis: CHRONIC CHOLECYSTITIS   Postoperative Diagnosis: CHRONIC CHOLECYSTITIS   Surgical Procedure: LAPAROSCOPIC CHOLECYSTECTOMY:    Operative Team Members:  Surgeon(s) and Role:    * Frank Shelton, Frank Hopes, MD - Primary   Anesthesiologist: Frank Ben, MD CRNA: Frank Ren, CRNA   Anesthesia: General   Fluids:  Total I/O In: 800 [I.V.:800] Out: 485 [Urine:475; Blood:10]  Complications: None  Drains:  none   Specimen:  ID Type Source Tests Collected by Time Destination  1 : Gallbladder Tissue PATH Gallbladder SURGICAL PATHOLOGY Frank Shelton, Frank Hopes, MD 10/20/2022 1515      Disposition:  PACU - hemodynamically stable.  Plan of Care:  Continue inpatient care    Indications for Procedure: Frank Shelton. is a 62 y.o. male who presented with abdominal pain.  Frank Shelton has gallstones and symptoms of biliary colic consistent with symptomatic cholelithiasis. This began after a prolonged hospitalization related to pulmonary embolism. The medical team has ruled out other sources of his pain, his blood thinner has been held, and surgery was asked to proceed with cholecystectomy while inpatient for symptomatic cholelithiasis despite negative HIDA scan. I recommended laparoscopic cholecystectomy. We discussed the procedure, its risks, benefits and alteratives and the patient granted consent to proceed.    Findings: Large gallstones  Infection status: Patient: Frank Shelton Emergency General Surgery Service Patient Case: Urgent Infection Present At Time Of Surgery (PATOS):  Some spillage of bile   Description of Procedure:   On the date stated above, the patient was taken to the operating room suite and placed in supine positioning.  Sequential compression devices were placed on the lower extremities to prevent blood clots.  General endotracheal anesthesia was induced.  Preoperative antibiotics were given.  The patient's abdomen was prepped and draped in the usual sterile fashion.  A time-out was completed verifying the correct patient, procedure, positioning and equipment needed for the case.  We began by anesthetizing the skin with local anesthetic and then making a 5 mm incision just below the umbilicus.  We dissected through the subcutaneous tissues to the fascia.  The fascia was grasped and elevated using a Kocher clamp.  A Veress needle was inserted into the abdomen and the abdomen was insufflated to 15 mmHg.  A 5 mm trocar was inserted in this position under optical guidance and then the abdomen was inspected.  There was no trauma to the underlying viscera with initial trocar placement.  Any abnormal findings, other than inflammation in the right upper quadrant, are listed above in the findings section.  Three additional trocars were placed, one 12 mm trocar in the subxiphoid position, one 5 mm trocar in the midline epigastric area and one 5mm trocar in the right upper quadrant subcostally.  These were placed under direct vision without any trauma to the underlying viscera.    The patient was then placed in head up, left side down positioning.  The gallbladder was identified and dissected free from its attachments to the omentum allowing the duodenum to fall away.  The infundibulum of the gallbladder was dissected free working laterally to medially.  The cystic duct and cystic artery were dissected free from surrounding connective tissue.  The infundibulum of the gallbladder was dissected off the cystic plate.  A critical view of safety was obtained with the cystic duct and cystic artery being cleared of connective tissues and clearly the only two structures entering into the  gallbladder with the liver clearly visible behind.  Clips were then applied to the cystic duct and cystic artery and then these structures were divided.  A PDS endoloop was placed on cytic duct  stump. The gallbladder was dissected off the cystic plate, placed in an endocatch bag and removed from the 12 mm subxiphoid port site.  The clips were inspected and appeared effective.  The cystic plate was inspected and hemostasis was obtained using electrocautery.  A suction irrigator was used to clean the operative field.  Frank Shelton was placed in the gallbladder fossa.  Attention was turned to closure.  The 12 mm subxiphoid port site was closed using a 0-vicryl suture on a fascial suture passer.  The abdomen was desufflated.  The skin was closed using 4-0 monocryl and dermabond.  All sponge and needle counts were correct at the conclusion of the case.    Frank Drape, MD General, Bariatric, & Minimally Invasive Surgery [facility] [place]

## 2022-10-20 NOTE — Progress Notes (Signed)
PROGRESS NOTE  Frank Shelton.  ZOX:[id number] DOB: [birth year] DOA: 10/16/2022 PCP: Vanessa [place], FNP   Brief Narrative: Patient is a 62 year old male with history of chronic PE on Eliquis, cardiac arrest, type 2 diabetes, hypertension, hypothyroidism, chronic diastolic CHF who presented with complaint of chest pain, right upper quadrant abdominal pain.  On presentation, he was hemodynamically stable.  Lab work showed creatinine of 1.4.  EKG showed ST changes.  CTA chest did not show any acute PE.  Cardiology was initially consulted for chest pain but it has been ruled out.  Patient continues to complain of right upper quadrant discomfort.  Imagings have showed cholelithiasis,, distended gallbladder.  General surgery consulted ,plan for lap chole today  Assessment & Plan:  Principal Problem:   Chest pain Active Problems:   Essential hypertension   DM2 (diabetes mellitus, type 2) (HCC)   Acquired hypothyroidism   Chronic pulmonary embolism (HCC)   Chronic diastolic CHF (congestive heart failure) (HCC)  Chest pain/coronary artery disease: Presented with left-sided chest pressure associated with exertion but radiating from RUQ.  Troponins were not elevated.  EKG did not show any ischemic changes.  Cardiology was initially consulted.  Chest pain is atypical.  Echo did not show any significant change from January this year.  Patient was most likely associated  with gallbladder issue.  CT chest did not show any acute PE.  cardiology signed off.  Chest pain has resolved.  Right upper quadrant abdominal pain: He has been having right upper quadrant pain since last few months.  Continues to complain of pain on that area.  Liver enzymes normal.  He is afebrile, no leukocytosis.  Ultrasound of the gallbladder showed gallbladder distention, stones, wall thickening of the 3 millimeter.  HID negative.  Since he has persistent symptoms, general surgery planning for lap chole  History of diastolic CHF:  Currently appears euvolemic.  Echo done here showed EF of 55 to 60%, normal diastolic parameters  CKD stage IIIb: Baseline creatinine around 1.9.  Likely kidney function at baseline.  History of recurrent PE: CTA chest on this admission did not show any acute PE but showed a stable left lower lobe thrombus.  On Eliquis, currently held for upcoming surgery  Hypertension: Continue metoprolol  Hypothyroidism: Continue Synthyroid  Type 2 diabetes: Most recent A1c of 7.6.  Continue current insulin regimen.  Morbid obesity: BMI 39.7      DVT prophylaxis:SCDs Start: 10/17/22 0103     Code Status: Full Code  Family Communication: None at the bedside   Patient status: Inpatient  Patient is from : Home  Anticipated discharge to: Home  Estimated DC date: Tomorrow  Consultants: General surgery, cardiology  Procedures: None  Antimicrobials:  Anti-infectives (From admission, onward)    Start     Dose/Rate Route Frequency Ordered Stop   10/20/22 0800  ceFAZolin (ANCEF) IVPB 2g/100 mL premix        2 g 200 mL/hr over 30 Minutes Intravenous  Once 10/19/22 1559         Subjective: Patient seen and examined at bedside today.  Hemodynamically stable.  Sitting in the chair.  Still has some right upper quadrant discomfort.  Waiting for surgery today, no chest pain  Objective: Vitals:   10/19/22 1919 10/20/22 0011 10/20/22 0619 10/20/22 0719  BP: 116/67 104/67 108/72 103/68  Pulse: 71 65 68 76  Resp: 18 18 18 18   Temp: 97.9 F (36.6 C) (!) 97.5 F (36.4 C) (!) 97.5 F (36.4  C) 97.8 F (36.6 C)  TempSrc: Oral Oral Oral Oral  SpO2: 100% 100% 100% 100%  Weight:   (!) 150 kg   Height:        Intake/Output Summary (Last 24 hours) at 10/20/2022 1042 Last data filed at 10/20/2022 0011 Gross per 24 hour  Intake 240 ml  Output 1175 ml  Net -935 ml   Filed Weights   10/18/22 0358 10/19/22 0553 10/20/22 0619  Weight: (!) 148 kg (!) 149.1 kg (!) 150 kg     Examination:  General exam: Overall comfortable, not in distress,morbidly obese HEENT: PERRL Respiratory system:  no wheezes or crackles  Cardiovascular system: S1 & S2 heard, RRR.  Gastrointestinal system: Abdomen is nondistended, soft and nontender. Central nervous system: Alert and oriented Extremities: No edema, no clubbing ,no cyanosis Skin: No rashes, no ulcers,no icterus     Data Reviewed: I have personally reviewed following labs and imaging studies  CBC: Recent Labs  Lab 10/16/22 1725 10/17/22 0216 10/19/22 0057  WBC 9.5 10.5 10.1  NEUTROABS  --  5.4  --   HGB 16.0 14.6 12.8*  HCT 47.1 43.1 38.1*  MCV 90.9 90.5 89.6  PLT 229 236 212   Basic Metabolic Panel: Recent Labs  Lab 10/16/22 1725 10/17/22 0216 10/18/22 0825 10/19/22 0057  NA 139 138 138 138  K 3.8 3.8 4.0 4.0  CL 104 105 108 106  CO2 23 21* 19* 23  GLUCOSE 220* 119* 198* 168*  BUN 33* 31* 31* 32*  CREATININE 1.46* 1.53* 1.67* 1.54*  CALCIUM 9.7 9.4 8.7* 8.8*  MG  --  2.2  --   --      Recent Results (from the past 240 hour(s))  Surgical PCR screen     Status: None   Collection Time: 10/19/22  9:49 PM   Specimen: Nasal Mucosa; Nasal Swab  Result Value Ref Range Status   MRSA, PCR NEGATIVE NEGATIVE Final   Staphylococcus aureus NEGATIVE NEGATIVE Final    Comment: (NOTE) The Xpert SA Assay (FDA approved for NASAL specimens in patients 73 years of age and older), is one component of a comprehensive surveillance program. It is not intended to diagnose infection nor to guide or monitor treatment. Performed at [facility], [address] [place], [place] [address]      Radiology Studies: NM Hepato W/EF  Result Date: 10/19/2022 CLINICAL DATA:  RIGHT upper quadrant discomfort. Distended gallbladder with stones identified on RIGHT upper quadrant ultrasound of 10/17/2022. EXAM: NUCLEAR MEDICINE HEPATOBILIARY IMAGING WITH GALLBLADDER EF TECHNIQUE: Sequential images of the abdomen  were obtained out to 60 minutes following intravenous administration of radiopharmaceutical. After oral ingestion of Ensure, gallbladder ejection fraction was determined. At 60 min, normal ejection fraction is greater than 33%. RADIOPHARMACEUTICALS:  5.3 mCi Tc-51m  Choletec IV COMPARISON:  None Available. FINDINGS: Prompt uptake and biliary excretion of activity by the liver is seen. Gallbladder activity is visualized, consistent with patency of cystic duct. Biliary activity passes into small bowel, consistent with patent common bile duct. Calculated gallbladder ejection fraction is 90%. (Normal gallbladder ejection fraction with Ensure is greater than 33%.) IMPRESSION: Normal HIDA scan. No evidence of acute cholecystitis. Normal gallbladder ejection fraction. Electronically Signed   By: Bary Richard M.D.   On: 10/19/2022 11:04    Scheduled Meds:  atorvastatin  40 mg Oral Daily   insulin aspart  0-9 Units Subcutaneous Q6H   levothyroxine  250 mcg Oral Q0600   metoprolol tartrate  25  mg Oral BID   mupirocin ointment  1 Application Nasal BID   pneumococcal 23 valent vaccine  0.5 mL Intramuscular Tomorrow-1000   Continuous Infusions:   ceFAZolin (ANCEF) IV Stopped (10/20/22 1038)     LOS: 0 days   Burnadette Pop, MD [facility] P6/10/2022, 10:42 AM

## 2022-10-21 ENCOUNTER — Encounter (HOSPITAL_COMMUNITY): Payer: Self-pay | Admitting: Surgery

## 2022-10-21 ENCOUNTER — Other Ambulatory Visit (HOSPITAL_COMMUNITY): Payer: Self-pay

## 2022-10-21 DIAGNOSIS — R079 Chest pain, unspecified: Secondary | ICD-10-CM | POA: Diagnosis not present

## 2022-10-21 LAB — HEPATIC FUNCTION PANEL
ALT: 38 U/L (ref 0–44)
AST: 48 U/L — ABNORMAL HIGH (ref 15–41)
Albumin: 3.1 g/dL — ABNORMAL LOW (ref 3.5–5.0)
Alkaline Phosphatase: 59 U/L (ref 38–126)
Bilirubin, Direct: 0.2 mg/dL (ref 0.0–0.2)
Indirect Bilirubin: 0.4 mg/dL (ref 0.3–0.9)
Total Bilirubin: 0.6 mg/dL (ref 0.3–1.2)
Total Protein: 6.4 g/dL — ABNORMAL LOW (ref 6.5–8.1)

## 2022-10-21 LAB — BASIC METABOLIC PANEL
Anion gap: 9 (ref 5–15)
BUN: 33 mg/dL — ABNORMAL HIGH (ref 8–23)
CO2: 22 mmol/L (ref 22–32)
Calcium: 8.6 mg/dL — ABNORMAL LOW (ref 8.9–10.3)
Chloride: 102 mmol/L (ref 98–111)
Creatinine, Ser: 1.92 mg/dL — ABNORMAL HIGH (ref 0.61–1.24)
GFR, Estimated: 39 mL/min — ABNORMAL LOW (ref >60)
Glucose, Bld: 309 mg/dL — ABNORMAL HIGH (ref 70–99)
Potassium: 4.4 mmol/L (ref 3.5–5.1)
Sodium: 133 mmol/L — ABNORMAL LOW (ref 135–145)

## 2022-10-21 LAB — CBC
HCT: 39.6 % (ref 39.0–52.0)
Hemoglobin: 13.2 g/dL (ref 13.0–17.0)
MCH: 29.9 pg (ref 26.0–34.0)
MCHC: 33.3 g/dL (ref 30.0–36.0)
MCV: 89.6 fL (ref 80.0–100.0)
Platelets: 209 10*3/uL (ref 150–400)
RBC: 4.42 MIL/uL (ref 4.22–5.81)
RDW: 13.7 % (ref 11.5–15.5)
WBC: 13.7 10*3/uL — ABNORMAL HIGH (ref 4.0–10.5)
nRBC: 0 % (ref 0.0–0.2)

## 2022-10-21 LAB — GLUCOSE, CAPILLARY
Glucose-Capillary: 206 mg/dL — ABNORMAL HIGH (ref 70–99)
Glucose-Capillary: 221 mg/dL — ABNORMAL HIGH (ref 70–99)

## 2022-10-21 MED ORDER — OXYCODONE HCL 5 MG PO TABS
5.0000 mg | ORAL_TABLET | Freq: Four times a day (QID) | ORAL | 0 refills | Status: AC | PRN
  Filled 2022-10-21: qty 15, 4d supply, fill #0

## 2022-10-21 NOTE — TOC Transition Note (Signed)
Transition of Care [facility]) - CM/SW Discharge Note   Patient Details  Name: Frank Shelton. MRN: [id number] Date of Birth: 1961/04/12  [facility] CM/SW Contact:  Leone Haven, RN Phone Number: 10/21/2022, 11:16 AM   Clinical Narrative:    Patient is for dc today, he has transportation home, TOC to fill medication.         Patient Goals and CMS Choice      Discharge Placement                         Discharge Plan and Services Additional resources added to the After Visit Summary for                                       Social Determinants of Health (SDOH) Interventions SDOH Screenings   Food Insecurity: No Food Insecurity (10/17/2022)  Housing: Low Risk  (10/17/2022)  Transportation Needs: No Transportation Needs (10/17/2022)  Utilities: Not At Risk (10/17/2022)  Tobacco Use: Medium Risk (10/21/2022)     Readmission Risk Interventions     No data to display

## 2022-10-21 NOTE — Progress Notes (Signed)
Progress Note  1 Day Post-Op  Subjective: Pt reports mild soreness but overall pain is better. Denies nausea or vomiting. Passing flatus.   Objective: Vital signs in last 24 hours: Temp:  [97.6 F (36.4 C)-97.9 F (36.6 C)] 97.7 F (36.5 C) (06/07 0850) Pulse Rate:  [64-96] 83 (06/07 0850) Resp:  [10-18] 17 (06/07 0850) BP: (105-146)/(59-74) 116/59 (06/07 0850) SpO2:  [95 %-100 %] 98 % (06/07 0850) Weight:  [150 kg-150.8 kg] 150.8 kg (06/07 0400) Last BM Date : 10/19/22  Intake/Output from previous day: 06/06 0701 - 06/07 0700 In: 1040 [P.O.:240; I.V.:800] Out: 1085 [Urine:1075; Blood:10] Intake/Output this shift: Total I/O In: 240 [P.O.:240] Out: -   PE: General: pleasant, WD, obese male who is laying in bed in NAD HEENT: sclera anicteric  Lungs:  Respiratory effort nonlabored Abd: soft, appropriately ttp, incisions C/D/I Psych: A&Ox3 with an appropriate affect.    Lab Results:  Recent Labs    10/19/22 0057 10/21/22 0043  WBC 10.1 13.7*  HGB 12.8* 13.2  HCT 38.1* 39.6  PLT 212 209   BMET Recent Labs    10/19/22 0057 10/21/22 0043  NA 138 133*  K 4.0 4.4  CL 106 102  CO2 23 22  GLUCOSE 168* 309*  BUN 32* 33*  CREATININE 1.54* 1.92*  CALCIUM 8.8* 8.6*   PT/INR No results for input(s): "LABPROT", "INR" in the last 72 hours. CMP     Component Value Date/Time   NA 133 (L) 10/21/2022 0043   NA 138 04/27/2017 1539   K 4.4 10/21/2022 0043   CL 102 10/21/2022 0043   CO2 22 10/21/2022 0043   GLUCOSE 309 (H) 10/21/2022 0043   BUN 33 (H) 10/21/2022 0043   BUN 16 04/27/2017 1539   CREATININE 1.92 (H) 10/21/2022 0043   CREATININE 0.84 01/10/2017 1007   CALCIUM 8.6 (L) 10/21/2022 0043   PROT 6.4 (L) 10/21/2022 0043   ALBUMIN 3.1 (L) 10/21/2022 0043   ALBUMIN 4.0 04/27/2017 1539   AST 48 (H) 10/21/2022 0043   ALT 38 10/21/2022 0043   ALKPHOS 59 10/21/2022 0043   BILITOT 0.6 10/21/2022 0043   GFRNONAA 39 (L) 10/21/2022 0043   GFRAA 108  04/27/2017 1539   Lipase     Component Value Date/Time   LIPASE 48 10/18/2022 0825       Studies/Results: NM Hepato W/EF  Result Date: 10/19/2022 CLINICAL DATA:  RIGHT upper quadrant discomfort. Distended gallbladder with stones identified on RIGHT upper quadrant ultrasound of 10/17/2022. EXAM: NUCLEAR MEDICINE HEPATOBILIARY IMAGING WITH GALLBLADDER EF TECHNIQUE: Sequential images of the abdomen were obtained out to 60 minutes following intravenous administration of radiopharmaceutical. After oral ingestion of Ensure, gallbladder ejection fraction was determined. At 60 min, normal ejection fraction is greater than 33%. RADIOPHARMACEUTICALS:  5.3 mCi Tc-59m  Choletec IV COMPARISON:  None Available. FINDINGS: Prompt uptake and biliary excretion of activity by the liver is seen. Gallbladder activity is visualized, consistent with patency of cystic duct. Biliary activity passes into small bowel, consistent with patent common bile duct. Calculated gallbladder ejection fraction is 90%. (Normal gallbladder ejection fraction with Ensure is greater than 33%.) IMPRESSION: Normal HIDA scan. No evidence of acute cholecystitis. Normal gallbladder ejection fraction. Electronically Signed   By: Bary Richard M.D.   On: 10/19/2022 11:04    Anti-infectives: Anti-infectives (From admission, onward)    Start     Dose/Rate Route Frequency Ordered Stop   10/20/22 0800  ceFAZolin (ANCEF) IVPB 2g/100 mL premix  2 g 200 mL/hr over 30 Minutes Intravenous  Once 10/19/22 1559          Assessment/Plan  POD1 S/p laparoscopic cholecystectomy for chronic cholecystitis  - pain improved - tolerating liquids - stable for DC from surgery standpoint, f/u in 3 weeks  - ok to resume eliquis  FEN: HH diet  VTE: resume eliquis ID: Ancef pre-op   LOS: 0 days     Juliet Rude, [facility] Surgery 10/21/2022, 10:47 AM Please see Amion for pager number during day hours 7:00am-4:30pm

## 2022-10-21 NOTE — Discharge Summary (Signed)
Physician Discharge Summary  Frank Shelton. ZOX:[id number] DOB: 01-29-1961 DOA: 10/16/2022  PCP: Vanessa [place], FNP  Admit date: 10/16/2022 Discharge date: 10/21/2022  Admitted From: Home Disposition:  Home  Discharge Condition:Stable CODE STATUS:FULL Diet recommendation: Heart Healthy  Brief/Interim Summary: Patient is a 62 year old male with history of chronic PE on Eliquis, cardiac arrest, type 2 diabetes, hypertension, hypothyroidism, chronic diastolic CHF who presented with complaint of chest pain, right upper quadrant abdominal pain.  On presentation, he was hemodynamically stable.  Lab work showed creatinine of 1.4.  EKG showed ST changes.  CTA chest did not show any acute PE.  Cardiology was initially consulted for chest pain but it has been ruled out.  Patient continues to complain of right upper quadrant discomfort.  Imagings have showed cholelithiasis,, distended gallbladder.  General surgery consulted ,s/p lap chole.  General surgery cleared for discharge home today.  Following problems were addressed during the hospitalization: Chest pain/coronary artery disease: Presented with left-sided chest pressure associated with exertion but radiating from RUQ.  Troponins were not elevated.  EKG did not show any ischemic changes.  Cardiology was initially consulted.  Chest pain is atypical.  Echo did not show any significant change from January this year.  Patient was most likely associated  with gallbladder issue.  CT chest did not show any acute PE.  cardiology signed off.  Chest pain has resolved.   Right upper quadrant abdominal pain: He has been having right upper quadrant pain since last few months.  Continues to complain of pain on that area.  Liver enzymes normal.  He is afebrile, no leukocytosis.  Ultrasound of the gallbladder showed gallbladder distention, stones, wall thickening of the 3 millimeter.  HIDA negative.  Since he has persistent symptoms, general surgery did lap chole    History of diastolic CHF: Currently appears euvolemic.  Echo done here showed EF of 55 to 60%, normal diastolic parameters   CKD stage IIIb: Baseline creatinine around 1.9.  Currently kidney function at baseline.   History of recurrent PE: CTA chest on this admission did not show any acute PE but showed a stable left lower lobe thrombus.  On Eliqui   Hypertension: Continue metoprolol   Hypothyroidism: Continue Synthyroid   Type 2 diabetes: Most recent A1c of 7.6.  Continue current insulin regimen.   Morbid obesity: BMI 39.7  Leukocytosis: Mild.  Most likely reactive.  Check CBC in a week  Discharge Diagnoses:  Principal Problem:   Chest pain Active Problems:   Essential hypertension   DM2 (diabetes mellitus, type 2) (HCC)   Acquired hypothyroidism   Chronic pulmonary embolism (HCC)   Chronic diastolic CHF (congestive heart failure) (HCC)    Discharge Instructions  Discharge Instructions     Diet - low sodium heart healthy   Complete by: As directed    Discharge instructions   Complete by: As directed    1)Please take prescribed medications as instructed 2)Follow up with your PCP in a week.  Do a CBC, BMP test during the follow-up. 3)Follow up with general surgery as an outpatient.  Name and number the provider has been attached   Increase activity slowly   Complete by: As directed       Allergies as of 10/21/2022       Reactions   Nabumetone Hives        Medication List     TAKE these medications    acetaminophen 325 MG tablet Commonly known as: TYLENOL Take 650 mg  by mouth every 6 (six) hours as needed for moderate pain or headache.   albuterol 108 (90 Base) MCG/ACT inhaler Commonly known as: VENTOLIN HFA Inhale 2 puffs into the lungs every 4 (four) hours as needed for wheezing or shortness of breath.   ascorbic acid 500 MG tablet Commonly known as: VITAMIN C Take 500 mg by mouth daily.   atorvastatin 40 MG tablet Commonly known as: LIPITOR Take  40 mg by mouth daily.   B-12 5000 MCG Caps Take 5,000 mcg by mouth daily.   BD Pen Needle Nano U/F 32G X 4 MM Misc Generic drug: Insulin Pen Needle Use to inject insulin.   budesonide-formoterol 160-4.5 MCG/ACT inhaler Commonly known as: SYMBICORT Inhale 2 puffs into the lungs 2 (two) times daily as needed (for shortness of breath).   calcium-vitamin D 500-200 MG-UNIT tablet Commonly known as: Oscal 500/200 D-3 Take 1 tablet by mouth 2 (two) times daily.   colchicine 0.6 MG tablet Take 0.6 mg by mouth daily as needed (for gout flare/pain).   Eliquis 5 MG Tabs tablet Generic drug: apixaban Take 1 tablet (5 mg total) by mouth 2 (two) times daily.   furosemide 20 MG tablet Commonly known as: LASIX Take 20 mg by mouth daily as needed.   levothyroxine 125 MCG tablet Commonly known as: SYNTHROID Take 2 tablets (250 mcg total) by mouth daily at 6 (six) AM.   metoprolol tartrate 25 MG tablet Commonly known as: LOPRESSOR Take 1 tablet (25 mg total) by mouth 2 (two) times daily.   multivitamin with minerals Tabs tablet Take 1 tablet by mouth daily.   oxyCODONE 5 MG immediate release tablet Commonly known as: Oxy IR/ROXICODONE Take 1 tablet (5 mg total) by mouth every 6 (six) hours as needed for moderate pain.   sodium bicarbonate 650 MG tablet Take 1 tablet (650 mg total) by mouth daily.        Follow-up Information     Maczis, Hedda Slade, [place]. Go on 11/08/2022.   Specialty: General Surgery Why: 11/08/22 at 11:15 am. Please arrive 30 minutes early to complete check in, and bring photo ID and insurance card. Contact information: [address] [facility] [place] [address] [phone]         Vanessa [place], FNP Follow up.   Specialty: Family Medicine Why: Please follow up in a week Contact information: [address] [place] [address] [phone]                Allergies  Allergen Reactions    Nabumetone Hives    Consultations: Surgery   Procedures/Studies: NM Hepato W/EF  Result Date: 10/19/2022 CLINICAL DATA:  RIGHT upper quadrant discomfort. Distended gallbladder with stones identified on RIGHT upper quadrant ultrasound of 10/17/2022. EXAM: NUCLEAR MEDICINE HEPATOBILIARY IMAGING WITH GALLBLADDER EF TECHNIQUE: Sequential images of the abdomen were obtained out to 60 minutes following intravenous administration of radiopharmaceutical. After oral ingestion of Ensure, gallbladder ejection fraction was determined. At 60 min, normal ejection fraction is greater than 33%. RADIOPHARMACEUTICALS:  5.3 mCi Tc-81m  Choletec IV COMPARISON:  None Available. FINDINGS: Prompt uptake and biliary excretion of activity by the liver is seen. Gallbladder activity is visualized, consistent with patency of cystic duct. Biliary activity passes into small bowel, consistent with patent common bile duct. Calculated gallbladder ejection fraction is 90%. (Normal gallbladder ejection fraction with Ensure is greater than 33%.) IMPRESSION: Normal HIDA scan. No evidence of acute cholecystitis. Normal gallbladder ejection fraction. Electronically Signed  By: Bary Richard M.D.   On: 10/19/2022 11:04   US Abdomen Limited RUQ (LIVER/GB)  Result Date: 10/17/2022 CLINICAL DATA:  Right upper quadrant pain EXAM: ULTRASOUND ABDOMEN LIMITED RIGHT UPPER QUADRANT COMPARISON:  CT angiogram chest 10/16/2022 FINDINGS: Gallbladder: Mildly distended gallbladder with multiple stones. No adjacent fluid. Borderline wall thickening at 3 mm Common bile duct: Diameter: 6 mm Liver: Diffusely echogenic hepatic parenchyma consistent with fatty liver infiltration. Portal vein is patent on color Doppler imaging with normal direction of blood flow towards the liver. Other: None. IMPRESSION: Distended gallbladder with stones. No adjacent fluid. Borderline wall thickness of 3 mm. If there is further concern of acute cholecystitis, HIDA scan could be  considered as clinically appropriate No ductal dilatation. Electronically Signed   By: Karen Kays M.D.   On: 10/17/2022 18:38   ECHOCARDIOGRAM COMPLETE  Result Date: 10/17/2022    ECHOCARDIOGRAM REPORT   Patient Name:   Leemon Shabani. Date of Exam: 10/17/2022 Medical Rec #:  [id number]          Height:       76.0 in Accession #:    [id number]         Weight:       326.9 lb Date of Birth:  1960-09-19          BSA:          2.730 m Patient Age:    61 years           BP:           101/86 mmHg Patient Gender: M                  HR:           75 bpm. Exam Location:  Inpatient Procedure: 2D Echo, Cardiac Doppler and Color Doppler Indications:    Chest Pain  History:        Patient has prior history of Echocardiogram examinations, most                 recent 05/18/2022. CHF, Previous Myocardial Infarction and CAD,                 Arrythmias:Cardiac Arrest, Signs/Symptoms:Chest Pain; Risk                 Factors:Diabetes and Hypertension.  Sonographer:    Darlys Gales Referring Phys: [id number] Angie Fava  Sonographer Comments: Technically difficult study due to poor echo windows. Image acquisition challenging due to respiratory motion. IMPRESSIONS  1. Technically difficult study with very limited visualization of cardiac structures.  2. Left ventricular ejection fraction, by estimation, is 55 to 60%. The left ventricle has normal function. The left ventricle has no regional wall motion abnormalities. Left ventricular diastolic parameters were normal.  3. Right ventricular systolic function is normal. The right ventricular size is normal.  4. The mitral valve was not well visualized. No evidence of mitral valve regurgitation. No evidence of mitral stenosis.  5. The aortic valve is normal in structure. There is mild calcification of the aortic valve. Aortic valve regurgitation is not visualized. No aortic stenosis is present. FINDINGS  Left Ventricle: Left ventricular ejection fraction, by estimation, is 55 to 60%.  The left ventricle has normal function. The left ventricle has no regional wall motion abnormalities. The left ventricular internal cavity size was normal in size. There is  no left ventricular hypertrophy. Left ventricular diastolic parameters were normal. Right Ventricle: The right ventricular size  is normal. No increase in right ventricular wall thickness. Right ventricular systolic function is normal. Left Atrium: Left atrial size was normal in size. Right Atrium: Right atrial size was normal in size. Pericardium: There is no evidence of pericardial effusion. Mitral Valve: The mitral valve was not well visualized. No evidence of mitral valve regurgitation. No evidence of mitral valve stenosis. Tricuspid Valve: The tricuspid valve is not well visualized. Tricuspid valve regurgitation is not demonstrated. No evidence of tricuspid stenosis. Aortic Valve: The aortic valve is normal in structure. There is mild calcification of the aortic valve. Aortic valve regurgitation is not visualized. No aortic stenosis is present. Aortic valve mean gradient measures 3.0 mmHg. Aortic valve peak gradient measures 5.7 mmHg. Aortic valve area, by VTI measures 3.13 cm. Pulmonic Valve: The pulmonic valve was not assessed. Pulmonic valve regurgitation is not visualized. No evidence of pulmonic stenosis. Aorta: The aortic root is normal in size and structure. Venous: The inferior vena cava was not well visualized. IAS/Shunts: No atrial level shunt detected by color flow Doppler.  LEFT VENTRICLE PLAX 2D LVIDd:         4.30 cm   Diastology LVIDs:         2.70 cm   LV e' medial:    6.64 cm/s LV PW:         0.80 cm   LV E/e' medial:  9.6 LV IVS:        1.00 cm   LV e' lateral:   10.60 cm/s LVOT diam:     2.00 cm   LV E/e' lateral: 6.0 LV SV:         68 LV SV Index:   25 LVOT Area:     3.14 cm  RIGHT VENTRICLE RV S prime:     9.14 cm/s TAPSE (M-mode): 2.5 cm LEFT ATRIUM             Index        RIGHT ATRIUM           Index LA Vol (A2C):    83.5 ml 30.59 ml/m  RA Area:     14.60 cm LA Vol (A4C):   76.1 ml 27.88 ml/m  RA Volume:   32.90 ml  12.05 ml/m LA Biplane Vol: 81.6 ml 29.89 ml/m  AORTIC VALVE AV Area (Vmax):    2.61 cm AV Area (Vmean):   2.79 cm AV Area (VTI):     3.13 cm AV Vmax:           119.00 cm/s AV Vmean:          83.100 cm/s AV VTI:            0.218 m AV Peak Grad:      5.7 mmHg AV Mean Grad:      3.0 mmHg LVOT Vmax:         98.80 cm/s LVOT Vmean:        73.800 cm/s LVOT VTI:          0.217 m LVOT/AV VTI ratio: 1.00 MITRAL VALVE MV Area (PHT): 2.40 cm    SHUNTS MV Decel Time: 316 msec    Systemic VTI:  0.22 m MV E velocity: 63.90 cm/s  Systemic Diam: 2.00 cm MV A velocity: 76.00 cm/s MV E/A ratio:  0.84 Aditya Sabharwal Electronically signed by Dorthula Nettles Signature Date/Time: 10/17/2022/11:07:03 AM    Final    CT Angio Chest PE W and/or Wo Contrast  Result Date: 10/16/2022 CLINICAL DATA:  Pulmonary embolism (PE) suspected, high prob Chest pain and shortness of breath. Radiologic records indicates history of pulmonary embolus. EXAM: CT ANGIOGRAPHY CHEST WITH CONTRAST TECHNIQUE: Multidetector CT imaging of the chest was performed using the standard protocol during bolus administration of intravenous contrast. Multiplanar CT image reconstructions and MIPs were obtained to evaluate the vascular anatomy. RADIATION DOSE REDUCTION: This exam was performed according to the departmental dose-optimization program which includes automated exposure control, adjustment of the mA and/or kV according to patient size and/or use of iterative reconstruction technique. CONTRAST:  75mL OMNIPAQUE IOHEXOL 350 MG/ML SOLN COMPARISON:  Radiograph earlier today. Chest CTA 05/12/2022 FINDINGS: Cardiovascular: The previous right-sided pulmonary emboli have resolved. No right-sided chronic thrombus. No acute pulmonary arterial filling defects or acute pulmonary emboli. Small focus of chronic thrombus within the left lower lobe segmental bifurcation is  unchanged from prior exam, series 5, image 73. mild aortic atherosclerosis. No aortic aneurysm. Mild cardiomegaly. No pericardial effusion. Mediastinum/Nodes: No enlarged mediastinal or hilar lymph nodes. Scattered small mediastinal nodes are not enlarged by size criteria. Thyroidectomy. Small hiatal hernia. Lungs/Pleura: Motion artifact in the lower lobes. Mild heterogeneous pulmonary parenchyma. No focal or confluent consolidation. No pleural fluid. No suspicious pulmonary nodule. The trachea and central airways are clear. Upper Abdomen: Layering sludge or gallstones in the gallbladder without inflammation. No acute upper abdominal finding. Musculoskeletal: There are no acute or suspicious osseous abnormalities. Bilateral gynecomastia. Review of the MIP images confirms the above findings. IMPRESSION: 1. No acute pulmonary emboli. The previous right-sided pulmonary emboli have resolved. Small focus of chronic thrombus within the left lower lobe segmental bifurcation is unchanged from prior exam. 2. Mild heterogeneous pulmonary parenchyma, can be seen with small airways disease. 3. Mild cardiomegaly. 4. Small hiatal hernia. 5. Layering sludge or gallstones in the gallbladder without inflammation. Aortic Atherosclerosis (ICD10-I70.0). Electronically Signed   By: Narda Rutherford M.D.   On: 10/16/2022 23:02   DG Chest Port 1 View  Result Date: 10/16/2022 CLINICAL DATA:  Left-sided chest pain, shortness of breath, and dizziness today. EXAM: PORTABLE CHEST 1 VIEW COMPARISON:  06/04/2022 FINDINGS: Shallow inspiration. Mild cardiac enlargement. No vascular congestion, edema, or consolidation. Linear atelectasis suggested in the left lung base. No pleural effusions. No pneumothorax. Mediastinal contours appear intact. Surgical clips in the base of the neck. IMPRESSION: Cardiac enlargement. Shallow inspiration with linear atelectasis in the left base. Electronically Signed   By: Burman Nieves M.D.   On: 10/16/2022  18:29      Subjective: Patient seen and examined at bedside today.  Hemodynamically stable.  Comfortable.  Denies any abdominal pain.  Medically stable for discharge  Discharge Exam: Vitals:   10/21/22 0400 10/21/22 0850  BP: 118/70 (!) 116/59  Pulse: 85 83  Resp: 18 17  Temp: 97.7 F (36.5 C) 97.7 F (36.5 C)  SpO2: 96% 98%   Vitals:   10/20/22 1922 10/20/22 2334 10/21/22 0400 10/21/22 0850  BP: 123/65 (!) 117/59 118/70 (!) 116/59  Pulse: 68 96 85 83  Resp: 18 18 18 17   Temp: 97.9 F (36.6 C) 97.6 F (36.4 C) 97.7 F (36.5 C) 97.7 F (36.5 C)  TempSrc: Oral Oral Oral Oral  SpO2: 97% 95% 96% 98%  Weight:   (!) 150.8 kg   Height:        General: Pt is alert, awake, not in acute distress Cardiovascular: RRR, S1/S2 +, no rubs, no gallops Respiratory: CTA bilaterally, no wheezing, no rhonchi Abdominal: Soft, NT, ND, bowel sounds + Extremities: no  edema, no cyanosis, lap chole surgical wounds    The results of significant diagnostics from this hospitalization (including imaging, microbiology, ancillary and laboratory) are listed below for reference.     Microbiology: Recent Results (from the past 240 hour(s))  Surgical PCR screen     Status: None   Collection Time: 10/19/22  9:49 PM   Specimen: Nasal Mucosa; Nasal Swab  Result Value Ref Range Status   MRSA, PCR NEGATIVE NEGATIVE Final   Staphylococcus aureus NEGATIVE NEGATIVE Final    Comment: (NOTE) The Xpert SA Assay (FDA approved for NASAL specimens in patients 87 years of age and older), is one component of a comprehensive surveillance program. It is not intended to diagnose infection nor to guide or monitor treatment. Performed at [facility], [address], [place], [place] [address]      Labs: BNP (last 3 results) Recent Labs    06/07/22 0725 06/08/22 0804 06/09/22 0455  BNP 40.2 34.4 38.1   Basic Metabolic Panel: Recent Labs  Lab 10/16/22 1725 10/17/22 0216 10/18/22 0825  10/19/22 0057 10/21/22 0043  NA 139 138 138 138 133*  K 3.8 3.8 4.0 4.0 4.4  CL 104 105 108 106 102  CO2 23 21* 19* 23 22  GLUCOSE 220* 119* 198* 168* 309*  BUN 33* 31* 31* 32* 33*  CREATININE 1.46* 1.53* 1.67* 1.54* 1.92*  CALCIUM 9.7 9.4 8.7* 8.8* 8.6*  MG  --  2.2  --   --   --    Liver Function Tests: Recent Labs  Lab 10/17/22 0216 10/18/22 0825 10/21/22 0043  AST 22 19 48*  ALT 20 19 38  ALKPHOS 59 56 59  BILITOT 0.9 0.8 0.6  PROT 7.3 6.1* 6.4*  ALBUMIN 3.7 3.2* 3.1*   Recent Labs  Lab 10/17/22 0550 10/18/22 0825  LIPASE 56* 48   No results for input(s): "AMMONIA" in the last 168 hours. CBC: Recent Labs  Lab 10/16/22 1725 10/17/22 0216 10/19/22 0057 10/21/22 0043  WBC 9.5 10.5 10.1 13.7*  NEUTROABS  --  5.4  --   --   HGB 16.0 14.6 12.8* 13.2  HCT 47.1 43.1 38.1* 39.6  MCV 90.9 90.5 89.6 89.6  PLT 229 236 212 209   Cardiac Enzymes: No results for input(s): "CKTOTAL", "CKMB", "CKMBINDEX", "TROPONINI" in the last 168 hours. BNP: Invalid input(s): "POCBNP" CBG: Recent Labs  Lab 10/20/22 1329 10/20/22 1524 10/20/22 1606 10/20/22 2331 10/21/22 0606  GLUCAP 122* 133* 130* 275* 206*   D-Dimer No results for input(s): "DDIMER" in the last 72 hours. Hgb A1c No results for input(s): "HGBA1C" in the last 72 hours. Lipid Profile No results for input(s): "CHOL", "HDL", "LDLCALC", "TRIG", "CHOLHDL", "LDLDIRECT" in the last 72 hours. Thyroid function studies No results for input(s): "TSH", "T4TOTAL", "T3FREE", "THYROIDAB" in the last 72 hours.  Invalid input(s): "FREET3" Anemia work up No results for input(s): "VITAMINB12", "FOLATE", "FERRITIN", "TIBC", "IRON", "RETICCTPCT" in the last 72 hours. Urinalysis    Component Value Date/Time   COLORURINE YELLOW 11/08/2021 2120   APPEARANCEUR CLEAR 11/08/2021 2120   LABSPEC 1.011 11/08/2021 2120   PHURINE 6.0 11/08/2021 2120   GLUCOSEU NEGATIVE 11/08/2021 2120   HGBUR SMALL (A) 11/08/2021 2120    BILIRUBINUR NEGATIVE 11/08/2021 2120   KETONESUR NEGATIVE 11/08/2021 2120   PROTEINUR NEGATIVE 11/08/2021 2120   NITRITE NEGATIVE 11/08/2021 2120   LEUKOCYTESUR TRACE (A) 11/08/2021 2120   Sepsis Labs Recent Labs  Lab 10/16/22 1725 10/17/22 0216 10/19/22 0057 10/21/22 0043  WBC 9.5 10.5 10.1 13.7*   Microbiology Recent Results (from the past 240 hour(s))  Surgical PCR screen     Status: None   Collection Time: 10/19/22  9:49 PM   Specimen: Nasal Mucosa; Nasal Swab  Result Value Ref Range Status   MRSA, PCR NEGATIVE NEGATIVE Final   Staphylococcus aureus NEGATIVE NEGATIVE Final    Comment: (NOTE) The Xpert SA Assay (FDA approved for NASAL specimens in patients 35 years of age and older), is one component of a comprehensive surveillance program. It is not intended to diagnose infection nor to guide or monitor treatment. Performed at [facility], [address] [place], [place] [address]     Please note: You were cared for by a hospitalist during your hospital stay. Once you are discharged, your primary care physician will handle any further medical issues. Please note that NO REFILLS for any discharge medications will be authorized once you are discharged, as it is imperative that you return to your primary care physician (or establish a relationship with a primary care physician if you do not have one) for your post hospital discharge needs so that they can reassess your need for medications and monitor your lab values.    Time coordinating discharge: 40 minutes  SIGNED:   Burnadette Pop, MD  [facility] 10/21/2022, 10:56 AM Pager [phone]  If 7PM-7AM, please contact night-coverage [email or web address] Password [email or web address]

## 2022-10-21 NOTE — Discharge Instructions (Signed)
[facility] ° °Please arrive at least 30 min before your appointment to complete your check in paperwork.  If you are unable to arrive 30 min prior to your appointment time we may have to cancel or reschedule you. °LAPAROSCOPIC SURGERY: POST OP INSTRUCTIONS °Always review your discharge instruction sheet given to you by the facility where your surgery was performed. °IF YOU HAVE DISABILITY OR FAMILY LEAVE FORMS, YOU MUST BRING THEM TO THE OFFICE FOR PROCESSING.   °DO NOT GIVE THEM TO YOUR DOCTOR. ° °PAIN CONTROL ° °First take acetaminophen (Tylenol) AND/or ibuprofen (Advil) to control your pain after surgery.  Follow directions on package.  Taking acetaminophen (Tylenol) and/or ibuprofen (Advil) regularly after surgery will help to control your pain and lower the amount of prescription pain medication you may need.  You should not take more than 4,000 mg (4 grams) of acetaminophen (Tylenol) in 24 hours.  You should not take ibuprofen (Advil), aleve, motrin, naprosyn or other NSAIDS if you have a history of stomach ulcers or chronic kidney disease.  °A prescription for pain medication may be given to you upon discharge.  Take your pain medication as prescribed, if you still have uncontrolled pain after taking acetaminophen (Tylenol) or ibuprofen (Advil). °Use ice packs to help control pain. °If you need a refill on your pain medication, please contact your pharmacy.  They will contact our office to request authorization. Prescriptions will not be filled after 5pm or on week-ends. ° °HOME MEDICATIONS °Take your usually prescribed medications unless otherwise directed. ° °DIET °You should follow a light diet the first few days after arrival home.  Be sure to include lots of fluids daily. Avoid fatty, fried foods.  ° °CONSTIPATION °It is common to experience some constipation after surgery and if you are taking pain medication.  Increasing fluid intake and taking a stool softener (such as Colace)  will usually help or prevent this problem from occurring.  A mild laxative (Milk of Magnesia or Miralax) should be taken according to package instructions if there are no bowel movements after 48 hours. ° °WOUND/INCISION CARE °Most patients will experience some swelling and bruising in the area of the incisions.  Ice packs will help.  Swelling and bruising can take several days to resolve.  °Unless discharge instructions indicate otherwise, follow guidelines below  °STERI-STRIPS - you may remove your outer bandages 48 hours after surgery, and you may shower at that time.  You have steri-strips (small skin tapes) in place directly over the incision.  These strips should be left on the skin for 7-10 days.   °DERMABOND/SKIN GLUE - you may shower in 24 hours.  The glue will flake off over the next 2-3 weeks. °Any sutures or staples will be removed at the office during your follow-up visit. ° °ACTIVITIES °You may resume regular (light) daily activities beginning the next day--such as daily self-care, walking, climbing stairs--gradually increasing activities as tolerated.  You may have sexual intercourse when it is comfortable.  Refrain from any heavy lifting or straining until approved by your doctor. °You may drive when you are no longer taking prescription pain medication, you can comfortably wear a seatbelt, and you can safely maneuver your car and apply brakes. ° °FOLLOW-UP °You should see your doctor in the office for a follow-up appointment approximately 2-3 weeks after your surgery.  You should have been given your post-op/follow-up appointment when your surgery was scheduled.  If you did not receive a post-op/follow-up appointment, make sure   that you call for this appointment within a day or two after you arrive home to insure a convenient appointment time. ° ° °WHEN TO CALL YOUR DOCTOR: °Fever over 101.0 °Inability to urinate °Continued bleeding from incision. °Increased pain, redness, or drainage from the  incision. °Increasing abdominal pain ° °The clinic staff is available to answer your questions during regular business hours.  Please don’t hesitate to call and ask to speak to one of the nurses for clinical concerns.  If you have a medical emergency, go to the nearest emergency room or call 911.  A surgeon from [facility] is always on call at the hospital. °[address], [address], [place], [place]  [address] ? [address] [address], [place], [place]   [address] °[phone] ? [phone] ? FAX [phone] ° ° ° ° °Managing Your Pain After Surgery Without Opioids ° ° ° °Thank you for participating in our program to help patients manage their pain after surgery without opioids. This is part of our effort to provide you with the best care possible, without exposing you or your family to the risk that opioids pose. ° °What pain can I expect after surgery? °You can expect to have some pain after surgery. This is normal. The pain is typically worse the day after surgery, and quickly begins to get better. °Many studies have found that many patients are able to manage their pain after surgery with Over-the-Counter (OTC) medications such as Tylenol and Motrin. If you have a condition that does not allow you to take Tylenol or Motrin, notify your surgical team. ° °How will I manage my pain? °The best strategy for controlling your pain after surgery is around the clock pain control with Tylenol (acetaminophen) and Motrin (ibuprofen or Advil). Alternating these medications with each other allows you to maximize your pain control. In addition to Tylenol and Motrin, you can use heating pads or ice packs on your incisions to help reduce your pain. ° °How will I alternate your regular strength over-the-counter pain medication? °You will take a dose of pain medication every three hours. °Start by taking 650 mg of Tylenol (2 pills of 325 mg) °3 hours later take 600 mg of Motrin (3 pills of 200 mg) °3 hours after  taking the Motrin take 650 mg of Tylenol °3 hours after that take 600 mg of Motrin. ° ° °- 1 - ° °See example - if your first dose of Tylenol is at 12:00 PM ° ° °12:00 PM Tylenol 650 mg (2 pills of 325 mg)  °3:00 PM Motrin 600 mg (3 pills of 200 mg)  °6:00 PM Tylenol 650 mg (2 pills of 325 mg)  °9:00 PM Motrin 600 mg (3 pills of 200 mg)  °Continue alternating every 3 hours  ° °We recommend that you follow this schedule around-the-clock for at least 3 days after surgery, or until you feel that it is no longer needed. Use the table on the last page of this handout to keep track of the medications you are taking. °Important: °Do not take more than 3000mg of Tylenol or 3200mg of Motrin in a 24-hour period. °Do not take ibuprofen/Motrin if you have a history of bleeding stomach ulcers, severe kidney disease, &/or actively taking a blood thinner ° °What if I still have pain? °If you have pain that is not controlled with the over-the-counter pain medications (Tylenol and Motrin or Advil) you might have what we call “breakthrough” pain. You will receive a prescription   for a small amount of an opioid pain medication such as Oxycodone, Tramadol, or Tylenol with Codeine. Use these opioid pills in the first 24 hours after surgery if you have breakthrough pain. Do not take more than 1 pill every 4-6 hours. ° °If you still have uncontrolled pain after using all opioid pills, don't hesitate to call our staff using the number provided. We will help make sure you are managing your pain in the best way possible, and if necessary, we can provide a prescription for additional pain medication. ° ° °Day 1   ° °Time  °Name of Medication Number of pills taken  °Amount of Acetaminophen  °Pain Level  ° °Comments  °AM PM       °AM PM       °AM PM       °AM PM       °AM PM       °AM PM       °AM PM       °AM PM       °Total Daily amount of Acetaminophen °Do not take more than  3,000 mg per day    ° ° °Day 2   ° °Time  °Name of Medication  Number of pills °taken  °Amount of Acetaminophen  °Pain Level  ° °Comments  °AM PM       °AM PM       °AM PM       °AM PM       °AM PM       °AM PM       °AM PM       °AM PM       °Total Daily amount of Acetaminophen °Do not take more than  3,000 mg per day    ° ° °Day 3   ° °Time  °Name of Medication Number of pills taken  °Amount of Acetaminophen  °Pain Level  ° °Comments  °AM PM       °AM PM       °AM PM       °AM PM       ° ° ° °AM PM       °AM PM       °AM PM       °AM PM       °Total Daily amount of Acetaminophen °Do not take more than  3,000 mg per day    ° ° °Day 4   ° °Time  °Name of Medication Number of pills taken  °Amount of Acetaminophen  °Pain Level  ° °Comments  °AM PM       °AM PM       °AM PM       °AM PM       °AM PM       °AM PM       °AM PM       °AM PM       °Total Daily amount of Acetaminophen °Do not take more than  3,000 mg per day    ° ° °Day 5   ° °Time  °Name of Medication Number °of pills taken  °Amount of Acetaminophen  °Pain Level  ° °Comments  °AM PM       °AM PM       °AM PM       °AM PM       °AM PM       °AM   PM       °AM PM       °AM PM       °Total Daily amount of Acetaminophen °Do not take more than  3,000 mg per day    ° ° ° °Day 6   ° °Time  °Name of Medication Number of pills °taken  °Amount of Acetaminophen  °Pain Level  °Comments  °AM PM       °AM PM       °AM PM       °AM PM       °AM PM       °AM PM       °AM PM       °AM PM       °Total Daily amount of Acetaminophen °Do not take more than  3,000 mg per day    ° ° °Day 7   ° °Time  °Name of Medication Number of pills taken  °Amount of Acetaminophen  °Pain Level  ° °Comments  °AM PM       °AM PM       °AM PM       °AM PM       °AM PM       °AM PM       °AM PM       °AM PM       °Total Daily amount of Acetaminophen °Do not take more than  3,000 mg per day    ° ° ° ° °For additional information about how and where to safely dispose of unused opioid °medications - https://www.morepowerfulnc.org ° °Disclaimer: This document  contains information and/or instructional materials adapted from [facility] for the typical patient with your condition. It does not replace medical advice from your health care provider because your experience may differ from that of the °typical patient. Talk to your health care provider if you have any questions about this °document, your condition or your treatment plan. °Adapted from [facility] ° °

## 2022-10-24 LAB — SURGICAL PATHOLOGY

## 2022-12-24 ENCOUNTER — Emergency Department (HOSPITAL_COMMUNITY)
Admission: EM | Admit: 2022-12-24 | Discharge: 2022-12-25 | Disposition: A | Discharge status: Home / Self Care | Payer: Medicare PPO | Attending: Emergency Medicine | Admitting: Emergency Medicine

## 2022-12-24 ENCOUNTER — Encounter (HOSPITAL_COMMUNITY): Payer: Self-pay | Admitting: Emergency Medicine

## 2022-12-24 ENCOUNTER — Other Ambulatory Visit: Payer: Self-pay

## 2022-12-24 ENCOUNTER — Emergency Department (HOSPITAL_COMMUNITY): Payer: Medicare PPO

## 2022-12-24 DIAGNOSIS — R61 Generalized hyperhidrosis: Secondary | ICD-10-CM | POA: Insufficient documentation

## 2022-12-24 DIAGNOSIS — I10 Essential (primary) hypertension: Secondary | ICD-10-CM | POA: Insufficient documentation

## 2022-12-24 DIAGNOSIS — R079 Chest pain, unspecified: Secondary | ICD-10-CM | POA: Diagnosis not present

## 2022-12-24 DIAGNOSIS — E119 Type 2 diabetes mellitus without complications: Secondary | ICD-10-CM | POA: Insufficient documentation

## 2022-12-24 DIAGNOSIS — Z794 Long term (current) use of insulin: Secondary | ICD-10-CM | POA: Diagnosis not present

## 2022-12-24 DIAGNOSIS — Z7901 Long term (current) use of anticoagulants: Secondary | ICD-10-CM | POA: Diagnosis not present

## 2022-12-24 DIAGNOSIS — E039 Hypothyroidism, unspecified: Secondary | ICD-10-CM | POA: Insufficient documentation

## 2022-12-24 DIAGNOSIS — Z79899 Other long term (current) drug therapy: Secondary | ICD-10-CM | POA: Insufficient documentation

## 2022-12-24 DIAGNOSIS — R42 Dizziness and giddiness: Secondary | ICD-10-CM

## 2022-12-24 DIAGNOSIS — R202 Paresthesia of skin: Secondary | ICD-10-CM | POA: Insufficient documentation

## 2022-12-24 DIAGNOSIS — R109 Unspecified abdominal pain: Secondary | ICD-10-CM | POA: Diagnosis not present

## 2022-12-24 LAB — CBC
HCT: 40.7 % (ref 39.0–52.0)
Hemoglobin: 13.7 g/dL (ref 13.0–17.0)
MCH: 31.1 pg (ref 26.0–34.0)
MCHC: 33.7 g/dL (ref 30.0–36.0)
MCV: 92.5 fL (ref 80.0–100.0)
Platelets: 218 10*3/uL (ref 150–400)
RBC: 4.4 MIL/uL (ref 4.22–5.81)
RDW: 14.6 % (ref 11.5–15.5)
WBC: 10.8 10*3/uL — ABNORMAL HIGH (ref 4.0–10.5)
nRBC: 0 % (ref 0.0–0.2)

## 2022-12-24 NOTE — ED Triage Notes (Signed)
Pt complains of right sided chest pain and back pain x 3 weeks. Pt states gallbladder was removed in June and has been having this pain on and off since then. Today started to have lip, hands and feet numbness.

## 2022-12-25 LAB — BASIC METABOLIC PANEL WITH GFR
Anion gap: 13 (ref 5–15)
BUN: 40 mg/dL — ABNORMAL HIGH (ref 8–23)
CO2: 19 mmol/L — ABNORMAL LOW (ref 22–32)
Calcium: 9.3 mg/dL (ref 8.9–10.3)
Chloride: 105 mmol/L (ref 98–111)
Creatinine, Ser: 1.81 mg/dL — ABNORMAL HIGH (ref 0.61–1.24)
GFR, Estimated: 41 mL/min — ABNORMAL LOW (ref >60)
Glucose, Bld: 219 mg/dL — ABNORMAL HIGH (ref 70–99)
Potassium: 3.8 mmol/L (ref 3.5–5.1)
Sodium: 137 mmol/L (ref 135–145)

## 2022-12-25 LAB — TROPONIN I (HIGH SENSITIVITY): Troponin I (High Sensitivity): 6 ng/L (ref <18)

## 2022-12-25 LAB — MAGNESIUM: Magnesium: 2.1 mg/dL (ref 1.7–2.4)

## 2022-12-25 LAB — CK: Total CK: 56 U/L (ref 49–397)

## 2022-12-25 MED ORDER — SODIUM CHLORIDE 0.9 % IV BOLUS
1000.0000 mL | Freq: Once | INTRAVENOUS | Status: AC
  Administered 2022-12-25: 1000 mL via INTRAVENOUS

## 2022-12-25 NOTE — ED Notes (Signed)
Orthostatic VS Lying BP 125/64 PR 80 Sitting BP 116/77 PR 90 Standing 0 min BP 107/65 PR 115 Standing 3 min BP 112/76 PR 115 VS obtained prior to starting IVF. EDP Wickline informed of findings.

## 2022-12-25 NOTE — ED Provider Notes (Signed)
[facility] AT [facility] Provider Note   CSN: [id number] Arrival date & time: 12/24/22  2248     History  Chief Complaint  Patient presents with   Chest Pain   Numbness    Frank Shelton. is a 62 y.o. male.  The history is provided by the patient and a relative.   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  He reports it has been ongoing on a daily basis.  He reports it is very sensitive to touch.  Patient reports that he has been working outside all day for over 8 hours.  He reports at the end of the day he started having lightheadedness, diaphoretic and felt as though he might pass out.  He also reports ongoing numbness in all of his extremities it has been present for months.  He reports tonight all of his lips became numb.  No facial weakness.  No slurred speech.  No syncope was reported.  Patient suspects he may have had dehydration from being outside.  He reports drinking some fluids and trying to maintain urine output  No new chest pain.  No pain is radiating from his chest to his back.  No new abdominal pain  Son is at bedside.  He denies patient having any mental status changes or slurred speech Past Medical History:  Diagnosis Date   Arthritis    L shoulder & fingers    Bronchitis    during enviromental changes    Complication of anesthesia    Report of airway problem   Diabetes mellitus without complication (HCC)    Dyspnea    Embolism - blood clot    Gout    Headache    uses ibuprofen & tylenol & its relieved    Hypertension    Hypothyroidism    Myocardial infarction (HCC) 2012   OSA on CPAP    Peripheral vascular disease (HCC)    Pulmonary  embolism (HCC)    Stroke (HCC) 2014    Home Medications Prior to Admission medications   Medication Sig Start Date End Date Taking? Authorizing Provider  acetaminophen (TYLENOL) 325 MG tablet Take 650 mg by mouth every 6 (six) hours as needed for moderate pain or headache.    [provider]  albuterol (VENTOLIN HFA) 108 (90 Base) MCG/ACT inhaler Inhale 2 puffs into the lungs every 4 (four) hours as needed for wheezing or shortness of breath. 05/18/20   Sherryll Burger, Pratik D, DO  apixaban (ELIQUIS) 5 MG TABS tablet Take 1 tablet (5 mg total) by mouth 2 (two) times daily. 06/14/22   Elgergawy, Leana Roe, MD  atorvastatin (LIPITOR) 40 MG tablet Take 40 mg by mouth daily.    [provider]  budesonide-formoterol (SYMBICORT) 160-4.5 MCG/ACT inhaler Inhale 2 puffs into the lungs 2 (two) times daily as needed (for shortness of breath).     [provider]  calcium-vitamin D (OSCAL 500/200 D-3) 500-200 MG-UNIT tablet Take 1 tablet by mouth 2 (two) times daily. 02/02/17   [name], MD  colchicine 0.6 MG tablet Take 0.6 mg by mouth daily as needed (for gout flare/pain).     [provider]  Cyanocobalamin (B-12) 5000 MCG CAPS Take 5,000 mcg by  mouth daily.     [provider]  furosemide (LASIX) 20 MG tablet Take 20 mg by mouth daily as needed.     [provider]  Insulin Pen Needle 32G X 4 MM MISC Use to inject insulin. 06/14/22   Elgergawy, Leana Roe, MD  levothyroxine (SYNTHROID) 125 MCG tablet Take 2 tablets (250 mcg total) by mouth daily at 6 (six) AM. 06/15/22   Elgergawy, Leana Roe, MD  metoprolol tartrate (LOPRESSOR) 25 MG tablet Take 1 tablet (25 mg total) by mouth 2 (two) times daily. 06/14/22   Elgergawy, Leana Roe, MD  Multiple Vitamin (MULTIVITAMIN WITH MINERALS) TABS tablet Take 1 tablet by mouth daily.    [provider]  oxyCODONE (OXY IR/ROXICODONE) 5 MG immediate release tablet Take 1 tablet (5 mg total) by mouth every 6 (six) hours as  needed for moderate pain. 10/21/22   Juliet Rude, PA-C  sodium bicarbonate 650 MG tablet Take 1 tablet (650 mg total) by mouth daily. 06/14/22   Elgergawy, Leana Roe, MD  vitamin C (ASCORBIC ACID) 500 MG tablet Take 500 mg by mouth daily.    [provider]      Allergies    Nabumetone    Review of Systems   Review of Systems  Constitutional:  Positive for diaphoresis and fatigue.  Gastrointestinal:  Negative for vomiting.  Neurological:  Positive for light-headedness. Negative for seizures, syncope and speech difficulty.  Psychiatric/Behavioral:  Negative for confusion.     Physical Exam Updated Vital Signs BP (!) 149/78   Pulse 83   Temp 98.9 F (37.2 C) (Oral)   Resp 14   Ht 1.93 m (6\' 4" )   Wt (!) 145.2 kg   SpO2 94%   BMI 38.95 kg/m  Physical Exam CONSTITUTIONAL: Well developed/well nourished HEAD: Normocephalic/atraumatic EYES: EOMI/PERRL, no nystagmus, no ptosis ENMT: Mucous membranes moist NECK: supple no meningeal signs CV: S1/S2 noted, no murmurs/rubs/gallops noted LUNGS: Lungs are clear to auscultation bilaterally, no apparent distress ABDOMEN: soft, nontender, no rebound or guarding GU:no cva tenderness NEURO:Awake/alert, face symmetric, no arm or leg drift is noted Equal 5/5 strength with shoulder abduction, elbow flex/extension, wrist flex/extension in upper extremities and equal hand grips bilaterally Equal 5/5 strength with hip flexion,knee flex/extension, foot dorsi/plantar flexion Cranial nerves 3/4/5/6/11/21/08/11/12 tested and intact Sensation to light touch intact in all extremities EXTREMITIES: pulses normalx4, full ROM Patient is exquisitely tender to his right upper back and around in his right chest wall.  There is no bruising, no erythema, no rash SKIN: warm, color normal PSYCH: no abnormalities of mood noted  ED Results / Procedures / Treatments   Labs (all labs ordered are listed, but only abnormal results are displayed) Labs  Reviewed  BASIC METABOLIC PANEL - Abnormal; Notable for the following components:      Result Value   CO2 19 (*)    Glucose, Bld 219 (*)    BUN 40 (*)    Creatinine, Ser 1.81 (*)    GFR, Estimated 41 (*)    All other components within normal limits  CBC - Abnormal; Notable for the following components:   WBC 10.8 (*)    All other components within normal limits  CK  MAGNESIUM  TROPONIN I (HIGH SENSITIVITY)    EKG EKG Interpretation Date/Time:  Saturday December 24 2022 22:54:35 EDT Ventricular Rate:  94 PR Interval:  198 QRS Duration:  86 QT Interval:  388 QTC Calculation: 485 R Axis:   -2  Text Interpretation:  Normal sinus rhythm Cannot rule out Inferior infarct , age undetermined Abnormal ECG Confirmed by Zadie Rhine [phone]) on 12/24/2022 11:46:24 PM  Radiology DG Chest 2 View  Result Date: 12/24/2022 CLINICAL DATA:  chest pain EXAM: CHEST - 2 VIEW COMPARISON:  Chest x-ray 10/16/2022, CT chest 10/16/2022 FINDINGS: Enlarged cardiac silhouette. The heart and mediastinal contours are unchanged. No focal consolidation. No pulmonary edema. No pleural effusion. No pneumothorax. No acute osseous abnormality.  Vascular clips overlie the neck. IMPRESSION: No active cardiopulmonary disease. Electronically Signed   By: Tish Frederickson M.D.   On: 12/24/2022 23:55    Procedures Procedures    Medications Ordered in ED Medications  sodium chloride 0.9 % bolus 1,000 mL (0 mLs Intravenous Stopped 12/25/22 0138)    ED Course/ Medical Decision Making/ A&P Clinical Course as of 12/25/22 0227  Sun Dec 25, 2022  0038 Creatinine(!): 1.81 Renal insufficiency [DW]  0039 Glucose(!): 219 Hyperglycemia [DW]  0039 WBC(!): 10.8 Mild leukocytosis [DW]  0226 Patient presented for multiple complaints.  His main concern was this pain in his right flank into his right and left chest has had for months.  This has been unchanged.  Patient is very sensitive to palpation of the back and chest wall.   No emergent cause found, he can follow-up as an outpatient for this [DW]  0227 Patient also reports he was working outside all day today and felt dehydrated.  He then felt lightheaded and diaphoretic.  No chest pain was reported.  Suspect he had mild heat illness.  Patient is improved with IV fluids.  He ambulated with his walker without difficulty.  Will discharge home [DW]    Clinical Course User Index [DW] Zadie Rhine, MD                                 Medical Decision Making Amount and/or Complexity of Data Reviewed Labs: ordered. Decision-making details documented in ED Course. Radiology: ordered.   This patient presents to the ED for concern of chest pain, this involves an extensive number of treatment options, and is a complaint that carries with it a high risk of complications and morbidity.  The differential diagnosis includes but is not limited to acute coronary syndrome, aortic dissection, pulmonary embolism, pericarditis, pneumothorax, pneumonia, myocarditis, pleurisy, esophageal rupture    Comorbidities that complicate the patient evaluation: Patient's presentation is complicated by their history of cardiac arrest, pulmonary embolism  Social Determinants of Health: Patient's  reported chronic memory loss   increases the complexity of managing their presentation  Additional history obtained: Additional history obtained from family Records reviewed previous admission documents  Lab Tests: I Ordered, and personally interpreted labs.  The pertinent results include: Renal insufficiency  Imaging Studies ordered: I ordered imaging studies including X-ray chest   I independently visualized and interpreted imaging which showed no acute findings I agree with the radiologist interpretation  Cardiac Monitoring: The patient was maintained on a cardiac monitor.  I personally viewed and interpreted the cardiac monitor which showed an underlying rhythm of:  sinus  rhythm  Medicines ordered and prescription drug management: I ordered medication including IV fluids for presumed dehydration Reevaluation of the patient after these medicines showed that the patient    improved  Test Considered: I considered CT chest to evaluate for PE or dissection.  However patient reports that his right chest and back pain has been present for months I have low  suspicion for PE or dissection at this time  Critical Interventions:   IV fluids   Reevaluation: After the interventions noted above, I reevaluated the patient and found that they have :improved  Complexity of problems addressed: Patient's presentation is most consistent with  acute presentation with potential threat to life or bodily function  Disposition: After consideration of the diagnostic results and the patient's response to treatment,  I feel that the patent would benefit from discharge   .           Final Clinical Impression(s) / ED Diagnoses Final diagnoses:  Dizziness    Rx / DC Orders ED Discharge Orders     None         Zadie Rhine, MD 12/25/22 [phone]

## 2022-12-25 NOTE — Discharge Instructions (Signed)

## 2022-12-25 NOTE — ED Notes (Signed)
Discharge instructions provided by edp were reinforced to pt. Pt verbalized understanding with no additional questions at this time. Pt ambulatory at time of discharge with personal walker. Pt going home with son at bedside.

## 2022-12-25 NOTE — ED Notes (Signed)
Ambulated pt per EDP request. Pt ambulated in the hall with his personal walker from home. Pt gait steady. Denied dizziness/lightheaded at time of ambulation. Upon returning to the room pt's HR 74, RR 22, and 98% room air. Sts its normal for him to feel "winded" with ambulation. When pt adjusted himself from sitting position into the stretcher pt c/o feeling dizzy. Pt HR 82 NSR at that time. Is now sitting comfortably on cell phone. EDP Wickline updated regarding findings.

## 2023-07-31 ENCOUNTER — Other Ambulatory Visit: Payer: Self-pay

## 2023-07-31 ENCOUNTER — Emergency Department (HOSPITAL_COMMUNITY)

## 2023-07-31 ENCOUNTER — Encounter (HOSPITAL_COMMUNITY): Payer: Self-pay

## 2023-07-31 ENCOUNTER — Emergency Department (HOSPITAL_COMMUNITY)
Admission: EM | Admit: 2023-07-31 | Discharge: 2023-07-31 | Disposition: A | Discharge status: Home / Self Care | Attending: Emergency Medicine | Admitting: Emergency Medicine

## 2023-07-31 DIAGNOSIS — Z794 Long term (current) use of insulin: Secondary | ICD-10-CM | POA: Diagnosis not present

## 2023-07-31 DIAGNOSIS — Z7901 Long term (current) use of anticoagulants: Secondary | ICD-10-CM | POA: Diagnosis not present

## 2023-07-31 DIAGNOSIS — E119 Type 2 diabetes mellitus without complications: Secondary | ICD-10-CM | POA: Insufficient documentation

## 2023-07-31 DIAGNOSIS — N23 Unspecified renal colic: Secondary | ICD-10-CM | POA: Insufficient documentation

## 2023-07-31 DIAGNOSIS — R109 Unspecified abdominal pain: Secondary | ICD-10-CM | POA: Diagnosis present

## 2023-07-31 LAB — COMPREHENSIVE METABOLIC PANEL
ALT: 20 U/L (ref 0–44)
AST: 26 U/L (ref 15–41)
Albumin: 3.8 g/dL (ref 3.5–5.0)
Alkaline Phosphatase: 71 U/L (ref 38–126)
Anion gap: 17 — ABNORMAL HIGH (ref 5–15)
BUN: 29 mg/dL — ABNORMAL HIGH (ref 8–23)
CO2: 17 mmol/L — ABNORMAL LOW (ref 22–32)
Calcium: 9.4 mg/dL (ref 8.9–10.3)
Chloride: 109 mmol/L (ref 98–111)
Creatinine, Ser: 1.57 mg/dL — ABNORMAL HIGH (ref 0.61–1.24)
GFR, Estimated: 49 mL/min — ABNORMAL LOW (ref >60)
Glucose, Bld: 184 mg/dL — ABNORMAL HIGH (ref 70–99)
Potassium: 4.3 mmol/L (ref 3.5–5.1)
Sodium: 143 mmol/L (ref 135–145)
Total Bilirubin: 0.6 mg/dL (ref 0.0–1.2)
Total Protein: 7.2 g/dL (ref 6.5–8.1)

## 2023-07-31 LAB — URINALYSIS, ROUTINE W REFLEX MICROSCOPIC
Bacteria, UA: NONE SEEN
Bilirubin Urine: NEGATIVE
Glucose, UA: >=500 mg/dL — AB
Ketones, ur: NEGATIVE mg/dL
Leukocytes,Ua: NEGATIVE
Nitrite: NEGATIVE
Protein, ur: 30 mg/dL — AB
RBC / HPF: >50 RBC/hpf (ref 0–5)
Specific Gravity, Urine: 1.028 (ref 1.005–1.030)
pH: 6 (ref 5.0–8.0)

## 2023-07-31 LAB — LIPASE, BLOOD: Lipase: 38 U/L (ref 11–51)

## 2023-07-31 LAB — CBC
HCT: 43.3 % (ref 39.0–52.0)
Hemoglobin: 14.7 g/dL (ref 13.0–17.0)
MCH: 32.2 pg (ref 26.0–34.0)
MCHC: 33.9 g/dL (ref 30.0–36.0)
MCV: 94.7 fL (ref 80.0–100.0)
Platelets: 190 10*3/uL (ref 150–400)
RBC: 4.57 MIL/uL (ref 4.22–5.81)
RDW: 13.9 % (ref 11.5–15.5)
WBC: 9.4 10*3/uL (ref 4.0–10.5)
nRBC: 0 % (ref 0.0–0.2)

## 2023-07-31 MED ORDER — KETOROLAC TROMETHAMINE 10 MG PO TABS: 10.0000 mg | ORAL_TABLET | Freq: Four times a day (QID) | ORAL | 0 refills | Status: AC | PRN

## 2023-07-31 MED ORDER — OXYCODONE-ACETAMINOPHEN 5-325 MG PO TABS
1.0000 | ORAL_TABLET | Freq: Once | ORAL | Status: AC
  Administered 2023-07-31: 1 via ORAL
  Filled 2023-07-31: qty 1

## 2023-07-31 MED ORDER — MORPHINE SULFATE (PF) 2 MG/ML IV SOLN
2.0000 mg | Freq: Once | INTRAVENOUS | Status: AC
  Administered 2023-07-31: 2 mg via INTRAVENOUS
  Filled 2023-07-31: qty 1

## 2023-07-31 NOTE — Discharge Instructions (Addendum)
 You are seen in the emergency department today for concerns of abdominal pain.  Your labs and imaging were thankfully largely reassuring although there does appear to be findings of some stones present in the right kidney.  These may be causing some discomfort but not as likely as they are not in your ureter or in your bladder.  Your CT scan also did not show findings of a seroma in the area where you had your gallbladder removed.

## 2023-07-31 NOTE — ED Provider Notes (Signed)
 [place] EMERGENCY DEPARTMENT AT [facility] Provider Note   CSN: [id number] Arrival date & time: 07/31/23  1504     History Chief Complaint  Patient presents with   Abdominal Pain    Frank Crystal. is a 63 y.o. male.  Patient with past history significant for type 2 diabetes, PE, respiratory failure, morbid obesity presents to the ED today with concerns of abdominal pain.  He reports that he began to experience right flank tenderness with radiation towards the right side of his abdomen starting about 3 days ago.  Endorses last bowel movement was 4 days ago.  Still passing gas.  Denies any vomiting but does have some mild nausea.   Abdominal Pain      Home Medications Prior to Admission medications   Medication Sig Start Date End Date Taking? Authorizing Provider  ketorolac (TORADOL) 10 MG tablet Take 1 tablet (10 mg total) by mouth every 6 (six) hours as needed. 07/31/23  Yes Smitty Knudsen, PA-C  acetaminophen (TYLENOL) 325 MG tablet Take 650 mg by mouth every 6 (six) hours as needed for moderate pain or headache.    [provider]  albuterol (VENTOLIN HFA) 108 (90 Base) MCG/ACT inhaler Inhale 2 puffs into the lungs every 4 (four) hours as needed for wheezing or shortness of breath. 05/18/20   Sherryll Burger, Pratik D, DO  apixaban (ELIQUIS) 5 MG TABS tablet Take 1 tablet (5 mg total) by mouth 2 (two) times daily. 06/14/22   Elgergawy, Leana Roe, MD  atorvastatin (LIPITOR) 40 MG tablet Take 40 mg by mouth daily.    [provider]  budesonide-formoterol (SYMBICORT) 160-4.5 MCG/ACT inhaler Inhale 2 puffs into the lungs 2 (two) times daily as needed (for shortness of breath).     [provider]  calcium-vitamin D (OSCAL 500/200 D-3) 500-200 MG-UNIT tablet Take 1 tablet by mouth 2 (two) times daily. 02/02/17   Frank Macho, MD  colchicine 0.6 MG tablet Take 0.6 mg by mouth daily as needed (for gout flare/pain).     [provider]   Cyanocobalamin (B-12) 5000 MCG CAPS Take 5,000 mcg by mouth daily.     [provider]  furosemide (LASIX) 20 MG tablet Take 20 mg by mouth daily as needed.     [provider]  Insulin Pen Needle 32G X 4 MM MISC Use to inject insulin. 06/14/22   Elgergawy, Leana Roe, MD  levothyroxine (SYNTHROID) 125 MCG tablet Take 2 tablets (250 mcg total) by mouth daily at 6 (six) AM. 06/15/22   Elgergawy, Leana Roe, MD  metoprolol tartrate (LOPRESSOR) 25 MG tablet Take 1 tablet (25 mg total) by mouth 2 (two) times daily. 06/14/22   Elgergawy, Leana Roe, MD  Multiple Vitamin (MULTIVITAMIN WITH MINERALS) TABS tablet Take 1 tablet by mouth daily.    [provider]  oxyCODONE (OXY IR/ROXICODONE) 5 MG immediate release tablet Take 1 tablet (5 mg total) by mouth every 6 (six) hours as needed for moderate pain. 10/21/22   Juliet Rude, PA-C  sodium bicarbonate 650 MG tablet Take 1 tablet (650 mg total) by mouth daily. 06/14/22   Elgergawy, Leana Roe, MD  vitamin C (ASCORBIC ACID) 500 MG tablet Take 500 mg by mouth daily.    [provider]      Allergies    Nabumetone    Review of Systems   Review of Systems  Gastrointestinal:  Positive for abdominal pain.  All other systems reviewed and are negative.  Physical Exam Updated Vital Signs BP (!) 141/66   Pulse (!) 57   Temp (!) 97.5 F (36.4 C) (Oral)   Resp 18   Ht 6\' 4"  (1.93 m)   Wt (!) 146 kg   SpO2 99%   BMI 39.18 kg/m  Physical Exam Vitals and nursing note reviewed.  Constitutional:      General: He is not in acute distress.    Appearance: He is well-developed.  HENT:     Head: Normocephalic and atraumatic.  Eyes:     Conjunctiva/sclera: Conjunctivae normal.  Cardiovascular:     Rate and Rhythm: Normal rate and regular rhythm.     Heart sounds: No murmur heard. Pulmonary:     Effort: Pulmonary effort is normal. No respiratory distress.     Breath sounds: Normal breath sounds.  Abdominal:      Palpations: Abdomen is soft.     Tenderness: There is no abdominal tenderness. There is right CVA tenderness. There is no guarding. Negative signs include Murphy's sign.  Musculoskeletal:        General: No swelling.     Cervical back: Neck supple.       Back:     Comments: Some midline TTP. No step off deformity.  Skin:    General: Skin is warm and dry.     Capillary Refill: Capillary refill takes less than 2 seconds.  Neurological:     Mental Status: He is alert.  Psychiatric:        Mood and Affect: Mood normal.     ED Results / Procedures / Treatments   Labs (all labs ordered are listed, but only abnormal results are displayed) Labs Reviewed  COMPREHENSIVE METABOLIC PANEL - Abnormal; Notable for the following components:      Result Value   CO2 17 (*)    Glucose, Bld 184 (*)    BUN 29 (*)    Creatinine, Ser 1.57 (*)    GFR, Estimated 49 (*)    Anion gap 17 (*)    All other components within normal limits  URINALYSIS, ROUTINE W REFLEX MICROSCOPIC - Abnormal; Notable for the following components:   Glucose, UA >=500 (*)    Hgb urine dipstick MODERATE (*)    Protein, ur 30 (*)    All other components within normal limits  LIPASE, BLOOD  CBC    EKG None  Radiology CT L-SPINE NO CHARGE Result Date: 07/31/2023 CLINICAL DATA:  Low back pain, flank pain radiating to groin EXAM: CT Lumbar Spine without contrast TECHNIQUE: Technique: Multiplanar CT images of the lumbar spine were reconstructed from contemporary CT of the Abdomen and Pelvis. RADIATION DOSE REDUCTION: This exam was performed according to the departmental dose-optimization program which includes automated exposure control, adjustment of the mA and/or kV according to patient size and/or use of iterative reconstruction technique. CONTRAST:  None COMPARISON:  11/08/2021 FINDINGS: Segmentation: 5 lumbar type vertebrae. Alignment: Normal. Vertebrae: There are no acute or destructive bony abnormalities. Paraspinal and  other soft tissues: The paraspinal soft tissues are unremarkable. Please refer to separately reported CT abdomen and pelvis exam for retroperitoneal and intraperitoneal findings. Disc levels: Findings at individual levels are as follows: L1/L2: Mild circumferential disc bulge and bilateral facet hypertrophy without significant compressive sequela. L2/L3: Mild circumferential disc bulge and bilateral facet hypertrophy without significant compressive sequela. L3/L4: Mild circumferential disc bulge with bilateral facet and ligamentum flavum hypertrophy. No significant compressive sequela. L4/L5: Circumferential disc bulge with bilateral facet and ligamentum flavum hypertrophy.  There is mild central canal stenosis, with symmetrical bilateral neural foraminal encroachment. L5/S1: Circumferential disc bulge and bilateral facet hypertrophy, resulting in moderate symmetrical bilateral neural foraminal encroachment. Reconstructed images demonstrate no additional findings. IMPRESSION: 1. No acute lumbar spine fracture. 2. Multilevel spondylosis and facet hypertrophy, greatest at L4-5 and L5-S1. 3. Please refer to separately reported CT abdomen and pelvis exam for retroperitoneal and intraperitoneal findings. Electronically Signed   By: Sharlet Salina M.D.   On: 07/31/2023 18:03   CT Renal Stone Study Result Date: 07/31/2023 CLINICAL DATA:  Lower abdominal and back pain, flank pain radiating to inguinal region EXAM: CT ABDOMEN AND PELVIS WITHOUT CONTRAST TECHNIQUE: Multidetector CT imaging of the abdomen and pelvis was performed following the standard protocol without IV contrast. RADIATION DOSE REDUCTION: This exam was performed according to the departmental dose-optimization program which includes automated exposure control, adjustment of the mA and/or kV according to patient size and/or use of iterative reconstruction technique. COMPARISON:  11/08/2021 FINDINGS: Lower chest: No acute pleural or parenchymal lung disease.  Hepatobiliary: Postsurgical change from cholecystectomy. There is a 2.7 x 2.3 cm simple appearing fluid collection adjacent to the cholecystectomy clips, likely postoperative seroma. Unremarkable unenhanced appearance of the liver. No biliary duct dilation. Pancreas: Unremarkable unenhanced appearance. Spleen: Stable unremarkable unenhanced appearance. Adrenals/Urinary Tract: Bilateral nonobstructing renal calculi are again identified. There are least 5 distinct right-sided calculi, largest in the right renal pelvis measuring 17 mm. A single left renal calculus is identified, measuring 5 mm. No obstructive uropathy within either kidney. The adrenals and bladder are unremarkable. Stomach/Bowel: No bowel obstruction or ileus. Normal appendix right lower quadrant. Scattered colonic diverticulosis without diverticulitis. Small hiatal hernia. No bowel wall thickening or inflammatory change. Vascular/Lymphatic: Stable aortic atherosclerosis. Stable IVC filter. No pathologic adenopathy. Reproductive: Prostate is unremarkable. Other: No free fluid or free intraperitoneal gas. No abdominal wall hernia. Musculoskeletal: No acute or destructive bony abnormalities. Reconstructed images demonstrate no additional findings. IMPRESSION: 1. Bilateral nonobstructing renal calculi. No obstructive uropathy within either kidney. 2. Diffuse colonic diverticulosis without diverticulitis. 3. Interval cholecystectomy, with 2.7 cm simple appearing fluid collection adjacent to the cholecystectomy clips likely reflecting postoperative seroma. 4.  Aortic Atherosclerosis (ICD10-I70.0). 5. Small hiatal hernia. Electronically Signed   By: Sharlet Salina M.D.   On: 07/31/2023 17:59    Procedures Procedures    Medications Ordered in ED Medications  oxyCODONE-acetaminophen (PERCOCET/ROXICET) 5-325 MG per tablet 1 tablet (1 tablet Oral Given 07/31/23 1753)  morphine (PF) 2 MG/ML injection 2 mg (2 mg Intravenous Given 07/31/23 1944)    ED  Course/ Medical Decision Making/ A&P                                 Medical Decision Making Amount and/or Complexity of Data Reviewed Labs: ordered.  Risk Prescription drug management.   This patient presents to the ED for concern of abdominal pain. Differential diagnosis includes diverticulitis, bowel obstruction, urolithiasis, lumbar radiculopathy.   Lab Tests:  I Ordered, and personally interpreted labs.  The pertinent results include: Urinalysis with some concerns for moderate hemoglobin seen, glucosuria also noted, mild proteinuria with protein count of 30.  CBC unremarkable, CMP with some evidence of renal impairment but at baseline, lipase negative   Imaging Studies ordered:  I ordered imaging studies including CT renal, CT L-spine I independently visualized and interpreted imaging which showed 1. Bilateral nonobstructing renal calculi. No obstructive uropathy within either kidney. 2. Diffuse  colonic diverticulosis without diverticulitis. 3. Interval cholecystectomy, with 2.7 cm simple appearing fluid collection adjacent to the cholecystectomy clips likely reflecting postoperative seroma. 4.  Aortic Atherosclerosis (ICD10-I70.0). 5. Small hiatal hernia.1. No acute lumbar spine fracture. 2. Multilevel spondylosis and facet hypertrophy, greatest at L4-5 and L5-S1. I agree with the radiologist interpretation   Medicines ordered and prescription drug management:  I ordered medication including Percocet, morphine for pain Reevaluation of the patient after these medicines showed that the patient improved I have reviewed the patients home medicines and have made adjustments as needed   Problem List / ED Course:  Patient presents to the emergency department today with concerns of abdominal pain.  He reports that has been experiencing some midline tenderness to palpation as well as right CVA tenderness for the last several days.  Past history significant for type 2 diabetes, PE,  respiratory failure, morbid obesity.  He states that he has had previously some blood in his urine but this appears to be clearing up.  Denies any significant dysuria or increased urinary frequency or urgency.  Does endorse a prior history of kidney stones.  Has previously been seen by urology. Physical exam does reveal right CVA tenderness.  Given this finding, will obtain CT imaging to rule out possible kidney stone.  Will also obtain CT lumbar imaging to assess for any possible acute on chronic worsening back pain.  Patient labs also be obtained to rule out any case of severe infection or other findings.  Patient given a dose of Percocet for pain control.  Will reassess shortly. On reassessment, patient still endorsing some pain.  Lab work is unremarkable at this time with no acute finding seen that are highly concerning.  There are some hemoglobin in the urine so would not be surprised if there is a stone on CT imaging.  Patient given a dose of morphine for further pain management. CT imaging is thankfully reassuring although there does appear to be bilateral nonobstructing renal calculi present.  Given these findings, patient may be endorsing symptoms consistent with renal colic.  There is no acute evidence of infection at this time so do not feel the patient requires antibiotic therapy.  Instead, advised patient's symptom management is likely the indication at this time and he should follow-up with urology if his symptoms or not improving.  Did advise strict return precautions such as new or worsening symptoms.  Patient is otherwise stable with no other acute or focal concerns.  Patient discharged home with plans for outpatient follow-up.    Final Clinical Impression(s) / ED Diagnoses Final diagnoses:  Renal colic on right side    Rx / DC Orders ED Discharge Orders          Ordered    ketorolac (TORADOL) 10 MG tablet  Every 6 hours PRN        07/31/23 1944              Frank Shelton 07/31/23 2325    Frank Hong, MD 08/02/23 1758

## 2023-07-31 NOTE — ED Triage Notes (Signed)
 Pt arrived via POV c/o lower back pain, and flank pain that radiates towards his groin. Pt also reports last BM was 4 days ago.

## 2023-10-03 ENCOUNTER — Ambulatory Visit: Attending: Internal Medicine | Admitting: Internal Medicine

## 2023-10-03 ENCOUNTER — Encounter: Payer: Self-pay | Admitting: Internal Medicine

## 2023-10-03 VITALS — BP 118/70 | HR 67 | Ht 76.0 in | Wt 348.2 lb

## 2023-10-03 DIAGNOSIS — Z86711 Personal history of pulmonary embolism: Secondary | ICD-10-CM | POA: Diagnosis not present

## 2023-10-03 DIAGNOSIS — Z136 Encounter for screening for cardiovascular disorders: Secondary | ICD-10-CM | POA: Diagnosis not present

## 2023-10-03 DIAGNOSIS — Z01810 Encounter for preprocedural cardiovascular examination: Secondary | ICD-10-CM | POA: Insufficient documentation

## 2023-10-03 DIAGNOSIS — I872 Venous insufficiency (chronic) (peripheral): Secondary | ICD-10-CM | POA: Insufficient documentation

## 2023-10-03 NOTE — Patient Instructions (Addendum)

## 2023-10-03 NOTE — Progress Notes (Signed)
 Cardiology Office Note  Date: 10/03/2023   ID: Frank Engram., DOB 05-26-1960, MRN [id number]  PCP:  Susana Enter, FNP  Cardiologist:  Lasalle Pointer, MD Electrophysiologist:  None   History of Present Illness: Frank Gucciardo. is a 62 y.o. male  Chronic stable DOE no recent worsening.  No angina.  No dizziness, palpitations.  He does have chronic venous insufficiency and has leg swelling from that.  He also has discoloration in bilateral lower EXTR.  He has history of PE and DVT.  He did have massive PE resulting in cardiac arrest couple of years ago.  He also underwent laparoscopic cholecystectomy in 2024 June, no postop complications.  He tolerated the procedure very well.  Cardiac referral is placed now for preop cardiac stratification for lithotripsy of renal calculi and stent placement.  Past Medical History:  Diagnosis Date   Arthritis    L shoulder & fingers    Bronchitis    during enviromental changes    Complication of anesthesia    Report of airway problem   Diabetes mellitus without complication (HCC)    Dyspnea    Embolism - blood clot    Gout    Headache    uses ibuprofen  & tylenol  & its relieved    Hypertension    Hypothyroidism    Myocardial infarction (HCC) 2012   OSA on CPAP    Peripheral vascular disease (HCC)    Pulmonary embolism (HCC)    Stroke (HCC) 2014    Past Surgical History:  Procedure Laterality Date   CHOLECYSTECTOMY N/A 10/20/2022   Procedure: LAPAROSCOPIC CHOLECYSTECTOMY;  Surgeon: Frank Olds, MD;  Location: [facility] OR;  Service: General;  Laterality: N/A;   HELLER MYOTOMY  2014   [place]   IVC FILTER INSERTION  2010   ROTATOR CUFF REPAIR Right    SKIN GRAFT     x3- to face., child    THYROIDECTOMY N/A 03/09/2017   Procedure: TOTAL THYROIDECTOMY;  Surgeon: Oralee Billow, MD;  Location: [facility] OR;  Service: General;  Laterality: N/A;   TOTAL THYROIDECTOMY  03/09/2017    Current Outpatient Medications  Medication  Sig Dispense Refill   acetaminophen  (TYLENOL ) 325 MG tablet Take 650 mg by mouth every 6 (six) hours as needed for moderate pain or headache.     albuterol  (VENTOLIN  HFA) 108 (90 Base) MCG/ACT inhaler Inhale 2 puffs into the lungs every 4 (four) hours as needed for wheezing or shortness of breath. 8 g 2   apixaban  (ELIQUIS ) 5 MG TABS tablet Take 1 tablet (5 mg total) by mouth 2 (two) times daily. 60 tablet 0   atorvastatin  (LIPITOR) 40 MG tablet Take 40 mg by mouth daily.     budesonide -formoterol  (SYMBICORT ) 160-4.5 MCG/ACT inhaler Inhale 2 puffs into the lungs 2 (two) times daily as needed (for shortness of breath).      calcium -vitamin D  (OSCAL 500/200 D-3) 500-200 MG-UNIT tablet Take 1 tablet by mouth 2 (two) times daily. 60 tablet 1   Cholecalciferol  (VITAMIN D3) 125 MCG (5000 UT) CAPS Take by mouth 2 (two) times daily.     colchicine  0.6 MG tablet Take 0.6 mg by mouth daily as needed (for gout flare/pain).      FARXIGA 5 MG TABS tablet Take 5 mg by mouth daily.     furosemide  (LASIX ) 20 MG tablet Take 20 mg by mouth daily as needed.      gabapentin  (NEURONTIN ) 100 MG capsule Take 100  mg by mouth daily.     Insulin  Pen Needle 32G X 4 MM MISC Use to inject insulin . 100 each 0   ketorolac  (TORADOL ) 10 MG tablet Take 1 tablet (10 mg total) by mouth every 6 (six) hours as needed. 20 tablet 0   LANTUS  SOLOSTAR 100 UNIT/ML Solostar Pen Inject 25 Units into the skin daily.     levothyroxine  (SYNTHROID ) 125 MCG tablet Take 2 tablets (250 mcg total) by mouth daily at 6 (six) AM. 60 tablet 0   LINZESS 145 MCG CAPS capsule Take 145 mcg by mouth daily.     metoprolol  tartrate (LOPRESSOR ) 25 MG tablet Take 1 tablet (25 mg total) by mouth 2 (two) times daily. 60 tablet 0   Multiple Vitamin (MULTIVITAMIN WITH MINERALS) TABS tablet Take 1 tablet by mouth daily.     oxyCODONE  (OXY IR/ROXICODONE ) 5 MG immediate release tablet Take 1 tablet (5 mg total) by mouth every 6 (six) hours as needed for moderate pain.  15 tablet 0   No current facility-administered medications for this visit.   Allergies:  Nabumetone   Social History: The patient  reports that he quit smoking about 17 years ago. His smoking use included cigarettes. He started smoking about 53 years ago. He has a 36 pack-year smoking history. He has quit using smokeless tobacco. He reports that he does not currently use alcohol. He reports that he does not use drugs.   Family History: The patient's family history includes Heart attack in his maternal grandfather, maternal grandmother, and mother; Heart attack (age of onset: 40) in his sister.   ROS:  Please see the history of present illness. Otherwise, complete review of systems is positive for none  All other systems are reviewed and negative.   Physical Exam: VS:  BP 118/70   Pulse 67   Ht 6\' 4"  (1.93 m)   Wt (!) 348 lb 3.2 oz (157.9 kg)   SpO2 99%   BMI 42.38 kg/m , BMI Body mass index is 42.38 kg/m.  Wt Readings from Last 3 Encounters:  10/03/23 (!) 348 lb 3.2 oz (157.9 kg)  07/31/23 (!) 321 lb 14 oz (146 kg)  12/24/22 (!) 320 lb (145.2 kg)    General: Patient appears comfortable at rest. HEENT: Conjunctiva and lids normal, oropharynx clear with moist mucosa. Neck: Supple, no elevated JVP or carotid bruits, no thyromegaly. Lungs: Clear to auscultation, nonlabored breathing at rest. Cardiac: Regular rate and rhythm, no S3 or significant systolic murmur, no pericardial rub. Abdomen: Soft, nontender, no hepatomegaly, bowel sounds present, no guarding or rebound. Extremities: No pitting edema, distal pulses 2+. Skin: Warm and dry. Musculoskeletal: No kyphosis. Neuropsychiatric: Alert and oriented x3, affect grossly appropriate.  Recent Labwork: 12/24/2022: Magnesium  2.1 07/31/2023: ALT 20; AST 26; BUN 29; Creatinine, Ser 1.57; Hemoglobin 14.7; Platelets 190; Potassium 4.3; Sodium 143     Component Value Date/Time   CHOL 91 01/10/2014 0443   TRIG 306 (H) 05/15/2022 0353    HDL 25 (L) 01/10/2014 0443   CHOLHDL 3.6 01/10/2014 0443   VLDL 39 01/10/2014 0443   LDLCALC 27 01/10/2014 0443     Assessment and Plan:   Preop cardiac risk stratification for lithotripsy of renal calculi and stent placement: He does have chronic stable DOE but his diastology was normal.  No angina.  EKG showed NSR with first-degree AV block.  Echocardiogram in 2024 showed normal LVEF, normal diastology, normal RV function, no valvular heart disease. Patient underwent laparoscopic cholecystectomy procedure in June  2024 with postop complications.  He tolerated the procedure very well.  He is at a low risk for any perioperative cardiac complications, no further cardiac workup required at this time.  He has a chronic history of PE/DVT, okay to hold Eliquis  for 2 days prior to procedure and can resume within 24 hours.  History of PE/DVT: Continue Eliquis  5 mg twice daily and preop recommendations as above.  Chronic venous insufficiency: Compression socks.      Medication Adjustments/Labs and Tests Ordered: Current medicines are reviewed at length with the patient today.  Concerns regarding medicines are outlined above.    Disposition:  Follow up prn  Signed Frank Braatz Beauford Bounds, MD, 10/03/2023 2:24 PM    [facility] [address] [place], [place], [place] [address]

## 2023-10-05 ENCOUNTER — Telehealth: Payer: Self-pay | Admitting: Internal Medicine

## 2023-10-05 NOTE — Telephone Encounter (Signed)
   Pre-operative Risk Assessment    Patient Name: Frank Shelton.  DOB: February 11, 1961 MRN: [id number]      Request for Surgical Clearance    Procedure:  Stent Placement/ Lithotripsy  Date of Surgery:  Clearance 10/10/23                                 Surgeon:  Carlye Child Surgeon's Group or Practice Name:  [facility] Phone number:  [phone] Fax number:  [phone]   Type of Clearance Requested:   - Medical    Type of Anesthesia:  Not Indicated   Additional requests/questions:  Please fax a copy of Clearance and recent EKG to the surgeon's office.  [name]   10/05/2023, 12:47 PM

## 2023-10-05 NOTE — Telephone Encounter (Signed)
   Patient Name: Frank Shelton.  DOB: 06/22/1960 MRN: [id number]  Primary Cardiologist: Lasalle Pointer, MD  Chart reviewed as part of pre-operative protocol coverage. Given past medical history and time since last visit, based on ACC/AHA guidelines, Frank Shelton. is at acceptable risk for the planned procedure without further cardiovascular testing.   The patient was advised that if he develops new symptoms prior to surgery to contact our office to arrange for a follow-up visit, and he verbalized understanding.  Per Dr. Mallipeddi, he has a chronic history of PE/DVT, okay to hold Eliquis  for 2 days prior to procedure and can resume within 24 hours.   I will route this recommendation to the requesting party via Epic fax function and remove from pre-op pool.  Please call with questions.  Frank Shelton, Frank Cast, NP 10/05/2023, 1:02 PM
